# Patient Record
Sex: Male | Born: 1970
Health system: Southern US, Community
[De-identification: ages and names within clinical notes are randomized; demographics above are authoritative.]

## PROBLEM LIST (undated history)

## (undated) DIAGNOSIS — F71 Moderate intellectual disabilities: Secondary | ICD-10-CM

## (undated) DIAGNOSIS — R569 Unspecified convulsions: Secondary | ICD-10-CM

## (undated) DIAGNOSIS — E559 Vitamin D deficiency, unspecified: Secondary | ICD-10-CM

## (undated) DIAGNOSIS — T7840XA Allergy, unspecified, initial encounter: Secondary | ICD-10-CM

## (undated) HISTORY — DX: Allergy, unspecified, initial encounter: T78.40XA

## (undated) HISTORY — PX: TYMPANOSTOMY TUBE PLACEMENT: SHX32

## (undated) HISTORY — PX: PALATE SURGERY: SHX729

## (undated) HISTORY — PX: HERNIA REPAIR: SHX51

## (undated) HISTORY — DX: Moderate intellectual disabilities: F71

## (undated) HISTORY — PX: CLEFT LIP REPAIR: SUR1164

## (undated) HISTORY — DX: Unspecified convulsions: R56.9

## (undated) HISTORY — DX: Vitamin D deficiency, unspecified: E55.9

## (undated) HISTORY — PX: ANKLE SURGERY: SHX546

---

## 2005-10-23 ENCOUNTER — Emergency Department: Payer: Self-pay | Admitting: Emergency Medicine

## 2008-06-19 DIAGNOSIS — R7309 Other abnormal glucose: Secondary | ICD-10-CM | POA: Insufficient documentation

## 2012-07-21 DIAGNOSIS — G40909 Epilepsy, unspecified, not intractable, without status epilepticus: Secondary | ICD-10-CM | POA: Diagnosis not present

## 2012-07-21 DIAGNOSIS — R7309 Other abnormal glucose: Secondary | ICD-10-CM | POA: Diagnosis not present

## 2012-07-21 DIAGNOSIS — Z23 Encounter for immunization: Secondary | ICD-10-CM | POA: Diagnosis not present

## 2012-07-21 DIAGNOSIS — Z79899 Other long term (current) drug therapy: Secondary | ICD-10-CM | POA: Diagnosis not present

## 2012-07-21 DIAGNOSIS — E785 Hyperlipidemia, unspecified: Secondary | ICD-10-CM | POA: Diagnosis not present

## 2012-08-07 ENCOUNTER — Emergency Department: Payer: Self-pay | Admitting: Emergency Medicine

## 2012-08-07 DIAGNOSIS — M79609 Pain in unspecified limb: Secondary | ICD-10-CM | POA: Diagnosis not present

## 2012-08-07 DIAGNOSIS — M7989 Other specified soft tissue disorders: Secondary | ICD-10-CM | POA: Diagnosis not present

## 2012-08-07 DIAGNOSIS — S93609A Unspecified sprain of unspecified foot, initial encounter: Secondary | ICD-10-CM | POA: Diagnosis not present

## 2012-08-07 DIAGNOSIS — S9030XA Contusion of unspecified foot, initial encounter: Secondary | ICD-10-CM | POA: Diagnosis not present

## 2012-08-12 ENCOUNTER — Emergency Department: Payer: Self-pay | Admitting: Emergency Medicine

## 2012-08-12 DIAGNOSIS — M7989 Other specified soft tissue disorders: Secondary | ICD-10-CM | POA: Diagnosis not present

## 2012-08-12 DIAGNOSIS — S93609A Unspecified sprain of unspecified foot, initial encounter: Secondary | ICD-10-CM | POA: Diagnosis not present

## 2012-08-12 DIAGNOSIS — M79609 Pain in unspecified limb: Secondary | ICD-10-CM | POA: Diagnosis not present

## 2012-08-15 DIAGNOSIS — S93609A Unspecified sprain of unspecified foot, initial encounter: Secondary | ICD-10-CM | POA: Diagnosis not present

## 2013-01-17 DIAGNOSIS — E785 Hyperlipidemia, unspecified: Secondary | ICD-10-CM | POA: Diagnosis not present

## 2013-01-17 DIAGNOSIS — R21 Rash and other nonspecific skin eruption: Secondary | ICD-10-CM | POA: Diagnosis not present

## 2013-07-23 DIAGNOSIS — E785 Hyperlipidemia, unspecified: Secondary | ICD-10-CM | POA: Diagnosis not present

## 2013-07-23 DIAGNOSIS — F71 Moderate intellectual disabilities: Secondary | ICD-10-CM | POA: Diagnosis not present

## 2013-07-23 DIAGNOSIS — Z23 Encounter for immunization: Secondary | ICD-10-CM | POA: Diagnosis not present

## 2013-07-23 DIAGNOSIS — G40909 Epilepsy, unspecified, not intractable, without status epilepticus: Secondary | ICD-10-CM | POA: Diagnosis not present

## 2013-07-23 DIAGNOSIS — Z79899 Other long term (current) drug therapy: Secondary | ICD-10-CM | POA: Diagnosis not present

## 2013-07-23 DIAGNOSIS — E871 Hypo-osmolality and hyponatremia: Secondary | ICD-10-CM | POA: Diagnosis not present

## 2013-12-16 IMAGING — CR RIGHT FOOT COMPLETE - 3+ VIEW
1 series · 3 of 3 positions shown · non-contrast
Comparison: none

REASON FOR EXAM: foot pain and swelling
COMMENTS:

PROCEDURE:     DXR - DXR FOOT RT COMPLETE W/OBLIQUES  - August 07, 2012  [DATE]
RESULT:     Comparison: None.

[Series 1: x foot ap right · 0.14mm/px · 3 of 3 slices shown]
[im 1/3]
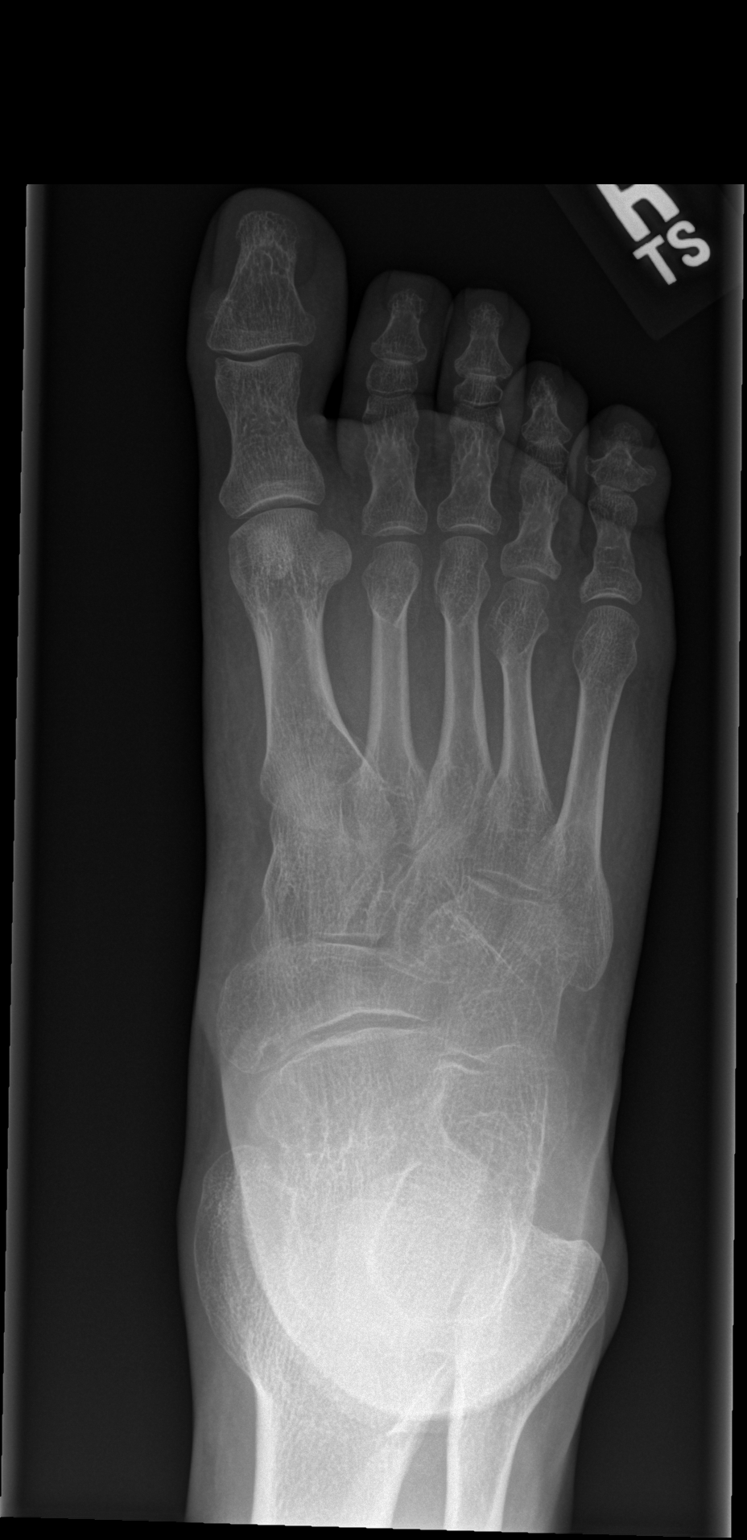
[im 2/3]
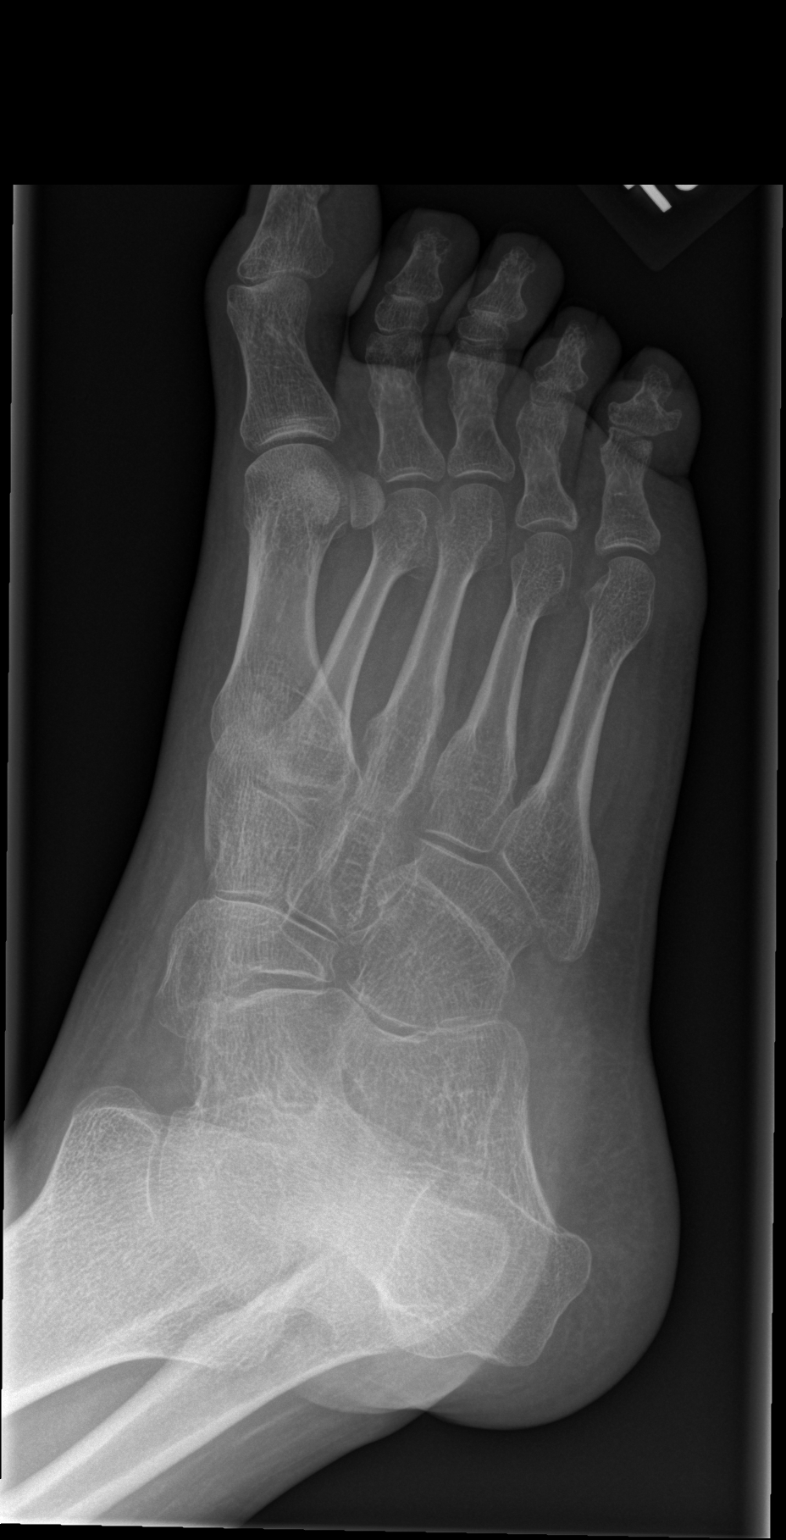
[im 3/3]
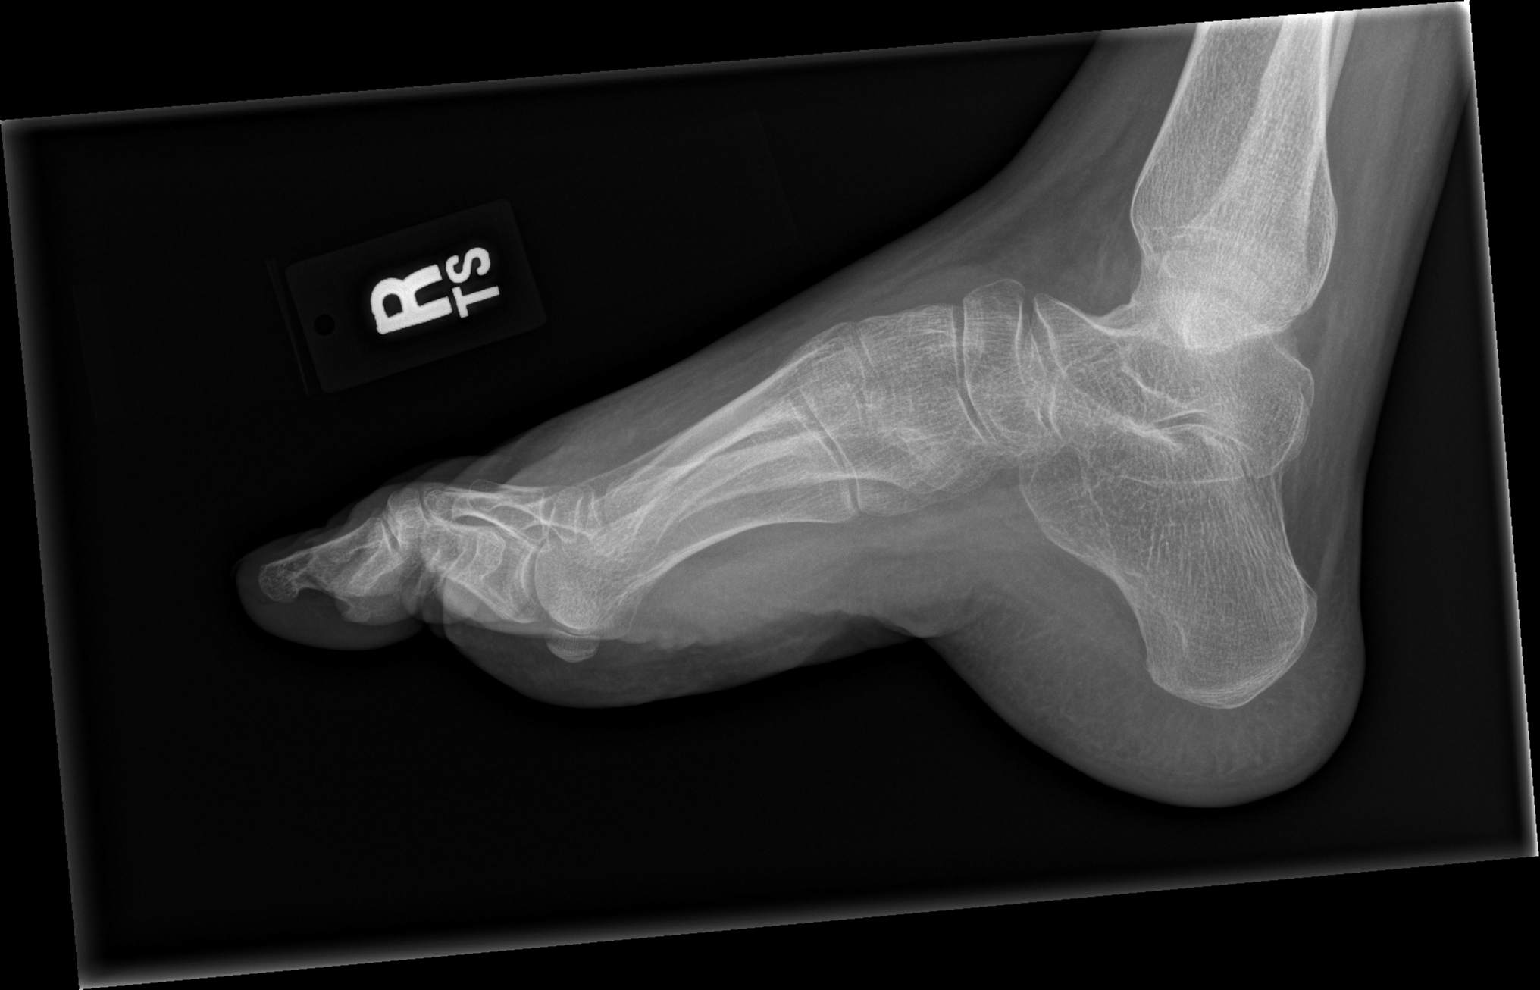

[3 of 3 positions shown; findings below may reference images not displayed]

FINDINGS: Relative osteopenia limits evaluation for fractures. No acute fracture seen.
There is Choi Ngan Moment.
IMPRESSION: No acute fracture seen.

[REDACTED]

## 2014-01-25 DIAGNOSIS — G40909 Epilepsy, unspecified, not intractable, without status epilepticus: Secondary | ICD-10-CM | POA: Diagnosis not present

## 2014-01-25 DIAGNOSIS — R7309 Other abnormal glucose: Secondary | ICD-10-CM | POA: Diagnosis not present

## 2014-01-25 DIAGNOSIS — F71 Moderate intellectual disabilities: Secondary | ICD-10-CM | POA: Diagnosis not present

## 2014-01-25 DIAGNOSIS — Z79899 Other long term (current) drug therapy: Secondary | ICD-10-CM | POA: Diagnosis not present

## 2014-01-25 DIAGNOSIS — E785 Hyperlipidemia, unspecified: Secondary | ICD-10-CM | POA: Diagnosis not present

## 2014-01-25 LAB — HEMOGLOBIN A1C: Hgb A1c MFr Bld: 5.4 % (ref 4.0–6.0)

## 2014-01-25 LAB — LIPID PANEL
Cholesterol: 216 mg/dL — AB (ref 0–200)
HDL: 42 mg/dL (ref 35–70)
LDL CALC: 140 mg/dL
TRIGLYCERIDES: 172 mg/dL — AB (ref 40–160)

## 2014-08-02 DIAGNOSIS — F71 Moderate intellectual disabilities: Secondary | ICD-10-CM | POA: Diagnosis not present

## 2014-08-02 DIAGNOSIS — Z79899 Other long term (current) drug therapy: Secondary | ICD-10-CM | POA: Diagnosis not present

## 2014-08-02 DIAGNOSIS — E785 Hyperlipidemia, unspecified: Secondary | ICD-10-CM | POA: Diagnosis not present

## 2014-08-02 DIAGNOSIS — E871 Hypo-osmolality and hyponatremia: Secondary | ICD-10-CM | POA: Diagnosis not present

## 2014-08-02 DIAGNOSIS — K120 Recurrent oral aphthae: Secondary | ICD-10-CM | POA: Diagnosis not present

## 2014-08-02 DIAGNOSIS — Z23 Encounter for immunization: Secondary | ICD-10-CM | POA: Diagnosis not present

## 2014-08-02 DIAGNOSIS — G40909 Epilepsy, unspecified, not intractable, without status epilepticus: Secondary | ICD-10-CM | POA: Diagnosis not present

## 2014-08-23 DIAGNOSIS — E871 Hypo-osmolality and hyponatremia: Secondary | ICD-10-CM | POA: Diagnosis not present

## 2014-08-23 DIAGNOSIS — G40909 Epilepsy, unspecified, not intractable, without status epilepticus: Secondary | ICD-10-CM | POA: Diagnosis not present

## 2014-08-23 DIAGNOSIS — R809 Proteinuria, unspecified: Secondary | ICD-10-CM | POA: Diagnosis not present

## 2014-08-23 DIAGNOSIS — E785 Hyperlipidemia, unspecified: Secondary | ICD-10-CM | POA: Diagnosis not present

## 2014-08-30 DIAGNOSIS — E871 Hypo-osmolality and hyponatremia: Secondary | ICD-10-CM | POA: Diagnosis not present

## 2014-08-30 DIAGNOSIS — E785 Hyperlipidemia, unspecified: Secondary | ICD-10-CM | POA: Diagnosis not present

## 2014-08-30 DIAGNOSIS — R809 Proteinuria, unspecified: Secondary | ICD-10-CM | POA: Diagnosis not present

## 2014-11-29 DIAGNOSIS — G40909 Epilepsy, unspecified, not intractable, without status epilepticus: Secondary | ICD-10-CM | POA: Diagnosis not present

## 2014-11-29 DIAGNOSIS — E785 Hyperlipidemia, unspecified: Secondary | ICD-10-CM | POA: Diagnosis not present

## 2014-11-29 DIAGNOSIS — E871 Hypo-osmolality and hyponatremia: Secondary | ICD-10-CM | POA: Diagnosis not present

## 2014-11-29 DIAGNOSIS — R809 Proteinuria, unspecified: Secondary | ICD-10-CM | POA: Diagnosis not present

## 2014-12-07 ENCOUNTER — Emergency Department: Payer: Self-pay | Admitting: Emergency Medicine

## 2014-12-07 DIAGNOSIS — S29011A Strain of muscle and tendon of front wall of thorax, initial encounter: Secondary | ICD-10-CM | POA: Diagnosis not present

## 2014-12-07 DIAGNOSIS — R0789 Other chest pain: Secondary | ICD-10-CM | POA: Diagnosis not present

## 2014-12-07 DIAGNOSIS — R0781 Pleurodynia: Secondary | ICD-10-CM | POA: Diagnosis not present

## 2015-01-15 DIAGNOSIS — J302 Other seasonal allergic rhinitis: Secondary | ICD-10-CM | POA: Diagnosis not present

## 2015-01-15 DIAGNOSIS — E871 Hypo-osmolality and hyponatremia: Secondary | ICD-10-CM | POA: Diagnosis not present

## 2015-01-15 DIAGNOSIS — F71 Moderate intellectual disabilities: Secondary | ICD-10-CM | POA: Diagnosis not present

## 2015-01-15 DIAGNOSIS — G40909 Epilepsy, unspecified, not intractable, without status epilepticus: Secondary | ICD-10-CM | POA: Diagnosis not present

## 2015-01-15 DIAGNOSIS — R7309 Other abnormal glucose: Secondary | ICD-10-CM | POA: Diagnosis not present

## 2015-04-15 ENCOUNTER — Telehealth: Payer: Self-pay

## 2015-04-15 DIAGNOSIS — G40909 Epilepsy, unspecified, not intractable, without status epilepticus: Secondary | ICD-10-CM

## 2015-04-15 MED ORDER — PHENOBARBITAL 64.8 MG PO TABS: 64.8000 mg | ORAL_TABLET | Freq: Every day | ORAL | Status: DC

## 2015-04-15 NOTE — Telephone Encounter (Signed)
Last appt 01/15/15

## 2015-05-13 ENCOUNTER — Other Ambulatory Visit: Payer: Self-pay | Admitting: Family Medicine

## 2015-05-14 ENCOUNTER — Other Ambulatory Visit: Payer: Self-pay | Admitting: Family Medicine

## 2015-05-14 NOTE — Telephone Encounter (Signed)
Patient requesting refill. 

## 2015-05-15 ENCOUNTER — Telehealth: Payer: Self-pay | Admitting: Family Medicine

## 2015-05-15 NOTE — Telephone Encounter (Signed)
Mom called stating she needs a call about sons RX. There seems to be some confusion.

## 2015-05-16 MED ORDER — PHENOBARBITAL 64.8 MG PO TABS: 64.8000 mg | ORAL_TABLET | Freq: Three times a day (TID) | ORAL | Status: DC

## 2015-05-16 NOTE — Telephone Encounter (Signed)
Left vm for mother to return my call regarding medication.

## 2015-05-16 NOTE — Telephone Encounter (Signed)
Patient mother called and states he takes the Phenobarbital 3x daily and the prescription was only called in for once daily, due to the error in prescription he only has enough to take for today. Could we please send a new prescription to his pharmacy.

## 2015-06-11 DIAGNOSIS — R809 Proteinuria, unspecified: Secondary | ICD-10-CM | POA: Diagnosis not present

## 2015-06-11 DIAGNOSIS — E871 Hypo-osmolality and hyponatremia: Secondary | ICD-10-CM | POA: Diagnosis not present

## 2015-06-11 DIAGNOSIS — E785 Hyperlipidemia, unspecified: Secondary | ICD-10-CM | POA: Diagnosis not present

## 2015-06-11 DIAGNOSIS — G40909 Epilepsy, unspecified, not intractable, without status epilepticus: Secondary | ICD-10-CM | POA: Diagnosis not present

## 2015-06-12 ENCOUNTER — Other Ambulatory Visit: Payer: Self-pay | Admitting: Family Medicine

## 2015-06-13 ENCOUNTER — Encounter: Payer: Self-pay | Admitting: Family Medicine

## 2015-06-13 ENCOUNTER — Other Ambulatory Visit: Payer: Self-pay

## 2015-06-13 DIAGNOSIS — E871 Hypo-osmolality and hyponatremia: Secondary | ICD-10-CM | POA: Insufficient documentation

## 2015-06-13 NOTE — Telephone Encounter (Signed)
This was sent by the pharmacy on accident and patient does not take this medication please cancel prescription.

## 2015-06-13 NOTE — Telephone Encounter (Signed)
Patient requesting refill. 

## 2015-06-13 NOTE — Telephone Encounter (Signed)
He was never on this medication. Please verify if it is for his mother

## 2015-07-15 ENCOUNTER — Other Ambulatory Visit: Payer: Self-pay | Admitting: Family Medicine

## 2015-07-15 NOTE — Telephone Encounter (Signed)
Patient requesting refill. 

## 2015-07-16 ENCOUNTER — Other Ambulatory Visit: Payer: Self-pay | Admitting: Family Medicine

## 2015-07-16 ENCOUNTER — Other Ambulatory Visit: Payer: Self-pay

## 2015-07-16 DIAGNOSIS — F79 Unspecified intellectual disabilities: Secondary | ICD-10-CM | POA: Insufficient documentation

## 2015-07-16 DIAGNOSIS — G40909 Epilepsy, unspecified, not intractable, without status epilepticus: Secondary | ICD-10-CM | POA: Insufficient documentation

## 2015-07-16 DIAGNOSIS — F71 Moderate intellectual disabilities: Secondary | ICD-10-CM

## 2015-07-16 DIAGNOSIS — E785 Hyperlipidemia, unspecified: Secondary | ICD-10-CM | POA: Insufficient documentation

## 2015-07-16 DIAGNOSIS — J302 Other seasonal allergic rhinitis: Secondary | ICD-10-CM

## 2015-07-16 NOTE — Telephone Encounter (Signed)
Patient states he only has enough for today and tomorrow, but is willing to come by today to get blood work today. Could you please print it and I will let them know when it is ready for pick up. Thanks

## 2015-07-16 NOTE — Telephone Encounter (Signed)
Called and left a message for his guardians to return my call.

## 2015-07-16 NOTE — Telephone Encounter (Signed)
Needs refills u stated did not need blood work? He has an appointment Friday but will run out tomorrow!

## 2015-07-16 NOTE — Telephone Encounter (Signed)
Please ask parents if he has enough until his visit. We need to recheck his labs

## 2015-07-18 ENCOUNTER — Encounter: Payer: Self-pay | Admitting: Family Medicine

## 2015-07-18 ENCOUNTER — Ambulatory Visit (INDEPENDENT_AMBULATORY_CARE_PROVIDER_SITE_OTHER): Payer: Medicare Other | Admitting: Family Medicine

## 2015-07-18 VITALS — BP 104/66 | HR 88 | Temp 98.2°F | Resp 16 | Ht 68.0 in | Wt 172.0 lb

## 2015-07-18 DIAGNOSIS — J302 Other seasonal allergic rhinitis: Secondary | ICD-10-CM

## 2015-07-18 DIAGNOSIS — G40909 Epilepsy, unspecified, not intractable, without status epilepticus: Secondary | ICD-10-CM | POA: Diagnosis not present

## 2015-07-18 DIAGNOSIS — Z23 Encounter for immunization: Secondary | ICD-10-CM

## 2015-07-18 DIAGNOSIS — E559 Vitamin D deficiency, unspecified: Secondary | ICD-10-CM | POA: Diagnosis not present

## 2015-07-18 DIAGNOSIS — F71 Moderate intellectual disabilities: Secondary | ICD-10-CM

## 2015-07-18 DIAGNOSIS — Z79899 Other long term (current) drug therapy: Secondary | ICD-10-CM | POA: Diagnosis not present

## 2015-07-18 DIAGNOSIS — R7309 Other abnormal glucose: Secondary | ICD-10-CM | POA: Diagnosis not present

## 2015-07-18 DIAGNOSIS — E785 Hyperlipidemia, unspecified: Secondary | ICD-10-CM

## 2015-07-18 MED ORDER — LORATADINE 10 MG PO TABS: 10.0000 mg | ORAL_TABLET | Freq: Every day | ORAL | Status: DC

## 2015-07-18 MED ORDER — CARBAMAZEPINE 200 MG PO TABS: 200.0000 mg | ORAL_TABLET | Freq: Every day | ORAL | Status: DC

## 2015-07-18 MED ORDER — PHENOBARBITAL 64.8 MG PO TABS: 64.8000 mg | ORAL_TABLET | Freq: Three times a day (TID) | ORAL | Status: DC

## 2015-07-18 NOTE — Progress Notes (Signed)
Name: Alec Campbell   MRN: [id number]    DOB: 05-26-1971   Date:07/18/2015       Progress Note  Subjective  Chief Complaint  Chief Complaint  Patient presents with  . Medication Refill    follow-up  . Allergic Rhinitis     sneezing,runny nose  . Dental Problem    needs statement stating ok for him to have general anthesia (for cavities and other problems)    HPI  AR: he still has nasal congestion and sniffles, he also sneezes, he is not taking anything otc at this time, mother would like something to control symptoms   Dental Cavities: mother states she needs a note to clear him for anesthesia. He had anesthesia in the past without complications. Explained that the dentist to send me a pre op request.   MR: he lives with his parents, he needs help with medication management, transportation and unable to manage his finances. Stable  Seizure disorder: no symptoms in many years, mother is terrified of stopping his medication because his symptoms were severe. He has hyponatremia and sees nephrologist  Dyslipidemia: we will recheck labs  Patient Active Problem List   Diagnosis Date Noted  . Moderately mentally retarded 07/16/2015  . Seizure disorder 07/16/2015  . Seasonal allergic rhinitis 07/16/2015  . Dyslipidemia 07/16/2015  . Hyponatremia 06/13/2015  . Abnormal blood sugar 06/19/2008    Past Surgical History  Procedure Laterality Date  . Hernia repair      Inguinal  . Ankle surgery    . Tympanostomy tube placement    . Cleft lip repair      Family History  Problem Relation Age of Onset  . Hypertension Mother   . CAD Father   . Hypertension Father   . Hyperlipidemia Father   . Diabetes Brother     Social History   Social History  . Marital Status: Single    Spouse Name: N/A  . Number of Children: N/A  . Years of Education: N/A   Occupational History  . Not on file.   Social History Main Topics  . Smoking status: Never Smoker   . Smokeless tobacco:  Never Used  . Alcohol Use: No  . Drug Use: No  . Sexual Activity: Not Currently   Other Topics Concern  . Not on file   Social History Narrative     Current outpatient prescriptions:  .  carbamazepine (TEGRETOL) 200 MG tablet, Take 1 tablet (200 mg total) by mouth 5 (five) times daily., Disp: 150 tablet, Rfl: 0 .  PHENobarbital (LUMINAL) 64.8 MG tablet, Take 1 tablet (64.8 mg total) by mouth 3 (three) times daily., Disp: 90 tablet, Rfl: 0  Not on File   ROS  Constitutional: Negative for fever or weight change.  Respiratory: Negative for cough and shortness of breath.   Cardiovascular: Negative for chest pain or palpitations.  Gastrointestinal: Negative for abdominal pain, no bowel changes.  Musculoskeletal: Negative for gait problem or joint swelling.  Skin: Negative for rash.  Neurological: Negative for dizziness or headache.  No other specific complaints in a complete review of systems (except as listed in HPI above).  Objective  Filed Vitals:   07/18/15 0929  BP: 104/66  Pulse: 88  Temp: 98.2 F (36.8 C)  TempSrc: Oral  Resp: 16  Height: 5\' 8"  (1.727 m)  Weight: 172 lb (78.019 kg)  SpO2: 98%    Body mass index is 26.16 kg/(m^2).  Physical Exam   Constitutional: Patient appears well-developed  and well-nourished.  No distress.  HEENT: he has a history of cleft palate, pupils equal and reactive to light, neck supple, throat within normal limits Cardiovascular: Normal rate, regular rhythm and normal heart sounds.  No murmur heard. No BLE edema. Pulmonary/Chest: Effort normal and breath sounds normal. No respiratory distress. Abdominal: Soft.  There is no tenderness. Psychiatric: Patient has a normal mood and affect. behavior is normal. Judgment and thought content normal.  PHQ2/9: Depression screen PHQ 2/9 07/18/2015  Decreased Interest 0  Down, Depressed, Hopeless 0  PHQ - 2 Score 0    Fall Risk: Fall Risk  07/18/2015  Falls in the past year? No     Functional Status Survey: Is the patient deaf or have difficulty hearing?: No Does the patient have difficulty seeing, even when wearing glasses/contacts?: No Does the patient have difficulty concentrating, remembering, or making decisions?: No Does the patient have difficulty walking or climbing stairs?: No Does the patient have difficulty dressing or bathing?: No Does the patient have difficulty doing errands alone such as visiting a doctor's office or shopping?: Yes (mentally unable)   Assessment & Plan  1. Seizure disorder  No episodes in years - Phenobarbital level - Carbamazepine Level (Tegretol), total  2. Needs flu shot  - Flu Vaccine QUAD 36+ mos PF IM (Fluarix & Fluzone Quad PF)  3. Seasonal allergic rhinitis  -loratadine 10mg  once daily #30  4. Moderately mentally retarded  Stable   5. Dyslipidemia  - Lipid panel  6. Abnormal blood sugar  - Hemoglobin A1c  7. Long-term use of high-risk medication  - Comprehensive metabolic panel - Vit D  25 hydroxy (rtn osteoporosis monitoring) - CBC with Differential/Platelet

## 2015-07-19 LAB — CBC WITH DIFFERENTIAL/PLATELET
BASOS ABS: 0 10*3/uL (ref 0.0–0.2)
Basos: 1 %
EOS (ABSOLUTE): 0.3 10*3/uL (ref 0.0–0.4)
Eos: 4 %
HEMOGLOBIN: 13 g/dL (ref 12.6–17.7)
Hematocrit: 37.2 % — ABNORMAL LOW (ref 37.5–51.0)
IMMATURE GRANS (ABS): 0 10*3/uL (ref 0.0–0.1)
Immature Granulocytes: 0 %
LYMPHS: 27 %
Lymphocytes Absolute: 1.9 10*3/uL (ref 0.7–3.1)
MCH: 29.3 pg (ref 26.6–33.0)
MCHC: 34.9 g/dL (ref 31.5–35.7)
MCV: 84 fL (ref 79–97)
MONOCYTES: 7 %
Monocytes Absolute: 0.5 10*3/uL (ref 0.1–0.9)
NEUTROS PCT: 61 %
Neutrophils Absolute: 4.4 10*3/uL (ref 1.4–7.0)
Platelets: 187 10*3/uL (ref 150–379)
RBC: 4.43 x10E6/uL (ref 4.14–5.80)
RDW: 13.6 % (ref 12.3–15.4)
WBC: 7.1 10*3/uL (ref 3.4–10.8)

## 2015-07-19 LAB — COMPREHENSIVE METABOLIC PANEL
ALT: 11 IU/L (ref 0–44)
AST: 10 IU/L (ref 0–40)
Albumin/Globulin Ratio: 2.1 (ref 1.1–2.5)
Albumin: 4.7 g/dL (ref 3.5–5.5)
Alkaline Phosphatase: 139 IU/L — ABNORMAL HIGH (ref 39–117)
BUN/Creatinine Ratio: 11 (ref 9–20)
BUN: 8 mg/dL (ref 6–24)
Bilirubin Total: 0.4 mg/dL (ref 0.0–1.2)
CALCIUM: 9.2 mg/dL (ref 8.7–10.2)
CO2: 24 mmol/L (ref 18–29)
Chloride: 94 mmol/L — ABNORMAL LOW (ref 97–108)
Creatinine, Ser: 0.76 mg/dL (ref 0.76–1.27)
GFR, EST AFRICAN AMERICAN: 127 mL/min/{1.73_m2} (ref >59)
GFR, EST NON AFRICAN AMERICAN: 110 mL/min/{1.73_m2} (ref >59)
GLUCOSE: 91 mg/dL (ref 65–99)
Globulin, Total: 2.2 g/dL (ref 1.5–4.5)
Potassium: 4.4 mmol/L (ref 3.5–5.2)
Sodium: 133 mmol/L — ABNORMAL LOW (ref 134–144)
TOTAL PROTEIN: 6.9 g/dL (ref 6.0–8.5)

## 2015-07-19 LAB — LIPID PANEL
CHOL/HDL RATIO: 3.9 ratio (ref 0.0–5.0)
Cholesterol, Total: 200 mg/dL — ABNORMAL HIGH (ref 100–199)
HDL: 51 mg/dL (ref >39)
LDL Calculated: 130 mg/dL — ABNORMAL HIGH (ref 0–99)
TRIGLYCERIDES: 95 mg/dL (ref 0–149)
VLDL Cholesterol Cal: 19 mg/dL (ref 5–40)

## 2015-07-19 LAB — CARBAMAZEPINE LEVEL, TOTAL: Carbamazepine Lvl: 8.6 ug/mL (ref 4.0–12.0)

## 2015-07-19 LAB — HEMOGLOBIN A1C
Est. average glucose Bld gHb Est-mCnc: 108 mg/dL
Hgb A1c MFr Bld: 5.4 % (ref 4.8–5.6)

## 2015-07-19 LAB — PHENOBARBITAL LEVEL: PHENOBARBITAL, SERUM: 39 ug/mL (ref 15–40)

## 2015-07-19 LAB — VITAMIN D 25 HYDROXY (VIT D DEFICIENCY, FRACTURES): VIT D 25 HYDROXY: 14.5 ng/mL — AB (ref 30.0–100.0)

## 2015-07-20 ENCOUNTER — Other Ambulatory Visit: Payer: Self-pay | Admitting: Family Medicine

## 2015-07-20 DIAGNOSIS — R569 Unspecified convulsions: Secondary | ICD-10-CM

## 2015-07-20 DIAGNOSIS — E559 Vitamin D deficiency, unspecified: Secondary | ICD-10-CM | POA: Insufficient documentation

## 2015-07-20 MED ORDER — VITAMIN D (ERGOCALCIFEROL) 1.25 MG (50000 UNIT) PO CAPS: 50000.0000 [IU] | ORAL_CAPSULE | ORAL | Status: DC

## 2015-07-21 NOTE — Progress Notes (Signed)
Pt mother notified.

## 2015-08-15 ENCOUNTER — Other Ambulatory Visit: Payer: Self-pay | Admitting: Family Medicine

## 2015-08-15 DIAGNOSIS — R569 Unspecified convulsions: Secondary | ICD-10-CM | POA: Diagnosis not present

## 2015-08-16 LAB — PHENOBARBITAL LEVEL: Phenobarbital, Serum: 25 ug/mL (ref 15–40)

## 2015-08-19 ENCOUNTER — Other Ambulatory Visit: Payer: Self-pay | Admitting: Family Medicine

## 2015-08-27 ENCOUNTER — Telehealth: Payer: Self-pay | Admitting: Family Medicine

## 2015-08-27 NOTE — Telephone Encounter (Signed)
Patient has appointment with dentist on this coming Monday and they are needing his last physical/office note and labs. [name] (mom) is willing to pick it up tomorrow or Friday. Please call mom once this his ready for pick up.

## 2015-08-28 NOTE — Telephone Encounter (Signed)
Patient notified paperwork ready for pick up

## 2015-12-18 ENCOUNTER — Other Ambulatory Visit: Payer: Self-pay | Admitting: Family Medicine

## 2015-12-26 DIAGNOSIS — E871 Hypo-osmolality and hyponatremia: Secondary | ICD-10-CM | POA: Diagnosis not present

## 2016-01-15 ENCOUNTER — Ambulatory Visit: Payer: Self-pay | Admitting: Family Medicine

## 2016-01-19 ENCOUNTER — Ambulatory Visit (INDEPENDENT_AMBULATORY_CARE_PROVIDER_SITE_OTHER): Payer: Medicare Other | Admitting: Family Medicine

## 2016-01-19 ENCOUNTER — Encounter: Payer: Self-pay | Admitting: Family Medicine

## 2016-01-19 VITALS — BP 114/66 | HR 82 | Temp 97.6°F | Resp 16 | Ht 68.0 in | Wt 172.0 lb

## 2016-01-19 DIAGNOSIS — Z79899 Other long term (current) drug therapy: Secondary | ICD-10-CM | POA: Diagnosis not present

## 2016-01-19 DIAGNOSIS — E785 Hyperlipidemia, unspecified: Secondary | ICD-10-CM

## 2016-01-19 DIAGNOSIS — R7309 Other abnormal glucose: Secondary | ICD-10-CM | POA: Diagnosis not present

## 2016-01-19 DIAGNOSIS — E559 Vitamin D deficiency, unspecified: Secondary | ICD-10-CM

## 2016-01-19 DIAGNOSIS — G40909 Epilepsy, unspecified, not intractable, without status epilepticus: Secondary | ICD-10-CM | POA: Diagnosis not present

## 2016-01-19 DIAGNOSIS — J302 Other seasonal allergic rhinitis: Secondary | ICD-10-CM

## 2016-01-19 DIAGNOSIS — F71 Moderate intellectual disabilities: Secondary | ICD-10-CM | POA: Diagnosis not present

## 2016-01-19 MED ORDER — VITAMIN D 50 MCG (2000 UT) PO CAPS: 1.0000 | ORAL_CAPSULE | Freq: Every day | ORAL | Status: DC

## 2016-01-19 NOTE — Progress Notes (Signed)
Name: Alec Campbell   MRN: [id number]    DOB: 08-Sep-1971   Date:01/19/2016       Progress Note  Subjective  Chief Complaint  Chief Complaint  Patient presents with  . Medication Refill    6 month F/U  . Seizures    Has not had one in a long time  . Mentally Retardation    Stable  . Allergic Rhinitis     Runny nose    HPI  AR: he still has nasal congestion and sniffles, at times he also sneezes, he takes Loratadine prn only . He is doing well at this time  MR: he lives with his parents, he needs help with medication management, transportation and unable to manage his finances.  Diagnosed as a toddler, with developmental delay.   Seizure disorder: no symptoms in many years, mother is terrified of stopping his medication because his symptoms were severe. He has hyponatremia and sees nephrologist . He is now on only two pill of phenobarbital for the past 6 months without recurrence of symptoms. We will recheck levels and consider going down on Carbamezapine also.   Vitamin D: he took the prescriptions but has not been taking supplementation since.   Hyperglycemia: last hgbA1C was back to normal. He denies polyphagia, polydipsia or polyuria   Patient Active Problem List   Diagnosis Date Noted  . Vitamin D deficiency 07/20/2015  . Moderately mentally retarded 07/16/2015  . Seizure disorder ([place]) 07/16/2015  . Seasonal allergic rhinitis 07/16/2015  . Dyslipidemia 07/16/2015  . Hyponatremia 06/13/2015  . Abnormal blood sugar 06/19/2008    Past Surgical History  Procedure Laterality Date  . Hernia repair      Inguinal  . Ankle surgery    . Tympanostomy tube placement    . Cleft lip repair      Family History  Problem Relation Age of Onset  . Hypertension Mother   . Thyroid disease Mother   . CAD Father   . Hypertension Father   . Hyperlipidemia Father   . Diabetes Brother     Social History   Social History  . Marital Status: Single    Spouse Name: N/A  . Number  of Children: N/A  . Years of Education: N/A   Occupational History  . Not on file.   Social History Main Topics  . Smoking status: Never Smoker   . Smokeless tobacco: Never Used  . Alcohol Use: No  . Drug Use: No  . Sexual Activity: Not Currently   Other Topics Concern  . Not on file   Social History Narrative     Current outpatient prescriptions:  .  EPITOL 200 MG tablet, TAKE ONE TABLET BY MOUTH FIVE TIMES DAILY, Disp: 150 tablet, Rfl: 0 .  loratadine (CLARITIN) 10 MG tablet, Take 1 tablet (10 mg total) by mouth daily., Disp: 30 tablet, Rfl: 5 .  PHENobarbital (LUMINAL) 64.8 MG tablet, TAKE ONE TABLET BY MOUTH TWICE DAILY, Disp: 60 tablet, Rfl: 0 .  PREVIDENT 5000 PLUS 1.1 % CREA dental cream, BRUSH ON TEETH TWICE A DAY -NO EATING OR DRINKING FOR 30 MINUTES AFTER USE, Disp: , Rfl: 5 .  Cholecalciferol (VITAMIN D) 2000 units CAPS, Take 1 capsule (2,000 Units total) by mouth daily., Disp: 30 capsule, Rfl: 0  No Known Allergies   ROS  Ten systems reviewed and is negative except as mentioned in HPI  Objective  Filed Vitals:   01/19/16 0945  BP: 114/66  Pulse: 82  Temp:  97.6 F (36.4 C)  TempSrc: Oral  Resp: 16  Height: 5\' 8"  (1.727 m)  Weight: 172 lb (78.019 kg)  SpO2: 96%    Body mass index is 26.16 kg/(m^2).  Physical Exam  Constitutional: Patient appears well-developed and well-nourished.  No distress.  HEENT: head atraumatic, normocephalic, pupils equal and reactive to light, neck supple, throat within normal limits Cardiovascular: Normal rate, regular rhythm and normal heart sounds.  No murmur heard. No BLE edema. Pulmonary/Chest: Effort normal and breath sounds normal. No respiratory distress. Abdominal: Soft.  There is no tenderness. Psychiatric: Patient has a normal mood and affect. behavior is normal. Judgment and thought content normal.  PHQ2/9: Depression screen [facility] 2/9 01/19/2016 07/18/2015  Decreased Interest 0 0  Down, Depressed, Hopeless 0 0   PHQ - 2 Score 0 0    Fall Risk: Fall Risk  01/19/2016 07/18/2015  Falls in the past year? No No    Functional Status Survey: Is the patient deaf or have difficulty hearing?: No Does the patient have difficulty seeing, even when wearing glasses/contacts?: No Does the patient have difficulty concentrating, remembering, or making decisions?: Yes (Mental Retarded) Does the patient have difficulty walking or climbing stairs?: No Does the patient have difficulty dressing or bathing?: No Does the patient have difficulty doing errands alone such as visiting a doctor's office or shopping?: Yes (Does not drive)    Assessment & Plan  1. Seizure disorder ([place])  We will recheck level and if needed we will decrease dose of Carbamazepine - Carbamazepine Level (Tegretol), total - Phenobarbital level  2. Seasonal allergic rhinitis  Continue prn Loratadine  3. Moderately mentally retarded  stable  4. Dyslipidemia  On life style modification only   5. Abnormal blood sugar  - Hemoglobin A1c  6. Long-term use of high-risk medication  - CBC with Differential/Platelet - Comprehensive metabolic panel - Carbamazepine Level (Tegretol), total - Phenobarbital level  7. Vitamin D deficiency  - VITAMIN D 25 Hydroxy (Vit-D Deficiency, Fractures)

## 2016-01-20 LAB — CBC WITH DIFFERENTIAL/PLATELET
BASOS ABS: 0 10*3/uL (ref 0.0–0.2)
BASOS: 1 %
EOS (ABSOLUTE): 0.3 10*3/uL (ref 0.0–0.4)
EOS: 5 %
Hematocrit: 37.4 % — ABNORMAL LOW (ref 37.5–51.0)
Hemoglobin: 13.2 g/dL (ref 12.6–17.7)
Immature Grans (Abs): 0 10*3/uL (ref 0.0–0.1)
Immature Granulocytes: 0 %
LYMPHS: 31 %
Lymphocytes Absolute: 2.1 10*3/uL (ref 0.7–3.1)
MCH: 29.9 pg (ref 26.6–33.0)
MCHC: 35.3 g/dL (ref 31.5–35.7)
MCV: 85 fL (ref 79–97)
MONOCYTES: 7 %
Monocytes Absolute: 0.4 10*3/uL (ref 0.1–0.9)
NEUTROS ABS: 3.8 10*3/uL (ref 1.4–7.0)
NEUTROS PCT: 56 %
PLATELETS: 177 10*3/uL (ref 150–379)
RBC: 4.42 x10E6/uL (ref 4.14–5.80)
RDW: 12.9 % (ref 12.3–15.4)
WBC: 6.6 10*3/uL (ref 3.4–10.8)

## 2016-01-20 LAB — VITAMIN D 25 HYDROXY (VIT D DEFICIENCY, FRACTURES): Vit D, 25-Hydroxy: 14.3 ng/mL — ABNORMAL LOW (ref 30.0–100.0)

## 2016-01-20 LAB — COMPREHENSIVE METABOLIC PANEL
A/G RATIO: 2 (ref 1.2–2.2)
ALK PHOS: 121 IU/L — AB (ref 39–117)
ALT: 10 IU/L (ref 0–44)
AST: 13 IU/L (ref 0–40)
Albumin: 4.5 g/dL (ref 3.5–5.5)
BILIRUBIN TOTAL: 0.3 mg/dL (ref 0.0–1.2)
BUN/Creatinine Ratio: 8 — ABNORMAL LOW (ref 9–20)
BUN: 6 mg/dL (ref 6–24)
CHLORIDE: 95 mmol/L — AB (ref 96–106)
CO2: 21 mmol/L (ref 18–29)
Calcium: 9.4 mg/dL (ref 8.7–10.2)
Creatinine, Ser: 0.71 mg/dL — ABNORMAL LOW (ref 0.76–1.27)
GFR calc Af Amer: 131 mL/min/{1.73_m2} (ref >59)
GFR calc non Af Amer: 113 mL/min/{1.73_m2} (ref >59)
GLUCOSE: 74 mg/dL (ref 65–99)
Globulin, Total: 2.3 g/dL (ref 1.5–4.5)
POTASSIUM: 3.9 mmol/L (ref 3.5–5.2)
Sodium: 135 mmol/L (ref 134–144)
Total Protein: 6.8 g/dL (ref 6.0–8.5)

## 2016-01-20 LAB — HEMOGLOBIN A1C
Est. average glucose Bld gHb Est-mCnc: 114 mg/dL
HEMOGLOBIN A1C: 5.6 % (ref 4.8–5.6)

## 2016-01-20 LAB — PHENOBARBITAL LEVEL: PHENOBARBITAL, SERUM: 32 ug/mL (ref 15–40)

## 2016-01-20 LAB — CARBAMAZEPINE LEVEL, TOTAL: CARBAMAZEPINE LVL: 8.5 ug/mL (ref 4.0–12.0)

## 2016-01-21 ENCOUNTER — Other Ambulatory Visit: Payer: Self-pay | Admitting: Family Medicine

## 2016-01-21 DIAGNOSIS — R569 Unspecified convulsions: Secondary | ICD-10-CM

## 2016-01-21 MED ORDER — CARBAMAZEPINE 200 MG PO TABS: 200.0000 mg | ORAL_TABLET | Freq: Four times a day (QID) | ORAL | Status: DC

## 2016-01-23 ENCOUNTER — Other Ambulatory Visit: Payer: Self-pay | Admitting: Family Medicine

## 2016-02-13 ENCOUNTER — Encounter: Payer: Self-pay | Admitting: Family Medicine

## 2016-02-13 ENCOUNTER — Ambulatory Visit
Admission: RE | Admit: 2016-02-13 | Discharge: 2016-02-13 | Disposition: A | Discharge status: Home / Self Care | Payer: Medicare Other | Source: Ambulatory Visit | Attending: Family Medicine | Admitting: Family Medicine

## 2016-02-13 ENCOUNTER — Ambulatory Visit (INDEPENDENT_AMBULATORY_CARE_PROVIDER_SITE_OTHER): Payer: Medicare Other | Admitting: Family Medicine

## 2016-02-13 VITALS — BP 116/68 | HR 93 | Temp 97.6°F | Resp 18 | Ht 68.0 in | Wt 170.0 lb

## 2016-02-13 DIAGNOSIS — R062 Wheezing: Secondary | ICD-10-CM

## 2016-02-13 DIAGNOSIS — R05 Cough: Secondary | ICD-10-CM | POA: Diagnosis not present

## 2016-02-13 DIAGNOSIS — B001 Herpesviral vesicular dermatitis: Secondary | ICD-10-CM | POA: Diagnosis not present

## 2016-02-13 DIAGNOSIS — R058 Other specified cough: Secondary | ICD-10-CM | POA: Insufficient documentation

## 2016-02-13 MED ORDER — ACYCLOVIR 5 % EX OINT: 1.0000 "application " | TOPICAL_OINTMENT | Freq: Every day | CUTANEOUS | Status: DC

## 2016-02-13 MED ORDER — BENZONATATE 200 MG PO CAPS: 200.0000 mg | ORAL_CAPSULE | Freq: Three times a day (TID) | ORAL | Status: DC | PRN

## 2016-02-13 MED ORDER — AZITHROMYCIN 250 MG PO TABS: ORAL_TABLET | ORAL | Status: DC

## 2016-02-13 NOTE — Progress Notes (Signed)
Name: Alec Campbell   MRN: [id number]    DOB: [birth year]   Date:02/13/2016       Progress Note  Subjective  Chief Complaint  Chief Complaint  Patient presents with  . Cough    Onset-1 week, Rattling cough, poor appetitie, fever blisters, fever of 101 at home, nasal congestion, states he has not felt well at all-symptoms are unchanged. Patient mother has tried mucinex dm and Robitussin with no relief.     Cough This is a new problem. The current episode started in the past 7 days. Associated symptoms include a fever and nasal congestion. Pertinent negatives include no ear congestion, headaches or sore throat. Treatments tried: Has used Mucinex DM and Robitussin with no relief.     Past Medical History  Diagnosis Date  . Allergy   . Moderate mental retardation     Past Surgical History  Procedure Laterality Date  . Hernia repair      Inguinal  . Ankle surgery    . Tympanostomy tube placement    . Cleft lip repair      Family History  Problem Relation Age of Onset  . Hypertension Mother   . Thyroid disease Mother   . CAD Father   . Hypertension Father   . Hyperlipidemia Father   . Diabetes Brother     Social History   Social History  . Marital Status: Single    Spouse Name: N/A  . Number of Children: N/A  . Years of Education: N/A   Occupational History  . Not on file.   Social History Main Topics  . Smoking status: Never Smoker   . Smokeless tobacco: Never Used  . Alcohol Use: No  . Drug Use: No  . Sexual Activity: Not Currently   Other Topics Concern  . Not on file   Social History Narrative     Current outpatient prescriptions:  .  carbamazepine (EPITOL) 200 MG tablet, Take 1 tablet (200 mg total) by mouth 4 (four) times daily., Disp: 120 tablet, Rfl: 0 .  Cholecalciferol (VITAMIN D) 2000 units CAPS, Take 1 capsule (2,000 Units total) by mouth daily., Disp: 30 capsule, Rfl: 0 .  loratadine (CLARITIN) 10 MG tablet, Take 1 tablet (10 mg total) by  mouth daily., Disp: 30 tablet, Rfl: 5 .  PHENobarbital (LUMINAL) 64.8 MG tablet, TAKE ONE TABLET BY MOUTH TWICE DAILY, Disp: 60 tablet, Rfl: 5 .  PREVIDENT 5000 PLUS 1.1 % CREA dental cream, BRUSH ON TEETH TWICE A DAY -NO EATING OR DRINKING FOR 30 MINUTES AFTER USE, Disp: , Rfl: 5  No Known Allergies   Review of Systems  Constitutional: Positive for fever.  HENT: Negative for sore throat.   Respiratory: Positive for cough.   Neurological: Negative for headaches.     Objective  Filed Vitals:   02/13/16 1028  BP: 116/68  Pulse: 93  Temp: 97.6 F (36.4 C)  TempSrc: Oral  Resp: 18  Height: 5\' 8"  (1.727 m)  Weight: 170 lb (77.111 kg)  SpO2: 96%    Physical Exam  Constitutional: He is well-developed, well-nourished, and in no distress.  HENT:  Mouth/Throat: Posterior oropharyngeal erythema present.  R. Ear canal with cerumen impaction, TM not visible. Mild posterior pharyngeal erythema, some mucus visible. papulo-pustular, crusted lesions on the lower lip   Neck: Neck supple.  Cardiovascular: Normal rate and regular rhythm.   Pulmonary/Chest: He has wheezes in the right lower field.  occasional wheezing in right lung field.  Neurological: He is  alert.  Nursing note and vitals reviewed.     Assessment & Plan  1. Cough with expectoration We'll start on antibiotic and antitussive therapy. - azithromycin (ZITHROMAX) 250 MG tablet; 2 tabs po x day 1, then 1 tab po q day x 4 days  Dispense: 6 tablet; Refill: 0 - benzonatate (TESSALON) 200 MG capsule; Take 1 capsule (200 mg total) by mouth 3 (three) times daily as needed for cough.  Dispense: 20 capsule; Refill: 0  2. Expiratory wheezing  - DG Chest 2 View; Future  3. Cold sore To be applied for 5-7 days. - acyclovir ointment (ZOVIRAX) 5 %; Apply 1 application topically 6 (six) times daily. Apply to affected area 6x /day  Dispense: 30 g; Refill: 0   Alec Campbell A[facility] 02/13/2016 11:07 AM

## 2016-02-19 ENCOUNTER — Other Ambulatory Visit: Payer: Self-pay | Admitting: Family Medicine

## 2016-02-20 NOTE — Telephone Encounter (Signed)
Patient requesting refill. 

## 2016-03-10 ENCOUNTER — Other Ambulatory Visit: Payer: Self-pay | Admitting: Family Medicine

## 2016-03-10 DIAGNOSIS — R569 Unspecified convulsions: Secondary | ICD-10-CM | POA: Diagnosis not present

## 2016-03-11 LAB — CARBAMAZEPINE LEVEL, TOTAL: CARBAMAZEPINE LVL: 7.6 ug/mL (ref 4.0–12.0)

## 2016-05-19 ENCOUNTER — Other Ambulatory Visit: Payer: Self-pay | Admitting: Family Medicine

## 2016-06-18 ENCOUNTER — Other Ambulatory Visit: Payer: Self-pay | Admitting: Family Medicine

## 2016-06-18 NOTE — Telephone Encounter (Signed)
Patient requesting refill of Epitol be sent to [facility] on [address]

## 2016-07-21 ENCOUNTER — Encounter: Payer: Self-pay | Admitting: Family Medicine

## 2016-07-21 ENCOUNTER — Ambulatory Visit (INDEPENDENT_AMBULATORY_CARE_PROVIDER_SITE_OTHER): Payer: Medicare Other | Admitting: Family Medicine

## 2016-07-21 VITALS — BP 118/68 | HR 97 | Temp 98.1°F | Resp 18 | Ht 68.0 in | Wt 172.5 lb

## 2016-07-21 DIAGNOSIS — E559 Vitamin D deficiency, unspecified: Secondary | ICD-10-CM | POA: Diagnosis not present

## 2016-07-21 DIAGNOSIS — E785 Hyperlipidemia, unspecified: Secondary | ICD-10-CM

## 2016-07-21 DIAGNOSIS — R7309 Other abnormal glucose: Secondary | ICD-10-CM | POA: Diagnosis not present

## 2016-07-21 DIAGNOSIS — Z79899 Other long term (current) drug therapy: Secondary | ICD-10-CM | POA: Diagnosis not present

## 2016-07-21 DIAGNOSIS — B001 Herpesviral vesicular dermatitis: Secondary | ICD-10-CM

## 2016-07-21 DIAGNOSIS — E871 Hypo-osmolality and hyponatremia: Secondary | ICD-10-CM

## 2016-07-21 DIAGNOSIS — J302 Other seasonal allergic rhinitis: Secondary | ICD-10-CM

## 2016-07-21 DIAGNOSIS — G40909 Epilepsy, unspecified, not intractable, without status epilepticus: Secondary | ICD-10-CM | POA: Diagnosis not present

## 2016-07-21 DIAGNOSIS — Z23 Encounter for immunization: Secondary | ICD-10-CM

## 2016-07-21 DIAGNOSIS — F71 Moderate intellectual disabilities: Secondary | ICD-10-CM | POA: Diagnosis not present

## 2016-07-21 DIAGNOSIS — L989 Disorder of the skin and subcutaneous tissue, unspecified: Secondary | ICD-10-CM

## 2016-07-21 MED ORDER — PHENOBARBITAL 64.8 MG PO TABS: 64.8000 mg | ORAL_TABLET | Freq: Two times a day (BID) | ORAL | 5 refills | Status: DC

## 2016-07-21 MED ORDER — CARBAMAZEPINE 200 MG PO TABS: 200.0000 mg | ORAL_TABLET | Freq: Four times a day (QID) | ORAL | 5 refills | Status: DC

## 2016-07-21 MED ORDER — VALACYCLOVIR HCL 1 G PO TABS: 1000.0000 mg | ORAL_TABLET | Freq: Two times a day (BID) | ORAL | 0 refills | Status: DC

## 2016-07-21 NOTE — Progress Notes (Signed)
Name: Alec Campbell   MRN: [id number]    DOB: [birth year]   Date:07/21/2016       Progress Note  Subjective  Chief Complaint  Chief Complaint  Patient presents with  . Medication Refill    6 month F/U  . Seizures  . Allergic Rhinitis     Runny nose   . MR    HPI  AR: he still has nasal congestion  at times he also sneezes, he takes Loratadine prn only . He has clear rhinorrhea today and mother gave him otc medication, no fever or chills  MR: he lives with his parents, he needs help with medication management, transportation and he is unable to manage his finances.  Diagnosed as a toddler with developmental delay.   Seizure disorder: no symptoms in many years, mother is terrified of stopping his medication because his symptoms were severe. He has hyponatremia and was seeing  nephrologist . He is now on only two pill of phenobarbital for the past 6 months without recurrence of symptoms. We will recheck levels and consider going down on Carbamezapine also.   Vitamin D: he took the prescriptions but has not been taking supplementation since.   Fever Blister: episodes once or less per month, uses topical medication otc, but it can last up to 2 weeks per episode and he rubs it all the time and sometimes it spreads  Hyperglycemia: last hgbA1C was back to normal. He denies polyphagia, polydipsia or polyuria  Dyslipidemia: he has normal HDL, LDL was 130, he avoids fried food and fast food, eats a lot of fruit and vegetables.  Skin: he has small nodules on left side of nose for about one year, but is getting red and bothering him, no obvious growth noticed.   Patient Active Problem List   Diagnosis Date Noted  . Cold sore 02/13/2016  . Vitamin D deficiency 07/20/2015  . Moderately mentally retarded 07/16/2015  . Seizure disorder ([place]) 07/16/2015  . Seasonal allergic rhinitis 07/16/2015  . Dyslipidemia 07/16/2015  . Hyponatremia 06/13/2015  . Abnormal blood sugar 06/19/2008     Past Surgical History:  Procedure Laterality Date  . ANKLE SURGERY    . CLEFT LIP REPAIR    . HERNIA REPAIR     Inguinal  . TYMPANOSTOMY TUBE PLACEMENT      Family History  Problem Relation Age of Onset  . Hypertension Mother   . Thyroid disease Mother   . CAD Father   . Hypertension Father   . Hyperlipidemia Father   . Diabetes Brother     Social History   Social History  . Marital status: Single    Spouse name: N/A  . Number of children: N/A  . Years of education: N/A   Occupational History  . Not on file.   Social History Main Topics  . Smoking status: Never Smoker  . Smokeless tobacco: Never Used  . Alcohol use No  . Drug use: No  . Sexual activity: Not Currently   Other Topics Concern  . Not on file   Social History Narrative  . No narrative on file     Current Outpatient Prescriptions:  .  carbamazepine (EPITOL) 200 MG tablet, Take 1 tablet (200 mg total) by mouth 4 (four) times daily., Disp: 120 tablet, Rfl: 5 .  chlorpheniramine (EQ CHLORTABS) 4 MG tablet, Take 4 mg by mouth once as needed for allergies., Disp: , Rfl:  .  PHENobarbital (LUMINAL) 64.8 MG tablet, Take 1 tablet (64.8 mg  total) by mouth 2 (two) times daily., Disp: 60 tablet, Rfl: 5 .  PREVIDENT 5000 PLUS 1.1 % CREA dental cream, BRUSH ON TEETH TWICE A DAY -NO EATING OR DRINKING FOR 30 MINUTES AFTER USE, Disp: , Rfl: 5  No Known Allergies   ROS  Constitutional: Negative for fever or weight change.  Respiratory: Negative for cough and shortness of breath.   Cardiovascular: Negative for chest pain or palpitations.  Gastrointestinal: Negative for abdominal pain, no bowel changes.  Musculoskeletal: Negative for gait problem or joint swelling.  Skin: Negative for rash. Skin lesion on face Neurological: Negative for dizziness or headache.  No other specific complaints in a complete review of systems (except as listed in HPI above).  Objective  Vitals:   07/21/16 0902  BP: 118/68   Pulse: 97  Resp: 18  Temp: 98.1 F (36.7 C)  TempSrc: Oral  SpO2: 95%  Weight: 172 lb 8 oz (78.2 kg)  Height: 5\' 8"  (1.727 m)    Body mass index is 26.23 kg/m.  Physical Exam  Constitutional: Patient appears well-developed and well-nourished.  No distress.  HEENT: head atraumatic, normocephalic, pupils equal and reactive to light,  neck supple, throat within normal limits Cardiovascular: Normal rate, regular rhythm and normal heart sounds.  No murmur heard. No BLE edema. Pulmonary/Chest: Effort normal and breath sounds normal. No respiratory distress. Abdominal: Soft.  There is no tenderness. Skin: small lesions on left side of nose, changing in color.  Psychiatric: Patient has a normal mood and affect. behavior is normal. Judgment and thought content normal.  PHQ2/9: Depression screen [facility] 2/9 07/21/2016 01/19/2016 07/18/2015  Decreased Interest 0 0 0  Down, Depressed, Hopeless 0 0 0  PHQ - 2 Score 0 0 0     Fall Risk: Fall Risk  07/21/2016 01/19/2016 07/18/2015  Falls in the past year? No No No     Functional Status Survey: Is the patient deaf or have difficulty hearing?: No Does the patient have difficulty seeing, even when wearing glasses/contacts?: No Does the patient have difficulty concentrating, remembering, or making decisions?: No Does the patient have difficulty walking or climbing stairs?: No Does the patient have difficulty dressing or bathing?: No Does the patient have difficulty doing errands alone such as visiting a doctor's office or shopping?: Yes (Patient does not drive)    Assessment & Plan  1. Seizure disorder ([facility])  Recheck levels - PHENobarbital (LUMINAL) 64.8 MG tablet; Take 1 tablet (64.8 mg total) by mouth 2 (two) times daily.  Dispense: 60 tablet; Refill: 5 - carbamazepine (EPITOL) 200 MG tablet; Take 1 tablet (200 mg total) by mouth 4 (four) times daily.  Dispense: 120 tablet; Refill: 5 - Phenobarbital level - Carbamazepine Level  (Tegretol), total  2. Needs flu shot  - Flu Vaccine QUAD 36+ mos PF IM (Fluarix & Fluzone Quad PF)  3. Seasonal allergic rhinitis  Continue loratadine   4. Moderately mentally retarded  Stable  5. Dyslipidemia  - Lipid panel  6. Abnormal blood sugar  - Hemoglobin A1c  7. Vitamin D deficiency  - VITAMIN D 25 Hydroxy (Vit-D Deficiency, Fractures)  8. Hyponatremia  - COMPLETE METABOLIC PANEL WITH GFR  9. Long-term use of high-risk medication  - CBC with Differential/Platelet - COMPLETE METABOLIC PANEL WITH GFR  10. Skin lesion of face  - Ambulatory referral to Dermatology   11. Cold sore  - valACYclovir (VALTREX) 1000 MG tablet; Take 1 tablet (1,000 mg total) by mouth 2 (two) times daily. Prn outbreak  Dispense: 30 tablet; Refill: 0

## 2016-07-27 ENCOUNTER — Other Ambulatory Visit: Payer: Self-pay | Admitting: Family Medicine

## 2016-07-27 DIAGNOSIS — R7309 Other abnormal glucose: Secondary | ICD-10-CM | POA: Diagnosis not present

## 2016-07-27 DIAGNOSIS — E871 Hypo-osmolality and hyponatremia: Secondary | ICD-10-CM | POA: Diagnosis not present

## 2016-07-27 DIAGNOSIS — E785 Hyperlipidemia, unspecified: Secondary | ICD-10-CM | POA: Diagnosis not present

## 2016-07-27 DIAGNOSIS — Z79899 Other long term (current) drug therapy: Secondary | ICD-10-CM | POA: Diagnosis not present

## 2016-07-27 DIAGNOSIS — E559 Vitamin D deficiency, unspecified: Secondary | ICD-10-CM | POA: Diagnosis not present

## 2016-07-27 DIAGNOSIS — G40909 Epilepsy, unspecified, not intractable, without status epilepticus: Secondary | ICD-10-CM | POA: Diagnosis not present

## 2016-07-28 ENCOUNTER — Other Ambulatory Visit: Payer: Self-pay | Admitting: Family Medicine

## 2016-07-28 ENCOUNTER — Telehealth: Payer: Self-pay | Admitting: Family Medicine

## 2016-07-28 DIAGNOSIS — E559 Vitamin D deficiency, unspecified: Secondary | ICD-10-CM

## 2016-07-28 LAB — HGB A1C W/O EAG: Hgb A1c MFr Bld: 5.1 % (ref 4.8–5.6)

## 2016-07-28 LAB — PHENOBARBITAL LEVEL: PHENOBARBITAL, SERUM: 30 ug/mL (ref 15–40)

## 2016-07-28 LAB — LIPID PANEL WITH LDL/HDL RATIO
CHOLESTEROL TOTAL: 214 mg/dL — AB (ref 100–199)
HDL: 42 mg/dL (ref >39)
LDL Calculated: 141 mg/dL — ABNORMAL HIGH (ref 0–99)
LDl/HDL Ratio: 3.4 ratio units (ref 0.0–3.6)
TRIGLYCERIDES: 153 mg/dL — AB (ref 0–149)
VLDL Cholesterol Cal: 31 mg/dL (ref 5–40)

## 2016-07-28 LAB — CBC WITH DIFFERENTIAL/PLATELET
BASOS: 1 %
Basophils Absolute: 0.1 10*3/uL (ref 0.0–0.2)
EOS (ABSOLUTE): 0.3 10*3/uL (ref 0.0–0.4)
Eos: 4 %
Hematocrit: 37.5 % (ref 37.5–51.0)
Hemoglobin: 13 g/dL (ref 12.6–17.7)
IMMATURE GRANULOCYTES: 0 %
Immature Grans (Abs): 0 10*3/uL (ref 0.0–0.1)
LYMPHS ABS: 1.7 10*3/uL (ref 0.7–3.1)
Lymphs: 28 %
MCH: 29.1 pg (ref 26.6–33.0)
MCHC: 34.7 g/dL (ref 31.5–35.7)
MCV: 84 fL (ref 79–97)
MONOS ABS: 0.4 10*3/uL (ref 0.1–0.9)
Monocytes: 6 %
NEUTROS PCT: 61 %
Neutrophils Absolute: 3.6 10*3/uL (ref 1.4–7.0)
PLATELETS: 183 10*3/uL (ref 150–379)
RBC: 4.47 x10E6/uL (ref 4.14–5.80)
RDW: 13.7 % (ref 12.3–15.4)
WBC: 6 10*3/uL (ref 3.4–10.8)

## 2016-07-28 LAB — COMPREHENSIVE METABOLIC PANEL
A/G RATIO: 2 (ref 1.2–2.2)
ALK PHOS: 131 IU/L — AB (ref 39–117)
ALT: 12 IU/L (ref 0–44)
AST: 13 IU/L (ref 0–40)
Albumin: 4.7 g/dL (ref 3.5–5.5)
BILIRUBIN TOTAL: 0.3 mg/dL (ref 0.0–1.2)
BUN/Creatinine Ratio: 12 (ref 9–20)
BUN: 10 mg/dL (ref 6–24)
CALCIUM: 9.4 mg/dL (ref 8.7–10.2)
CHLORIDE: 96 mmol/L (ref 96–106)
CO2: 24 mmol/L (ref 18–29)
Creatinine, Ser: 0.84 mg/dL (ref 0.76–1.27)
GFR calc Af Amer: 121 mL/min/{1.73_m2} (ref >59)
GFR calc non Af Amer: 105 mL/min/{1.73_m2} (ref >59)
Globulin, Total: 2.4 g/dL (ref 1.5–4.5)
Glucose: 86 mg/dL (ref 65–99)
POTASSIUM: 4.3 mmol/L (ref 3.5–5.2)
Sodium: 133 mmol/L — ABNORMAL LOW (ref 134–144)
Total Protein: 7.1 g/dL (ref 6.0–8.5)

## 2016-07-28 LAB — CARBAMAZEPINE LEVEL, TOTAL: Carbamazepine (Tegretol), S: 8.8 ug/mL (ref 4.0–12.0)

## 2016-07-28 LAB — VITAMIN D 25 HYDROXY (VIT D DEFICIENCY, FRACTURES): VIT D 25 HYDROXY: 16.7 ng/mL — AB (ref 30.0–100.0)

## 2016-07-28 MED ORDER — VITAMIN D (ERGOCALCIFEROL) 1.25 MG (50000 UNIT) PO CAPS: 50000.0000 [IU] | ORAL_CAPSULE | ORAL | 0 refills | Status: DC

## 2016-07-28 NOTE — Telephone Encounter (Signed)
[name] (mother) is returning your call. Feels that it has something to do with results. States that if she does not answer when you call back that it is okay to leave detailed message on the machine.

## 2016-09-13 ENCOUNTER — Ambulatory Visit (INDEPENDENT_AMBULATORY_CARE_PROVIDER_SITE_OTHER): Payer: Medicare Other | Admitting: Family Medicine

## 2016-09-13 ENCOUNTER — Encounter: Payer: Self-pay | Admitting: Family Medicine

## 2016-09-13 VITALS — BP 122/76 | HR 102 | Temp 98.0°F | Resp 16 | Ht 68.0 in | Wt 169.1 lb

## 2016-09-13 DIAGNOSIS — R05 Cough: Secondary | ICD-10-CM | POA: Diagnosis not present

## 2016-09-13 DIAGNOSIS — J069 Acute upper respiratory infection, unspecified: Secondary | ICD-10-CM | POA: Diagnosis not present

## 2016-09-13 DIAGNOSIS — B9789 Other viral agents as the cause of diseases classified elsewhere: Secondary | ICD-10-CM | POA: Diagnosis not present

## 2016-09-13 DIAGNOSIS — R059 Cough, unspecified: Secondary | ICD-10-CM

## 2016-09-13 MED ORDER — FLUTICASONE FUROATE-VILANTEROL 100-25 MCG/INH IN AEPB
1.0000 | INHALATION_SPRAY | Freq: Every day | RESPIRATORY_TRACT | 0 refills | Status: DC
Start: 2016-09-13 — End: 2017-01-19

## 2016-09-13 MED ORDER — AZITHROMYCIN 250 MG PO TABS: ORAL_TABLET | ORAL | 0 refills | Status: DC

## 2016-09-13 NOTE — Progress Notes (Signed)
Name: Alec Campbell   MRN: [id number]    DOB: 10-11-1971   Date:09/13/2016       Progress Note  Subjective  Chief Complaint  Chief Complaint  Patient presents with  . Cough    pt has had a productive cough for 2 weeks which is not getting any better. No apetite Taking OTC cough meds    HPI  URI/cough: he started with rhinorrhea and nasal congestion, followed by fever, decrease in appetite and a cough is now wet and going on for the past two weeks. He has special needs and mother is worried because he can't expectorate the mucus. He has normal level of activity.    Patient Active Problem List   Diagnosis Date Noted  . Cold sore 02/13/2016  . Vitamin D deficiency 07/20/2015  . Moderately mentally retarded 07/16/2015  . Seizure disorder ([place]) 07/16/2015  . Seasonal allergic rhinitis 07/16/2015  . Dyslipidemia 07/16/2015  . Hyponatremia 06/13/2015  . Abnormal blood sugar 06/19/2008    Past Surgical History:  Procedure Laterality Date  . ANKLE SURGERY    . CLEFT LIP REPAIR    . HERNIA REPAIR     Inguinal  . TYMPANOSTOMY TUBE PLACEMENT      Family History  Problem Relation Age of Onset  . Hypertension Mother   . Thyroid disease Mother   . CAD Father   . Hypertension Father   . Hyperlipidemia Father   . Diabetes Brother     Social History   Social History  . Marital status: Single    Spouse name: N/A  . Number of children: N/A  . Years of education: N/A   Occupational History  . Not on file.   Social History Main Topics  . Smoking status: Never Smoker  . Smokeless tobacco: Never Used  . Alcohol use No  . Drug use: No  . Sexual activity: Not Currently   Other Topics Concern  . Not on file   Social History Narrative  . No narrative on file     Current Outpatient Prescriptions:  .  azithromycin (ZITHROMAX Z-PAK) 250 MG tablet, Take as directed, Disp: 6 each, Rfl: 0 .  carbamazepine (EPITOL) 200 MG tablet, Take 1 tablet (200 mg total) by mouth 4 (four)  times daily., Disp: 120 tablet, Rfl: 5 .  chlorpheniramine (EQ CHLORTABS) 4 MG tablet, Take 4 mg by mouth once as needed for allergies., Disp: , Rfl:  .  fluticasone furoate-vilanterol (BREO ELLIPTA) 100-25 MCG/INH AEPB, Inhale 1 puff into the lungs daily., Disp: 60 each, Rfl: 0 .  PHENobarbital (LUMINAL) 64.8 MG tablet, Take 1 tablet (64.8 mg total) by mouth 2 (two) times daily., Disp: 60 tablet, Rfl: 5 .  PREVIDENT 5000 PLUS 1.1 % CREA dental cream, BRUSH ON TEETH TWICE A DAY -NO EATING OR DRINKING FOR 30 MINUTES AFTER USE, Disp: , Rfl: 5 .  valACYclovir (VALTREX) 1000 MG tablet, Take 1 tablet (1,000 mg total) by mouth 2 (two) times daily. Prn outbreak, Disp: 30 tablet, Rfl: 0 .  Vitamin D, Ergocalciferol, (DRISDOL) 50000 units CAPS capsule, Take 1 capsule (50,000 Units total) by mouth every 7 (seven) days., Disp: 12 capsule, Rfl: 0  No Known Allergies   ROS  Ten systems reviewed and is negative except as mentioned in HPI   Objective  Vitals:   09/13/16 1456  BP: 122/76  Pulse: (!) 102  Resp: 16  Temp: 98 F (36.7 C)  TempSrc: Oral  SpO2: 94%  Weight: 169 lb 1  oz (76.7 kg)  Height: 5\' 8"  (1.727 m)    Body mass index is 25.71 kg/m.  Physical Exam  Constitutional: Patient appears well-developed and well-nourished.  No distress.  HEENT: head atraumatic, normocephalic, pupils equal and reactive to light, ears : cerumen impaction right side, normal on right side, boggy turbinates and clear rhinorrhea,  neck supple, throat within normal limits Cardiovascular: Normal rate, regular rhythm and normal heart sounds.  No murmur heard. No BLE edema. Pulmonary/Chest: Effort normal but he has scattered rhonchi. No respiratory distress. Abdominal: Soft.  There is no tenderness. Psychiatric: Patient has a normal mood and affect. behavior is normal. Judgment and thought content normal.  Recent Results (from the past 2160 hour(s))  CBC with Differential/Platelet     Status: None    Collection Time: 07/27/16 11:48 AM  Result Value Ref Range   WBC 6.0 3.4 - 10.8 x10E3/uL   RBC 4.47 4.14 - 5.80 x10E6/uL   Hemoglobin 13.0 12.6 - 17.7 g/dL   Hematocrit 37.5 37.5 - 51.0 %   MCV 84 79 - 97 fL   MCH 29.1 26.6 - 33.0 pg   MCHC 34.7 31.5 - 35.7 g/dL   RDW 13.7 12.3 - 15.4 %   Platelets 183 150 - 379 x10E3/uL   Neutrophils 61 %   Lymphs 28 %   Monocytes 6 %   Eos 4 %   Basos 1 %   Neutrophils Absolute 3.6 1.4 - 7.0 x10E3/uL   Lymphocytes Absolute 1.7 0.7 - 3.1 x10E3/uL   Monocytes Absolute 0.4 0.1 - 0.9 x10E3/uL   EOS (ABSOLUTE) 0.3 0.0 - 0.4 x10E3/uL   Basophils Absolute 0.1 0.0 - 0.2 x10E3/uL   Immature Granulocytes 0 %   Immature Grans (Abs) 0.0 0.0 - 0.1 x10E3/uL  Comprehensive metabolic panel     Status: Abnormal   Collection Time: 07/27/16 11:48 AM  Result Value Ref Range   Glucose 86 65 - 99 mg/dL   BUN 10 6 - 24 mg/dL   Creatinine, Ser 0.84 0.76 - 1.27 mg/dL   GFR calc non Af Amer 105 >59 mL/min/1.73   GFR calc Af Amer 121 >59 mL/min/1.73   BUN/Creatinine Ratio 12 9 - 20   Sodium 133 (L) 134 - 144 mmol/L   Potassium 4.3 3.5 - 5.2 mmol/L   Chloride 96 96 - 106 mmol/L   CO2 24 18 - 29 mmol/L   Calcium 9.4 8.7 - 10.2 mg/dL   Total Protein 7.1 6.0 - 8.5 g/dL   Albumin 4.7 3.5 - 5.5 g/dL   Globulin, Total 2.4 1.5 - 4.5 g/dL   Albumin/Globulin Ratio 2.0 1.2 - 2.2   Bilirubin Total 0.3 0.0 - 1.2 mg/dL   Alkaline Phosphatase 131 (H) 39 - 117 IU/L   AST 13 0 - 40 IU/L   ALT 12 0 - 44 IU/L  Lipid Panel With LDL/HDL Ratio     Status: Abnormal   Collection Time: 07/27/16 11:48 AM  Result Value Ref Range   Cholesterol, Total 214 (H) 100 - 199 mg/dL   Triglycerides 153 (H) 0 - 149 mg/dL   HDL 42 >39 mg/dL   VLDL Cholesterol Cal 31 5 - 40 mg/dL   LDL Calculated 141 (H) 0 - 99 mg/dL   LDl/HDL Ratio 3.4 0.0 - 3.6 ratio units    Comment:  LDL/HDL Ratio                                             Men  Women                                1/2 Avg.Risk  1.0    1.5                                   Avg.Risk  3.6    3.2                                2X Avg.Risk  6.2    5.0                                3X Avg.Risk  8.0    6.1   Hgb A1c w/o eAG     Status: None   Collection Time: 07/27/16 11:48 AM  Result Value Ref Range   Hgb A1c MFr Bld 5.1 4.8 - 5.6 %    Comment:          Pre-diabetes: 5.7 - 6.4          Diabetes: >6.4          Glycemic control for adults with diabetes: <7.0   Carbamazepine level, total     Status: None   Collection Time: 07/27/16 11:48 AM  Result Value Ref Range   Carbamazepine (Tegretol), S 8.8 4.0 - 12.0 ug/mL    Comment:          In conjunction with other antiepileptic drugs                                Therapeutic  4.0 -  8.0                                Toxicity     9.0 - 12.0                                    Carbamazepine alone                                Therapeutic  8.0 - 12.0                                 Detection Limit =  2.0                           <2.0 indicated None Detected   Phenobarbital level     Status: None   Collection Time: 07/27/16 11:48 AM  Result Value Ref Range   Phenobarbital, Serum 30 15 - 40 ug/mL    Comment:  Detection Limit = 3  VITAMIN D 25 Hydroxy (Vit-D Deficiency, Fractures)     Status: Abnormal   Collection Time: 07/27/16 11:48 AM  Result Value Ref Range   Vit D, 25-Hydroxy 16.7 (L) 30.0 - 100.0 ng/mL    Comment: Vitamin D deficiency has been defined by the [place] practice guideline as a level of serum 25-OH vitamin D less than 20 ng/mL (1,2). The Endocrine Society went on to further define vitamin D insufficiency as a level between 21 and 29 ng/mL (2). 1. IOM (Institute of Medicine). 2010. Dietary reference    intakes for calcium and D. [place]: The    [employer]. 2. Holick MF, Binkley [place], Bischoff-Ferrari HA, et al.    Evaluation,  treatment, and prevention of vitamin D    deficiency: an Endocrine Society clinical practice    guideline. JCEM. 2011 Jul; 96(7):1911-30.      PHQ2/9: Depression screen [facility] 2/9 07/21/2016 01/19/2016 07/18/2015  Decreased Interest 0 0 0  Down, Depressed, Hopeless 0 0 0  PHQ - 2 Score 0 0 0    Fall Risk: Fall Risk  07/21/2016 01/19/2016 07/18/2015  Falls in the past year? No No No     Assessment & Plan  1. Cough  Possible bronchitis, we will add z-pack because unable to expectorate and try breo also.  - azithromycin (ZITHROMAX Z-PAK) 250 MG tablet; Take as directed  Dispense: 6 each; Refill: 0 - fluticasone furoate-vilanterol (BREO ELLIPTA) 100-25 MCG/INH AEPB; Inhale 1 puff into the lungs daily.  Dispense: 60 each; Refill: 0  2. Viral upper respiratory tract infection  Take otc medication

## 2016-09-20 ENCOUNTER — Telehealth: Payer: Self-pay

## 2016-09-20 DIAGNOSIS — J302 Other seasonal allergic rhinitis: Secondary | ICD-10-CM

## 2016-09-20 DIAGNOSIS — H9203 Otalgia, bilateral: Secondary | ICD-10-CM

## 2016-09-20 NOTE — Telephone Encounter (Signed)
Patient mother Alec Campbell states [id number] ear is still bothering him. On his last visit Dr. Ancil Boozer stated he could be referral to [facility] if his problem continued. Please put the referral in for his ear problems. Thanks.

## 2016-09-27 DIAGNOSIS — H7202 Central perforation of tympanic membrane, left ear: Secondary | ICD-10-CM | POA: Diagnosis not present

## 2016-09-27 DIAGNOSIS — H6121 Impacted cerumen, right ear: Secondary | ICD-10-CM | POA: Diagnosis not present

## 2016-09-27 DIAGNOSIS — H6980 Other specified disorders of Eustachian tube, unspecified ear: Secondary | ICD-10-CM | POA: Diagnosis not present

## 2016-12-20 DIAGNOSIS — L739 Follicular disorder, unspecified: Secondary | ICD-10-CM | POA: Diagnosis not present

## 2016-12-20 DIAGNOSIS — D2239 Melanocytic nevi of other parts of face: Secondary | ICD-10-CM | POA: Diagnosis not present

## 2016-12-20 DIAGNOSIS — D485 Neoplasm of uncertain behavior of skin: Secondary | ICD-10-CM | POA: Diagnosis not present

## 2017-01-16 ENCOUNTER — Other Ambulatory Visit: Payer: Self-pay | Admitting: Family Medicine

## 2017-01-16 DIAGNOSIS — G40909 Epilepsy, unspecified, not intractable, without status epilepticus: Secondary | ICD-10-CM

## 2017-01-17 ENCOUNTER — Other Ambulatory Visit: Payer: Self-pay | Admitting: Family Medicine

## 2017-01-17 DIAGNOSIS — G40909 Epilepsy, unspecified, not intractable, without status epilepticus: Secondary | ICD-10-CM

## 2017-01-17 NOTE — Telephone Encounter (Signed)
Pt needs refill on Phenobarbital to be sent to [facility]

## 2017-01-19 ENCOUNTER — Ambulatory Visit (INDEPENDENT_AMBULATORY_CARE_PROVIDER_SITE_OTHER): Payer: Medicare Other | Admitting: Family Medicine

## 2017-01-19 ENCOUNTER — Encounter: Payer: Self-pay | Admitting: Family Medicine

## 2017-01-19 VITALS — BP 118/74 | HR 88 | Temp 98.2°F | Resp 18 | Ht 68.0 in | Wt 171.0 lb

## 2017-01-19 DIAGNOSIS — R7309 Other abnormal glucose: Secondary | ICD-10-CM | POA: Diagnosis not present

## 2017-01-19 DIAGNOSIS — G40909 Epilepsy, unspecified, not intractable, without status epilepticus: Secondary | ICD-10-CM

## 2017-01-19 DIAGNOSIS — Z79899 Other long term (current) drug therapy: Secondary | ICD-10-CM

## 2017-01-19 DIAGNOSIS — E785 Hyperlipidemia, unspecified: Secondary | ICD-10-CM | POA: Diagnosis not present

## 2017-01-19 DIAGNOSIS — Z0000 Encounter for general adult medical examination without abnormal findings: Secondary | ICD-10-CM | POA: Diagnosis not present

## 2017-01-19 DIAGNOSIS — E559 Vitamin D deficiency, unspecified: Secondary | ICD-10-CM

## 2017-01-19 LAB — CBC WITH DIFFERENTIAL/PLATELET
BASOS PCT: 1 %
Basophils Absolute: 61 cells/uL (ref 0–200)
Eosinophils Absolute: 366 cells/uL (ref 15–500)
Eosinophils Relative: 6 %
HCT: 39.8 % (ref 38.5–50.0)
Hemoglobin: 13.5 g/dL (ref 13.2–17.1)
LYMPHS ABS: 2135 {cells}/uL (ref 850–3900)
LYMPHS PCT: 35 %
MCH: 29.3 pg (ref 27.0–33.0)
MCHC: 33.9 g/dL (ref 32.0–36.0)
MCV: 86.5 fL (ref 80.0–100.0)
MONOS PCT: 7 %
MPV: 12.1 fL (ref 7.5–12.5)
Monocytes Absolute: 427 cells/uL (ref 200–950)
NEUTROS ABS: 3111 {cells}/uL (ref 1500–7800)
Neutrophils Relative %: 51 %
PLATELETS: 169 10*3/uL (ref 140–400)
RBC: 4.6 MIL/uL (ref 4.20–5.80)
RDW: 13.5 % (ref 11.0–15.0)
WBC: 6.1 10*3/uL (ref 3.8–10.8)

## 2017-01-19 LAB — COMPLETE METABOLIC PANEL WITH GFR
ALK PHOS: 116 U/L — AB (ref 40–115)
ALT: 11 U/L (ref 9–46)
AST: 14 U/L (ref 10–40)
Albumin: 4.5 g/dL (ref 3.6–5.1)
BILIRUBIN TOTAL: 0.3 mg/dL (ref 0.2–1.2)
BUN: 12 mg/dL (ref 7–25)
CALCIUM: 9.6 mg/dL (ref 8.6–10.3)
CO2: 28 mmol/L (ref 20–31)
Chloride: 99 mmol/L (ref 98–110)
Creat: 0.9 mg/dL (ref 0.60–1.35)
GFR, Est African American: >89 mL/min (ref >=60)
Glucose, Bld: 83 mg/dL (ref 65–99)
Potassium: 4.4 mmol/L (ref 3.5–5.3)
Sodium: 138 mmol/L (ref 135–146)
TOTAL PROTEIN: 6.8 g/dL (ref 6.1–8.1)

## 2017-01-19 LAB — LIPID PANEL
CHOLESTEROL: 207 mg/dL — AB (ref <200)
HDL: 44 mg/dL (ref >40)
LDL CALC: 131 mg/dL — AB (ref <100)
TRIGLYCERIDES: 161 mg/dL — AB (ref <150)
Total CHOL/HDL Ratio: 4.7 Ratio (ref <5.0)
VLDL: 32 mg/dL — AB (ref <30)

## 2017-01-19 LAB — CARBAMAZEPINE LEVEL, TOTAL: Carbamazepine Lvl: 9 mg/L (ref 4.0–12.0)

## 2017-01-19 NOTE — Progress Notes (Signed)
Name: Alec Campbell   MRN: [id number]    DOB: 08-18-1971   Date:01/19/2017       Progress Note  Subjective  Chief Complaint  Chief Complaint  Patient presents with  . Annual Exam  . Medication Refill    6 month F/U  . Allergic Rhinitis     Runny nose and watery eyes. Takes medication as needed   . MR  . Seizures    No recent seizures    HPI  Functional ability/safety issues: He has MR and lives with his parents Hearing issues: Addressed  Activities of daily living: Discussed Home safety issues: No Issues  End Of Life Planning: Offered verbal information regarding advanced directives, healthcare power of attorney ( parents are still his legal guardian ).  Preventative care, Health maintenance, Preventative health measures discussed.  Preventative screenings discussed today: lab work, colonoscopy, PSA ( not indicated for him yet).  Men age 45 to 31 years if ever smoked recommended to get a one time AAA ultrasound screening exam.  Low Dose CT Chest recommended if Age 63-80 years, 30 pack-year currently smoking OR have quit w/in 15years.   Lifestyle risk factor issued reviewed: Diet, exercise, weight management, advised patient smoking is not healthy, nutrition/diet.  Preventative health measures discussed (5-10 year plan).  Reviewed and recommended vaccinations: - Pneumovax  - Prevnar  - Annual Influenza - Zostavax - Tdap   Depression screening: Done Fall risk screening: Done Discuss ADLs/IADLs: Done -he likes feeding the dogs, gets the mail.  Current medical providers: See HPI  Other health risk factors identified this visit: No other issues Cognitive impairment issues: None identified  All above discussed with patient. Appropriate education, counseling and referral will be made based upon the above.    Patient Active Problem List   Diagnosis Date Noted  . Cold sore 02/13/2016  . Vitamin D deficiency 07/20/2015  . Moderately mentally retarded 07/16/2015  .  Seizure disorder ([place]) 07/16/2015  . Seasonal allergic rhinitis 07/16/2015  . Dyslipidemia 07/16/2015  . Hyponatremia 06/13/2015  . Abnormal blood sugar 06/19/2008    Past Surgical History:  Procedure Laterality Date  . ANKLE SURGERY Right   . CLEFT LIP REPAIR    . HERNIA REPAIR     Inguinal  . TYMPANOSTOMY TUBE PLACEMENT      Family History  Problem Relation Age of Onset  . Hypertension Mother   . Thyroid disease Mother   . CAD Father   . Hypertension Father   . Hyperlipidemia Father   . Diabetes Brother   . Breast cancer Sister     Social History   Social History  . Marital status: Single    Spouse name: N/A  . Number of children: N/A  . Years of education: N/A   Occupational History  . Not on file.   Social History Main Topics  . Smoking status: Never Smoker  . Smokeless tobacco: Never Used  . Alcohol use No  . Drug use: No  . Sexual activity: Not Currently   Other Topics Concern  . Not on file   Social History Narrative  . No narrative on file     Current Outpatient Prescriptions:  .  chlorpheniramine (EQ CHLORTABS) 4 MG tablet, Take 4 mg by mouth once as needed for allergies., Disp: , Rfl:  .  cholecalciferol (VITAMIN D) 1000 units tablet, Take 1,000 Units by mouth daily., Disp: , Rfl:  .  EPITOL 200 MG tablet, TAKE ONE TABLET BY MOUTH 4 TIMES DAILY,  Disp: 120 tablet, Rfl: 5 .  PHENobarbital (LUMINAL) 64.8 MG tablet, TAKE ONE TABLET BY MOUTH TWICE DAILY, Disp: 60 tablet, Rfl: 5 .  PREVIDENT 5000 PLUS 1.1 % CREA dental cream, BRUSH ON TEETH TWICE A DAY -NO EATING OR DRINKING FOR 30 MINUTES AFTER USE, Disp: , Rfl: 5 .  valACYclovir (VALTREX) 1000 MG tablet, Take 1 tablet (1,000 mg total) by mouth 2 (two) times daily. Prn outbreak, Disp: 30 tablet, Rfl: 0  No Known Allergies   ROS  Constitutional: Negative for fever or significant weight change.  Respiratory: Negative for cough and shortness of breath.   Cardiovascular: Negative for chest pain or  palpitations.  Gastrointestinal: Negative for abdominal pain, no bowel changes.  Musculoskeletal: Negative for gait problem or joint swelling.  Skin: Negative for rash.  Neurological: Negative for dizziness or headache.  No other specific complaints in a complete review of systems (except as listed in HPI above).  Objective  Vitals:   01/19/17 0830  BP: 118/74  Pulse: 88  Resp: 18  Temp: 98.2 F (36.8 C)  TempSrc: Oral  SpO2: 94%  Weight: 171 lb (77.6 kg)  Height: 5\' 8"  (1.727 m)    Body mass index is 26 kg/m.  Physical Exam  Constitutional: Patient appears well-developed and well-nourished. No distress.  HENT: Head: Normocephalic and atraumatic. Ears: B TMs scar tissue of both sides, no erythema or effusion; Nose: Nose normal. Mouth/Throat: Oropharynx is clear and moist. No oropharyngeal exudate. scar from cleft palate surgery. Eyes: Conjunctivae and EOM are normal. Pupils are equal, round, and reactive to light. No scleral icterus.  Neck: Normal range of motion. Neck supple. No JVD present. No thyromegaly present.  Cardiovascular: Normal rate, regular rhythm and normal heart sounds.  No murmur heard. No BLE edema. Pulmonary/Chest: Effort normal and breath sounds normal. No respiratory distress. Abdominal: Soft. Bowel sounds are normal, no distension. There is no tenderness. He has direct inguinal hernia on right side, non-tenderness, reducible ( patient and mother want to watch for now) MALE GENITALIA: Normal descended testes bilaterally, no masses palpated, no hernias, no lesions, no discharge RECTAL: not done Musculoskeletal: Decrease rom of left foot, atrophy of right lower leg, callus formation on the base of 1st metatarsal  Neurological: he is alert and oriented to person, place, and time. No cranial nerve deficit. Coordination, balance, strength, speech and gait are normal.  Skin: Skin is warm and dry. No rash noted. No erythema.  Psychiatric: Patient has a normal mood  and affect. Cooperative, seems very happy   PHQ2/9: Depression screen [facility] 2/9 01/19/2017 07/21/2016 01/19/2016 07/18/2015  Decreased Interest 0 0 0 0  Down, Depressed, Hopeless 0 0 0 0  PHQ - 2 Score 0 0 0 0    Fall Risk: Fall Risk  01/19/2017 07/21/2016 01/19/2016 07/18/2015  Falls in the past year? No No No No     Functional Status Survey: Is the patient deaf or have difficulty hearing?: No Does the patient have difficulty seeing, even when wearing glasses/contacts?: No Does the patient have difficulty concentrating, remembering, or making decisions?: Yes Does the patient have difficulty walking or climbing stairs?: No Does the patient have difficulty dressing or bathing?: No Does the patient have difficulty doing errands alone such as visiting a doctor's office or shopping?: Yes    Assessment & Plan  1. Medicare annual wellness visit, subsequent  Discussed importance of 150 minutes of physical activity weekly, eat two servings of fish weekly, eat one serving of tree nuts (  cashews, pistachios, pecans, almonds.Marland Kitchen) every other day, eat 6 servings of fruit/vegetables daily and drink plenty of water and avoid sweet beverages.  Return for regular follow up  2. Seizure disorder ([facility])   - Carbamazepine Level (Tegretol), total - Phenobarbital level  3. Dyslipidemia  - Lipid panel  4. Vitamin D deficiency  - VITAMIN D 25 Hydroxy (Vit-D Deficiency, Fractures)  5. Abnormal blood sugar  - Hemoglobin A1c  6. Long-term use of high-risk medication  - COMPLETE METABOLIC PANEL WITH GFR - CBC with Differential/Platelet

## 2017-01-20 LAB — VITAMIN D 25 HYDROXY (VIT D DEFICIENCY, FRACTURES): Vit D, 25-Hydroxy: 44 ng/mL (ref 30–100)

## 2017-01-20 LAB — HEMOGLOBIN A1C
HEMOGLOBIN A1C: 5 % (ref <5.7)
Mean Plasma Glucose: 97 mg/dL

## 2017-01-20 LAB — PHENOBARBITAL LEVEL: PHENOBARBITAL: 27.8 mg/L (ref 15.0–40.0)

## 2017-04-21 ENCOUNTER — Ambulatory Visit (INDEPENDENT_AMBULATORY_CARE_PROVIDER_SITE_OTHER): Payer: Medicare Other | Admitting: Family Medicine

## 2017-04-21 ENCOUNTER — Encounter: Payer: Self-pay | Admitting: Family Medicine

## 2017-04-21 VITALS — BP 120/72 | HR 86 | Temp 97.8°F | Resp 16 | Ht 68.0 in | Wt 168.7 lb

## 2017-04-21 DIAGNOSIS — G40909 Epilepsy, unspecified, not intractable, without status epilepticus: Secondary | ICD-10-CM | POA: Diagnosis not present

## 2017-04-21 DIAGNOSIS — R7309 Other abnormal glucose: Secondary | ICD-10-CM | POA: Diagnosis not present

## 2017-04-21 DIAGNOSIS — F71 Moderate intellectual disabilities: Secondary | ICD-10-CM | POA: Diagnosis not present

## 2017-04-21 DIAGNOSIS — J302 Other seasonal allergic rhinitis: Secondary | ICD-10-CM | POA: Diagnosis not present

## 2017-04-21 DIAGNOSIS — E785 Hyperlipidemia, unspecified: Secondary | ICD-10-CM | POA: Diagnosis not present

## 2017-04-21 DIAGNOSIS — E871 Hypo-osmolality and hyponatremia: Secondary | ICD-10-CM

## 2017-04-21 MED ORDER — LORATADINE 10 MG PO TABS: 10.0000 mg | ORAL_TABLET | Freq: Every day | ORAL | 2 refills | Status: DC

## 2017-04-21 MED ORDER — FLUTICASONE PROPIONATE 50 MCG/ACT NA SUSP: 2.0000 | Freq: Every day | NASAL | 2 refills | Status: DC

## 2017-04-21 NOTE — Progress Notes (Signed)
Name: Alec Campbell   MRN: [id number]    DOB: 1971-04-14   Date:04/21/2017       Progress Note  Subjective  Chief Complaint  Chief Complaint  Patient presents with  . Medication Refill  . Seizures  . Allergic Rhinitis     Having to take medication due to allergy symptoms  . MR    HPI  AR: he  has worsening of nasal congestion, he has been sneezing and constant rhinorrhea. He ist taking otc allergy medication without much improvement. We will resume Loratadine  MR: he lives with his parents, he needs help with medication management, transportation and he is unable to manage his finances. Diagnosed as a toddler with developmental delay.   Seizure disorder: no symptoms in many years, mother is terrified of stopping his medication because his symptoms were severe. He has hyponatremia and was seeing nephrologist, last labs back to normal . He is now on only two pill of phenobarbital for the past 6 months without recurrence of symptoms. Last labs have been at goal.   Vitamin D: he took the prescriptions, he needs to take vitamin D 2000 units daily because of anti-seizure medication  Fever Blister: episodes once or less per month, uses topical medication otc, but it can last up to 2 weeks per episode and he rubs it all the time and sometimes it spreads  Hyperglycemia: last hgbA1C was back to normal. He denies polyphagia, polydipsia or polyuria. He is following a healthier diet, mother is on Weight watchers.   Dyslipidemia: he has normal HDL, LDL was 130, he avoids fried food and fast food, eats a lot of fruit and vegetables.   Patient Active Problem List   Diagnosis Date Noted  . Cold sore 02/13/2016  . Vitamin D deficiency 07/20/2015  . Moderately mentally retarded 07/16/2015  . Seizure disorder ([place]) 07/16/2015  . Seasonal allergic rhinitis 07/16/2015  . Dyslipidemia 07/16/2015  . Hyponatremia 06/13/2015  . Abnormal blood sugar 06/19/2008    Past Surgical History:   Procedure Laterality Date  . ANKLE SURGERY Right   . CLEFT LIP REPAIR    . HERNIA REPAIR     Inguinal  . TYMPANOSTOMY TUBE PLACEMENT      Family History  Problem Relation Age of Onset  . Hypertension Mother   . Thyroid disease Mother   . CAD Father   . Hypertension Father   . Hyperlipidemia Father   . Diabetes Brother   . Breast cancer Sister     Social History   Social History  . Marital status: Single    Spouse name: N/A  . Number of children: N/A  . Years of education: N/A   Occupational History  . Not on file.   Social History Main Topics  . Smoking status: Never Smoker  . Smokeless tobacco: Never Used  . Alcohol use No  . Drug use: No  . Sexual activity: Not Currently   Other Topics Concern  . Not on file   Social History Narrative  . No narrative on file     Current Outpatient Prescriptions:  .  chlorpheniramine (EQ CHLORTABS) 4 MG tablet, Take 4 mg by mouth once as needed for allergies., Disp: , Rfl:  .  cholecalciferol (VITAMIN D) 1000 units tablet, Take 1,000 Units by mouth daily., Disp: , Rfl:  .  EPITOL 200 MG tablet, TAKE ONE TABLET BY MOUTH 4 TIMES DAILY, Disp: 120 tablet, Rfl: 5 .  PHENobarbital (LUMINAL) 64.8 MG tablet, TAKE ONE TABLET  BY MOUTH TWICE DAILY, Disp: 60 tablet, Rfl: 5 .  PREVIDENT 5000 PLUS 1.1 % CREA dental cream, BRUSH ON TEETH TWICE A DAY -NO EATING OR DRINKING FOR 30 MINUTES AFTER USE, Disp: , Rfl: 5 .  valACYclovir (VALTREX) 1000 MG tablet, Take 1 tablet (1,000 mg total) by mouth 2 (two) times daily. Prn outbreak, Disp: 30 tablet, Rfl: 0  No Known Allergies   ROS  Constitutional: Negative for fever or significant  weight change.  Respiratory: Negative for cough and shortness of breath.   Cardiovascular: Negative for chest pain or palpitations.  Gastrointestinal: Negative for abdominal pain, no bowel changes.  Musculoskeletal: Negative for gait problem or joint swelling.  Skin: Negative for rash.  Neurological: Negative  for dizziness or headache.  No other specific complaints in a complete review of systems (except as listed in HPI above).  Objective  Vitals:   04/21/17 0915  BP: 120/72  Pulse: 86  Resp: 16  Temp: 97.8 F (36.6 C)  TempSrc: Oral  SpO2: 95%  Weight: 168 lb 11.2 oz (76.5 kg)  Height: 5\' 8"  (1.727 m)    Body mass index is 25.65 kg/m.  Physical Exam  Constitutional: Patient appears well-developed and well-nourished.  No distress.  HEENT: head atraumatic, normocephalic, pupils equal and reactive to light, neck supple, throat within normal limits, scar from cleft palate repair Cardiovascular: Normal rate, regular rhythm and normal heart sounds.  No murmur heard. No BLE edema. Pulmonary/Chest: Effort normal and breath sounds normal. No respiratory distress. Abdominal: Soft.  There is no tenderness. Neurological: no focal findings.  Psychiatric: Patient has a normal mood and affect. behavior is normal. Judgment and thought content normal.   PHQ2/9: Depression screen [facility] 2/9 04/21/2017 01/19/2017 07/21/2016 01/19/2016 07/18/2015  Decreased Interest 0 0 0 0 0  Down, Depressed, Hopeless 0 0 0 0 0  PHQ - 2 Score 0 0 0 0 0    Fall Risk: Fall Risk  04/21/2017 01/19/2017 07/21/2016 01/19/2016 07/18/2015  Falls in the past year? No No No No No    Functional Status Survey: Is the patient deaf or have difficulty hearing?: No Does the patient have difficulty seeing, even when wearing glasses/contacts?: No Does the patient have difficulty concentrating, remembering, or making decisions?: No Does the patient have difficulty walking or climbing stairs?: No Does the patient have difficulty dressing or bathing?: No Does the patient have difficulty doing errands alone such as visiting a doctor's office or shopping?: Yes    Assessment & Plan  1. Seizure disorder ([place])  No episodes, last labs at goal, we will recheck it  2. Dyslipidemia  On life style modification   3. Seasonal allergic  rhinitis, unspecified trigger  Symptoms have been worse lately, we will add nasal spray and loratadine - loratadine (CLARITIN) 10 MG tablet; Take 1 tablet (10 mg total) by mouth daily.  Dispense: 30 tablet; Refill: 2 - fluticasone (FLONASE) 50 MCG/ACT nasal spray; Place 2 sprays into both nostrils daily.  Dispense: 16 g; Refill: 2  4. Moderately mentally retarded  Stable, lives with parents  5. Hyponatremia  resolved  6. Abnormal blood sugar  He has changed his diet, losing weight, recheck labs next visit

## 2017-07-03 ENCOUNTER — Other Ambulatory Visit: Payer: Self-pay | Admitting: Family Medicine

## 2017-07-03 DIAGNOSIS — B001 Herpesviral vesicular dermatitis: Secondary | ICD-10-CM

## 2017-07-13 ENCOUNTER — Other Ambulatory Visit: Payer: Self-pay | Admitting: Family Medicine

## 2017-07-13 DIAGNOSIS — G40909 Epilepsy, unspecified, not intractable, without status epilepticus: Secondary | ICD-10-CM

## 2017-08-03 ENCOUNTER — Ambulatory Visit (INDEPENDENT_AMBULATORY_CARE_PROVIDER_SITE_OTHER): Payer: Medicare Other

## 2017-08-03 DIAGNOSIS — Z23 Encounter for immunization: Secondary | ICD-10-CM

## 2017-08-08 ENCOUNTER — Emergency Department: Payer: Medicare Other

## 2017-08-08 ENCOUNTER — Encounter: Payer: Self-pay | Admitting: Emergency Medicine

## 2017-08-08 ENCOUNTER — Emergency Department
Admission: EM | Admit: 2017-08-08 | Discharge: 2017-08-08 | Disposition: A | Discharge status: Home / Self Care | Payer: Medicare Other | Attending: Emergency Medicine | Admitting: Emergency Medicine

## 2017-08-08 DIAGNOSIS — S8991XA Unspecified injury of right lower leg, initial encounter: Secondary | ICD-10-CM | POA: Diagnosis not present

## 2017-08-08 DIAGNOSIS — S8011XA Contusion of right lower leg, initial encounter: Secondary | ICD-10-CM | POA: Diagnosis not present

## 2017-08-08 DIAGNOSIS — Z79899 Other long term (current) drug therapy: Secondary | ICD-10-CM | POA: Insufficient documentation

## 2017-08-08 DIAGNOSIS — W172XXA Fall into hole, initial encounter: Secondary | ICD-10-CM | POA: Insufficient documentation

## 2017-08-08 DIAGNOSIS — Y939 Activity, unspecified: Secondary | ICD-10-CM | POA: Insufficient documentation

## 2017-08-08 DIAGNOSIS — W19XXXA Unspecified fall, initial encounter: Secondary | ICD-10-CM

## 2017-08-08 DIAGNOSIS — Y929 Unspecified place or not applicable: Secondary | ICD-10-CM | POA: Insufficient documentation

## 2017-08-08 DIAGNOSIS — Y998 Other external cause status: Secondary | ICD-10-CM | POA: Insufficient documentation

## 2017-08-08 MED ORDER — NAPROXEN 500 MG PO TABS
500.0000 mg | ORAL_TABLET | Freq: Once | ORAL | Status: AC
Start: 2017-08-08 — End: 2017-08-08
  Administered 2017-08-08: 500 mg via ORAL
  Filled 2017-08-08: qty 1

## 2017-08-08 MED ORDER — NAPROXEN 500 MG PO TABS: 500.0000 mg | ORAL_TABLET | Freq: Two times a day (BID) | ORAL | 0 refills | Status: DC

## 2017-08-08 NOTE — ED Notes (Signed)
Pt presents after falling in a hole yesterday and hurting his right knee. Pt is able to walk on it, but it is painful. Pt alert; nad noted.

## 2017-08-08 NOTE — ED Notes (Signed)
Pt discharged to home.  Discharge instructions reviewed with mom.  Verbalized understanding.  No questions or concerns at this time.  Teach back verified.  Pt in NAD.  No items left in ED.

## 2017-08-08 NOTE — Discharge Instructions (Signed)
Ice and elevate knee as needed for discomfort. Begin taking naproxen 500 mg twice a day for the next 7 days. Follow-up with your primary care doctor if any continued problems.

## 2017-08-08 NOTE — ED Triage Notes (Signed)
Pt fell into a hole yesterday and now  Has right knee  Pain.

## 2017-08-08 NOTE — ED Provider Notes (Signed)
[facility] Emergency Department Provider Note  ___________________________________________   First MD Initiated Contact with Patient 08/08/17 1400     (approximate)  I have reviewed the triage vital signs and the nursing notes.   HISTORY  Chief Complaint Knee Pain and Fall Mother  HPI Alec Campbell is a 46 y.o. male is here with complaint of right knee pain. Patient fell in a hole yesterday as continued to have pain. Patient has not taken any over-the-counter medication for his pain. Mother states that since his fall he has continued to ambulate without assistance. This morning he continued to complain of pain.  History is also given by mother due to patient's moderate mental retardation. He rates his pain as 6/10.   Past Medical History:  Diagnosis Date  . Allergy   . Moderate mental retardation   . Seizures ([place])   . Vitamin D deficiency     Patient Active Problem List   Diagnosis Date Noted  . Cold sore 02/13/2016  . Vitamin D deficiency 07/20/2015  . Moderately mentally retarded 07/16/2015  . Seizure disorder ([place]) 07/16/2015  . Seasonal allergic rhinitis 07/16/2015  . Dyslipidemia 07/16/2015  . Hyponatremia 06/13/2015  . Abnormal blood sugar 06/19/2008    Past Surgical History:  Procedure Laterality Date  . ANKLE SURGERY Right   . CLEFT LIP REPAIR    . HERNIA REPAIR     Inguinal  . TYMPANOSTOMY TUBE PLACEMENT      Prior to Admission medications   Medication Sig Start Date End Date Taking? Authorizing Provider  chlorpheniramine (EQ CHLORTABS) 4 MG tablet Take 4 mg by mouth once as needed for allergies.    [provider]  cholecalciferol (VITAMIN D) 1000 units tablet Take 1,000 Units by mouth daily.    [provider]  EPITOL 200 MG tablet TAKE 1 TABLET BY MOUTH 4 TIMES DAILY 07/13/17   Ancil Boozer, Drue Stager, MD  fluticasone (FLONASE) 50 MCG/ACT nasal spray Place 2 sprays into both nostrils daily. 04/21/17   [name], MD  loratadine (CLARITIN) 10 MG tablet Take 1 tablet (10 mg total) by mouth daily. 04/21/17   [name], MD  naproxen (NAPROSYN) 500 MG tablet Take 1 tablet (500 mg total) by mouth 2 (two) times daily with a meal. 08/08/17   Madalyn Rob, Wendi Maya, PA-C  PHENobarbital (LUMINAL) 64.8 MG tablet TAKE ONE TABLET BY MOUTH TWICE DAILY 01/16/17   Sowles, Drue Stager, MD  PREVIDENT 5000 PLUS 1.1 % CREA dental cream BRUSH ON TEETH TWICE A DAY -NO EATING OR DRINKING FOR 30 MINUTES AFTER USE 12/30/15   [provider]  valACYclovir (VALTREX) 1000 MG tablet TAKE ONE TABLET BY MOUTH TWICE DAILY AS NEEDED FOR  OUTBREAK 07/04/17   [name], MD    Allergies Patient has no known allergies.  Family History  Problem Relation Age of Onset  . Hypertension Mother   . Thyroid disease Mother   . CAD Father   . Hypertension Father   . Hyperlipidemia Father   . Diabetes Brother   . Breast cancer Sister     Social History Social History  Substance Use Topics  . Smoking status: Never Smoker  . Smokeless tobacco: Never Used  . Alcohol use No    Review of Systems Constitutional: No fever/chills Cardiovascular: Denies chest pain. Respiratory: Denies shortness of breath. Gastrointestinal:   No nausea, no vomiting.   Musculoskeletal: Positive for right knee pain. Skin: Negative for rash. Neurological: Negative for focal weakness or numbness. ___________________________________________  PHYSICAL EXAM:  VITAL SIGNS: ED Triage Vitals  Enc Vitals Group     BP 08/08/17 1320 124/71     Pulse Rate 08/08/17 1320 81     Resp 08/08/17 1320 16     Temp 08/08/17 1320 98.6 F (37 C)     Temp Source 08/08/17 1320 Oral     SpO2 08/08/17 1320 97 %     Weight 08/08/17 1321 175 lb (79.4 kg)     Height 08/08/17 1321 5\' 10"  (1.778 m)     Head Circumference --      Peak Flow --      Pain Score 08/08/17 1323 6     Pain Loc --      Pain Edu? --      Excl. in [place]? --    Constitutional: Alert and  oriented. Well appearing and in no acute distress. Eyes: Conjunctivae are normal.  Head: Atraumatic. Neck: No stridor.   Cardiovascular: Normal rate, regular rhythm. Grossly normal heart sounds.  Good peripheral circulation. Respiratory: Normal respiratory effort.  No retractions. Lungs CTAB. Musculoskeletal: On examination of the right knee there is some soft tissue swelling present on the anterior portion. No effusion. No abrasions or ecchymosis. Patient was ambulatory without assistance. Range of motion is without restriction. Patient also points to mid shaft tibia stating that it hurts. Neurologic:  Normal speech and language. No gross focal neurologic deficits are appreciated. No gait instability. Skin:  Skin is warm, dry and intact. No ecchymosis, abrasions, erythema. Psychiatric: Mood and affect are normal. Speech and behavior are normal.  ____________________________________________   LABS (all labs ordered are listed, but only abnormal results are displayed)  Labs Reviewed - No data to display  RADIOLOGY  Dg Tibia/fibula Right  Result Date: 08/08/2017 CLINICAL DATA:  Right knee pain after fall. EXAM: RIGHT TIBIA AND FIBULA - 2 VIEW COMPARISON:  None. FINDINGS: There is no evidence of fracture or other focal bone lesions. Osteopenia. Soft tissues are unremarkable. IMPRESSION: Negative. Electronically Signed   By: Titus Dubin M.D.   On: 08/08/2017 14:30    ____________________________________________   PROCEDURES  Procedure(s) performed: None  Procedures  Critical Care performed: No  ____________________________________________   INITIAL IMPRESSION / ASSESSMENT AND PLAN / ED COURSE Patient was reassured that x-ray was negative for fracture. He was given naproxen 500 mg twice a day starting in the emergency department. Ice and elevation as needed for swelling or pain. He is to follow-up with his PCP if any continued problems. Mother is agreeable with this plan of  treatment.   ___________________________________________   FINAL CLINICAL IMPRESSION(S) / ED DIAGNOSES  Final diagnoses:  Contusion of right lower extremity, initial encounter  Fall, initial encounter      NEW MEDICATIONS STARTED DURING THIS VISIT:  Discharge Medication List as of 08/08/2017  2:56 PM    START taking these medications   Details  naproxen (NAPROSYN) 500 MG tablet Take 1 tablet (500 mg total) by mouth 2 (two) times daily with a meal., Starting Mon 08/08/2017, Print         Note:  This document was prepared using Dragon voice recognition software and may include unintentional dictation errors.    Johnn Hai, PA-C 08/08/17 1649    Earleen Newport, MD 08/10/17 [phone]

## 2017-08-12 ENCOUNTER — Other Ambulatory Visit: Payer: Self-pay | Admitting: Family Medicine

## 2017-08-12 DIAGNOSIS — G40909 Epilepsy, unspecified, not intractable, without status epilepticus: Secondary | ICD-10-CM

## 2017-08-15 NOTE — Telephone Encounter (Signed)
Refill request for general medication: Phenobarbital to [facility].  Last office visit: 04/21/2017  Next office visit is a ER Follow up on 08/16/2017 and Medication Follow up is on: 10/21/2017.  Lab Results  Component Value Date   CBMZ 9.0 01/19/2017

## 2017-08-16 ENCOUNTER — Ambulatory Visit (INDEPENDENT_AMBULATORY_CARE_PROVIDER_SITE_OTHER): Payer: Medicare Other | Admitting: Family Medicine

## 2017-08-16 ENCOUNTER — Encounter: Payer: Self-pay | Admitting: Family Medicine

## 2017-08-16 VITALS — BP 114/68 | HR 86 | Temp 98.1°F | Resp 16 | Ht 70.0 in | Wt 170.7 lb

## 2017-08-16 DIAGNOSIS — M25561 Pain in right knee: Secondary | ICD-10-CM

## 2017-08-16 DIAGNOSIS — S8001XD Contusion of right knee, subsequent encounter: Secondary | ICD-10-CM

## 2017-08-16 NOTE — Progress Notes (Signed)
Name: Alec Campbell   MRN: [id number]    DOB: 1971-02-20   Date:08/16/2017       Progress Note  Subjective  Chief Complaint  Chief Complaint  Patient presents with  . ER follow up  . Fall    Was at [facility] and fell in a hole, his right knee started to become swollen from the fall.     HPI  Right knee pain: he was walking out of [employer] on 08/07/2017 [facility] location), he step on a hole outside the building and fell forward hitting right knee on the ground. Parents took him to [facility] the following day because he seemed to be in pain. He had x-ray that was negative and was given Naproxen, pain is not affecting his ability to walk, but still complains of discomfort, no swelling. No increase in warmth. No other pains.    Patient Active Problem List   Diagnosis Date Noted  . Cold sore 02/13/2016  . Vitamin D deficiency 07/20/2015  . Moderately mentally retarded 07/16/2015  . Seizure disorder ([place]) 07/16/2015  . Seasonal allergic rhinitis 07/16/2015  . Dyslipidemia 07/16/2015  . Hyponatremia 06/13/2015  . Abnormal blood sugar 06/19/2008    Past Surgical History:  Procedure Laterality Date  . ANKLE SURGERY Right   . CLEFT LIP REPAIR    . HERNIA REPAIR     Inguinal  . TYMPANOSTOMY TUBE PLACEMENT      Family History  Problem Relation Age of Onset  . Hypertension Mother   . Thyroid disease Mother   . CAD Father   . Hypertension Father   . Hyperlipidemia Father   . Diabetes Brother   . Breast cancer Sister     Social History   Social History  . Marital status: Single    Spouse name: N/A  . Number of children: N/A  . Years of education: N/A   Occupational History  . Not on file.   Social History Main Topics  . Smoking status: Never Smoker  . Smokeless tobacco: Never Used  . Alcohol use No  . Drug use: No  . Sexual activity: Not Currently   Other Topics Concern  . Not on file   Social History Narrative  . No narrative on file     Current Outpatient  Prescriptions:  .  chlorpheniramine (EQ CHLORTABS) 4 MG tablet, Take 4 mg by mouth once as needed for allergies., Disp: , Rfl:  .  cholecalciferol (VITAMIN D) 1000 units tablet, Take 1,000 Units by mouth daily., Disp: , Rfl:  .  EPITOL 200 MG tablet, TAKE 1 TABLET BY MOUTH 4 TIMES DAILY, Disp: 120 tablet, Rfl: 5 .  fluticasone (FLONASE) 50 MCG/ACT nasal spray, Place 2 sprays into both nostrils daily., Disp: 16 g, Rfl: 2 .  loratadine (CLARITIN) 10 MG tablet, Take 1 tablet (10 mg total) by mouth daily., Disp: 30 tablet, Rfl: 2 .  naproxen (NAPROSYN) 500 MG tablet, Take 1 tablet (500 mg total) by mouth 2 (two) times daily with a meal., Disp: 14 tablet, Rfl: 0 .  PHENobarbital (LUMINAL) 64.8 MG tablet, TAKE 1 TABLET BY MOUTH TWICE DAILY, Disp: 60 tablet, Rfl: 2 .  PREVIDENT 5000 PLUS 1.1 % CREA dental cream, BRUSH ON TEETH TWICE A DAY -NO EATING OR DRINKING FOR 30 MINUTES AFTER USE, Disp: , Rfl: 5 .  valACYclovir (VALTREX) 1000 MG tablet, TAKE ONE TABLET BY MOUTH TWICE DAILY AS NEEDED FOR  OUTBREAK, Disp: 30 tablet, Rfl: 0  No Known Allergies  ROS  Ten systems reviewed and is negative except as mentioned in HPI   Objective  Vitals:   08/16/17 1446  BP: 114/68  Pulse: 86  Resp: 16  Temp: 98.1 F (36.7 C)  TempSrc: Oral  SpO2: 98%  Weight: 170 lb 11.2 oz (77.4 kg)  Height: 5\' 10"  (1.778 m)    Body mass index is 24.49 kg/m.  Physical Exam  Constitutional: Patient appears well-developed and well-nourished.  No distress.  HEENT: head atraumatic, normocephalic, pupils equal and reactive to light, neck supple, throat within normal limits Cardiovascular: Normal rate, regular rhythm and normal heart sounds.  No murmur heard. No BLE edema. Pulmonary/Chest: Effort normal and breath sounds normal. No respiratory distress. Abdominal: Soft.  There is no tenderness. Muscular skeletal: still fading bruise on right latera knee, knees are not symmetrical , but he has a history of right lower  leg atrophy from childhood and right leg is shorter than left. Normal rom of knee and no effusion Psychiatric:cooperative, seems to be in a good mood.   PHQ2/9: Depression screen [facility] 2/9 08/16/2017 04/21/2017 01/19/2017 07/21/2016 01/19/2016  Decreased Interest 0 0 0 0 0  Down, Depressed, Hopeless 0 0 0 0 0  PHQ - 2 Score 0 0 0 0 0    Fall Risk: Fall Risk  08/16/2017 04/21/2017 01/19/2017 07/21/2016 01/19/2016  Falls in the past year? Yes No No No No  Number falls in past yr: 1 - - - -  Injury with Fall? No - - - -    Functional Status Survey: Is the patient deaf or have difficulty hearing?: No Does the patient have difficulty seeing, even when wearing glasses/contacts?: No Does the patient have difficulty concentrating, remembering, or making decisions?: Yes Does the patient have difficulty walking or climbing stairs?: No Does the patient have difficulty dressing or bathing?: No Does the patient have difficulty doing errands alone such as visiting a doctor's office or shopping?: Yes    Assessment & Plan  1. Acute pain of right knee  From recent fall, he seems to be doing better , continue nsaid's for now, and continue to move  2. Contusion of right knee, subsequent encounter  Explained to continue moving, no effusion at this time. He has a atrophy of right lower leg ( chronic ).

## 2017-09-06 ENCOUNTER — Encounter: Payer: Self-pay | Admitting: Family Medicine

## 2017-09-06 ENCOUNTER — Ambulatory Visit (INDEPENDENT_AMBULATORY_CARE_PROVIDER_SITE_OTHER): Payer: Medicare Other | Admitting: Family Medicine

## 2017-09-06 VITALS — BP 100/80 | HR 83 | Temp 98.3°F | Resp 16 | Ht 70.0 in | Wt 170.5 lb

## 2017-09-06 DIAGNOSIS — G40909 Epilepsy, unspecified, not intractable, without status epilepticus: Secondary | ICD-10-CM

## 2017-09-06 DIAGNOSIS — E559 Vitamin D deficiency, unspecified: Secondary | ICD-10-CM

## 2017-09-06 DIAGNOSIS — E785 Hyperlipidemia, unspecified: Secondary | ICD-10-CM

## 2017-09-06 DIAGNOSIS — H1013 Acute atopic conjunctivitis, bilateral: Secondary | ICD-10-CM | POA: Diagnosis not present

## 2017-09-06 DIAGNOSIS — R7309 Other abnormal glucose: Secondary | ICD-10-CM | POA: Diagnosis not present

## 2017-09-06 DIAGNOSIS — J302 Other seasonal allergic rhinitis: Secondary | ICD-10-CM

## 2017-09-06 MED ORDER — OLOPATADINE HCL 0.2 % OP SOLN: 1.0000 [drp] | Freq: Every day | OPHTHALMIC | 0 refills | Status: DC

## 2017-09-06 NOTE — Progress Notes (Signed)
Name: Alec Campbell   MRN: [id number]    DOB: 1971/10/31   Date:09/06/2017       Progress Note  Subjective  Chief Complaint  Chief Complaint  Patient presents with  . Medication Refill    3 month F/U, needs fasting labs  . Seizures  . Allergic Rhinitis   . MR  . Eye Drainage    Onset-1 week ago, has been red and bothering him.   . Dyslipidemia    HPI  AR: only symptom at this time is runny and red eye, right worse than left, it is not matted. Taking Loratadine and other symptoms under control. No sneezing or rhinorrhea at this time.   MR: he lives with his parents, he needs help with medication management, transportation and he is unable to manage his finances. Diagnosed as a toddler with developmental delay.   Seizure disorder: no symptoms in many years, mother is terrified of stopping his medication because his symptoms were severe. He has hyponatremia and was seeing nephrologist, last labs back to normal . He is now on only two pill of phenobarbital for the past 6 months without recurrence of symptoms.  Vitamin D: he took the prescriptions, he needs to take vitamin D 2000 units daily because of anti-seizure medication, we will recheck labs next visit   Fever Blister: very seldom now  Hyperglycemia: last hgbA1C was back to normal. He denies polyphagia, polydipsia or polyuria. He is following a healthier diet.   Dyslipidemia: he has normal HDL, LDL was 130, he is avoiding  fried food and fast food, eats a lot of fruit and vegetables.   Patient Active Problem List   Diagnosis Date Noted  . Cold sore 02/13/2016  . Vitamin D deficiency 07/20/2015  . Moderately mentally retarded 07/16/2015  . Seizure disorder ([place]) 07/16/2015  . Seasonal allergic rhinitis 07/16/2015  . Dyslipidemia 07/16/2015  . Hyponatremia 06/13/2015  . Abnormal blood sugar 06/19/2008    Past Surgical History:  Procedure Laterality Date  . ANKLE SURGERY Right   . CLEFT LIP REPAIR    . HERNIA  REPAIR     Inguinal  . TYMPANOSTOMY TUBE PLACEMENT      Family History  Problem Relation Age of Onset  . Hypertension Mother   . Thyroid disease Mother   . CAD Father   . Hypertension Father   . Hyperlipidemia Father   . Diabetes Brother   . Breast cancer Sister     Social History   Socioeconomic History  . Marital status: Single    Spouse name: Not on file  . Number of children: Not on file  . Years of education: Not on file  . Highest education level: Not on file  Social Needs  . Financial resource strain: Not on file  . Food insecurity - worry: Not on file  . Food insecurity - inability: Not on file  . Transportation needs - medical: Not on file  . Transportation needs - non-medical: Not on file  Occupational History  . Not on file  Tobacco Use  . Smoking status: Never Smoker  . Smokeless tobacco: Never Used  Substance and Sexual Activity  . Alcohol use: No    Alcohol/week: 0.0 oz  . Drug use: No  . Sexual activity: Not Currently  Other Topics Concern  . Not on file  Social History Narrative  . Not on file     Current Outpatient Medications:  .  chlorpheniramine (EQ CHLORTABS) 4 MG tablet, Take 4 mg  by mouth once as needed for allergies., Disp: , Rfl:  .  cholecalciferol (VITAMIN D) 1000 units tablet, Take 1,000 Units by mouth daily., Disp: , Rfl:  .  EPITOL 200 MG tablet, TAKE 1 TABLET BY MOUTH 4 TIMES DAILY, Disp: 120 tablet, Rfl: 5 .  fluticasone (FLONASE) 50 MCG/ACT nasal spray, Place 2 sprays into both nostrils daily., Disp: 16 g, Rfl: 2 .  loratadine (CLARITIN) 10 MG tablet, Take 1 tablet (10 mg total) by mouth daily., Disp: 30 tablet, Rfl: 2 .  naproxen (NAPROSYN) 500 MG tablet, Take 1 tablet (500 mg total) by mouth 2 (two) times daily with a meal., Disp: 14 tablet, Rfl: 0 .  PHENobarbital (LUMINAL) 64.8 MG tablet, TAKE 1 TABLET BY MOUTH TWICE DAILY, Disp: 60 tablet, Rfl: 2 .  PREVIDENT 5000 PLUS 1.1 % CREA dental cream, BRUSH ON TEETH TWICE A DAY -NO  EATING OR DRINKING FOR 30 MINUTES AFTER USE, Disp: , Rfl: 5 .  valACYclovir (VALTREX) 1000 MG tablet, TAKE ONE TABLET BY MOUTH TWICE DAILY AS NEEDED FOR  OUTBREAK, Disp: 30 tablet, Rfl: 0  No Known Allergies   ROS  Constitutional: Negative for fever or weight change.  Respiratory: Negative for cough and shortness of breath.   Cardiovascular: Negative for chest pain or palpitations.  Gastrointestinal: Negative for abdominal pain, no bowel changes.  Musculoskeletal: Negative for gait problem or joint swelling.  Skin: Negative for rash.  Neurological: Negative for dizziness or headache.  No other specific complaints in a complete review of systems (except as listed in HPI above).  Objective  Vitals:   09/06/17 1033  BP: 100/80  Pulse: 83  Resp: 16  Temp: 98.3 F (36.8 C)  TempSrc: Oral  SpO2: 98%  Weight: 170 lb 8 oz (77.3 kg)  Height: 5\' 10"  (1.778 m)    Body mass index is 24.46 kg/m.  Physical Exam   Constitutional: Patient appears well-developed and well-nourished.  No distress.  HEENT: head atraumatic, normocephalic, pupils equal and reactive to light, neck supple, throat within normal limits, scar from cleft palate repair Cardiovascular: Normal rate, regular rhythm and normal heart sounds.  No murmur heard. No BLE edema. Pulmonary/Chest: Effort normal and breath sounds normal. No respiratory distress. Abdominal: Soft.  There is no tenderness. Neurological: no focal findings.  Psychiatric: Patient has a normal mood and affect. behavior is normal.    PHQ2/9: Depression screen [facility] 2/9 09/06/2017 08/16/2017 04/21/2017 01/19/2017 07/21/2016  Decreased Interest 0 0 0 0 0  Down, Depressed, Hopeless 0 0 0 0 0  PHQ - 2 Score 0 0 0 0 0     Fall Risk: Fall Risk  09/06/2017 08/16/2017 04/21/2017 01/19/2017 07/21/2016  Falls in the past year? Yes Yes No No No  Number falls in past yr: 1 1 - - -  Injury with Fall? No No - - -    Functional Status Survey: Is the patient  deaf or have difficulty hearing?: No Does the patient have difficulty seeing, even when wearing glasses/contacts?: No Does the patient have difficulty concentrating, remembering, or making decisions?: Yes Does the patient have difficulty walking or climbing stairs?: No Does the patient have difficulty dressing or bathing?: No Does the patient have difficulty doing errands alone such as visiting a doctor's office or shopping?: Yes    Assessment & Plan  1. Seizure disorder (HCC)  - COMPLETE METABOLIC PANEL WITH GFR - CBC with Differential/Platelet - Phenobarbital level - Carbamazepine Level (Tegretol), total  2. Seasonal allergic rhinitis,  unspecified trigger  stable  3. Dyslipidemia  Continue life style modification, recheck labs yearly   4. Abnormal blood sugar  - Hemoglobin A1c  5. Vitamin D deficiency  Recheck next visit   6. Acute atopic conjunctivitis of both eyes  - Olopatadine HCl 0.2 % SOLN; Apply 1 drop daily to eye.  Dispense: 2.5 mL; Refill: 0

## 2017-09-08 LAB — CBC WITH DIFFERENTIAL/PLATELET
BASOS ABS: 51 {cells}/uL (ref 0–200)
BASOS PCT: 0.8 %
EOS ABS: 378 {cells}/uL (ref 15–500)
EOS PCT: 5.9 %
HCT: 38.3 % — ABNORMAL LOW (ref 38.5–50.0)
Hemoglobin: 13.3 g/dL (ref 13.2–17.1)
Lymphs Abs: 1862 cells/uL (ref 850–3900)
MCH: 29.4 pg (ref 27.0–33.0)
MCHC: 34.7 g/dL (ref 32.0–36.0)
MCV: 84.5 fL (ref 80.0–100.0)
MONOS PCT: 5.8 %
MPV: 12.4 fL (ref 7.5–12.5)
NEUTROS ABS: 3738 {cells}/uL (ref 1500–7800)
Neutrophils Relative %: 58.4 %
PLATELETS: 177 10*3/uL (ref 140–400)
RBC: 4.53 10*6/uL (ref 4.20–5.80)
RDW: 12.4 % (ref 11.0–15.0)
TOTAL LYMPHOCYTE: 29.1 %
WBC mixed population: 371 cells/uL (ref 200–950)
WBC: 6.4 10*3/uL (ref 3.8–10.8)

## 2017-09-08 LAB — COMPLETE METABOLIC PANEL WITH GFR
AG RATIO: 1.6 (calc) (ref 1.0–2.5)
ALT: 12 U/L (ref 9–46)
AST: 16 U/L (ref 10–40)
Albumin: 4.6 g/dL (ref 3.6–5.1)
Alkaline phosphatase (APISO): 137 U/L — ABNORMAL HIGH (ref 40–115)
BILIRUBIN TOTAL: 0.4 mg/dL (ref 0.2–1.2)
BUN: 11 mg/dL (ref 7–25)
CALCIUM: 9.8 mg/dL (ref 8.6–10.3)
CHLORIDE: 98 mmol/L (ref 98–110)
CO2: 28 mmol/L (ref 20–32)
Creat: 0.77 mg/dL (ref 0.60–1.35)
GFR, EST NON AFRICAN AMERICAN: 108 mL/min/{1.73_m2} (ref >=60)
GFR, Est African American: 125 mL/min/{1.73_m2} (ref >=60)
GLOBULIN: 2.8 g/dL (ref 1.9–3.7)
Glucose, Bld: 81 mg/dL (ref 65–99)
POTASSIUM: 4.3 mmol/L (ref 3.5–5.3)
SODIUM: 135 mmol/L (ref 135–146)
Total Protein: 7.4 g/dL (ref 6.1–8.1)

## 2017-09-08 LAB — HEMOGLOBIN A1C
HEMOGLOBIN A1C: 5.2 %{Hb} (ref <5.7)
Mean Plasma Glucose: 103 (calc)
eAG (mmol/L): 5.7 (calc)

## 2017-09-08 LAB — CARBAMAZEPINE LEVEL, TOTAL: CARBAMAZEPINE LVL: 8.6 mg/L (ref 4.0–12.0)

## 2017-09-08 LAB — PHENOBARBITAL LEVEL: PHENOBARBITAL, SERUM: 23 mg/L (ref 15.0–40.0)

## 2017-09-27 ENCOUNTER — Encounter: Payer: Self-pay | Admitting: Family Medicine

## 2017-09-27 ENCOUNTER — Ambulatory Visit (INDEPENDENT_AMBULATORY_CARE_PROVIDER_SITE_OTHER): Payer: Medicare Other | Admitting: Family Medicine

## 2017-09-27 VITALS — BP 118/76 | HR 96 | Temp 97.7°F | Resp 18 | Ht 70.0 in | Wt 170.8 lb

## 2017-09-27 DIAGNOSIS — H1011 Acute atopic conjunctivitis, right eye: Secondary | ICD-10-CM

## 2017-09-27 DIAGNOSIS — J302 Other seasonal allergic rhinitis: Secondary | ICD-10-CM

## 2017-09-27 MED ORDER — AZELASTINE HCL 0.05 % OP SOLN: 1.0000 [drp] | Freq: Two times a day (BID) | OPHTHALMIC | 1 refills | Status: DC

## 2017-09-27 MED ORDER — LORATADINE 10 MG PO TABS: 10.0000 mg | ORAL_TABLET | Freq: Every day | ORAL | 2 refills | Status: DC

## 2017-09-27 NOTE — Patient Instructions (Signed)
Please call your eye doctor to schedule an appointment to ensure that they do an exam including a test to check the eye pressure.

## 2017-09-27 NOTE — Progress Notes (Signed)
Name: Alec Campbell   MRN: [id number]    DOB: Apr 22, 1971   Date:09/27/2017       Progress Note  Subjective  Chief Complaint  Chief Complaint  Patient presents with  . Follow-up    patient's dad stated that the condition with his eyes has not cleared up. the right eye is worst. no discharge, burning or itching, just watery. patient has used prescribed drops as directed    HPI Patient presents with his father who provides most of the history:  Red/Watery Eye: Runny and red eye, right worse than left for several weeks - it is not matted and there is no purulent exudate; no pain or itching, no vision changes.  Father states he wants to make sure there is no infection; has not seen an eye doctor recently. Has not been taking daily Loratadine - will refill today. Other allergic symptoms under control - no sneezing or rhinorrhea at this time. Has been using Olopatadine drops without relief.  Patient Active Problem List   Diagnosis Date Noted  . Cold sore 02/13/2016  . Vitamin D deficiency 07/20/2015  . Moderately mentally retarded 07/16/2015  . Seizure disorder ([place]) 07/16/2015  . Seasonal allergic rhinitis 07/16/2015  . Dyslipidemia 07/16/2015  . Hyponatremia 06/13/2015  . Abnormal blood sugar 06/19/2008    Social History   Tobacco Use  . Smoking status: Never Smoker  . Smokeless tobacco: Never Used  Substance Use Topics  . Alcohol use: No    Alcohol/week: 0.0 oz     Current Outpatient Medications:  .  chlorpheniramine (EQ CHLORTABS) 4 MG tablet, Take 4 mg by mouth once as needed for allergies., Disp: , Rfl:  .  cholecalciferol (VITAMIN D) 1000 units tablet, Take 1,000 Units by mouth daily., Disp: , Rfl:  .  EPITOL 200 MG tablet, TAKE 1 TABLET BY MOUTH 4 TIMES DAILY, Disp: 120 tablet, Rfl: 5 .  fluticasone (FLONASE) 50 MCG/ACT nasal spray, Place 2 sprays into both nostrils daily., Disp: 16 g, Rfl: 2 .  loratadine (CLARITIN) 10 MG tablet, Take 1 tablet (10 mg total) by mouth  daily., Disp: 30 tablet, Rfl: 2 .  naproxen (NAPROSYN) 500 MG tablet, Take 1 tablet (500 mg total) by mouth 2 (two) times daily with a meal., Disp: 14 tablet, Rfl: 0 .  Olopatadine HCl 0.2 % SOLN, Apply 1 drop daily to eye., Disp: 2.5 mL, Rfl: 0 .  PHENobarbital (LUMINAL) 64.8 MG tablet, TAKE 1 TABLET BY MOUTH TWICE DAILY, Disp: 60 tablet, Rfl: 2 .  PREVIDENT 5000 PLUS 1.1 % CREA dental cream, BRUSH ON TEETH TWICE A DAY -NO EATING OR DRINKING FOR 30 MINUTES AFTER USE, Disp: , Rfl: 5 .  valACYclovir (VALTREX) 1000 MG tablet, TAKE ONE TABLET BY MOUTH TWICE DAILY AS NEEDED FOR  OUTBREAK, Disp: 30 tablet, Rfl: 0  No Known Allergies  ROS  Constitutional: Negative for fever or weight change.  Respiratory: Negative for cough and shortness of breath.   Cardiovascular: Negative for chest pain or palpitations.  Gastrointestinal: Negative for abdominal pain, no bowel changes.  Musculoskeletal: Negative for gait problem or joint swelling.  Skin: Negative for rash.  Neurological: Negative for dizziness or headache.  No other specific complaints in a complete review of systems (except as listed in HPI above).  Objective  Vitals:   09/27/17 0810  BP: 118/76  Pulse: 96  Resp: 18  Temp: 97.7 F (36.5 C)  TempSrc: Oral  SpO2: 99%  Weight: 170 lb 12.8 oz (77.5 kg)  Height: 5\' 10"  (1.778 m)    Body mass index is 24.51 kg/m.  Nursing Note and Vital Signs reviewed.  Physical Exam  Constitutional: Patient appears well-developed and well-nourished. Obese No distress.  HEENT: head atraumatic, normocephalic, pupils equal and reactive to light, EOM's intact, right connjunctiva mildly erythematous, left conjunctiva exhibits no erythema; no exudate bilaterally; no swelling or erythema around the eyes, no mattering present.  Oropharynx pink and moist without exudate Cardiovascular: Normal rate, regular rhythm, S1/S2 present.  No murmur or rub heard. No BLE edema. Pulmonary/Chest: Effort normal and  breath sounds clear. No respiratory distress or retractions. Psychiatric: Patient has a baseline mood and affect. behavior is baseline. Judgment and thought content baseline.  Recent Results (from the past 2160 hour(s))  COMPLETE METABOLIC PANEL WITH GFR     Status: Abnormal   Collection Time: 09/06/17 11:49 AM  Result Value Ref Range   Glucose, Bld 81 65 - 99 mg/dL    Comment: .            Fasting reference interval .    BUN 11 7 - 25 mg/dL   Creat 0.77 0.60 - 1.35 mg/dL   GFR, Est Non African American 108 > OR = 60 mL/min/1.64m2   GFR, Est African American 125 > OR = 60 mL/min/1.68m2   BUN/Creatinine Ratio NOT APPLICABLE 6 - 22 (calc)   Sodium 135 135 - 146 mmol/L   Potassium 4.3 3.5 - 5.3 mmol/L   Chloride 98 98 - 110 mmol/L   CO2 28 20 - 32 mmol/L   Calcium 9.8 8.6 - 10.3 mg/dL   Total Protein 7.4 6.1 - 8.1 g/dL   Albumin 4.6 3.6 - 5.1 g/dL   Globulin 2.8 1.9 - 3.7 g/dL (calc)   AG Ratio 1.6 1.0 - 2.5 (calc)   Total Bilirubin 0.4 0.2 - 1.2 mg/dL   Alkaline phosphatase (APISO) 137 (H) 40 - 115 U/L   AST 16 10 - 40 U/L   ALT 12 9 - 46 U/L  CBC with Differential/Platelet     Status: Abnormal   Collection Time: 09/06/17 11:49 AM  Result Value Ref Range   WBC 6.4 3.8 - 10.8 Thousand/uL   RBC 4.53 4.20 - 5.80 Million/uL   Hemoglobin 13.3 13.2 - 17.1 g/dL   HCT 38.3 (L) 38.5 - 50.0 %   MCV 84.5 80.0 - 100.0 fL   MCH 29.4 27.0 - 33.0 pg   MCHC 34.7 32.0 - 36.0 g/dL   RDW 12.4 11.0 - 15.0 %   Platelets 177 140 - 400 Thousand/uL   MPV 12.4 7.5 - 12.5 fL   Neutro Abs 3,738 1,500 - 7,800 cells/uL   Lymphs Abs 1,862 850 - 3,900 cells/uL   WBC mixed population 371 200 - 950 cells/uL   Eosinophils Absolute 378 15 - 500 cells/uL   Basophils Absolute 51 0 - 200 cells/uL   Neutrophils Relative % 58.4 %   Total Lymphocyte 29.1 %   Monocytes Relative 5.8 %   Eosinophils Relative 5.9 %   Basophils Relative 0.8 %  Hemoglobin A1c     Status: None   Collection Time: 09/06/17 11:49  AM  Result Value Ref Range   Hgb A1c MFr Bld 5.2 <5.7 % of total Hgb    Comment: For the purpose of screening for the presence of diabetes: . <5.7%       Consistent with the absence of diabetes 5.7-6.4%    Consistent with increased risk for diabetes             (  prediabetes) > or =6.5%  Consistent with diabetes . This assay result is consistent with a decreased risk of diabetes. . Currently, no consensus exists regarding use of hemoglobin A1c for diagnosis of diabetes in children. . According to American Diabetes Association (ADA) guidelines, hemoglobin A1c <7.0% represents optimal control in non-pregnant diabetic patients. Different metrics may apply to specific patient populations.  Standards of Medical Care in Diabetes(ADA). .    Mean Plasma Glucose 103 (calc)   eAG (mmol/L) 5.7 (calc)  Phenobarbital level     Status: None   Collection Time: 09/06/17 11:49 AM  Result Value Ref Range   Phenobarbital, Serum 23.0 15.0 - 40.0 mg/L  Carbamazepine Level (Tegretol), total     Status: None   Collection Time: 09/06/17 11:49 AM  Result Value Ref Range   Carbamazepine Lvl 8.6 4.0 - 12.0 mg/L     Assessment & Plan  1. Seasonal allergic rhinitis, unspecified trigger - loratadine (CLARITIN) 10 MG tablet; Take 1 tablet (10 mg total) by mouth daily.  Dispense: 30 tablet; Refill: 2  2. Allergic conjunctivitis of right eye - azelastine (OPTIVAR) 0.05 % ophthalmic solution; Place 1 drop into both eyes 2 (two) times daily.  Dispense: 6 mL; Refill: 1  - Advised that this still appears to be bacterial, and provided reassurance that this is unlikely to be bacterial as the course is not worsening, but rather remaining about the same.  Take claritin daily, switch drops to azelastine, schedule appointment with optometrist/opthalmologist to have eye exam w/ pressure check. Father declines referral and states he will call their eye center himself.  Follow up in 3 weeks if not improving for  further evaluation.  -Red flags and when to present for emergency care or RTC including fever >101.7F, new/worsening/un-resolving symptoms, ocular swelling, vision changes, change in drainage from clear to purulent, reviewed with patient at time of visit. Follow up and care instructions discussed and provided in AVS.

## 2017-10-21 ENCOUNTER — Ambulatory Visit: Payer: Self-pay | Admitting: Family Medicine

## 2017-11-09 ENCOUNTER — Telehealth: Payer: Self-pay

## 2017-11-09 NOTE — Telephone Encounter (Signed)
Copied from [place] [id number]. Topic: Referral - Request >> Nov 09, 2017  9:14 AM Synthia Innocent wrote: Reason for CRM: Requesting referral to Dr Paulla Dolly, [facility]. Patient having pain with left big toe

## 2017-11-09 NOTE — Telephone Encounter (Signed)
I need to see him to place referral, for insurance purpose. Can he come in tomorrow?

## 2017-11-09 NOTE — Telephone Encounter (Signed)
Spoke with Francee Piccolo (dad) and scheduled appointment for 11/10/17

## 2017-11-10 ENCOUNTER — Ambulatory Visit (INDEPENDENT_AMBULATORY_CARE_PROVIDER_SITE_OTHER): Payer: Medicare Other | Admitting: Family Medicine

## 2017-11-10 ENCOUNTER — Encounter: Payer: Self-pay | Admitting: Family Medicine

## 2017-11-10 ENCOUNTER — Other Ambulatory Visit: Payer: Self-pay | Admitting: Family Medicine

## 2017-11-10 VITALS — BP 110/80 | HR 80 | Resp 14 | Ht 70.0 in | Wt 169.2 lb

## 2017-11-10 DIAGNOSIS — G40909 Epilepsy, unspecified, not intractable, without status epilepticus: Secondary | ICD-10-CM | POA: Diagnosis not present

## 2017-11-10 DIAGNOSIS — M79672 Pain in left foot: Secondary | ICD-10-CM | POA: Diagnosis not present

## 2017-11-10 MED ORDER — CARBAMAZEPINE 200 MG PO TABS: ORAL_TABLET | ORAL | 5 refills | Status: DC

## 2017-11-10 MED ORDER — PHENOBARBITAL 64.8 MG PO TABS: 64.8000 mg | ORAL_TABLET | Freq: Two times a day (BID) | ORAL | 5 refills | Status: DC

## 2017-11-10 NOTE — Progress Notes (Signed)
Name: Alec Campbell   MRN: [id number]    DOB: 1971-04-14   Date:11/10/2017       Progress Note  Subjective  Chief Complaint  Chief Complaint  Patient presents with  . Foot Pain  . Referral    HPI  Foot pain: he had surgery as an infant on left ankle and foot , and father has noticed prominence of left forefoot, he seems to be better when barefoot, but has pain when wearing shoes for a long time, no redness or increase in warmth  AR: only symptom at this time is runny and red eye, right worse than left, it is not matted. Taking Loratadine and other symptoms under control. No sneezing or rhinorrhea at this time.   MR: he lives with his parents, he needs help with medication management, transportation and he is unable to manage his finances, also needs helps when seeing a physician.  Diagnosed as a toddler with developmental delay.   Seizure disorder: no symptoms in many years, mother is terrified of stopping his medication because his symptoms were severe. He has hyponatremia and was seeingnephrologist, last labs back to normal. He is now on only two pill of phenobarbital and 4 pills of carbamazepine daily without any side effects. Reviewed labs done 09/2017  Patient Active Problem List   Diagnosis Date Noted  . Cold sore 02/13/2016  . Vitamin D deficiency 07/20/2015  . Moderately mentally retarded 07/16/2015  . Seizure disorder ([place]) 07/16/2015  . Seasonal allergic rhinitis 07/16/2015  . Dyslipidemia 07/16/2015  . Hyponatremia 06/13/2015  . Abnormal blood sugar 06/19/2008    Past Surgical History:  Procedure Laterality Date  . ANKLE SURGERY Right   . CLEFT LIP REPAIR    . HERNIA REPAIR     Inguinal  . TYMPANOSTOMY TUBE PLACEMENT      Family History  Problem Relation Age of Onset  . Hypertension Mother   . Thyroid disease Mother   . CAD Father   . Hypertension Father   . Hyperlipidemia Father   . Diabetes Brother   . Breast cancer Sister     Social History    Socioeconomic History  . Marital status: Single    Spouse name: Not on file  . Number of children: Not on file  . Years of education: Not on file  . Highest education level: Not on file  Social Needs  . Financial resource strain: Not on file  . Food insecurity - worry: Not on file  . Food insecurity - inability: Not on file  . Transportation needs - medical: Not on file  . Transportation needs - non-medical: Not on file  Occupational History  . Not on file  Tobacco Use  . Smoking status: Never Smoker  . Smokeless tobacco: Never Used  Substance and Sexual Activity  . Alcohol use: No    Alcohol/week: 0.0 oz  . Drug use: No  . Sexual activity: Not Currently  Other Topics Concern  . Not on file  Social History Narrative  . Not on file     Current Outpatient Medications:  .  azelastine (OPTIVAR) 0.05 % ophthalmic solution, Place 1 drop into both eyes 2 (two) times daily., Disp: 6 mL, Rfl: 1 .  carbamazepine (EPITOL) 200 MG tablet, TAKE 1 TABLET BY MOUTH 4 TIMES DAILY, Disp: 120 tablet, Rfl: 5 .  chlorpheniramine (EQ CHLORTABS) 4 MG tablet, Take 4 mg by mouth once as needed for allergies., Disp: , Rfl:  .  cholecalciferol (VITAMIN D) 1000  units tablet, Take 1,000 Units by mouth daily., Disp: , Rfl:  .  fluticasone (FLONASE) 50 MCG/ACT nasal spray, Place 2 sprays into both nostrils daily., Disp: 16 g, Rfl: 2 .  loratadine (CLARITIN) 10 MG tablet, Take 1 tablet (10 mg total) by mouth daily., Disp: 30 tablet, Rfl: 2 .  PHENobarbital (LUMINAL) 64.8 MG tablet, Take 1 tablet (64.8 mg total) by mouth 2 (two) times daily., Disp: 60 tablet, Rfl: 5 .  PREVIDENT 5000 PLUS 1.1 % CREA dental cream, BRUSH ON TEETH TWICE A DAY -NO EATING OR DRINKING FOR 30 MINUTES AFTER USE, Disp: , Rfl: 5 .  valACYclovir (VALTREX) 1000 MG tablet, TAKE ONE TABLET BY MOUTH TWICE DAILY AS NEEDED FOR  OUTBREAK, Disp: 30 tablet, Rfl: 0  No Known Allergies   ROS  Ten systems reviewed and is negative except as  mentioned in HPI  Objective  Vitals:   11/10/17 1024  BP: 110/80  Pulse: 80  Resp: 14  SpO2: 98%  Weight: 169 lb 3.2 oz (76.7 kg)  Height: 5\' 10"  (1.778 m)    Body mass index is 24.28 kg/m.  Physical Exam  Constitutional: Patient appears well-developed and well-nourished. No distress.  HEENT: head atraumatic, normocephalic, pupils equal and reactive to light, neck supple, throat within normal limits Cardiovascular: Normal rate, regular rhythm and normal heart sounds.  No murmur heard. No BLE edema. Pulmonary/Chest: Effort normal and breath sounds normal. No respiratory distress. Abdominal: Soft.  There is no tenderness. Muscular Skeletal: large callus on left forefoot, some deformity - contracture of forefoot, he has a history of surgery as an infant Psychiatric: Patient has a normal mood and affect. behavior is normal. Judgment and thought content normal.  Recent Results (from the past 2160 hour(s))  COMPLETE METABOLIC PANEL WITH GFR     Status: Abnormal   Collection Time: 09/06/17 11:49 AM  Result Value Ref Range   Glucose, Bld 81 65 - 99 mg/dL    Comment: .            Fasting reference interval .    BUN 11 7 - 25 mg/dL   Creat 0.77 0.60 - 1.35 mg/dL   GFR, Est Non African American 108 > OR = 60 mL/min/1.27m2   GFR, Est African American 125 > OR = 60 mL/min/1.82m2   BUN/Creatinine Ratio NOT APPLICABLE 6 - 22 (calc)   Sodium 135 135 - 146 mmol/L   Potassium 4.3 3.5 - 5.3 mmol/L   Chloride 98 98 - 110 mmol/L   CO2 28 20 - 32 mmol/L   Calcium 9.8 8.6 - 10.3 mg/dL   Total Protein 7.4 6.1 - 8.1 g/dL   Albumin 4.6 3.6 - 5.1 g/dL   Globulin 2.8 1.9 - 3.7 g/dL (calc)   AG Ratio 1.6 1.0 - 2.5 (calc)   Total Bilirubin 0.4 0.2 - 1.2 mg/dL   Alkaline phosphatase (APISO) 137 (H) 40 - 115 U/L   AST 16 10 - 40 U/L   ALT 12 9 - 46 U/L  CBC with Differential/Platelet     Status: Abnormal   Collection Time: 09/06/17 11:49 AM  Result Value Ref Range   WBC 6.4 3.8 - 10.8  Thousand/uL   RBC 4.53 4.20 - 5.80 Million/uL   Hemoglobin 13.3 13.2 - 17.1 g/dL   HCT 38.3 (L) 38.5 - 50.0 %   MCV 84.5 80.0 - 100.0 fL   MCH 29.4 27.0 - 33.0 pg   MCHC 34.7 32.0 - 36.0 g/dL   RDW  12.4 11.0 - 15.0 %   Platelets 177 140 - 400 Thousand/uL   MPV 12.4 7.5 - 12.5 fL   Neutro Abs 3,738 1,500 - 7,800 cells/uL   Lymphs Abs 1,862 850 - 3,900 cells/uL   WBC mixed population 371 200 - 950 cells/uL   Eosinophils Absolute 378 15 - 500 cells/uL   Basophils Absolute 51 0 - 200 cells/uL   Neutrophils Relative % 58.4 %   Total Lymphocyte 29.1 %   Monocytes Relative 5.8 %   Eosinophils Relative 5.9 %   Basophils Relative 0.8 %  Hemoglobin A1c     Status: None   Collection Time: 09/06/17 11:49 AM  Result Value Ref Range   Hgb A1c MFr Bld 5.2 <5.7 % of total Hgb    Comment: For the purpose of screening for the presence of diabetes: . <5.7%       Consistent with the absence of diabetes 5.7-6.4%    Consistent with increased risk for diabetes             (prediabetes) > or =6.5%  Consistent with diabetes . This assay result is consistent with a decreased risk of diabetes. . Currently, no consensus exists regarding use of hemoglobin A1c for diagnosis of diabetes in children. . According to American Diabetes Association (ADA) guidelines, hemoglobin A1c <7.0% represents optimal control in non-pregnant diabetic patients. Different metrics may apply to specific patient populations.  Standards of Medical Care in Diabetes(ADA). .    Mean Plasma Glucose 103 (calc)   eAG (mmol/L) 5.7 (calc)  Phenobarbital level     Status: None   Collection Time: 09/06/17 11:49 AM  Result Value Ref Range   Phenobarbital, Serum 23.0 15.0 - 40.0 mg/L  Carbamazepine Level (Tegretol), total     Status: None   Collection Time: 09/06/17 11:49 AM  Result Value Ref Range   Carbamazepine Lvl 8.6 4.0 - 12.0 mg/L     PHQ2/9: Depression screen [facility] 2/9 09/27/2017 09/06/2017 08/16/2017 04/21/2017  01/19/2017  Decreased Interest 0 0 0 0 0  Down, Depressed, Hopeless 0 0 0 0 0  PHQ - 2 Score 0 0 0 0 0     Fall Risk: Fall Risk  11/10/2017 09/27/2017 09/06/2017 08/16/2017 04/21/2017  Falls in the past year? No Yes Yes Yes No  Number falls in past yr: - 1 1 1  -  Injury with Fall? - Yes No No -     Functional Status Survey: Is the patient deaf or have difficulty hearing?: No Does the patient have difficulty seeing, even when wearing glasses/contacts?: No Does the patient have difficulty concentrating, remembering, or making decisions?: Yes Does the patient have difficulty walking or climbing stairs?: No Does the patient have difficulty dressing or bathing?: No Does the patient have difficulty doing errands alone such as visiting a doctor's office or shopping?: Yes    Assessment & Plan  1. Foot pain, left  - Ambulatory referral to Podiatry  2. Seizure disorder ([facility])  - PHENobarbital (LUMINAL) 64.8 MG tablet; Take 1 tablet (64.8 mg total) by mouth 2 (two) times daily.  Dispense: 60 tablet; Refill: 5 - carbamazepine (EPITOL) 200 MG tablet; TAKE 1 TABLET BY MOUTH 4 TIMES DAILY  Dispense: 120 tablet; Refill: 5

## 2017-11-11 ENCOUNTER — Telehealth: Payer: Self-pay | Admitting: Family Medicine

## 2017-11-11 ENCOUNTER — Telehealth: Payer: Self-pay

## 2017-11-11 NOTE — Telephone Encounter (Signed)
Medication refill. Last office visit 11/10/17. Thanks.

## 2017-11-11 NOTE — Telephone Encounter (Signed)
Spoke with dad. Told him prescription was printed and I handed it to him during his visit with the AVS. He went back through the paperwork and found it.

## 2017-11-11 NOTE — Telephone Encounter (Signed)
Copied from [place] [id number]. Topic: Inquiry >> Nov 11, 2017  7:59 AM Pricilla Handler wrote: Reason for CRM: Patient's father called stating that the patient needs a refill of PHENobarbital (LUMINAL) 64.8 MG tablet. He also states that patient received a refill of Epitol, instead of PHENobarbital (LUMINAL) 64.8 MG tablet after patient last visit with Dr. Ancil Boozer. Patient would like a call from someone in the office once prescription has been sent in. Patient is completely out of PHENobarbital (LUMINAL) 64.8 MG tablet.      Thank You!!!

## 2017-11-11 NOTE — Telephone Encounter (Signed)
It was sent yesterday to [facility] as requested

## 2017-11-11 NOTE — Telephone Encounter (Signed)
Copied from [place] [id number]. Topic: Inquiry >> Nov 11, 2017  7:59 AM Pricilla Handler wrote: Reason for CRM: Patient's father called stating that the patient needs a refill of PHENobarbital (LUMINAL) 64.8 MG tablet. He also states that patient received a refill of Epitol, instead of PHENobarbital (LUMINAL) 64.8 MG tablet after patient last visit with Dr. Ancil Boozer. Patient would like a call from someone in the office once prescription has been sent in. Patient is completely out of PHENobarbital (LUMINAL) 64.8 MG tablet.      Thank You!!!

## 2017-11-22 ENCOUNTER — Other Ambulatory Visit: Payer: Self-pay | Admitting: Podiatry

## 2017-11-22 ENCOUNTER — Ambulatory Visit (INDEPENDENT_AMBULATORY_CARE_PROVIDER_SITE_OTHER): Payer: Medicare Other

## 2017-11-22 ENCOUNTER — Encounter: Payer: Self-pay | Admitting: Podiatry

## 2017-11-22 ENCOUNTER — Ambulatory Visit (INDEPENDENT_AMBULATORY_CARE_PROVIDER_SITE_OTHER): Payer: Medicare Other | Admitting: Podiatry

## 2017-11-22 DIAGNOSIS — Q667 Congenital pes cavus, unspecified foot: Secondary | ICD-10-CM

## 2017-11-22 DIAGNOSIS — M216X9 Other acquired deformities of unspecified foot: Secondary | ICD-10-CM

## 2017-11-22 DIAGNOSIS — R52 Pain, unspecified: Secondary | ICD-10-CM

## 2017-11-22 DIAGNOSIS — M7742 Metatarsalgia, left foot: Secondary | ICD-10-CM

## 2017-11-22 MED ORDER — MELOXICAM 15 MG PO TABS: 15.0000 mg | ORAL_TABLET | Freq: Every day | ORAL | 1 refills | Status: AC

## 2017-11-22 NOTE — Progress Notes (Signed)
Patient ID: Belal Scallon, male   DOB: Aug 28, 1971, 47 y.o.   MRN: [id number]   HPI: 47 year old male with diagnosed moderate mental retardation presents the office today with his parents for left foot pain is been going on for approximately 3 months.  Patient mother states that in October 2018 he stepped in a hole and injured his foot.  She also states that certain shoes that he wears aggravated his foot and he was complaining of some significant pain to the front of his foot.  Her pain was so severe that he was not able to walk or stand on it for long periods of time and he was good on the floor.  Patient was referred from his primary care physician for follow-up treatment and evaluation.  Past Medical History:  Diagnosis Date  . Allergy   . Moderate mental retardation   . Seizures ([place])   . Vitamin D deficiency      Physical Exam: General: The patient is alert and oriented x3 in no acute distress.  Dermatology: Skin is warm, dry and supple bilateral lower extremities. Negative for open lesions or macerations.  Vascular: Palpable pedal pulses bilaterally. No edema or erythema noted. Capillary refill within normal limits.  Neurological: Epicritic and protective threshold grossly intact bilaterally.   Musculoskeletal Exam: Range of motion within normal limits to all pedal and ankle joints bilateral. Muscle strength 5/5 in all groups bilateral.  Negative for any significant pain on palpation to the left foot.  There is some very mild discomfort with palpation to the metatarsal heads involve the foot left lower extremity.  Evident cavus foot type noted bilateral  Radiographic Exam:  Normal osseous mineralization. Joint spaces preserved. No fracture/dislocation/boney destruction.  Increased calcaneal inclination angle with an increased metatarsal declination angle consistent with a cavus foot type.    Assessment: -Cavus foot type bilateral -Metatarsalgia left foot-improved.   Plan of Care:    -Patient was evaluated today.  X-rays reviewed today. -Recommended the patient wear good supportive tennis shoes.  At the moment he is not really presenting with any significant pain or pathology. -Explained to the parents that if the patient continues to complain of discomfort to his feet custom molded orthotics would be the next step to support the foot and alleviate any pain. -Prescription for meloxicam 15mg  as needed -Return to clinic as needed   Edrick Kins, DPM [facility]  Dr. Edrick Kins, DPM    [address], [place] [address]                Office [phone]  Fax [phone]

## 2017-11-22 NOTE — Progress Notes (Signed)
   Subjective:    Patient ID: Alec Campbell, male    DOB: May 26, 1971, 47 y.o.   MRN: [id number]  HPI    Review of Systems  Gastrointestinal: Positive for constipation.  Neurological: Positive for seizures.       Objective:   Physical Exam        Assessment & Plan:

## 2018-02-17 ENCOUNTER — Telehealth: Payer: Self-pay

## 2018-02-17 NOTE — Telephone Encounter (Signed)
Pt NS'd for 02/17/18 AWV. Called pt to resched AWV w/ [facility]. LVM requesting returned call.

## 2018-02-28 ENCOUNTER — Ambulatory Visit (INDEPENDENT_AMBULATORY_CARE_PROVIDER_SITE_OTHER): Payer: Medicare Other

## 2018-02-28 VITALS — BP 100/60 | HR 76 | Temp 97.9°F | Resp 12 | Ht 70.0 in | Wt 170.9 lb

## 2018-02-28 DIAGNOSIS — Z0000 Encounter for general adult medical examination without abnormal findings: Secondary | ICD-10-CM

## 2018-02-28 NOTE — Progress Notes (Addendum)
Subjective:   Alec Campbell is a 47 y.o. male who presents for Medicare Annual/Subsequent preventive examination.  Review of Systems:  N/A Cardiac Risk Factors include: dyslipidemia;male gender;sedentary lifestyle     Objective:    Vitals: Ht 5\' 10"  (1.778 m)   Wt 170 lb 14.4 oz (77.5 kg)   BMI 24.52 kg/m   Body mass index is 24.52 kg/m.  Advanced Directives 02/28/2018 08/16/2017 04/21/2017 01/19/2017 07/21/2016 01/19/2016 07/18/2015  Does Patient Have a Medical Advance Directive? No No No No No No No  Would patient like information on creating a medical advance directive? Yes (MAU/Ambulatory/Procedural Areas - Information given) - - - No - patient declined information - No - patient declined information    Tobacco Social History   Tobacco Use  Smoking Status Never Smoker  Smokeless Tobacco Never Used  Tobacco Comment   smoking cessation materials not required     Counseling given: No Comment: smoking cessation materials not required  Clinical Intake:  Pre-visit preparation completed: Yes  Pain : No/denies pain   BMI - recorded: 24.52 Nutritional Status: BMI of 19-24  Normal Nutritional Risks: None Diabetes: No  How often do you need to have someone help you when you read instructions, pamphlets, or other written materials from your doctor or pharmacy?: 5 - Always  Interpreter Needed?: No  Information entered by :: AEversole, LPN   Hospitalizations/ED visits and surgeries occurring within the previous 12 months:  Within the previous 12 months, pt has not underwent any surgical procedures. However, pt was seen on 08/08/17 @ [facility] ED for fall with contusion to R lower extremity, treated by Dr. Jimmye Norman.  In addition to the above listed admission/visit, pt was seen in follow up by Dr. Ancil Boozer on 08/16/17. (if seen, indicate dates of OV). Planning included:  Assessment & Plan  1. Acute pain of right knee  From recent fall, he seems to be doing better , continue  nsaid's for now, and continue to move  2. Contusion of right knee, subsequent encounter  Explained to continue moving, no effusion at this time. He has a atrophy of right lower leg ( chronic ).  Past Medical History:  Diagnosis Date  . Allergy   . Moderate mental retardation   . Seizures ([place])   . Vitamin D deficiency    Past Surgical History:  Procedure Laterality Date  . ANKLE SURGERY Right   . CLEFT LIP REPAIR    . HERNIA REPAIR     Inguinal  . TYMPANOSTOMY TUBE PLACEMENT     Family History  Problem Relation Age of Onset  . Hypertension Mother   . Thyroid disease Mother   . CAD Father   . Hypertension Father   . Hyperlipidemia Father   . Healthy Brother   . Healthy Sister   . Diabetes Brother   . COPD Brother   . Emphysema Brother   . Alcohol abuse Brother   . Hypertension Brother   . Aneurysm Brother        brain   Social History   Socioeconomic History  . Marital status: Single    Spouse name: Not on file  . Number of children: 0  . Years of education: Handicapped diploma  . Highest education level: Not on file  Occupational History    Employer: DISABLED  Social Needs  . Financial resource strain: Not hard at all  . Food insecurity:    Worry: Never true    Inability: Never true  . Transportation  needs:    Medical: No    Non-medical: No  Tobacco Use  . Smoking status: Never Smoker  . Smokeless tobacco: Never Used  . Tobacco comment: smoking cessation materials not required  Substance and Sexual Activity  . Alcohol use: No    Alcohol/week: 0.0 oz  . Drug use: No  . Sexual activity: Not Currently  Lifestyle  . Physical activity:    Days per week: 0 days    Minutes per session: 0 min  . Stress: Not at all  Relationships  . Social connections:    Talks on phone: Patient refused    Gets together: Patient refused    Attends religious service: Patient refused    Active member of club or organization: Patient refused    Attends meetings of  clubs or organizations: Patient refused    Relationship status: Patient refused  Other Topics Concern  . Not on file  Social History Narrative   Moderate mental retardation. Mom is primary caregiver and lives at home with her.    Outpatient Encounter Medications as of 02/28/2018  Medication Sig  . carbamazepine (EPITOL) 200 MG tablet TAKE 1 TABLET BY MOUTH 4 TIMES DAILY  . loratadine (CLARITIN) 10 MG tablet Take 1 tablet (10 mg total) by mouth daily.  Marland Kitchen PHENobarbital (LUMINAL) 64.8 MG tablet Take 1 tablet (64.8 mg total) by mouth 2 (two) times daily.  Marland Kitchen PREVIDENT 5000 PLUS 1.1 % CREA dental cream BRUSH ON TEETH TWICE A DAY -NO EATING OR DRINKING FOR 30 MINUTES AFTER USE  . [DISCONTINUED] cholecalciferol (VITAMIN D) 1000 units tablet Take 1,000 Units by mouth daily.  . valACYclovir (VALTREX) 1000 MG tablet TAKE ONE TABLET BY MOUTH TWICE DAILY AS NEEDED FOR  OUTBREAK (Patient not taking: Reported on 02/28/2018)  . [DISCONTINUED] azelastine (OPTIVAR) 0.05 % ophthalmic solution Place 1 drop into both eyes 2 (two) times daily.  . [DISCONTINUED] chlorpheniramine (EQ CHLORTABS) 4 MG tablet Take 4 mg by mouth once as needed for allergies.  . [DISCONTINUED] fluticasone (FLONASE) 50 MCG/ACT nasal spray Place 2 sprays into both nostrils daily.   No facility-administered encounter medications on file as of 02/28/2018.     Activities of Daily Living In your present state of health, do you have any difficulty performing the following activities: 02/28/2018 11/10/2017  Hearing? N -  Comment mom states he does not wear hearing aids -  Vision? N -  Comment refuses to wear eyeglasses -  Difficulty concentrating or making decisions? N Y  Walking or climbing stairs? N -  Dressing or bathing? Y -  Comment mom runs bath water but pt able to bathe and clothe himself -  Doing errands, shopping? Y Y  Comment mom transports to appts [employer] and eating ? Y -  Comment denies dentures -  Using the  Toilet? N -  In the past six months, have you accidently leaked urine? N -  Do you have problems with loss of bowel control? N -  Managing your Medications? Y -  Comment mom manages medications -  Managing your Finances? Y -  Comment mom manages finances -  Housekeeping or managing your Housekeeping? Y -  Comment mom manages home -  Some recent data might be hidden    Patient Care Team: [name], MD as PCP - General (Family Medicine)   Assessment:   This is a routine wellness examination for [place].  Exercise Activities and Dietary recommendations Current Exercise Habits: The patient does not  participate in regular exercise at present, Exercise limited by: neurologic condition(s)(moderate mental retardation)  Goals    . DIET - INCREASE WATER INTAKE     Recommend to drink at least 6-8 8oz glasses of water per day.       Fall Risk Fall Risk  02/28/2018 11/10/2017 09/27/2017 09/06/2017 08/16/2017  Falls in the past year? Yes No Yes Yes Yes  Number falls in past yr: 1 - 1 1 1   Injury with Fall? No - Yes No No  Risk for fall due to : Other (Comment) - - - -  Risk for fall due to: Comment Moderate mental retardation - - - -  Follow up Falls evaluation completed;Education provided;Falls prevention discussed - - - -   Is the home free of loose throw rugs in walkways, pet beds, electrical cords, etc? Yes Adequate lighting to reduce risk of falls?  Yes In addition, does the patient have any of the following: Stairs in or around the home WITH handrails? Yes Grab bars in the bathroom? No  Shower chair or a place to sit while bathing? No Use of an elevated toilet seat or a handicapped toilet? No Use of a cane, walker or w/c? No  Timed Get Up and Go Performed: Yes. Pt ambulated 10 feet within 8 sec. Gait slow, steady and without the use of an assistive device. No intervention required at this time. Fall risk prevention has been discussed.  Mom declined my offer to send Community  Resource Referral to Care Guide for installation of grab bars in the shower, shower chair or an elevated toilet seat.  Depression Screen PHQ 2/9 Scores 02/28/2018 09/27/2017 09/06/2017 08/16/2017  PHQ - 2 Score - 0 0 0  Exception Documentation Medical reason - - -    Cognitive Function: Unable to perform. Moderate mental retardation.        Immunization History  Administered Date(s) Administered  . Influenza Split 10/06/2009, 07/21/2012  . Influenza, Seasonal, Injecte, Preservative Fre 06/23/2010  . Influenza,inj,Quad PF,6+ Mos 07/23/2013, 08/02/2014, 07/18/2015, 07/21/2016, 08/03/2017  . Influenza-Unspecified 08/02/2014  . Tdap 06/23/2010    Qualifies for Shingles Vaccine? No  Screening Tests Health Maintenance  Topic Date Due  . INFLUENZA VACCINE  06/01/2018  . TETANUS/TDAP  06/23/2020  . HIV Screening  Discontinued   Cancer Screenings: Lung: Low Dose CT Chest recommended if Age 67-80 years, 30 pack-year currently smoking OR have quit w/in 15years. Patient does not qualify. Colorectal: Does not qualify  Additional Screenings: Hepatitis C Screening: Does not qualify     Plan:  I have personally reviewed and addressed the Medicare Annual Wellness questionnaire and have noted the following in the patient's chart:  A. Medical and social history B. Use of alcohol, tobacco or illicit drugs  C. Current medications and supplements D. Functional ability and status E.  Nutritional status F.  Physical activity G. Advance directives H. List of other physicians I.  Hospitalizations, surgeries, and ER visits in previous 12 months J.  [place] such as hearing and vision if needed, cognitive and depression L. Referrals and appointments  In addition, I have reviewed and discussed with patient certain preventive protocols, quality metrics, and best practice recommendations. A written personalized care plan for preventive services as well as general preventive health  recommendations were provided to patient.  See attached scanned questionnaire for additional information.   Signed,  Aleatha Borer, LPN Nurse Health Advisor  I have reviewed this encounter including the documentation in this note and/or  discussed this patient with the provider, Aleatha Borer, LPN. I am certifying that I agree with the content of this note as supervising physician.  [name], MD [facility] 02/28/2018, 5:13 PM

## 2018-02-28 NOTE — Patient Instructions (Signed)
Alec Campbell , Thank you for taking time to come for your Medicare Wellness Visit. I appreciate your ongoing commitment to your health goals. Please review the following plan we discussed and let me know if I can assist you in the future.   Screening recommendations/referrals: Colorectal Screening: You do not qualify for this screening Lung Cancer Screening: You do not qualify for this screening Hepatitis C Screening: You do not qualify for this screening  Vision and Dental Exams: Recommended annual ophthalmology exams for early detection of glaucoma and other disorders of the eye Recommended annual dental exams for proper oral hygiene  Vaccinations: Influenza vaccine: Up to date Pneumococcal vaccine: You do not qualify for this vaccine Tdap vaccine: Up to date Shingles vaccine: You do not qualify for this vaccine    Advanced directives: Advance directive discussed with you today. I have provided a copy for you to complete at home and have notarized. Once this is complete please bring a copy in to our office so we can scan it into your chart.  Conditions/risks identified: Recommend to drink at least 6-8 8oz glasses of water per day.  Next appointment: Please schedule your Annual Wellness Visit with your Nurse Health Advisor in one year.  Preventive Care 40-64 Years, Male Preventive care refers to lifestyle choices and visits with your health care provider that can promote health and wellness. What does preventive care include?  A yearly physical exam. This is also called an annual well check.  Dental exams once or twice a year.  Routine eye exams. Ask your health care provider how often you should have your eyes checked.  Personal lifestyle choices, including:  Daily care of your teeth and gums.  Regular physical activity.  Eating a healthy diet.  Avoiding tobacco and drug use.  Limiting alcohol use.  Practicing safe sex.  Taking low-dose aspirin every day starting at  age 32. What happens during an annual well check? The services and screenings done by your health care provider during your annual well check will depend on your age, overall health, lifestyle risk factors, and family history of disease. Counseling  Your health care provider may ask you questions about your:  Alcohol use.  Tobacco use.  Drug use.  Emotional well-being.  Home and relationship well-being.  Sexual activity.  Eating habits.  Work and work Statistician. Screening  You may have the following tests or measurements:  Height, weight, and BMI.  Blood pressure.  Lipid and cholesterol levels. These may be checked every 5 years, or more frequently if you are over 47 years old.  Skin check.  Lung cancer screening. You may have this screening every year starting at age 34 if you have a 30-pack-year history of smoking and currently smoke or have quit within the past 15 years.  Fecal occult blood test (FOBT) of the stool. You may have this test every year starting at age 62.  Flexible sigmoidoscopy or colonoscopy. You may have a sigmoidoscopy every 5 years or a colonoscopy every 10 years starting at age 68.  Prostate cancer screening. Recommendations will vary depending on your family history and other risks.  Hepatitis C blood test.  Hepatitis B blood test.  Sexually transmitted disease (STD) testing.  Diabetes screening. This is done by checking your blood sugar (glucose) after you have not eaten for a while (fasting). You may have this done every 1-3 years. Discuss your test results, treatment options, and if necessary, the need for more tests with your health  care provider. Vaccines  Your health care provider may recommend certain vaccines, such as:  Influenza vaccine. This is recommended every year.  Tetanus, diphtheria, and acellular pertussis (Tdap, Td) vaccine. You may need a Td booster every 10 years.  Zoster vaccine. You may need this after age  2.  Pneumococcal 13-valent conjugate (PCV13) vaccine. You may need this if you have certain conditions and have not been vaccinated.  Pneumococcal polysaccharide (PPSV23) vaccine. You may need one or two doses if you smoke cigarettes or if you have certain conditions. Talk to your health care provider about which screenings and vaccines you need and how often you need them. This information is not intended to replace advice given to you by your health care provider. Make sure you discuss any questions you have with your health care provider. Document Released: 11/14/2015 Document Revised: 07/07/2016 Document Reviewed: 08/19/2015 Elsevier Interactive Patient Education  2017 [place] Prevention in the Home Falls can cause injuries. They can happen to people of all ages. There are many things you can do to make your home safe and to help prevent falls. What can I do on the outside of my home?  Regularly fix the edges of walkways and driveways and fix any cracks.  Remove anything that might make you trip as you walk through a door, such as a raised step or threshold.  Trim any bushes or trees on the path to your home.  Use bright outdoor lighting.  Clear any walking paths of anything that might make someone trip, such as rocks or tools.  Regularly check to see if handrails are loose or broken. Make sure that both sides of any steps have handrails.  Any raised decks and porches should have guardrails on the edges.  Have any leaves, snow, or ice cleared regularly.  Use sand or salt on walking paths during winter.  Clean up any spills in your garage right away. This includes oil or grease spills. What can I do in the bathroom?  Use night lights.  Install grab bars by the toilet and in the tub and shower. Do not use towel bars as grab bars.  Use non-skid mats or decals in the tub or shower.  If you need to sit down in the shower, use a plastic, non-slip stool.  Keep  the floor dry. Clean up any water that spills on the floor as soon as it happens.  Remove soap buildup in the tub or shower regularly.  Attach bath mats securely with double-sided non-slip rug tape.  Do not have throw rugs and other things on the floor that can make you trip. What can I do in the bedroom?  Use night lights.  Make sure that you have a light by your bed that is easy to reach.  Do not use any sheets or blankets that are too big for your bed. They should not hang down onto the floor.  Have a firm chair that has side arms. You can use this for support while you get dressed.  Do not have throw rugs and other things on the floor that can make you trip. What can I do in the kitchen?  Clean up any spills right away.  Avoid walking on wet floors.  Keep items that you use a lot in easy-to-reach places.  If you need to reach something above you, use a strong step stool that has a grab bar.  Keep electrical cords out of the way.  Do  not use floor polish or wax that makes floors slippery. If you must use wax, use non-skid floor wax.  Do not have throw rugs and other things on the floor that can make you trip. What can I do with my stairs?  Do not leave any items on the stairs.  Make sure that there are handrails on both sides of the stairs and use them. Fix handrails that are broken or loose. Make sure that handrails are as long as the stairways.  Check any carpeting to make sure that it is firmly attached to the stairs. Fix any carpet that is loose or worn.  Avoid having throw rugs at the top or bottom of the stairs. If you do have throw rugs, attach them to the floor with carpet tape.  Make sure that you have a light switch at the top of the stairs and the bottom of the stairs. If you do not have them, ask someone to add them for you. What else can I do to help prevent falls?  Wear shoes that:  Do not have high heels.  Have rubber bottoms.  Are comfortable and  fit you well.  Are closed at the toe. Do not wear sandals.  If you use a stepladder:  Make sure that it is fully opened. Do not climb a closed stepladder.  Make sure that both sides of the stepladder are locked into place.  Ask someone to hold it for you, if possible.  Clearly mark and make sure that you can see:  Any grab bars or handrails.  First and last steps.  Where the edge of each step is.  Use tools that help you move around (mobility aids) if they are needed. These include:  Canes.  Walkers.  Scooters.  Crutches.  Turn on the lights when you go into a dark area. Replace any light bulbs as soon as they burn out.  Set up your furniture so you have a clear path. Avoid moving your furniture around.  If any of your floors are uneven, fix them.  If there are any pets around you, be aware of where they are.  Review your medicines with your doctor. Some medicines can make you feel dizzy. This can increase your chance of falling. Ask your doctor what other things that you can do to help prevent falls. This information is not intended to replace advice given to you by your health care provider. Make sure you discuss any questions you have with your health care provider. Document Released: 08/14/2009 Document Revised: 03/25/2016 Document Reviewed: 11/22/2014 Elsevier Interactive Patient Education  2017 [employer].

## 2018-03-06 ENCOUNTER — Ambulatory Visit (INDEPENDENT_AMBULATORY_CARE_PROVIDER_SITE_OTHER): Payer: Medicare Other | Admitting: Family Medicine

## 2018-03-06 ENCOUNTER — Encounter: Payer: Self-pay | Admitting: Family Medicine

## 2018-03-06 VITALS — BP 116/68 | HR 69 | Temp 97.8°F | Resp 16 | Ht 70.0 in | Wt 173.2 lb

## 2018-03-06 DIAGNOSIS — J302 Other seasonal allergic rhinitis: Secondary | ICD-10-CM | POA: Diagnosis not present

## 2018-03-06 DIAGNOSIS — Z0000 Encounter for general adult medical examination without abnormal findings: Secondary | ICD-10-CM | POA: Diagnosis not present

## 2018-03-06 DIAGNOSIS — E785 Hyperlipidemia, unspecified: Secondary | ICD-10-CM | POA: Diagnosis not present

## 2018-03-06 DIAGNOSIS — E559 Vitamin D deficiency, unspecified: Secondary | ICD-10-CM

## 2018-03-06 DIAGNOSIS — G40909 Epilepsy, unspecified, not intractable, without status epilepticus: Secondary | ICD-10-CM

## 2018-03-06 MED ORDER — CARBAMAZEPINE 200 MG PO TABS: ORAL_TABLET | ORAL | 5 refills | Status: DC

## 2018-03-06 MED ORDER — PREVIDENT 5000 PLUS 1.1 % DT CREA: 1.0000 "application " | TOPICAL_CREAM | Freq: Two times a day (BID) | DENTAL | 5 refills | Status: DC

## 2018-03-06 MED ORDER — PHENOBARBITAL 64.8 MG PO TABS: 64.8000 mg | ORAL_TABLET | Freq: Two times a day (BID) | ORAL | 5 refills | Status: DC

## 2018-03-06 MED ORDER — PREVIDENT 5000 PLUS 1.1 % DT CREA: TOPICAL_CREAM | DENTAL | 5 refills | Status: DC

## 2018-03-06 NOTE — Patient Instructions (Signed)

## 2018-03-06 NOTE — Progress Notes (Signed)
Name: Alec Campbell   MRN: [id number]    DOB: November 02, 1970   Date:03/06/2018       Progress Note  Subjective  Chief Complaint  Chief Complaint  Patient presents with  . Annual Exam    Follow up from [place] visit on 02/28/18  . Follow-up    HPI  Annual Exam: recently seen by Wyn Quaker, LPN. Reviewed records from his medicare well. He has never been sexually active. No problems today, but needs refills of medications  TG:GYIR symptom at this time is runny and red eye, right worse than left, it is not matted. Taking Loratadine and other symptoms under control. Unchanged.   MR: he lives with his parents, he needs help with medication management, transportation and he is unable to manage his finances, also needs helps when seeing a physician.  Diagnosed as a toddler with developmental delay.  He seems to be happy and comfortable.   Seizure disorder: no symptoms in many years, mother is terrified of stopping his medication because his symptoms were severe. He had  hyponatremia and was seeingnephrologist, last labs back to normal. He is now on only two pill of phenobarbital and 4 pills of carbamazepine daily without any side effects. Reviewed labs done 09/2017 , we will recheck labs today, including Vitamin D level    Patient Active Problem List   Diagnosis Date Noted  . Cold sore 02/13/2016  . Vitamin D deficiency 07/20/2015  . Moderately mentally retarded 07/16/2015  . Seizure disorder ([place]) 07/16/2015  . Seasonal allergic rhinitis 07/16/2015  . Dyslipidemia 07/16/2015  . Hyponatremia 06/13/2015  . Abnormal blood sugar 06/19/2008    Past Surgical History:  Procedure Laterality Date  . ANKLE SURGERY Right   . CLEFT LIP REPAIR    . HERNIA REPAIR     Inguinal  . TYMPANOSTOMY TUBE PLACEMENT      Family History  Problem Relation Age of Onset  . Hypertension Mother   . Thyroid disease Mother   . CAD Father   . Hypertension Father   . Hyperlipidemia Father   . Healthy  Brother   . Healthy Sister   . Diabetes Brother   . COPD Brother   . Emphysema Brother   . Alcohol abuse Brother   . Hypertension Brother   . Aneurysm Brother        brain    Social History   Socioeconomic History  . Marital status: Single    Spouse name: Not on file  . Number of children: 0  . Years of education: Handicapped diploma  . Highest education level: Not on file  Occupational History    Employer: DISABLED  Social Needs  . Financial resource strain: Not hard at all  . Food insecurity:    Worry: Never true    Inability: Never true  . Transportation needs:    Medical: No    Non-medical: No  Tobacco Use  . Smoking status: Never Smoker  . Smokeless tobacco: Never Used  . Tobacco comment: smoking cessation materials not required  Substance and Sexual Activity  . Alcohol use: No    Alcohol/week: 0.0 oz  . Drug use: No  . Sexual activity: Never  Lifestyle  . Physical activity:    Days per week: 0 days    Minutes per session: 0 min  . Stress: Not at all  Relationships  . Social connections:    Talks on phone: Once a week    Gets together: More than three times a  week    Attends religious service: Never    Active member of club or organization: No    Attends meetings of clubs or organizations: Never    Relationship status: Never married  . Intimate partner violence:    Fear of current or ex partner: No    Emotionally abused: No    Physically abused: No    Forced sexual activity: No  Other Topics Concern  . Not on file  Social History Narrative   Moderate mental retardation. Parents are the primary caregiver, he lives with them     Current Outpatient Medications:  .  carbamazepine (EPITOL) 200 MG tablet, TAKE 1 TABLET BY MOUTH 4 TIMES DAILY, Disp: 120 tablet, Rfl: 5 .  loratadine (CLARITIN) 10 MG tablet, Take 1 tablet (10 mg total) by mouth daily., Disp: 30 tablet, Rfl: 2 .  PHENobarbital (LUMINAL) 64.8 MG tablet, Take 1 tablet (64.8 mg total) by  mouth 2 (two) times daily., Disp: 60 tablet, Rfl: 5 .  PREVIDENT 5000 PLUS 1.1 % CREA dental cream, Place 1 application onto teeth 2 (two) times daily., Disp: 1 Tube, Rfl: 5 .  valACYclovir (VALTREX) 1000 MG tablet, TAKE ONE TABLET BY MOUTH TWICE DAILY AS NEEDED FOR  OUTBREAK, Disp: 30 tablet, Rfl: 0  No Known Allergies   ROS  Constitutional: Negative for fever or weight change.  Respiratory: Negative for cough and shortness of breath.   Cardiovascular: Negative for chest pain or palpitations.  Gastrointestinal: Negative for abdominal pain, no bowel changes.  Musculoskeletal: Negative for gait problem or joint swelling.  Skin: Negative for rash.  Neurological: Negative for dizziness or headache.  No other specific complaints in a complete review of systems (except as listed in HPI above).  Objective  Vitals:   03/06/18 0904  BP: 116/68  Pulse: 69  Resp: 16  Temp: 97.8 F (36.6 C)  TempSrc: Oral  SpO2: 97%  Weight: 173 lb 3.2 oz (78.6 kg)  Height: 5\' 10"  (1.778 m)    Body mass index is 24.85 kg/m.  Physical Exam  Constitutional: Patient appears well-developed and well-nourished. No distress.  HENT: Head: Normocephalic and atraumatic. Ears: B TMs ok, no erythema or effusion; Nose: Nose normal.  He has history of cleft palate ( s/p repair)  Oropharynx is clear and moist. No oropharyngeal exudate.  Eyes: Conjunctivae and EOM are normal. Pupils are equal, round, and reactive to light. No scleral icterus.  Neck: Normal range of motion. Neck supple. No JVD present. No thyromegaly present.  Cardiovascular: Normal rate, regular rhythm and normal heart sounds.  No murmur heard. No BLE edema. Pulmonary/Chest: Effort normal and breath sounds normal. No respiratory distress. Abdominal: Soft. Bowel sounds are normal, no distension. There is no tenderness. no masses MALE GENITALIA: left testicle in the scrotum, right on the inguinal canal and moves down manually, no masses palpated,  right inguinal  hernia, no lesions, no discharge RECTAL:not done Musculoskeletal: No joint effusions. Decrease rom of left foot, worse than right side Neurological: he is alert and oriented to person, place, and time. No cranial nerve deficit. Coordination, balance, strength, speech and gait are normal.  Skin: Skin is warm and dry. No rash noted. No erythema.  Psychiatric: Happy and cooperative  PHQ2/9: Depression screen [facility] 2/9 02/28/2018 09/27/2017 09/06/2017 08/16/2017 04/21/2017  Decreased Interest - 0 0 0 0  Down, Depressed, Hopeless - 0 0 0 0  PHQ - 2 Score - 0 0 0 0  Difficult doing work/chores Not difficult at all - - - -  Fall Risk: Fall Risk  02/28/2018 11/10/2017 09/27/2017 09/06/2017 08/16/2017  Falls in the past year? Yes No Yes Yes Yes  Number falls in past yr: 1 - 1 1 1   Injury with Fall? No - Yes No No  Risk for fall due to : Other (Comment) - - - -  Risk for fall due to: Comment Moderate mental retardation - - - -  Follow up Falls evaluation completed;Education provided;Falls prevention discussed - - - -      Assessment & Plan  1. Encounter for routine history and physical exam for male  Discussed importance of 150 minutes of physical activity weekly, eat two servings of fish weekly, eat one serving of tree nuts ( cashews, pistachios, pecans, almonds.Marland Kitchen) every other day, eat 6 servings of fruit/vegetables daily and drink plenty of water and avoid sweet beverages.   2. Dyslipidemia  - Lipid panel  3. Seizure disorder (HCC)  - carbamazepine (EPITOL) 200 MG tablet; TAKE 1 TABLET BY MOUTH 4 TIMES DAILY  Dispense: 120 tablet; Refill: 5 - PHENobarbital (LUMINAL) 64.8 MG tablet; Take 1 tablet (64.8 mg total) by mouth 2 (two) times daily.  Dispense: 60 tablet; Refill: 5 - COMPLETE METABOLIC PANEL WITH GFR - CBC with Differential/Platelet - Phenobarbital level - Carbamazepine Level (Tegretol), total - VITAMIN D 25 Hydroxy (Vit-D Deficiency, Fractures)  4. Vitamin D  deficiency  - VITAMIN D 25 Hydroxy (Vit-D Deficiency, Fractures)  5. Seasonal allergic rhinitis, unspecified trigger

## 2018-03-08 LAB — COMPLETE METABOLIC PANEL WITH GFR
AG RATIO: 1.7 (calc) (ref 1.0–2.5)
ALBUMIN MSPROF: 4.5 g/dL (ref 3.6–5.1)
ALT: 9 U/L (ref 9–46)
AST: 14 U/L (ref 10–40)
Alkaline phosphatase (APISO): 105 U/L (ref 40–115)
BUN: 9 mg/dL (ref 7–25)
CALCIUM: 9.4 mg/dL (ref 8.6–10.3)
CHLORIDE: 100 mmol/L (ref 98–110)
CO2: 28 mmol/L (ref 20–32)
CREATININE: 0.87 mg/dL (ref 0.60–1.35)
GFR, EST NON AFRICAN AMERICAN: 102 mL/min/{1.73_m2} (ref >=60)
GFR, Est African American: 118 mL/min/{1.73_m2} (ref >=60)
GLOBULIN: 2.6 g/dL (ref 1.9–3.7)
Glucose, Bld: 91 mg/dL (ref 65–99)
Potassium: 4.1 mmol/L (ref 3.5–5.3)
SODIUM: 133 mmol/L — AB (ref 135–146)
Total Bilirubin: 0.4 mg/dL (ref 0.2–1.2)
Total Protein: 7.1 g/dL (ref 6.1–8.1)

## 2018-03-08 LAB — VITAMIN D 25 HYDROXY (VIT D DEFICIENCY, FRACTURES): Vit D, 25-Hydroxy: 29 ng/mL — ABNORMAL LOW (ref 30–100)

## 2018-03-08 LAB — CBC WITH DIFFERENTIAL/PLATELET
BASOS PCT: 0.9 %
Basophils Absolute: 58 cells/uL (ref 0–200)
Eosinophils Absolute: 371 cells/uL (ref 15–500)
Eosinophils Relative: 5.8 %
HEMATOCRIT: 38 % — AB (ref 38.5–50.0)
HEMOGLOBIN: 13.3 g/dL (ref 13.2–17.1)
LYMPHS ABS: 1984 {cells}/uL (ref 850–3900)
MCH: 30.1 pg (ref 27.0–33.0)
MCHC: 35 g/dL (ref 32.0–36.0)
MCV: 86 fL (ref 80.0–100.0)
MPV: 12.5 fL (ref 7.5–12.5)
Monocytes Relative: 4.9 %
NEUTROS ABS: 3674 {cells}/uL (ref 1500–7800)
Neutrophils Relative %: 57.4 %
Platelets: 165 10*3/uL (ref 140–400)
RBC: 4.42 10*6/uL (ref 4.20–5.80)
RDW: 12.5 % (ref 11.0–15.0)
Total Lymphocyte: 31 %
WBC mixed population: 314 cells/uL (ref 200–950)
WBC: 6.4 10*3/uL (ref 3.8–10.8)

## 2018-03-08 LAB — LIPID PANEL
CHOL/HDL RATIO: 4.4 (calc) (ref <5.0)
CHOLESTEROL: 225 mg/dL — AB (ref <200)
HDL: 51 mg/dL (ref >40)
LDL Cholesterol (Calc): 147 mg/dL (calc) — ABNORMAL HIGH
NON-HDL CHOLESTEROL (CALC): 174 mg/dL — AB (ref <130)
TRIGLYCERIDES: 141 mg/dL (ref <150)

## 2018-03-08 LAB — CARBAMAZEPINE LEVEL, TOTAL: Carbamazepine Lvl: 7.7 mg/L (ref 4.0–12.0)

## 2018-03-08 LAB — PHENOBARBITAL LEVEL: Phenobarbital, Serum: 24.3 mg/L (ref 15.0–40.0)

## 2018-05-06 ENCOUNTER — Other Ambulatory Visit: Payer: Self-pay | Admitting: Family Medicine

## 2018-05-06 DIAGNOSIS — G40909 Epilepsy, unspecified, not intractable, without status epilepticus: Secondary | ICD-10-CM

## 2018-06-04 ENCOUNTER — Other Ambulatory Visit: Payer: Self-pay | Admitting: Family Medicine

## 2018-06-04 DIAGNOSIS — J302 Other seasonal allergic rhinitis: Secondary | ICD-10-CM

## 2018-07-11 ENCOUNTER — Other Ambulatory Visit: Payer: Self-pay | Admitting: Family Medicine

## 2018-07-11 DIAGNOSIS — B001 Herpesviral vesicular dermatitis: Secondary | ICD-10-CM

## 2018-07-11 NOTE — Telephone Encounter (Signed)
Refill request for general medication. Valtrex to [facility].   Last office visit 03/06/2018   Follow up on 09/06/2018

## 2018-08-07 ENCOUNTER — Ambulatory Visit (INDEPENDENT_AMBULATORY_CARE_PROVIDER_SITE_OTHER): Payer: Medicare Other

## 2018-08-07 DIAGNOSIS — Z23 Encounter for immunization: Secondary | ICD-10-CM | POA: Diagnosis not present

## 2018-09-06 ENCOUNTER — Ambulatory Visit (INDEPENDENT_AMBULATORY_CARE_PROVIDER_SITE_OTHER): Payer: Medicare Other | Admitting: Family Medicine

## 2018-09-06 ENCOUNTER — Encounter: Payer: Self-pay | Admitting: Family Medicine

## 2018-09-06 VITALS — BP 118/70 | HR 76 | Temp 97.3°F | Resp 16 | Ht 70.0 in | Wt 173.8 lb

## 2018-09-06 DIAGNOSIS — G40909 Epilepsy, unspecified, not intractable, without status epilepticus: Secondary | ICD-10-CM | POA: Diagnosis not present

## 2018-09-06 DIAGNOSIS — E871 Hypo-osmolality and hyponatremia: Secondary | ICD-10-CM

## 2018-09-06 DIAGNOSIS — J302 Other seasonal allergic rhinitis: Secondary | ICD-10-CM | POA: Diagnosis not present

## 2018-09-06 DIAGNOSIS — E785 Hyperlipidemia, unspecified: Secondary | ICD-10-CM | POA: Diagnosis not present

## 2018-09-06 DIAGNOSIS — F79 Unspecified intellectual disabilities: Secondary | ICD-10-CM

## 2018-09-06 DIAGNOSIS — Z79899 Other long term (current) drug therapy: Secondary | ICD-10-CM

## 2018-09-06 DIAGNOSIS — E559 Vitamin D deficiency, unspecified: Secondary | ICD-10-CM | POA: Diagnosis not present

## 2018-09-06 MED ORDER — CARBAMAZEPINE 200 MG PO TABS: ORAL_TABLET | ORAL | 5 refills | Status: DC

## 2018-09-06 MED ORDER — LORATADINE 10 MG PO TABS: 10.0000 mg | ORAL_TABLET | Freq: Every day | ORAL | 5 refills | Status: DC

## 2018-09-06 MED ORDER — PHENOBARBITAL 64.8 MG PO TABS: 64.8000 mg | ORAL_TABLET | Freq: Two times a day (BID) | ORAL | 5 refills | Status: DC

## 2018-09-06 NOTE — Progress Notes (Signed)
Name: Alec Campbell   MRN: [id number]    DOB: 05-28-1971   Date:09/06/2018       Progress Note  Subjective  Chief Complaint  Chief Complaint  Patient presents with  . Medication Refill    6 month F/U  . Seizures  . Allergic Rhinitis     Has been sniffing and allergies are worst  . Dyslipidemia  . MR  . Fever Blister    Needs refill of his medicine has a ulcer in his mouth now    HPI   AR:he has noticed some clear rhinorrhea but no nasal congestion or sneezing, he does not like nasal spray but would like to resume loratadine.   Dyslipidemia: not on medication, last LDL 147, father has heart disease. No chest pain or palpitation  Fever blister: intermittent , has refills of valtrex at home  Intellectual disability with epilepsy:  he lives with his parents, he needs help with medication management, transportation and he is unable to manage his finances, also needs helps when seeing a physician.He developed seizures at 6 weeks, he was born with cleft lips and palate. He was diagnosed with developmental delay as a toddler.   Seizure disorder: no symptoms in many years, mother is terrified of stopping his medication because his symptoms were severe. He had  hyponatremia and was seeingnephrologist, last labs back to normal, we will recheck today.Marland Kitchen He is now on only two pill of phenobarbitaland 4 pills of carbamazepine daily without any side effects. Vitamin D was slightly low back in May we will recheck next year, continue otc supplementation   Patient Active Problem List   Diagnosis Date Noted  . Cold sore 02/13/2016  . Vitamin D deficiency 07/20/2015  . Intellectual disability with epilepsy ([place]) 07/16/2015  . Seizure disorder ([place]) 07/16/2015  . Seasonal allergic rhinitis 07/16/2015  . Dyslipidemia 07/16/2015  . Hyponatremia 06/13/2015  . Abnormal blood sugar 06/19/2008    Past Surgical History:  Procedure Laterality Date  . ANKLE SURGERY Right   . CLEFT LIP REPAIR     . HERNIA REPAIR     Inguinal  . TYMPANOSTOMY TUBE PLACEMENT      Family History  Problem Relation Age of Onset  . Hypertension Mother   . Thyroid disease Mother   . CAD Father   . Hypertension Father   . Hyperlipidemia Father   . Healthy Brother   . Healthy Sister   . Diabetes Brother   . COPD Brother   . Emphysema Brother   . Alcohol abuse Brother   . Hypertension Brother   . Aneurysm Brother        brain    Social History   Socioeconomic History  . Marital status: Single    Spouse name: Not on file  . Number of children: 0  . Years of education: Handicapped diploma  . Highest education level: Not on file  Occupational History    Employer: DISABLED  Social Needs  . Financial resource strain: Not hard at all  . Food insecurity:    Worry: Never true    Inability: Never true  . Transportation needs:    Medical: No    Non-medical: No  Tobacco Use  . Smoking status: Never Smoker  . Smokeless tobacco: Never Used  . Tobacco comment: smoking cessation materials not required  Substance and Sexual Activity  . Alcohol use: No    Alcohol/week: 0.0 standard drinks  . Drug use: No  . Sexual activity: Never  Lifestyle  .  Physical activity:    Days per week: 0 days    Minutes per session: 0 min  . Stress: Not at all  Relationships  . Social connections:    Talks on phone: Once a week    Gets together: More than three times a week    Attends religious service: Never    Active member of club or organization: No    Attends meetings of clubs or organizations: Never    Relationship status: Never married  . Intimate partner violence:    Fear of current or ex partner: No    Emotionally abused: No    Physically abused: No    Forced sexual activity: No  Other Topics Concern  . Not on file  Social History Narrative   Moderate mental retardation. Parents are the primary caregiver, he lives with them     Current Outpatient Medications:  .  carbamazepine (EPITOL) 200  MG tablet, TAKE 1 TABLET BY MOUTH 4 TIMES DAILY, Disp: 120 tablet, Rfl: 5 .  loratadine (CLARITIN) 10 MG tablet, Take 1 tablet (10 mg total) by mouth daily., Disp: 30 tablet, Rfl: 5 .  PHENobarbital (LUMINAL) 64.8 MG tablet, Take 1 tablet (64.8 mg total) by mouth 2 (two) times daily., Disp: 60 tablet, Rfl: 5 .  PREVIDENT 5000 PLUS 1.1 % CREA dental cream, Place 1 application onto teeth 2 (two) times daily., Disp: 1 Tube, Rfl: 5 .  valACYclovir (VALTREX) 1000 MG tablet, TAKE 1 TABLET BY MOUTH TWICE DAILY AS NEEDED FOR  OUTBREAK, Disp: 30 tablet, Rfl: 0  No Known Allergies  I personally reviewed active problem list, medication list, allergies, family history, social history with the patient/caregiver today.   ROS  Constitutional: Negative for fever or weight change.  Respiratory: Negative for cough and shortness of breath.   Cardiovascular: Negative for chest pain or palpitations.  Gastrointestinal: Negative for abdominal pain, no bowel changes.  Musculoskeletal: Negative for gait problem or joint swelling.  Skin: Negative for rash.  Neurological: Negative for dizziness or headache.  No other specific complaints in a complete review of systems (except as listed in HPI above).  Objective  Vitals:   09/06/18 0849  BP: 118/70  Pulse: 76  Resp: 16  Temp: (!) 97.3 F (36.3 C)  TempSrc: Oral  SpO2: 99%  Weight: 173 lb 12.8 oz (78.8 kg)  Height: 5\' 10"  (1.778 m)    Body mass index is 24.94 kg/m.  Physical Exam  Constitutional: Patient appears well-developed and well-nourished.  No distress.  HEENT: head atraumatic, normocephalic, pupils equal and reactive to light,  neck supple, throat within normal limits, history of cleft palate repair  Cardiovascular: Normal rate, regular rhythm and normal heart sounds.  No murmur heard. No BLE edema. Pulmonary/Chest: Effort normal and breath sounds normal. No respiratory distress. Abdominal: Soft.  There is no tenderness. Psychiatric:  Patient has a normal mood and affect. behavior is normal. Judgment and thought content normal.  PHQ2/9: Depression screen [facility] 2/9 02/28/2018 09/27/2017 09/06/2017 08/16/2017 04/21/2017  Decreased Interest - 0 0 0 0  Down, Depressed, Hopeless - 0 0 0 0  PHQ - 2 Score - 0 0 0 0  Difficult doing work/chores Not difficult at all - - - -    Fall Risk: Fall Risk  09/06/2018 02/28/2018 11/10/2017 09/27/2017 09/06/2017  Falls in the past year? 0 Yes No Yes Yes  Number falls in past yr: 0 1 - 1 1  Injury with Fall? 0 No - Yes No  Risk for fall due to : - Other (Comment) - - -  Risk for fall due to: Comment - Moderate mental retardation - - -  Follow up - Falls evaluation completed;Education provided;Falls prevention discussed - - -     Functional Status Survey: Is the patient deaf or have difficulty hearing?: No Does the patient have difficulty seeing, even when wearing glasses/contacts?: No Does the patient have difficulty concentrating, remembering, or making decisions?: Yes Does the patient have difficulty walking or climbing stairs?: No Does the patient have difficulty dressing or bathing?: No Does the patient have difficulty doing errands alone such as visiting a doctor's office or shopping?: Yes    Assessment & Plan  1. Seasonal allergic rhinitis, unspecified trigger  - loratadine (CLARITIN) 10 MG tablet; Take 1 tablet (10 mg total) by mouth daily.  Dispense: 30 tablet; Refill: 5  2. Seizure disorder (HCC)  - carbamazepine (EPITOL) 200 MG tablet; TAKE 1 TABLET BY MOUTH 4 TIMES DAILY  Dispense: 120 tablet; Refill: 5 - PHENobarbital (LUMINAL) 64.8 MG tablet; Take 1 tablet (64.8 mg total) by mouth 2 (two) times daily.  Dispense: 60 tablet; Refill: 5 - Phenobarbital level - Carbamazepine Level (Tegretol), total  3. Vitamin D deficiency  Continue supplementation and recheck next visit   4. Dyslipidemia  Changing his diet since father had bypass surgery   5. Hyponatremia  -  COMPLETE METABOLIC PANEL WITH GFR  6. Long-term use of high-risk medication  - COMPLETE METABOLIC PANEL WITH GFR  7. Intellectual disability with epilepsy ([place])  On medication , no recent episodes

## 2018-09-08 LAB — COMPLETE METABOLIC PANEL WITH GFR
AG Ratio: 1.9 (calc) (ref 1.0–2.5)
ALBUMIN MSPROF: 4.8 g/dL (ref 3.6–5.1)
ALT: 12 U/L (ref 9–46)
AST: 17 U/L (ref 10–40)
Alkaline phosphatase (APISO): 139 U/L — ABNORMAL HIGH (ref 40–115)
BILIRUBIN TOTAL: 0.4 mg/dL (ref 0.2–1.2)
BUN: 10 mg/dL (ref 7–25)
CALCIUM: 9.4 mg/dL (ref 8.6–10.3)
CHLORIDE: 98 mmol/L (ref 98–110)
CO2: 27 mmol/L (ref 20–32)
CREATININE: 0.83 mg/dL (ref 0.60–1.35)
GFR, Est African American: 121 mL/min/{1.73_m2} (ref >=60)
GFR, Est Non African American: 104 mL/min/{1.73_m2} (ref >=60)
GLOBULIN: 2.5 g/dL (ref 1.9–3.7)
Glucose, Bld: 79 mg/dL (ref 65–99)
Potassium: 3.9 mmol/L (ref 3.5–5.3)
Sodium: 134 mmol/L — ABNORMAL LOW (ref 135–146)
Total Protein: 7.3 g/dL (ref 6.1–8.1)

## 2018-09-08 LAB — CARBAMAZEPINE LEVEL, TOTAL: CARBAMAZEPINE LVL: 9.4 mg/L (ref 4.0–12.0)

## 2018-09-08 LAB — PHENOBARBITAL LEVEL: Phenobarbital, Serum: 25.5 mg/L (ref 15.0–40.0)

## 2018-12-17 IMAGING — DX DG TIBIA/FIBULA 2V*R*
2 series · 2 of 2 positions shown · non-contrast
Comparison: None.

CLINICAL DATA: Right knee pain after fall.

EXAM:
RIGHT TIBIA AND FIBULA - 2 VIEW

[tibia ap]
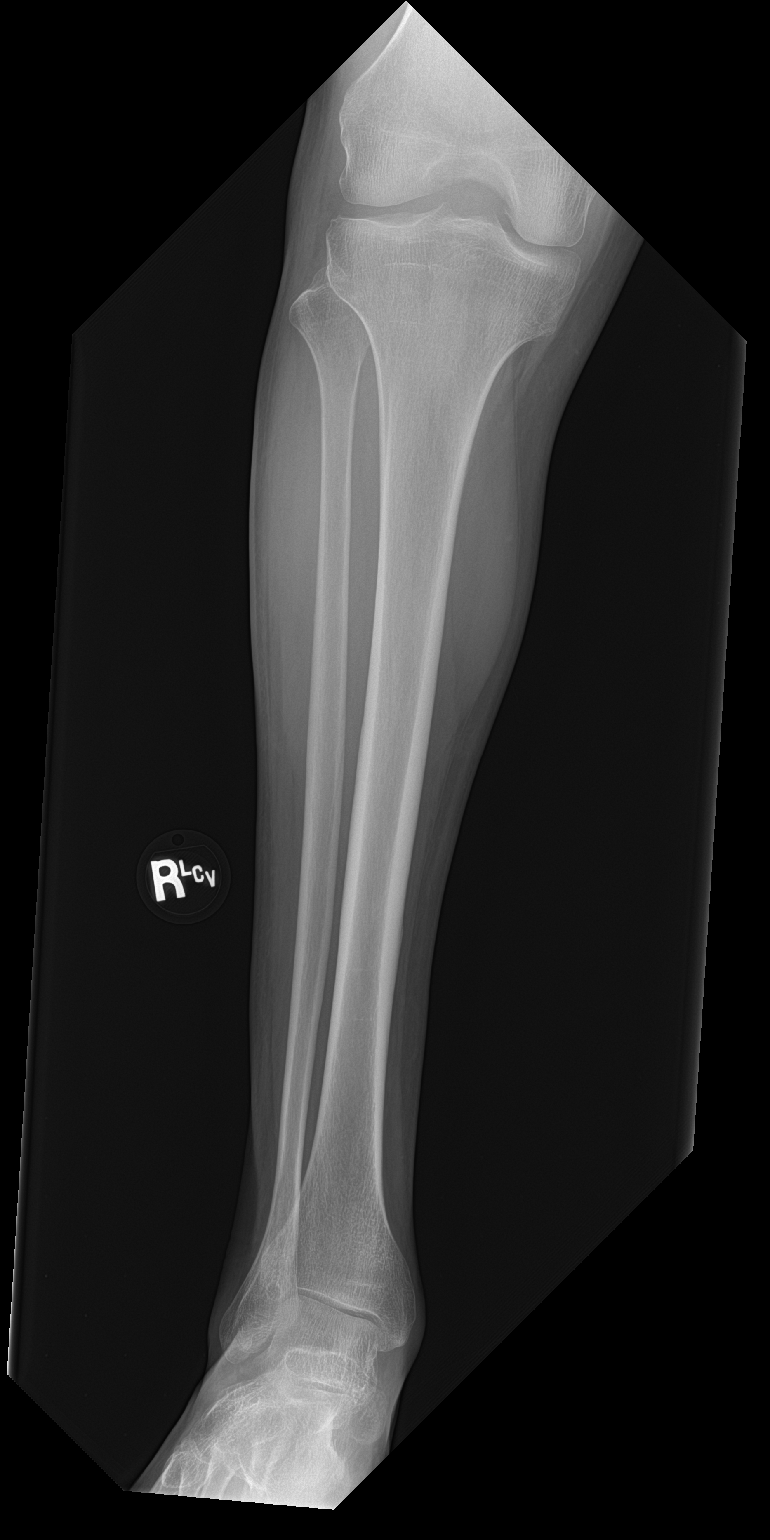

[tibia lat]
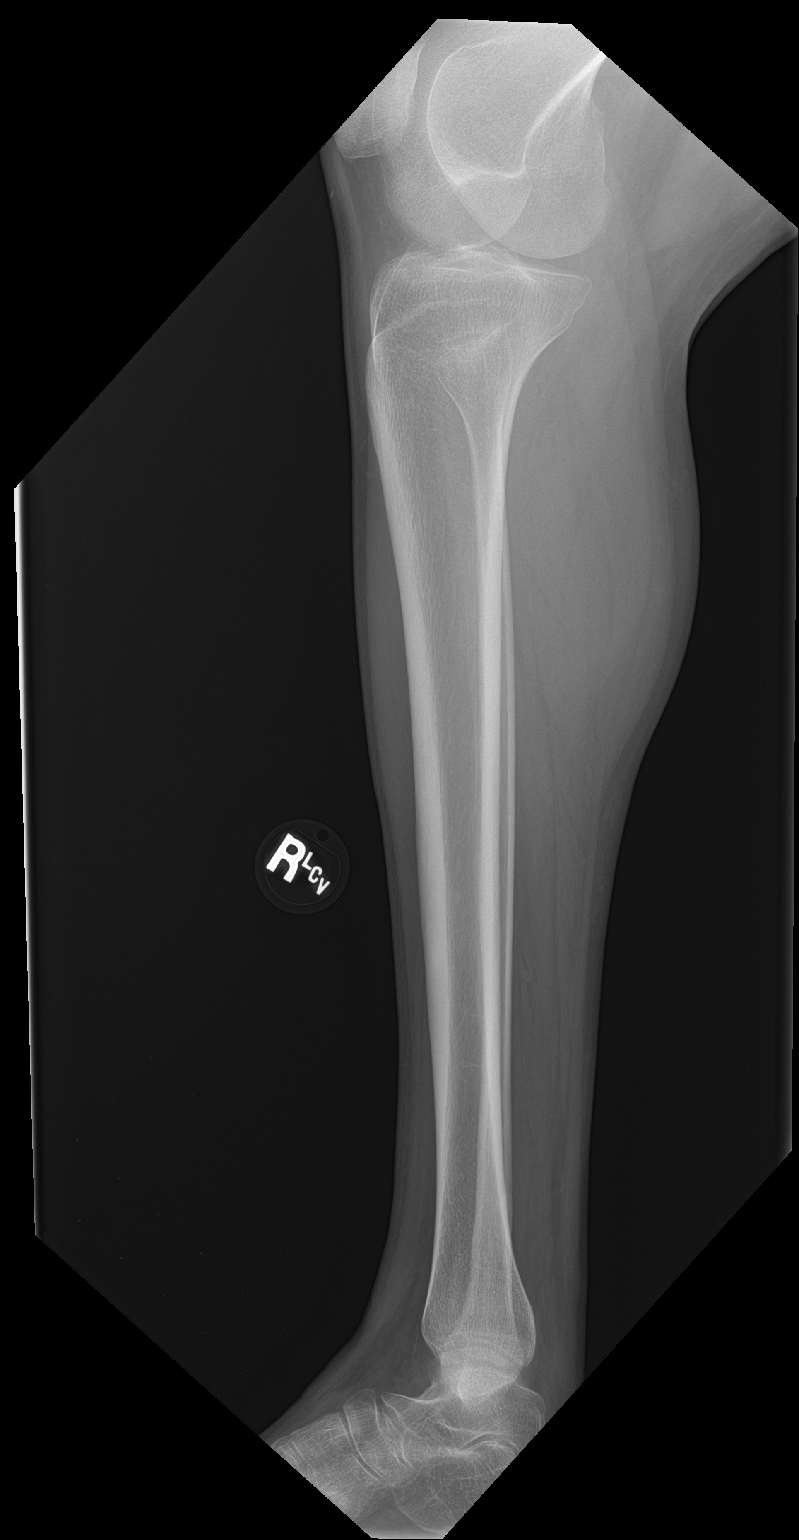

[2 of 2 positions shown; findings below may reference images not displayed]

FINDINGS: There is no evidence of fracture or other focal bone lesions.
Osteopenia. Soft tissues are unremarkable.
IMPRESSION: Negative.

## 2019-01-02 ENCOUNTER — Telehealth: Payer: Self-pay | Admitting: Family Medicine

## 2019-01-02 NOTE — Telephone Encounter (Signed)
He is not high risk and does not need medication, monitor for symptoms

## 2019-01-02 NOTE — Telephone Encounter (Signed)
Copied from [place] [id number]. Topic: General - Inquiry >> Jan 02, 2019  2:06 PM Margot Ables wrote: Pt nephew was staying in the home with him and his parents Friday and Saturday when he had a fever. The nephew has been DX with the flu today. Gustavo does not currently have symptoms. Requesting Tamiflu/Zofluza.  [id number] - [place], [place] - [address] [phone] (Phone) [phone] (Fax)

## 2019-01-03 NOTE — Telephone Encounter (Signed)
Patient mother was given information about [place] not needing Tamiflu due to not being high risk.

## 2019-01-22 ENCOUNTER — Ambulatory Visit
Admission: RE | Admit: 2019-01-22 | Discharge: 2019-01-22 | Disposition: A | Discharge status: Home / Self Care | Payer: Medicare Other | Source: Ambulatory Visit | Attending: Family Medicine | Admitting: Family Medicine

## 2019-01-22 ENCOUNTER — Ambulatory Visit
Admission: RE | Admit: 2019-01-22 | Discharge: 2019-01-22 | Disposition: A | Discharge status: Home / Self Care | Payer: Medicare Other | Attending: Family Medicine | Admitting: Family Medicine

## 2019-01-22 ENCOUNTER — Ambulatory Visit (INDEPENDENT_AMBULATORY_CARE_PROVIDER_SITE_OTHER): Payer: Medicare Other | Admitting: Family Medicine

## 2019-01-22 ENCOUNTER — Telehealth: Payer: Self-pay | Admitting: Family Medicine

## 2019-01-22 ENCOUNTER — Other Ambulatory Visit: Payer: Self-pay

## 2019-01-22 ENCOUNTER — Encounter: Payer: Self-pay | Admitting: Family Medicine

## 2019-01-22 VITALS — BP 130/80 | HR 117 | Temp 97.9°F | Resp 16 | Ht 70.0 in | Wt 168.9 lb

## 2019-01-22 DIAGNOSIS — R5383 Other fatigue: Secondary | ICD-10-CM | POA: Diagnosis not present

## 2019-01-22 DIAGNOSIS — J302 Other seasonal allergic rhinitis: Secondary | ICD-10-CM

## 2019-01-22 DIAGNOSIS — B001 Herpesviral vesicular dermatitis: Secondary | ICD-10-CM

## 2019-01-22 DIAGNOSIS — R509 Fever, unspecified: Secondary | ICD-10-CM | POA: Insufficient documentation

## 2019-01-22 DIAGNOSIS — R630 Anorexia: Secondary | ICD-10-CM

## 2019-01-22 DIAGNOSIS — R0989 Other specified symptoms and signs involving the circulatory and respiratory systems: Secondary | ICD-10-CM | POA: Insufficient documentation

## 2019-01-22 MED ORDER — VALACYCLOVIR HCL 1 G PO TABS: ORAL_TABLET | ORAL | 0 refills | Status: DC

## 2019-01-22 MED ORDER — LORATADINE 10 MG PO TABS: 10.0000 mg | ORAL_TABLET | Freq: Every day | ORAL | 5 refills | Status: DC

## 2019-01-22 NOTE — Telephone Encounter (Signed)
Copied from [place] [phone]. Topic: Quick Communication - See Telephone Encounter >> Jan 22, 2019  2:48 PM Burchel, Abbi R wrote: CRM for notification. See Telephone encounter for: 01/22/19.  Pt's mother states pt has had a rash between all 4 fingers on both hands for several days.  Pt's mother forgot to mention it during appt today.   [name]: [phone]

## 2019-01-22 NOTE — Telephone Encounter (Signed)
Can she send me a picture?

## 2019-01-22 NOTE — Telephone Encounter (Signed)
Patient mother [name] and she is going to try to go online and share the picture of Unnamed hand to you on MyChart.

## 2019-01-22 NOTE — Progress Notes (Signed)
Name: Alec Campbell   MRN: [id number]    DOB: [birth year]   Date:01/22/2019       Progress Note  Subjective  Chief Complaint  Chief Complaint  Patient presents with  . Fever    HPI  Low grade fever/lack of appetite: he was exposed to the flu two weeks ago , he is a poor historian, he complained that he was hot three days ago, mother checked his temperature and it was around 100, he also has lack of appetite, mild rhinorrhea ( but it is allergy season) , no change in bowel movements, no urinary frequency or dysuria Alec Campbell. He slept until 1 pm yesterday. His behavior is normal. No rashes, no cough , wheezing or SOB. No sick contacts at home recently and no travel.    Patient Active Problem List   Diagnosis Date Noted  . Cold sore 02/13/2016  . Vitamin D deficiency 07/20/2015  . Intellectual disability with epilepsy ([place]) 07/16/2015  . Seizure disorder ([place]) 07/16/2015  . Seasonal allergic rhinitis 07/16/2015  . Dyslipidemia 07/16/2015  . Hyponatremia 06/13/2015  . Abnormal blood sugar 06/19/2008    Past Surgical History:  Procedure Laterality Date  . ANKLE SURGERY Right   . CLEFT LIP REPAIR    . HERNIA REPAIR     Inguinal  . TYMPANOSTOMY TUBE PLACEMENT      Family History  Problem Relation Age of Onset  . Hypertension Mother   . Thyroid disease Mother   . CAD Father   . Hypertension Father   . Hyperlipidemia Father   . Healthy Brother   . Healthy Sister   . Diabetes Brother   . COPD Brother   . Emphysema Brother   . Alcohol abuse Brother   . Hypertension Brother   . Aneurysm Brother        brain    Social History   Socioeconomic History  . Marital status: Single    Spouse name: Not on file  . Number of children: 0  . Years of education: Handicapped diploma  . Highest education level: Not on file  Occupational History    Employer: DISABLED  Social Needs  . Financial resource strain: Not hard at all  . Food insecurity:    Worry: Never true     Inability: Never true  . Transportation needs:    Medical: No    Non-medical: No  Tobacco Use  . Smoking status: Never Smoker  . Smokeless tobacco: Never Used  . Tobacco comment: smoking cessation materials not required  Substance and Sexual Activity  . Alcohol use: No    Alcohol/week: 0.0 standard drinks  . Drug use: No  . Sexual activity: Never  Lifestyle  . Physical activity:    Days per week: 0 days    Minutes per session: 0 min  . Stress: Not at all  Relationships  . Social connections:    Talks on phone: Once a week    Gets together: More than three times a week    Attends religious service: Never    Active member of club or organization: No    Attends meetings of clubs or organizations: Never    Relationship status: Never married  . Intimate partner violence:    Fear of current or ex partner: No    Emotionally abused: No    Physically abused: No    Forced sexual activity: No  Other Topics Concern  . Not on file  Social History Narrative   Moderate mental retardation.  Parents are the primary caregiver, he lives with them     Current Outpatient Medications:  .  carbamazepine (EPITOL) 200 MG tablet, TAKE 1 TABLET BY MOUTH 4 TIMES DAILY, Disp: 120 tablet, Rfl: 5 .  loratadine (CLARITIN) 10 MG tablet, Take 1 tablet (10 mg total) by mouth daily., Disp: 30 tablet, Rfl: 5 .  PHENobarbital (LUMINAL) 64.8 MG tablet, Take 1 tablet (64.8 mg total) by mouth 2 (two) times daily., Disp: 60 tablet, Rfl: 5 .  PREVIDENT 5000 PLUS 1.1 % CREA dental cream, Place 1 application onto teeth 2 (two) times daily., Disp: 1 Tube, Rfl: 5 .  valACYclovir (VALTREX) 1000 MG tablet, TAKE 1 TABLET BY MOUTH TWICE DAILY AS NEEDED FOR  OUTBREAK, Disp: 30 tablet, Rfl: 0  No Known Allergies  I personally reviewed active problem list, medication list, allergies, family history, social history with the patient/caregiver today.   ROS  Ten systems reviewed and is negative except as mentioned in HPI    Objective  Vitals:   01/22/19 1200  BP: 130/80  Pulse: (!) 117  Resp: 16  Temp: 97.9 F (36.6 C)  TempSrc: Oral  SpO2: 100%  Weight: 168 lb 14.4 oz (76.6 kg)  Height: 5\' 10"  (1.778 m)    Body mass index is 24.23 kg/m.  Physical Exam  Constitutional: Patient appears well-developed and well-nourished.  No distress.  HEENT: head atraumatic, normocephalic, pupils equal and reactive to light, ears :normal TM neck supple, throat within normal limits Cardiovascular: Normal rate, regular rhythm and normal heart sounds.  No murmur heard. No BLE edema. Pulmonary/Chest: Effort normal and breath sounds normal. No respiratory distress. Abdominal: Soft.  There is no tenderness. Psychiatric: Patient has a normal mood and affect. behavior is normal. Judgment and thought content normal.  PHQ2/9: Depression screen [facility] 2/9 02/28/2018 09/27/2017 09/06/2017 08/16/2017 04/21/2017  Decreased Interest - 0 0 0 0  Down, Depressed, Hopeless - 0 0 0 0  PHQ - 2 Score - 0 0 0 0  Difficult doing work/chores Not difficult at all - - - -   Unable to answer   Fall Risk: Fall Risk  09/06/2018 02/28/2018 11/10/2017 09/27/2017 09/06/2017  Falls in the past year? 0 Yes No Yes Yes  Number falls in past yr: 0 1 - 1 1  Injury with Fall? 0 No - Yes No  Risk for fall due to : - Other (Comment) - - -  Risk for fall due to: Comment - Moderate mental retardation - - -  Follow up - Falls evaluation completed;Education provided;Falls prevention discussed - - -     Assessment & Plan  1. Seasonal allergic rhinitis, unspecified trigger  - loratadine (CLARITIN) 10 MG tablet; Take 1 tablet (10 mg total) by mouth daily.  Dispense: 30 tablet; Refill: 5  2. Lack of appetite  - COMPLETE METABOLIC PANEL WITH GFR - CBC with Differential/Platelet - CULTURE, URINE COMPREHENSIVE  3. Cold sore  - valACYclovir (VALTREX) 1000 MG tablet; TAKE 1 TABLET BY MOUTH TWICE DAILY AS NEEDED FOR  OUTBREAK  Dispense: 30 tablet;  Refill: 0  4. Low grade fever  - COMPLETE METABOLIC PANEL WITH GFR - CBC with Differential/Platelet - CULTURE, URINE COMPREHENSIVE - DG Chest 2 View; Future  5. Rhonchi at both lung bases  - DG Chest 2 View; Future

## 2019-01-23 ENCOUNTER — Other Ambulatory Visit: Payer: Self-pay | Admitting: Family Medicine

## 2019-01-23 DIAGNOSIS — R630 Anorexia: Secondary | ICD-10-CM

## 2019-01-23 DIAGNOSIS — D72828 Other elevated white blood cell count: Secondary | ICD-10-CM

## 2019-01-24 ENCOUNTER — Other Ambulatory Visit
Admission: RE | Admit: 2019-01-24 | Discharge: 2019-01-24 | Disposition: A | Discharge status: Home / Self Care | Payer: Medicare Other | Source: Ambulatory Visit | Attending: Family Medicine | Admitting: Family Medicine

## 2019-01-24 ENCOUNTER — Encounter: Payer: Self-pay | Admitting: Family Medicine

## 2019-01-24 ENCOUNTER — Other Ambulatory Visit: Payer: Self-pay | Admitting: Family Medicine

## 2019-01-24 ENCOUNTER — Other Ambulatory Visit: Payer: Self-pay

## 2019-01-24 ENCOUNTER — Ambulatory Visit (INDEPENDENT_AMBULATORY_CARE_PROVIDER_SITE_OTHER): Payer: Medicare Other | Admitting: Family Medicine

## 2019-01-24 VITALS — BP 120/80 | HR 111 | Temp 98.3°F | Resp 16 | Ht 70.0 in | Wt 168.6 lb

## 2019-01-24 DIAGNOSIS — E871 Hypo-osmolality and hyponatremia: Secondary | ICD-10-CM | POA: Diagnosis not present

## 2019-01-24 DIAGNOSIS — R700 Elevated erythrocyte sedimentation rate: Secondary | ICD-10-CM | POA: Diagnosis not present

## 2019-01-24 DIAGNOSIS — D72829 Elevated white blood cell count, unspecified: Secondary | ICD-10-CM

## 2019-01-24 DIAGNOSIS — R630 Anorexia: Secondary | ICD-10-CM | POA: Diagnosis not present

## 2019-01-24 DIAGNOSIS — N390 Urinary tract infection, site not specified: Secondary | ICD-10-CM | POA: Diagnosis not present

## 2019-01-24 DIAGNOSIS — E876 Hypokalemia: Secondary | ICD-10-CM | POA: Insufficient documentation

## 2019-01-24 DIAGNOSIS — D72828 Other elevated white blood cell count: Secondary | ICD-10-CM | POA: Insufficient documentation

## 2019-01-24 LAB — CBC WITH DIFFERENTIAL/PLATELET
Abs Immature Granulocytes: 0.07 10*3/uL (ref 0.00–0.07)
Absolute Monocytes: 1436 cells/uL — ABNORMAL HIGH (ref 200–950)
BASOS ABS: 52 {cells}/uL (ref 0–200)
BASOS PCT: 0.3 %
Basophils Absolute: 0.1 10*3/uL (ref 0.0–0.1)
Basophils Relative: 0 %
EOS PCT: 0.5 %
Eosinophils Absolute: 87 cells/uL (ref 15–500)
Eosinophils Absolute: 0.2 10*3/uL (ref 0.0–0.5)
Eosinophils Relative: 2 %
HCT: 35.9 % — ABNORMAL LOW (ref 39.0–52.0)
HEMATOCRIT: 37.2 % — AB (ref 38.5–50.0)
HEMOGLOBIN: 12.8 g/dL — AB (ref 13.2–17.1)
Hemoglobin: 12.5 g/dL — ABNORMAL LOW (ref 13.0–17.0)
IMMATURE GRANULOCYTES: 1 %
Lymphocytes Relative: 16 %
Lymphs Abs: 1592 cells/uL (ref 850–3900)
Lymphs Abs: 2.2 10*3/uL (ref 0.7–4.0)
MCH: 29.2 pg (ref 27.0–33.0)
MCH: 29.8 pg (ref 26.0–34.0)
MCHC: 34.4 g/dL (ref 32.0–36.0)
MCHC: 34.8 g/dL (ref 30.0–36.0)
MCV: 84.9 fL (ref 80.0–100.0)
MCV: 85.7 fL (ref 80.0–100.0)
MONOS PCT: 8.3 %
MONOS PCT: 9 %
MPV: 12.8 fL — ABNORMAL HIGH (ref 7.5–12.5)
Monocytes Absolute: 1.2 10*3/uL — ABNORMAL HIGH (ref 0.1–1.0)
NEUTROS PCT: 72 %
Neutro Abs: 14134 cells/uL — ABNORMAL HIGH (ref 1500–7800)
Neutro Abs: 9.7 10*3/uL — ABNORMAL HIGH (ref 1.7–7.7)
Neutrophils Relative %: 81.7 %
Platelets: 200 10*3/uL (ref 140–400)
Platelets: 253 10*3/uL (ref 150–400)
RBC: 4.38 10*6/uL (ref 4.20–5.80)
RBC: 4.19 MIL/uL — ABNORMAL LOW (ref 4.22–5.81)
RDW: 12.4 % (ref 11.0–15.0)
RDW: 12 % (ref 11.5–15.5)
Total Lymphocyte: 9.2 %
WBC: 17.3 10*3/uL — AB (ref 3.8–10.8)
WBC: 13.5 10*3/uL — ABNORMAL HIGH (ref 4.0–10.5)
nRBC: 0 % (ref 0.0–0.2)

## 2019-01-24 LAB — BASIC METABOLIC PANEL
Anion gap: 11 (ref 5–15)
BUN: 9 mg/dL (ref 6–20)
CO2: 27 mmol/L (ref 22–32)
Calcium: 9.4 mg/dL (ref 8.9–10.3)
Chloride: 94 mmol/L — ABNORMAL LOW (ref 98–111)
Creatinine, Ser: 0.77 mg/dL (ref 0.61–1.24)
GFR calc Af Amer: >60 mL/min (ref >60)
GFR calc non Af Amer: >60 mL/min (ref >60)
Glucose, Bld: 93 mg/dL (ref 70–99)
Potassium: 3.2 mmol/L — ABNORMAL LOW (ref 3.5–5.1)
Sodium: 132 mmol/L — ABNORMAL LOW (ref 135–145)

## 2019-01-24 LAB — COMPLETE METABOLIC PANEL WITH GFR
AG Ratio: 1.4 (calc) (ref 1.0–2.5)
ALKALINE PHOSPHATASE (APISO): 142 U/L — AB (ref 36–130)
ALT: 12 U/L (ref 9–46)
AST: 15 U/L (ref 10–40)
Albumin: 4.1 g/dL (ref 3.6–5.1)
BUN: 13 mg/dL (ref 7–25)
CO2: 24 mmol/L (ref 20–32)
CREATININE: 0.95 mg/dL (ref 0.60–1.35)
Calcium: 9.7 mg/dL (ref 8.6–10.3)
Chloride: 93 mmol/L — ABNORMAL LOW (ref 98–110)
GFR, Est African American: 109 mL/min/{1.73_m2} (ref >=60)
GFR, Est Non African American: 94 mL/min/{1.73_m2} (ref >=60)
GLUCOSE: 120 mg/dL — AB (ref 65–99)
Globulin: 3 g/dL (calc) (ref 1.9–3.7)
Potassium: 3.7 mmol/L (ref 3.5–5.3)
SODIUM: 131 mmol/L — AB (ref 135–146)
Total Bilirubin: 0.6 mg/dL (ref 0.2–1.2)
Total Protein: 7.1 g/dL (ref 6.1–8.1)

## 2019-01-24 LAB — CULTURE, URINE COMPREHENSIVE
MICRO NUMBER: 348363
SPECIMEN QUALITY:: ADEQUATE

## 2019-01-24 LAB — SEDIMENTATION RATE: Sed Rate: 87 mm/hr — ABNORMAL HIGH (ref 0–15)

## 2019-01-24 LAB — C-REACTIVE PROTEIN: CRP: 17.4 mg/dL — ABNORMAL HIGH (ref <1.0)

## 2019-01-24 MED ORDER — POTASSIUM CHLORIDE CRYS ER 20 MEQ PO TBCR: 20.0000 meq | EXTENDED_RELEASE_TABLET | Freq: Every day | ORAL | 0 refills | Status: DC

## 2019-01-24 MED ORDER — DOXYCYCLINE HYCLATE 100 MG PO TABS: 100.0000 mg | ORAL_TABLET | Freq: Two times a day (BID) | ORAL | 0 refills | Status: DC

## 2019-01-24 NOTE — Progress Notes (Signed)
Name: Alec Campbell   MRN: [id number]    DOB: 06/27/1971   Date:01/24/2019       Progress Note  Subjective  Chief Complaint  Chief Complaint  Patient presents with  . Follow-up    2 day     HPI  UTI: he was seen earlier this week because mother noticed he was sleeping more than usual , fever and not having any appetite over the weekend. Tmax was 101.2 twice. He had labs done two days ago and WBC was 17 k, also showed hyponatremia. He does not have cough, sob or wheezing, he is feeling well today, ate fish sandwich for lunch. He is poor historian because of MR, mother came in with him today. Reviewed labs, we will replace potassium repeat labs today, explained sed rate also very high. We will start antibiotics and return or go to [facility] if he gets worse. High temperatures resolved a couple of days ago.    Rash: on web of fingers improving with hydrocortisone, it is not itchy, not bothering him   Patient Active Problem List   Diagnosis Date Noted  . Cold sore 02/13/2016  . Vitamin D deficiency 07/20/2015  . Intellectual disability with epilepsy ([place]) 07/16/2015  . Seizure disorder ([place]) 07/16/2015  . Seasonal allergic rhinitis 07/16/2015  . Dyslipidemia 07/16/2015  . Hyponatremia 06/13/2015  . Abnormal blood sugar 06/19/2008    Social History   Tobacco Use  . Smoking status: Never Smoker  . Smokeless tobacco: Never Used  . Tobacco comment: smoking cessation materials not required  Substance Use Topics  . Alcohol use: No    Alcohol/week: 0.0 standard drinks     Current Outpatient Medications:  .  carbamazepine (EPITOL) 200 MG tablet, TAKE 1 TABLET BY MOUTH 4 TIMES DAILY, Disp: 120 tablet, Rfl: 5 .  loratadine (CLARITIN) 10 MG tablet, Take 1 tablet (10 mg total) by mouth daily., Disp: 30 tablet, Rfl: 5 .  PHENobarbital (LUMINAL) 64.8 MG tablet, Take 1 tablet (64.8 mg total) by mouth 2 (two) times daily., Disp: 60 tablet, Rfl: 5 .  PREVIDENT 5000 PLUS 1.1 % CREA dental cream, Place  1 application onto teeth 2 (two) times daily., Disp: 1 Tube, Rfl: 5 .  valACYclovir (VALTREX) 1000 MG tablet, TAKE 1 TABLET BY MOUTH TWICE DAILY AS NEEDED FOR  OUTBREAK, Disp: 30 tablet, Rfl: 0 .  doxycycline (VIBRA-TABS) 100 MG tablet, Take 1 tablet (100 mg total) by mouth 2 (two) times daily., Disp: 14 tablet, Rfl: 0 .  potassium chloride SA (K-DUR,KLOR-CON) 20 MEQ tablet, Take 1 tablet (20 mEq total) by mouth daily., Disp: 5 tablet, Rfl: 0  No Known Allergies  ROS  Ten systems reviewed and is negative except as mentioned in HPI   Objective  Vitals:   01/24/19 1343  BP: 120/80  Pulse: (!) 111  Resp: 16  Temp: 98.3 F (36.8 C)  TempSrc: Oral  SpO2: 98%  Weight: 168 lb 9.6 oz (76.5 kg)  Height: 5\' 10"  (1.778 m)    Body mass index is 24.19 kg/m.    Physical Exam  Constitutional: Patient appears well-developed and well-nourished.  No distress.  HEENT: head atraumatic, normocephalic, pupils equal and reactive to light, neck supple, history of cleft palate surgery  Cardiovascular: Normal rate, regular rhythm and normal heart sounds.  No murmur heard. No BLE edema. Pulmonary/Chest: Effort normal and breath sounds normal. No respiratory distress. Abdominal: Soft.  There is no tenderness. Negative CVA, normal bowel sounds  Psychiatric: Patient has a  normal mood and affect. behavior is normal. Judgment and thought content normal.  Recent Results (from the past 2160 hour(s))  COMPLETE METABOLIC PANEL WITH GFR     Status: Abnormal   Collection Time: 01/22/19 12:15 PM  Result Value Ref Range   Glucose, Bld 120 (H) 65 - 99 mg/dL    Comment: .            Fasting reference interval . For someone without known diabetes, a glucose value between 100 and 125 mg/dL is consistent with prediabetes and should be confirmed with a follow-up test. .    BUN 13 7 - 25 mg/dL   Creat 0.95 0.60 - 1.35 mg/dL   GFR, Est Non African American 94 > OR = 60 mL/min/1.38m2   GFR, Est African  American 109 > OR = 60 mL/min/1.4m2   BUN/Creatinine Ratio NOT APPLICABLE 6 - 22 (calc)   Sodium 131 (L) 135 - 146 mmol/L   Potassium 3.7 3.5 - 5.3 mmol/L   Chloride 93 (L) 98 - 110 mmol/L   CO2 24 20 - 32 mmol/L   Calcium 9.7 8.6 - 10.3 mg/dL   Total Protein 7.1 6.1 - 8.1 g/dL   Albumin 4.1 3.6 - 5.1 g/dL   Globulin 3.0 1.9 - 3.7 g/dL (calc)   AG Ratio 1.4 1.0 - 2.5 (calc)   Total Bilirubin 0.6 0.2 - 1.2 mg/dL   Alkaline phosphatase (APISO) 142 (H) 36 - 130 U/L   AST 15 10 - 40 U/L   ALT 12 9 - 46 U/L  CBC with Differential/Platelet     Status: Abnormal   Collection Time: 01/22/19 12:15 PM  Result Value Ref Range   WBC 17.3 (H) 3.8 - 10.8 Thousand/uL   RBC 4.38 4.20 - 5.80 Million/uL   Hemoglobin 12.8 (L) 13.2 - 17.1 g/dL   HCT 37.2 (L) 38.5 - 50.0 %   MCV 84.9 80.0 - 100.0 fL   MCH 29.2 27.0 - 33.0 pg   MCHC 34.4 32.0 - 36.0 g/dL   RDW 12.4 11.0 - 15.0 %   Platelets 200 140 - 400 Thousand/uL   MPV 12.8 (H) 7.5 - 12.5 fL   Neutro Abs 14,134 (H) 1,500 - 7,800 cells/uL   Lymphs Abs 1,592 850 - 3,900 cells/uL   Absolute Monocytes 1,436 (H) 200 - 950 cells/uL   Eosinophils Absolute 87 15 - 500 cells/uL   Basophils Absolute 52 0 - 200 cells/uL   Neutrophils Relative % 81.7 %   Total Lymphocyte 9.2 %   Monocytes Relative 8.3 %   Eosinophils Relative 0.5 %   Basophils Relative 0.3 %  CULTURE, URINE COMPREHENSIVE     Status: Abnormal (Preliminary result)   Collection Time: 01/22/19 12:15 PM  Result Value Ref Range   MICRO NUMBER: [id number]    SPECIMEN QUALITY: Adequate    Source URINE, SUPRAPUBIC    STATUS: PRELIMINARY    ISOLATE 1: Gram positive cocci isolated (A)     Comment: Greater than 100,000 CFU/mL of Gram positive cocci isolated Identification and susceptibilities to follow.  Basic metabolic panel     Status: Abnormal   Collection Time: 01/24/19 11:20 AM  Result Value Ref Range   Sodium 132 (L) 135 - 145 mmol/L   Potassium 3.2 (L) 3.5 - 5.1 mmol/L   Chloride 94  (L) 98 - 111 mmol/L   CO2 27 22 - 32 mmol/L   Glucose, Bld 93 70 - 99 mg/dL   BUN 9 6 - 20 mg/dL  Creatinine, Ser 0.77 0.61 - 1.24 mg/dL   Calcium 9.4 8.9 - 10.3 mg/dL   GFR calc non Af Amer >60 >60 mL/min   GFR calc Af Amer >60 >60 mL/min   Anion gap 11 5 - 15    Comment: Performed at [facility], [address] [place], [place] [address]  CBC with Differential/Platelet     Status: Abnormal   Collection Time: 01/24/19 11:20 AM  Result Value Ref Range   WBC 13.5 (H) 4.0 - 10.5 K/uL   RBC 4.19 (L) 4.22 - 5.81 MIL/uL   Hemoglobin 12.5 (L) 13.0 - 17.0 g/dL   HCT 35.9 (L) 39.0 - 52.0 %   MCV 85.7 80.0 - 100.0 fL   MCH 29.8 26.0 - 34.0 pg   MCHC 34.8 30.0 - 36.0 g/dL   RDW 12.0 11.5 - 15.5 %   Platelets 253 150 - 400 K/uL   nRBC 0.0 0.0 - 0.2 %   Neutrophils Relative % 72 %   Neutro Abs 9.7 (H) 1.7 - 7.7 K/uL   Lymphocytes Relative 16 %   Lymphs Abs 2.2 0.7 - 4.0 K/uL   Monocytes Relative 9 %   Monocytes Absolute 1.2 (H) 0.1 - 1.0 K/uL   Eosinophils Relative 2 %   Eosinophils Absolute 0.2 0.0 - 0.5 K/uL   Basophils Relative 0 %   Basophils Absolute 0.1 0.0 - 0.1 K/uL   Immature Granulocytes 1 %   Abs Immature Granulocytes 0.07 0.00 - 0.07 K/uL    Comment: Performed at [facility], [address] [place], [place] [address]  Sedimentation rate     Status: Abnormal   Collection Time: 01/24/19 11:20 AM  Result Value Ref Range   Sed Rate 87 (H) 0 - 15 mm/hr    Comment: Performed at [facility], [address] [place], [place] [address]     Assessment & Plan  1. Urinary tract infection without hematuria, site unspecified  - CBC with Differential/Platelet - BASIC METABOLIC PANEL WITH GFR - potassium chloride SA (K-DUR,KLOR-CON) 20 MEQ tablet; Take 1 tablet (20 mEq total) by mouth daily.  Dispense: 5 tablet; Refill: 0 - doxycycline (VIBRA-TABS) 100 MG tablet; Take 1 tablet (100 mg total) by mouth 2 (two) times daily.  Dispense: 14 tablet;  Refill: 0  2. Leukocytosis, unspecified type  - CBC with Differential/Platelet  3. Hyponatremia  - BASIC METABOLIC PANEL WITH GFR  4. Hypokalemia  - BASIC METABOLIC PANEL WITH GFR - potassium chloride SA (K-DUR,KLOR-CON) 20 MEQ tablet; Take 1 tablet (20 mEq total) by mouth daily.  Dispense: 5 tablet; Refill: 0

## 2019-01-25 DIAGNOSIS — E871 Hypo-osmolality and hyponatremia: Secondary | ICD-10-CM | POA: Diagnosis not present

## 2019-01-25 DIAGNOSIS — N390 Urinary tract infection, site not specified: Secondary | ICD-10-CM | POA: Diagnosis not present

## 2019-01-25 DIAGNOSIS — E876 Hypokalemia: Secondary | ICD-10-CM | POA: Diagnosis not present

## 2019-01-25 DIAGNOSIS — D72829 Elevated white blood cell count, unspecified: Secondary | ICD-10-CM | POA: Diagnosis not present

## 2019-01-25 LAB — CBC WITH DIFFERENTIAL/PLATELET
Absolute Monocytes: 965 cells/uL — ABNORMAL HIGH (ref 200–950)
Basophils Absolute: 74 cells/uL (ref 0–200)
Basophils Relative: 0.7 %
Eosinophils Absolute: 318 cells/uL (ref 15–500)
Eosinophils Relative: 3 %
HCT: 33.5 % — ABNORMAL LOW (ref 38.5–50.0)
HEMOGLOBIN: 11.6 g/dL — AB (ref 13.2–17.1)
Lymphs Abs: 1982 cells/uL (ref 850–3900)
MCH: 30.1 pg (ref 27.0–33.0)
MCHC: 34.6 g/dL (ref 32.0–36.0)
MCV: 86.8 fL (ref 80.0–100.0)
MPV: 12.2 fL (ref 7.5–12.5)
Monocytes Relative: 9.1 %
Neutro Abs: 7261 cells/uL (ref 1500–7800)
Neutrophils Relative %: 68.5 %
Platelets: 268 10*3/uL (ref 140–400)
RBC: 3.86 10*6/uL — ABNORMAL LOW (ref 4.20–5.80)
RDW: 12.4 % (ref 11.0–15.0)
Total Lymphocyte: 18.7 %
WBC: 10.6 10*3/uL (ref 3.8–10.8)

## 2019-01-25 LAB — BASIC METABOLIC PANEL WITH GFR
BUN: 9 mg/dL (ref 7–25)
CO2: 29 mmol/L (ref 20–32)
CREATININE: 0.81 mg/dL (ref 0.60–1.35)
Calcium: 9.3 mg/dL (ref 8.6–10.3)
Chloride: 96 mmol/L — ABNORMAL LOW (ref 98–110)
GFR, Est African American: 122 mL/min/{1.73_m2} (ref >=60)
GFR, Est Non African American: 105 mL/min/{1.73_m2} (ref >=60)
Glucose, Bld: 106 mg/dL — ABNORMAL HIGH (ref 65–99)
Potassium: 3.5 mmol/L (ref 3.5–5.3)
Sodium: 134 mmol/L — ABNORMAL LOW (ref 135–146)

## 2019-01-29 ENCOUNTER — Ambulatory Visit: Payer: Self-pay | Admitting: Family Medicine

## 2019-01-29 ENCOUNTER — Encounter: Payer: Self-pay | Admitting: Family Medicine

## 2019-01-29 ENCOUNTER — Other Ambulatory Visit: Payer: Self-pay

## 2019-01-29 ENCOUNTER — Ambulatory Visit (INDEPENDENT_AMBULATORY_CARE_PROVIDER_SITE_OTHER): Payer: Medicare Other | Admitting: Family Medicine

## 2019-01-29 VITALS — BP 124/76 | HR 88 | Temp 98.3°F | Resp 16 | Ht 70.0 in | Wt 170.2 lb

## 2019-01-29 DIAGNOSIS — N390 Urinary tract infection, site not specified: Secondary | ICD-10-CM | POA: Diagnosis not present

## 2019-01-29 DIAGNOSIS — D72829 Elevated white blood cell count, unspecified: Secondary | ICD-10-CM

## 2019-01-29 NOTE — Progress Notes (Signed)
Name: Alec Campbell   MRN: [id number]    DOB: 1971-09-06   Date:01/29/2019       Progress Note  Subjective  Chief Complaint  Chief Complaint  Patient presents with  . Follow-up  . Urinary Tract Infection    Mother states his appetite is back and feeling much better    HPI  Leucocytosis and UTI: he is back to his normal self, normal appetite, no fatigue, no abdominal pain, still taking antibiotics No fever or chills. Denies blood in stools or urine. Gained 2 lbs since last visit   Patient Active Problem List   Diagnosis Date Noted  . Cold sore 02/13/2016  . Vitamin D deficiency 07/20/2015  . Intellectual disability with epilepsy ([place]) 07/16/2015  . Seizure disorder ([place]) 07/16/2015  . Seasonal allergic rhinitis 07/16/2015  . Dyslipidemia 07/16/2015  . Hyponatremia 06/13/2015  . Abnormal blood sugar 06/19/2008    Social History   Tobacco Use  . Smoking status: Never Smoker  . Smokeless tobacco: Never Used  . Tobacco comment: smoking cessation materials not required  Substance Use Topics  . Alcohol use: No    Alcohol/week: 0.0 standard drinks     Current Outpatient Medications:  .  carbamazepine (EPITOL) 200 MG tablet, TAKE 1 TABLET BY MOUTH 4 TIMES DAILY, Disp: 120 tablet, Rfl: 5 .  doxycycline (VIBRA-TABS) 100 MG tablet, Take 1 tablet (100 mg total) by mouth 2 (two) times daily., Disp: 14 tablet, Rfl: 0 .  loratadine (CLARITIN) 10 MG tablet, Take 1 tablet (10 mg total) by mouth daily., Disp: 30 tablet, Rfl: 5 .  PHENobarbital (LUMINAL) 64.8 MG tablet, Take 1 tablet (64.8 mg total) by mouth 2 (two) times daily., Disp: 60 tablet, Rfl: 5 .  potassium chloride SA (K-DUR,KLOR-CON) 20 MEQ tablet, Take 1 tablet (20 mEq total) by mouth daily., Disp: 5 tablet, Rfl: 0 .  PREVIDENT 5000 PLUS 1.1 % CREA dental cream, Place 1 application onto teeth 2 (two) times daily., Disp: 1 Tube, Rfl: 5 .  valACYclovir (VALTREX) 1000 MG tablet, TAKE 1 TABLET BY MOUTH TWICE DAILY AS NEEDED FOR   OUTBREAK, Disp: 30 tablet, Rfl: 0  No Known Allergies  ROS  Ten systems reviewed and is negative except as mentioned in HPI   Objective  Vitals:   01/29/19 1112  BP: 124/76  Pulse: 88  Resp: 16  Temp: 98.3 F (36.8 C)  TempSrc: Oral  SpO2: 97%  Weight: 170 lb 3.2 oz (77.2 kg)  Height: 5\' 10"  (1.778 m)    Body mass index is 24.42 kg/m.    Physical Exam  Constitutional: Patient appears well-developed and well-nourished.  No distress.  Cardiovascular: Normal rate, regular rhythm and normal heart sounds.  No murmur heard. No BLE edema. Pulmonary/Chest: Effort normal and breath sounds normal. No respiratory distress. Abdominal: Soft.  There is no tenderness, no distention, normal bowel sounds  Psychiatric: Patient has a normal mood and affect. behavior is normal.   Recent Results (from the past 2160 hour(s))  COMPLETE METABOLIC PANEL WITH GFR     Status: Abnormal   Collection Time: 01/22/19 12:15 PM  Result Value Ref Range   Glucose, Bld 120 (H) 65 - 99 mg/dL    Comment: .            Fasting reference interval . For someone without known diabetes, a glucose value between 100 and 125 mg/dL is consistent with prediabetes and should be confirmed with a follow-up test. .    BUN 13 7 -  25 mg/dL   Creat 0.95 0.60 - 1.35 mg/dL   GFR, Est Non African American 94 > OR = 60 mL/min/1.37m2   GFR, Est African American 109 > OR = 60 mL/min/1.8m2   BUN/Creatinine Ratio NOT APPLICABLE 6 - 22 (calc)   Sodium 131 (L) 135 - 146 mmol/L   Potassium 3.7 3.5 - 5.3 mmol/L   Chloride 93 (L) 98 - 110 mmol/L   CO2 24 20 - 32 mmol/L   Calcium 9.7 8.6 - 10.3 mg/dL   Total Protein 7.1 6.1 - 8.1 g/dL   Albumin 4.1 3.6 - 5.1 g/dL   Globulin 3.0 1.9 - 3.7 g/dL (calc)   AG Ratio 1.4 1.0 - 2.5 (calc)   Total Bilirubin 0.6 0.2 - 1.2 mg/dL   Alkaline phosphatase (APISO) 142 (H) 36 - 130 U/L   AST 15 10 - 40 U/L   ALT 12 9 - 46 U/L  CBC with Differential/Platelet     Status: Abnormal    Collection Time: 01/22/19 12:15 PM  Result Value Ref Range   WBC 17.3 (H) 3.8 - 10.8 Thousand/uL   RBC 4.38 4.20 - 5.80 Million/uL   Hemoglobin 12.8 (L) 13.2 - 17.1 g/dL   HCT 37.2 (L) 38.5 - 50.0 %   MCV 84.9 80.0 - 100.0 fL   MCH 29.2 27.0 - 33.0 pg   MCHC 34.4 32.0 - 36.0 g/dL   RDW 12.4 11.0 - 15.0 %   Platelets 200 140 - 400 Thousand/uL   MPV 12.8 (H) 7.5 - 12.5 fL   Neutro Abs 14,134 (H) 1,500 - 7,800 cells/uL   Lymphs Abs 1,592 850 - 3,900 cells/uL   Absolute Monocytes 1,436 (H) 200 - 950 cells/uL   Eosinophils Absolute 87 15 - 500 cells/uL   Basophils Absolute 52 0 - 200 cells/uL   Neutrophils Relative % 81.7 %   Total Lymphocyte 9.2 %   Monocytes Relative 8.3 %   Eosinophils Relative 0.5 %   Basophils Relative 0.3 %  CULTURE, URINE COMPREHENSIVE     Status: Abnormal   Collection Time: 01/22/19 12:15 PM  Result Value Ref Range   MICRO NUMBER: [id number]    SPECIMEN QUALITY: Adequate    Source URINE, SUPRAPUBIC    STATUS: FINAL    ISOLATE 1: Enterococcus faecalis (A)     Comment: Greater than 100,000 CFU/mL of Enterococcus faecalis      Susceptibility   Enterococcus faecalis - CULT, URN, SPECIAL POSITIVE 1    AMPICILLIN <=2 Sensitive     VANCOMYCIN 1 Sensitive     NITROFURANTOIN* <=16 Sensitive      * Legend:S = Susceptible  I = IntermediateR = Resistant  NS = Not susceptible* = Not tested  NR = Not reported**NN = See antimicrobic comments  Basic metabolic panel     Status: Abnormal   Collection Time: 01/24/19 11:20 AM  Result Value Ref Range   Sodium 132 (L) 135 - 145 mmol/L   Potassium 3.2 (L) 3.5 - 5.1 mmol/L   Chloride 94 (L) 98 - 111 mmol/L   CO2 27 22 - 32 mmol/L   Glucose, Bld 93 70 - 99 mg/dL   BUN 9 6 - 20 mg/dL   Creatinine, Ser 0.77 0.61 - 1.24 mg/dL   Calcium 9.4 8.9 - 10.3 mg/dL   GFR calc non Af Amer >60 >60 mL/min   GFR calc Af Amer >60 >60 mL/min   Anion gap 11 5 - 15    Comment: Performed at [facility], [address]  [place], [place] [address]  CBC with Differential/Platelet     Status: Abnormal   Collection Time: 01/24/19 11:20 AM  Result Value Ref Range   WBC 13.5 (H) 4.0 - 10.5 K/uL   RBC 4.19 (L) 4.22 - 5.81 MIL/uL   Hemoglobin 12.5 (L) 13.0 - 17.0 g/dL   HCT 35.9 (L) 39.0 - 52.0 %   MCV 85.7 80.0 - 100.0 fL   MCH 29.8 26.0 - 34.0 pg   MCHC 34.8 30.0 - 36.0 g/dL   RDW 12.0 11.5 - 15.5 %   Platelets 253 150 - 400 K/uL   nRBC 0.0 0.0 - 0.2 %   Neutrophils Relative % 72 %   Neutro Abs 9.7 (H) 1.7 - 7.7 K/uL   Lymphocytes Relative 16 %   Lymphs Abs 2.2 0.7 - 4.0 K/uL   Monocytes Relative 9 %   Monocytes Absolute 1.2 (H) 0.1 - 1.0 K/uL   Eosinophils Relative 2 %   Eosinophils Absolute 0.2 0.0 - 0.5 K/uL   Basophils Relative 0 %   Basophils Absolute 0.1 0.0 - 0.1 K/uL   Immature Granulocytes 1 %   Abs Immature Granulocytes 0.07 0.00 - 0.07 K/uL    Comment: Performed at [facility], [place], [place] [address]  C-reactive protein     Status: Abnormal   Collection Time: 01/24/19 11:20 AM  Result Value Ref Range   CRP 17.4 (H) <1.0 mg/dL    Comment: Performed at [facility], [address] [place], [place] [address]  Sedimentation rate     Status: Abnormal   Collection Time: 01/24/19 11:20 AM  Result Value Ref Range   Sed Rate 87 (H) 0 - 15 mm/hr    Comment: Performed at [facility], [place],  [address]  CBC with Differential/Platelet     Status: Abnormal   Collection Time: 01/25/19 11:07 AM  Result Value Ref Range   WBC 10.6 3.8 - 10.8 Thousand/uL   RBC 3.86 (L) 4.20 - 5.80 Million/uL   Hemoglobin 11.6 (L) 13.2 - 17.1 g/dL   HCT 33.5 (L) 38.5 - 50.0 %   MCV 86.8 80.0 - 100.0 fL   MCH 30.1 27.0 - 33.0 pg   MCHC 34.6 32.0 - 36.0 g/dL   RDW 12.4 11.0 - 15.0 %   Platelets 268 140 - 400 Thousand/uL   MPV 12.2 7.5 - 12.5 fL   Neutro Abs 7,261 1,500 - 7,800 cells/uL   Lymphs Abs 1,982 850 - 3,900 cells/uL   Absolute Monocytes  965 (H) 200 - 950 cells/uL   Eosinophils Absolute 318 15 - 500 cells/uL   Basophils Absolute 74 0 - 200 cells/uL   Neutrophils Relative % 68.5 %   Total Lymphocyte 18.7 %   Monocytes Relative 9.1 %   Eosinophils Relative 3.0 %   Basophils Relative 0.7 %  BASIC METABOLIC PANEL WITH GFR     Status: Abnormal   Collection Time: 01/25/19 11:07 AM  Result Value Ref Range   Glucose, Bld 106 (H) 65 - 99 mg/dL    Comment: .            Fasting reference interval . For someone without known diabetes, a glucose value between 100 and 125 mg/dL is consistent with prediabetes and should be confirmed with a follow-up test. .    BUN 9 7 - 25 mg/dL   Creat 0.81 0.60 - 1.35 mg/dL  GFR, Est Non African American 105 > OR = 60 mL/min/1.28m2   GFR, Est African American 122 > OR = 60 mL/min/1.57m2   BUN/Creatinine Ratio NOT APPLICABLE 6 - 22 (calc)   Sodium 134 (L) 135 - 146 mmol/L   Potassium 3.5 3.5 - 5.3 mmol/L   Chloride 96 (L) 98 - 110 mmol/L   CO2 29 20 - 32 mmol/L   Calcium 9.3 8.6 - 10.3 mg/dL     Assessment & Plan  1. Urinary tract infection without hematuria, site unspecified  - CBC with Differential/Platelet - CULTURE, URINE COMPREHENSIVE  2. Leukocytosis, unspecified type  - CBC with Differential/Platelet - CULTURE, URINE COMPREHENSIVE

## 2019-01-31 LAB — CBC WITH DIFFERENTIAL/PLATELET
ABSOLUTE MONOCYTES: 619 {cells}/uL (ref 200–950)
Basophils Absolute: 103 cells/uL (ref 0–200)
Basophils Relative: 1.2 %
Eosinophils Absolute: 628 cells/uL — ABNORMAL HIGH (ref 15–500)
Eosinophils Relative: 7.3 %
HEMATOCRIT: 33.7 % — AB (ref 38.5–50.0)
Hemoglobin: 11.4 g/dL — ABNORMAL LOW (ref 13.2–17.1)
LYMPHS ABS: 2098 {cells}/uL (ref 850–3900)
MCH: 29 pg (ref 27.0–33.0)
MCHC: 33.8 g/dL (ref 32.0–36.0)
MCV: 85.8 fL (ref 80.0–100.0)
MPV: 11.6 fL (ref 7.5–12.5)
Monocytes Relative: 7.2 %
NEUTROS ABS: 5151 {cells}/uL (ref 1500–7800)
Neutrophils Relative %: 59.9 %
Platelets: 331 10*3/uL (ref 140–400)
RBC: 3.93 10*6/uL — ABNORMAL LOW (ref 4.20–5.80)
RDW: 12.4 % (ref 11.0–15.0)
Total Lymphocyte: 24.4 %
WBC: 8.6 10*3/uL (ref 3.8–10.8)

## 2019-01-31 LAB — CULTURE, URINE COMPREHENSIVE
MICRO NUMBER:: 364611
RESULT:: NO GROWTH
SPECIMEN QUALITY:: ADEQUATE

## 2019-03-01 ENCOUNTER — Ambulatory Visit: Payer: Self-pay | Admitting: Family Medicine

## 2019-03-22 ENCOUNTER — Other Ambulatory Visit: Payer: Self-pay | Admitting: Family Medicine

## 2019-04-04 ENCOUNTER — Other Ambulatory Visit: Payer: Self-pay | Admitting: Family Medicine

## 2019-04-04 DIAGNOSIS — G40909 Epilepsy, unspecified, not intractable, without status epilepticus: Secondary | ICD-10-CM

## 2019-04-05 ENCOUNTER — Ambulatory Visit (INDEPENDENT_AMBULATORY_CARE_PROVIDER_SITE_OTHER): Payer: Medicare Other

## 2019-04-05 VITALS — Ht 70.0 in | Wt 169.0 lb

## 2019-04-05 DIAGNOSIS — Z0000 Encounter for general adult medical examination without abnormal findings: Secondary | ICD-10-CM

## 2019-04-05 NOTE — Progress Notes (Signed)
Subjective:   Alec Campbell is a 48 y.o. male who presents for Medicare Annual/Subsequent preventive examination.  Virtual Visit via Telephone Note  I connected with Alec Campbell on 04/05/19 at 11:40 AM EDT by telephone and verified that I am speaking with the correct person using two identifiers.  Medicare Annual Wellness visit completed telephonically due to Covid-19 pandemic.   Location: Patient: home Provider: office   I discussed the limitations, risks, security and privacy concerns of performing an evaluation and management service by telephone and the availability of in person appointments. The patient expressed understanding and agreed to proceed.  Some vital signs may be absent or patient reported.   Alec Marker, LPN  Review of Systems:  Cardiac Risk Factors include: (P) male gender     Objective:    Vitals: Ht 5\' 10"  (1.778 m)   Wt 169 lb (76.7 kg)   BMI 24.25 kg/m   Body mass index is 24.25 kg/m.  Advanced Directives 04/05/2019 02/28/2018 08/16/2017 04/21/2017 01/19/2017 07/21/2016 01/19/2016  Does Patient Have a Medical Advance Directive? No No No No No No No  Would patient like information on creating a medical advance directive? No - Guardian declined Yes (MAU/Ambulatory/Procedural Areas - Information given) - - - No - patient declined information -    Tobacco Social History   Tobacco Use  Smoking Status Never Smoker  Smokeless Tobacco Never Used  Tobacco Comment   smoking cessation materials not required     Counseling given: Not Answered Comment: smoking cessation materials not required   Clinical Intake:  Pre-visit preparation completed: Yes  Pain : No/denies pain     BMI - recorded: 24.25 Nutritional Status: BMI of 19-24  Normal Nutritional Risks: None Diabetes: No  How often do you need to have someone help you when you read instructions, pamphlets, or other written materials from your doctor or pharmacy?: 5 - Always  Interpreter  Needed?: No  Information entered by :: Alec Marker LPN  Past Medical History:  Diagnosis Date  . Allergy   . Moderate mental retardation   . Seizures ([place])   . Vitamin D deficiency    Past Surgical History:  Procedure Laterality Date  . ANKLE SURGERY Right   . CLEFT LIP REPAIR    . HERNIA REPAIR     Inguinal  . TYMPANOSTOMY TUBE PLACEMENT     Family History  Problem Relation Age of Onset  . Hypertension Mother   . Thyroid disease Mother   . CAD Father   . Hypertension Father   . Hyperlipidemia Father   . Healthy Brother   . Healthy Sister   . Diabetes Brother   . COPD Brother   . Emphysema Brother   . Alcohol abuse Brother   . Hypertension Brother   . Aneurysm Brother        brain   Social History   Socioeconomic History  . Marital status: Single    Spouse name: Not on file  . Number of children: 0  . Years of education: Handicapped diploma  . Highest education level: Not on file  Occupational History    Employer: DISABLED  Social Needs  . Financial resource strain: Not hard at all  . Food insecurity:    Worry: Never true    Inability: Never true  . Transportation needs:    Medical: No    Non-medical: No  Tobacco Use  . Smoking status: Never Smoker  . Smokeless tobacco: Never Used  . Tobacco comment:  smoking cessation materials not required  Substance and Sexual Activity  . Alcohol use: No    Alcohol/week: 0.0 standard drinks  . Drug use: No  . Sexual activity: Never  Lifestyle  . Physical activity:    Days per week: 7 days    Minutes per session: 20 min  . Stress: Not at all  Relationships  . Social connections:    Talks on phone: Once a week    Gets together: More than three times a week    Attends religious service: Never    Active member of club or organization: No    Attends meetings of clubs or organizations: Never    Relationship status: Never married  Other Topics Concern  . Not on file  Social History Narrative   Moderate mental  retardation. Parents are the primary caregiver, he lives with them    Outpatient Encounter Medications as of 04/05/2019  Medication Sig  . carbamazepine (EPITOL) 200 MG tablet TAKE 1 TABLET BY MOUTH 4 TIMES DAILY  . loratadine (CLARITIN) 10 MG tablet Take 1 tablet (10 mg total) by mouth daily.  . Multiple Vitamin (MULTIVITAMIN) tablet Take 1 tablet by mouth daily.  Marland Kitchen PHENobarbital (LUMINAL) 64.8 MG tablet Take 1 tablet (64.8 mg total) by mouth 2 (two) times daily.  . SF 5000 PLUS 1.1 % CREA dental cream BRUSH ON TEETH TWICE DAILY. NO EATING OR DRINKING FOR 30 MINUTES AFTER USE  . valACYclovir (VALTREX) 1000 MG tablet TAKE 1 TABLET BY MOUTH TWICE DAILY AS NEEDED FOR  OUTBREAK  . [DISCONTINUED] doxycycline (VIBRA-TABS) 100 MG tablet Take 1 tablet (100 mg total) by mouth 2 (two) times daily.  . [DISCONTINUED] potassium chloride SA (K-DUR,KLOR-CON) 20 MEQ tablet Take 1 tablet (20 mEq total) by mouth daily.   No facility-administered encounter medications on file as of 04/05/2019.     Activities of Daily Living In your present state of health, do you have any difficulty performing the following activities: 04/05/2019 01/29/2019  Hearing? N N  Comment declines hearing aids -  Vision? N N  Difficulty concentrating or making decisions? Alec Campbell  Walking or climbing stairs? N N  Dressing or bathing? N N  Doing errands, shopping? Alec Campbell  Preparing Food and eating ? Y -  Using the Toilet? N -  In the past six months, have you accidently leaked urine? N -  Do you have problems with loss of bowel control? N -  Managing your Medications? Y -  Managing your Finances? Y -  Housekeeping or managing your Housekeeping? Y -  Some recent data might be hidden    Patient Care Team: [name], MD as PCP - General (Family Medicine)   Assessment:   This is a routine wellness examination for [place].  Exercise Activities and Dietary recommendations Current Exercise Habits: (P) Home exercise routine, Type of  exercise: (P) walking, Time (Minutes): (P) 20, Frequency (Times/Week): (P) 7, Weekly Exercise (Minutes/Week): (P) 140, Intensity: (P) Mild, Exercise limited by: (P) None identified  Goals    . DIET - INCREASE WATER INTAKE     Recommend to drink at least 6-8 8oz glasses of water per day.       Fall Risk Fall Risk  04/05/2019 01/29/2019 01/24/2019 09/06/2018 02/28/2018  Falls in the past year? 0 0 0 0 Yes  Number falls in past yr: 0 0 0 0 1  Injury with Fall? 0 0 0 0 No  Risk for fall due to : - - - -  Other (Comment)  Risk for fall due to: Comment - - - - Moderate mental retardation  Follow up Falls prevention discussed - - - Falls evaluation completed;Education provided;Falls prevention discussed   FALL RISK PREVENTION PERTAINING TO THE HOME:  Any stairs in or around the home? Yes  If so, do they handrails? Yes   Home free of loose throw rugs in walkways, pet beds, electrical cords, etc? Yes  Adequate lighting in your home to reduce risk of falls? Yes   ASSISTIVE DEVICES UTILIZED TO PREVENT FALLS:  Life alert? No  Use of a cane, walker or w/c? No  Grab bars in the bathroom? No  Shower chair or bench in shower? No  Elevated toilet seat or a handicapped toilet? No   DME ORDERS:  DME order needed?  No   TIMED UP AND GO:  Was the test performed? No . Telephonic visit  Education: Fall risk prevention has been discussed.  Intervention(s) required? No   Depression Screen PHQ 2/9 Scores 04/05/2019 01/22/2019 02/28/2018 09/27/2017  PHQ - 2 Score - - - 0  Exception Documentation Medical reason Medical reason Medical reason -    Cognitive Function: 6CIT not performed due to moderate cognitive disability        Immunization History  Administered Date(s) Administered  . Influenza Split 10/06/2009, 07/21/2012  . Influenza, Seasonal, Injecte, Preservative Fre 06/23/2010  . Influenza,inj,Quad PF,6+ Mos 07/23/2013, 08/02/2014, 07/18/2015, 07/21/2016, 08/03/2017, 08/07/2018  .  Influenza-Unspecified 08/02/2014  . Tdap 06/23/2010    Qualifies for Shingles Vaccine? Yes  . Due for Shingrix. Education has been provided regarding the importance of this vaccine. Pt has been advised to call insurance company to determine out of pocket expense. Advised may also receive vaccine at local pharmacy or Health Dept. Verbalized acceptance and understanding.  Tdap: Up to date   Flu Vaccine: Up to date  Pneumococcal Vaccine: due age 37  Screening Tests Health Maintenance  Topic Date Due  . INFLUENZA VACCINE  06/02/2019  . TETANUS/TDAP  06/23/2020  . HIV Screening  Discontinued   Cancer Screenings:  Colorectal Screening: due age 56  Lung Cancer Screening: (Low Dose CT Chest recommended if Age 49-80 years, 30 pack-year currently smoking OR have quit w/in 15years.) does not qualify.   Additional Screening:  Hepatitis C Screening: does qualify; postponed.  Vision Screening: Recommended annual ophthalmology exams for early detection of glaucoma and other disorders of the eye. Is the patient up to date with their annual eye exam?  No  Who is the provider or what is the name of the office in which the pt attends annual eye exams? No established provider If pt is not established with a provider, would they like to be referred to a provider to establish care? No .   Dental Screening: Recommended annual dental exams for proper oral hygiene  Community Resource Referral:  CRR required this visit?  No       Plan:    I have personally reviewed and addressed the Medicare Annual Wellness questionnaire and have noted the following in the patient's chart:  A. Medical and social history B. Use of alcohol, tobacco or illicit drugs  C. Current medications and supplements D. Functional ability and status E.  Nutritional status F.  Physical activity G. Advance directives H. List of other physicians I.  Hospitalizations, surgeries, and ER visits in previous 12 months J.  [place] such as hearing and vision if needed, cognitive and depression L. Referrals and appointments  In addition, I have reviewed and discussed with patient certain preventive protocols, quality metrics, and best practice recommendations. A written personalized care plan for preventive services as well as general preventive health recommendations were provided to patient.   Signed,  Alec Marker, LPN Nurse Health Advisor   Nurse Notes: Spoke to patient's mother, Alec Campbell regarding his wellness due to cognitive state and difficulty understanding patient verbally. She reports he is doing well and encourages his independence as much as possible. 6 month follow up appt schedule next month with Dr. Ancil Boozer.

## 2019-05-21 ENCOUNTER — Other Ambulatory Visit: Payer: Self-pay

## 2019-05-21 ENCOUNTER — Encounter: Payer: Self-pay | Admitting: Family Medicine

## 2019-05-21 ENCOUNTER — Ambulatory Visit (INDEPENDENT_AMBULATORY_CARE_PROVIDER_SITE_OTHER): Payer: Medicare Other | Admitting: Family Medicine

## 2019-05-21 VITALS — BP 108/64 | HR 96 | Temp 97.1°F | Resp 16 | Ht 70.0 in | Wt 171.1 lb

## 2019-05-21 DIAGNOSIS — E876 Hypokalemia: Secondary | ICD-10-CM

## 2019-05-21 DIAGNOSIS — R700 Elevated erythrocyte sedimentation rate: Secondary | ICD-10-CM

## 2019-05-21 DIAGNOSIS — G40909 Epilepsy, unspecified, not intractable, without status epilepticus: Secondary | ICD-10-CM

## 2019-05-21 DIAGNOSIS — E559 Vitamin D deficiency, unspecified: Secondary | ICD-10-CM | POA: Diagnosis not present

## 2019-05-21 DIAGNOSIS — D72829 Elevated white blood cell count, unspecified: Secondary | ICD-10-CM | POA: Diagnosis not present

## 2019-05-21 DIAGNOSIS — D649 Anemia, unspecified: Secondary | ICD-10-CM | POA: Diagnosis not present

## 2019-05-21 DIAGNOSIS — E871 Hypo-osmolality and hyponatremia: Secondary | ICD-10-CM | POA: Diagnosis not present

## 2019-05-21 NOTE — Progress Notes (Addendum)
Name: Alec Campbell   MRN: [id number]    DOB: [birth year]   Date:05/21/2019       Progress Note  Subjective  Chief Complaint  Chief Complaint  Patient presents with  . Allergic Rhinitis   . Seizures  . Dyslipidemia    HPI  Leucocytosis: he was feeling tired, lack of appetite and white count was very high. He had elevated sed rate and CRP, took antibiotics for UTI and has been feeling well but we want to recheck labs.   Anemia unspecified: we will recheck labs, denies fatigue or SOB  Hyponatremia and hypokalemia: his mother does not cook with salt, advised gatorade zero and or propel for him , cut down on water intake, He was seen by nephrologist in the past.   Intellectual disability with epilepsy:  he lives with his parents, he needs help with medication management, transportation and he is unable to manage his finances, also needs helps when seeing a physician.He developed seizures at 60 weeks of age , he was born with cleft lips and palate. He was diagnosed with developmental delay as a toddler. No episodes in many years   Seizure disorder: no symptoms in many years, mother is terrified of stopping his medication because his symptoms were severe. He hadhyponatremia  He is now on only two pill of phenobarbitaland 4 pills of carbamazepine daily without any side effects. We will recheck labs   Patient Active Problem List   Diagnosis Date Noted  . Cold sore 02/13/2016  . Vitamin D deficiency 07/20/2015  . Intellectual disability with epilepsy ([place]) 07/16/2015  . Seizure disorder ([place]) 07/16/2015  . Seasonal allergic rhinitis 07/16/2015  . Dyslipidemia 07/16/2015  . Hyponatremia 06/13/2015  . Abnormal blood sugar 06/19/2008    Past Surgical History:  Procedure Laterality Date  . ANKLE SURGERY Right   . CLEFT LIP REPAIR    . HERNIA REPAIR     Inguinal  . TYMPANOSTOMY TUBE PLACEMENT      Family History  Problem Relation Age of Onset  . Hypertension Mother   . Thyroid  disease Mother   . CAD Father   . Hypertension Father   . Hyperlipidemia Father   . Healthy Brother   . Healthy Sister   . Diabetes Brother   . COPD Brother   . Emphysema Brother   . Alcohol abuse Brother   . Hypertension Brother   . Aneurysm Brother        brain    Social History   Socioeconomic History  . Marital status: Single    Spouse name: Not on file  . Number of children: 0  . Years of education: Handicapped diploma  . Highest education level: Not on file  Occupational History    Employer: DISABLED  Social Needs  . Financial resource strain: Not hard at all  . Food insecurity    Worry: Never true    Inability: Never true  . Transportation needs    Medical: No    Non-medical: No  Tobacco Use  . Smoking status: Never Smoker  . Smokeless tobacco: Never Used  . Tobacco comment: smoking cessation materials not required  Substance and Sexual Activity  . Alcohol use: No    Alcohol/week: 0.0 standard drinks  . Drug use: No  . Sexual activity: Never  Lifestyle  . Physical activity    Days per week: 7 days    Minutes per session: 20 min  . Stress: Not at all  Relationships  . Social  connections    Talks on phone: Once a week    Gets together: More than three times a week    Attends religious service: Never    Active member of club or organization: No    Attends meetings of clubs or organizations: Never    Relationship status: Never married  . Intimate partner violence    Fear of current or ex partner: No    Emotionally abused: No    Physically abused: No    Forced sexual activity: No  Other Topics Concern  . Not on file  Social History Narrative   Moderate mental retardation. Parents are the primary caregiver, he lives with them     Current Outpatient Medications:  .  carbamazepine (EPITOL) 200 MG tablet, TAKE 1 TABLET BY MOUTH 4 TIMES DAILY, Disp: 120 tablet, Rfl: 5 .  loratadine (CLARITIN) 10 MG tablet, Take 1 tablet (10 mg total) by mouth daily.,  Disp: 30 tablet, Rfl: 5 .  Multiple Vitamin (MULTIVITAMIN) tablet, Take 1 tablet by mouth daily., Disp: , Rfl:  .  PHENobarbital (LUMINAL) 64.8 MG tablet, Take 1 tablet by mouth twice daily, Disp: 60 tablet, Rfl: 1 .  SF 5000 PLUS 1.1 % CREA dental cream, BRUSH ON TEETH TWICE DAILY. NO EATING OR DRINKING FOR 30 MINUTES AFTER USE, Disp: 51 g, Rfl: 0 .  valACYclovir (VALTREX) 1000 MG tablet, TAKE 1 TABLET BY MOUTH TWICE DAILY AS NEEDED FOR  OUTBREAK, Disp: 30 tablet, Rfl: 0  No Known Allergies  I personally reviewed active problem list, medication list, allergies, family history, social history with the patient/caregiver today.   ROS  Constitutional: Negative for fever or weight change.  Respiratory: Negative for cough and shortness of breath.   Cardiovascular: Negative for chest pain or palpitations.  Gastrointestinal: Negative for abdominal pain, no bowel changes.  Musculoskeletal: Negative for gait problem or joint swelling.  Skin: Negative for rash.  Neurological: Negative for dizziness or headache.  No other specific complaints in a complete review of systems (except as listed in HPI above).   Objective  Vitals:   05/21/19 1334 05/21/19 1405  BP: (!) 130/96 108/64  Pulse: 96   Resp: 16   Temp: (!) 97.1 F (36.2 C)   TempSrc: Oral   SpO2: 98%   Weight: 171 lb 1.6 oz (77.6 kg)   Height: 5\' 10"  (1.778 m)     Body mass index is 24.55 kg/m.  Physical Exam  Constitutional: Patient appears well-developed and well-nourished.  No distress.  HEENT: head atraumatic, normocephalic, pupils equal and reactive to light, neck supple, oral exam not done  Cardiovascular: Normal rate, regular rhythm and normal heart sounds.  No murmur heard. No BLE edema. Pulmonary/Chest: Effort normal and breath sounds normal. No respiratory distress. Abdominal: Soft.  There is no tenderness. Psychiatric: Patient has a normal mood and affect. behavior is normal.   PHQ2/9: Depression screen [facility] 2/9  02/28/2018 09/27/2017 09/06/2017 08/16/2017 04/21/2017  Decreased Interest - 0 0 0 0  Down, Depressed, Hopeless - 0 0 0 0  PHQ - 2 Score - 0 0 0 0  Difficult doing work/chores Not difficult at all - - - -    phq 9 is unreliable, he has intellectual disability    Fall Risk: Fall Risk  05/21/2019 04/05/2019 01/29/2019 01/24/2019 09/06/2018  Falls in the past year? 0 0 0 0 0  Number falls in past yr: 0 0 0 0 0  Injury with Fall? 0 0 0 0 0  Risk for fall due to : - - - - -  Risk for fall due to: Comment - - - - -  Follow up - Falls prevention discussed - - -     Functional Status Survey: Is the patient deaf or have difficulty hearing?: No Does the patient have difficulty seeing, even when wearing glasses/contacts?: No Does the patient have difficulty concentrating, remembering, or making decisions?: Yes Does the patient have difficulty walking or climbing stairs?: No Does the patient have difficulty dressing or bathing?: No Does the patient have difficulty doing errands alone such as visiting a doctor's office or shopping?: Yes    Assessment & Plan  1. Leukocytosis, unspecified type  - CBC with Differential/Platelet  2. Elevated sed rate  - Sedimentation rate - C-reactive protein  3. Hyponatremia  - COMPLETE METABOLIC PANEL WITH GFR  4. Hypokalemia  - COMPLETE METABOLIC PANEL WITH GFR  5. Seizure disorder (HCC)  - Carbamazepine Level (Tegretol), total - Phenobarbital level  6. Anemia, unspecified type  - Iron, TIBC and Ferritin Panel  7. Vitamin D deficiency  - VITAMIN D 25 Hydroxy (Vit-D Deficiency, Fractures)

## 2019-05-23 LAB — CBC WITH DIFFERENTIAL/PLATELET
Absolute Monocytes: 585 cells/uL (ref 200–950)
Basophils Absolute: 84 cells/uL (ref 0–200)
Basophils Relative: 1.1 %
Eosinophils Absolute: 410 cells/uL (ref 15–500)
Eosinophils Relative: 5.4 %
HCT: 37.4 % — ABNORMAL LOW (ref 38.5–50.0)
Hemoglobin: 12.9 g/dL — ABNORMAL LOW (ref 13.2–17.1)
Lymphs Abs: 2166 cells/uL (ref 850–3900)
MCH: 30.3 pg (ref 27.0–33.0)
MCHC: 34.5 g/dL (ref 32.0–36.0)
MCV: 87.8 fL (ref 80.0–100.0)
MPV: 12.5 fL (ref 7.5–12.5)
Monocytes Relative: 7.7 %
Neutro Abs: 4355 cells/uL (ref 1500–7800)
Neutrophils Relative %: 57.3 %
Platelets: 196 10*3/uL (ref 140–400)
RBC: 4.26 10*6/uL (ref 4.20–5.80)
RDW: 12.7 % (ref 11.0–15.0)
Total Lymphocyte: 28.5 %
WBC: 7.6 10*3/uL (ref 3.8–10.8)

## 2019-05-23 LAB — PHENOBARBITAL LEVEL: Phenobarbital, Serum: 25.2 mg/L (ref 15.0–40.0)

## 2019-05-23 LAB — COMPLETE METABOLIC PANEL WITH GFR
AG Ratio: 1.8 (calc) (ref 1.0–2.5)
ALT: 10 U/L (ref 9–46)
AST: 13 U/L (ref 10–40)
Albumin: 4.2 g/dL (ref 3.6–5.1)
Alkaline phosphatase (APISO): 110 U/L (ref 36–130)
BUN: 13 mg/dL (ref 7–25)
CO2: 22 mmol/L (ref 20–32)
Calcium: 9.6 mg/dL (ref 8.6–10.3)
Chloride: 102 mmol/L (ref 98–110)
Creat: 0.93 mg/dL (ref 0.60–1.35)
GFR, Est African American: 111 mL/min/{1.73_m2} (ref >=60)
GFR, Est Non African American: 96 mL/min/{1.73_m2} (ref >=60)
Globulin: 2.4 g/dL (calc) (ref 1.9–3.7)
Glucose, Bld: 119 mg/dL — ABNORMAL HIGH (ref 65–99)
Potassium: 4 mmol/L (ref 3.5–5.3)
Sodium: 132 mmol/L — ABNORMAL LOW (ref 135–146)
Total Bilirubin: 0.2 mg/dL (ref 0.2–1.2)
Total Protein: 6.6 g/dL (ref 6.1–8.1)

## 2019-05-23 LAB — IRON,TIBC AND FERRITIN PANEL
%SAT: 23 % (calc) (ref 20–48)
Ferritin: 28 ng/mL — ABNORMAL LOW (ref 38–380)
Iron: 70 ug/dL (ref 50–180)
TIBC: 311 mcg/dL (calc) (ref 250–425)

## 2019-05-23 LAB — SEDIMENTATION RATE: Sed Rate: 2 mm/h (ref 0–15)

## 2019-05-23 LAB — VITAMIN D 25 HYDROXY (VIT D DEFICIENCY, FRACTURES): Vit D, 25-Hydroxy: 73 ng/mL (ref 30–100)

## 2019-05-23 LAB — CARBAMAZEPINE LEVEL, TOTAL: Carbamazepine Lvl: 8.5 mg/L (ref 4.0–12.0)

## 2019-05-23 LAB — C-REACTIVE PROTEIN: CRP: 1.7 mg/L (ref <8.0)

## 2019-06-01 ENCOUNTER — Other Ambulatory Visit: Payer: Self-pay | Admitting: Family Medicine

## 2019-06-01 DIAGNOSIS — G40909 Epilepsy, unspecified, not intractable, without status epilepticus: Secondary | ICD-10-CM

## 2019-06-01 NOTE — Telephone Encounter (Signed)
Refill request for general medication. Phenobarbital and Carbamezapine   Last office visit 05/21/2019   Follow up on

## 2019-08-03 ENCOUNTER — Other Ambulatory Visit: Payer: Self-pay

## 2019-08-03 ENCOUNTER — Ambulatory Visit (INDEPENDENT_AMBULATORY_CARE_PROVIDER_SITE_OTHER): Payer: Medicare Other

## 2019-08-03 DIAGNOSIS — Z23 Encounter for immunization: Secondary | ICD-10-CM

## 2019-08-22 ENCOUNTER — Other Ambulatory Visit: Payer: Self-pay | Admitting: Family Medicine

## 2019-08-31 ENCOUNTER — Other Ambulatory Visit: Payer: Self-pay | Admitting: Family Medicine

## 2019-09-02 ENCOUNTER — Other Ambulatory Visit: Payer: Self-pay | Admitting: Family Medicine

## 2019-09-03 NOTE — Telephone Encounter (Signed)
Requested medication (s) are due for refill today: yes  Requested medication (s) are on the active medication list: yes  Last refill:  03/22/2019  Future visit scheduled: no  Notes to clinic:  Review for refill Pharmacy keeps sending this script back     Requested Prescriptions  Pending Prescriptions Disp Refills   SF 5000 PLUS 1.1 % CREA dental cream [Pharmacy Med Name: SF 5000 Plus 1.1 % Dental Cream] 51 g 0    Sig: BRUSH ON TEETH TWICE DAILY. NO EATING OR DRINKING FOR 30 MINUTES AFTER USE     Endocrinology:  Minerals Passed - 09/02/2019  3:07 PM      Passed - Valid encounter within last 12 months    Recent Outpatient Visits          3 months ago Leukocytosis, unspecified type   [facility] [place], Drue Stager, MD   7 months ago Urinary tract infection without hematuria, site unspecified   [facility] [place], Drue Stager, MD   7 months ago Urinary tract infection without hematuria, site unspecified   [facility] [name], MD   7 months ago Rhonchi at both lung bases   [facility] [name], MD   12 months ago Seizure disorder [facility])   [facility] [name], MD

## 2019-11-27 ENCOUNTER — Other Ambulatory Visit: Payer: Self-pay | Admitting: Family Medicine

## 2019-11-27 DIAGNOSIS — G40909 Epilepsy, unspecified, not intractable, without status epilepticus: Secondary | ICD-10-CM

## 2019-11-29 ENCOUNTER — Telehealth: Payer: Self-pay | Admitting: Family Medicine

## 2019-11-29 ENCOUNTER — Other Ambulatory Visit: Payer: Self-pay | Admitting: Family Medicine

## 2019-11-29 DIAGNOSIS — G40909 Epilepsy, unspecified, not intractable, without status epilepticus: Secondary | ICD-10-CM

## 2019-11-29 NOTE — Telephone Encounter (Signed)
Requested medication (s) are due for refill today:yes  Requested medication (s) are on the active medication list: yes  Last refill:  10/30/2019  Future visit scheduled: yes  Notes to clinic:  not delegated    Requested Prescriptions  Pending Prescriptions Disp Refills   PHENobarbital (LUMINAL) 64.8 MG tablet [Pharmacy Med Name: PHENobarbital 64.8 MG Oral Tablet] 60 tablet 0    Sig: Take 1 tablet by mouth twice daily      Not Delegated - Neurology: Anticonvulsants - Controlled - phenobarbital Failed - 11/29/2019 10:32 AM      Failed - This refill cannot be delegated      Passed - Phenobarbital in normal range and within 360 days    Phenobarbital, Serum  Date Value Ref Range Status  05/21/2019 25.2 15.0 - 40.0 mg/L Final          Passed - Valid encounter within last 12 months    Recent Outpatient Visits           6 months ago Leukocytosis, unspecified type   [facility] [place], Drue Stager, MD   10 months ago Urinary tract infection without hematuria, site unspecified   [facility] [place], Drue Stager, MD   10 months ago Urinary tract infection without hematuria, site unspecified   [facility] [name], MD   10 months ago Rhonchi at both lung bases   [facility] [name], MD   1 year ago Seizure disorder [facility])   [facility] [name], MD       Future Appointments             In 1 week [name], MD [facility], [facility]

## 2019-12-01 NOTE — Telephone Encounter (Signed)
Message received from pharmacy that transmission failed. Please resend.

## 2019-12-04 MED ORDER — CARBAMAZEPINE 200 MG PO TABS: 200.0000 mg | ORAL_TABLET | Freq: Four times a day (QID) | ORAL | 0 refills | Status: DC

## 2019-12-04 NOTE — Addendum Note (Signed)
Addended by: [name] F on: 12/04/2019 04:58 PM   Modules accepted: Orders

## 2019-12-04 NOTE — Addendum Note (Signed)
Addended by: Chilton Greathouse on: 12/04/2019 08:04 AM   Modules accepted: Orders

## 2019-12-10 ENCOUNTER — Ambulatory Visit (INDEPENDENT_AMBULATORY_CARE_PROVIDER_SITE_OTHER): Payer: Medicare HMO | Admitting: Family Medicine

## 2019-12-10 ENCOUNTER — Encounter: Payer: Self-pay | Admitting: Family Medicine

## 2019-12-10 ENCOUNTER — Other Ambulatory Visit: Payer: Self-pay

## 2019-12-10 VITALS — BP 110/70 | HR 87 | Temp 96.9°F | Resp 16 | Ht 70.0 in | Wt 169.1 lb

## 2019-12-10 DIAGNOSIS — D649 Anemia, unspecified: Secondary | ICD-10-CM

## 2019-12-10 DIAGNOSIS — G40909 Epilepsy, unspecified, not intractable, without status epilepticus: Secondary | ICD-10-CM

## 2019-12-10 DIAGNOSIS — F79 Unspecified intellectual disabilities: Secondary | ICD-10-CM

## 2019-12-10 DIAGNOSIS — Z1211 Encounter for screening for malignant neoplasm of colon: Secondary | ICD-10-CM | POA: Diagnosis not present

## 2019-12-10 DIAGNOSIS — R69 Illness, unspecified: Secondary | ICD-10-CM | POA: Diagnosis not present

## 2019-12-10 DIAGNOSIS — Z79899 Other long term (current) drug therapy: Secondary | ICD-10-CM

## 2019-12-10 DIAGNOSIS — E871 Hypo-osmolality and hyponatremia: Secondary | ICD-10-CM

## 2019-12-10 DIAGNOSIS — E876 Hypokalemia: Secondary | ICD-10-CM | POA: Diagnosis not present

## 2019-12-10 DIAGNOSIS — E785 Hyperlipidemia, unspecified: Secondary | ICD-10-CM | POA: Diagnosis not present

## 2019-12-10 NOTE — Progress Notes (Signed)
Name: Alec Campbell   MRN: [id number]    DOB: [birth year]   Date:12/10/2019       Progress Note  Subjective  Chief Complaint  Chief Complaint  Patient presents with  . Medication Refill  . Intellectual disability with epilepsy  . Seizure disorder  . Allergic Rhinitis   . Dyslipidemia    HPI  Leucocytosis: he was feeling tired, lack of appetite and white count was very high. He had elevated sed rate and CRP, took antibiotics for UTI and repeat labs normalized except for the anemia  Anemia unspecified: he denies pica, SOB or fatigue. We will recheck labs , also advised colonoscopy ( he is unable to take the laxatives ) but mother states cologuard may work for him   Hyponatremia and hypokalemia: his mother does not cook with salt, advised gatorade zero and or propel for him , we will recheck labs today.   Intellectual disability with epilepsy:he lives with his parents, he needs help with medication management, transportation and he is unable to manage his finances, also needs helps when seeing a physician.He developed seizures at 70 weeks of age , he was born with cleft lips and palate. He was diagnosed with developmental delay as a toddler   Seizure disorder: no symptoms in many years, mother is terrified of stopping his medication because his symptoms were severe. He has some hyponatremia  He is now on only two pill of phenobarbitaland 4 pills of carbamazepine daily without any side effects.Recheck labs today    Patient Active Problem List   Diagnosis Date Noted  . Cold sore 02/13/2016  . Vitamin D deficiency 07/20/2015  . Intellectual disability with epilepsy ([place]) 07/16/2015  . Seizure disorder ([place]) 07/16/2015  . Seasonal allergic rhinitis 07/16/2015  . Dyslipidemia 07/16/2015  . Hyponatremia 06/13/2015  . Abnormal blood sugar 06/19/2008    Past Surgical History:  Procedure Laterality Date  . ANKLE SURGERY Right   . CLEFT LIP REPAIR    . HERNIA REPAIR     Inguinal   . TYMPANOSTOMY TUBE PLACEMENT      Family History  Problem Relation Age of Onset  . Hypertension Mother   . Thyroid disease Mother   . CAD Father   . Hypertension Father   . Hyperlipidemia Father   . Healthy Brother   . Healthy Sister   . Diabetes Brother   . COPD Brother   . Emphysema Brother   . Alcohol abuse Brother   . Hypertension Brother   . Aneurysm Brother        brain    Current Outpatient Medications:  .  carbamazepine (TEGRETOL) 200 MG tablet, Take 1 tablet (200 mg total) by mouth 4 (four) times daily., Disp: 120 tablet, Rfl: 0 .  loratadine (CLARITIN) 10 MG tablet, Take 1 tablet (10 mg total) by mouth daily., Disp: 30 tablet, Rfl: 5 .  Multiple Vitamin (MULTIVITAMIN) tablet, Take 1 tablet by mouth daily., Disp: , Rfl:  .  PHENobarbital (LUMINAL) 64.8 MG tablet, Take 1 tablet by mouth twice daily, Disp: 60 tablet, Rfl: 0 .  SF 5000 PLUS 1.1 % CREA dental cream, BRUSH ON TEETH TWICE DAILY. NO EATING OR DRINKING FOR 30 MINUTES AFTER USE, Disp: 51 g, Rfl: 0 .  valACYclovir (VALTREX) 1000 MG tablet, TAKE 1 TABLET BY MOUTH TWICE DAILY AS NEEDED FOR  OUTBREAK, Disp: 30 tablet, Rfl: 0  No Known Allergies  I personally reviewed active problem list, medication list, allergies, family history, social history, health maintenance  with the patient/caregiver today.   ROS  Constitutional: Negative for fever or weight change.  Respiratory: Negative for cough and shortness of breath.   Cardiovascular: Negative for chest pain or palpitations.  Gastrointestinal: Negative for abdominal pain, no bowel changes.  Musculoskeletal: Negative for gait problem or joint swelling.  Skin: Negative for rash.  Neurological: Negative for dizziness or headache.  No other specific complaints in a complete review of systems (except as listed in HPI above).  Objective  Vitals:   12/10/19 1155  BP: 110/70  Pulse: 87  Resp: 16  Temp: (!) 96.9 F (36.1 C)  TempSrc: Temporal  SpO2: 99%   Weight: 169 lb 1.6 oz (76.7 kg)  Height: 5\' 10"  (1.778 m)    Body mass index is 24.26 kg/m.  Physical Exam  Constitutional: Patient appears well-developed and well-nourished.No distress.  HEENT: head atraumatic, normocephalic, pupils equal and reactive to light, oral exam not done  Cardiovascular: Normal rate, regular rhythm and normal heart sounds.  No murmur heard. No BLE edema. Pulmonary/Chest: Effort normal and breath sounds normal. No respiratory distress. Abdominal: Soft.  There is no tenderness. Psychiatric: Patient has a normal mood and affect. behavior is normal. Judgment and thought content normal.  PHQ2/9: Depression screen [facility] 2/9 12/10/2019 02/28/2018 09/27/2017 09/06/2017 08/16/2017  Decreased Interest 0 - 0 0 0  Down, Depressed, Hopeless 0 - 0 0 0  PHQ - 2 Score 0 - 0 0 0  Altered sleeping 0 - - - -  Tired, decreased energy 0 - - - -  Change in appetite 0 - - - -  Feeling bad or failure about yourself  0 - - - -  Trouble concentrating 0 - - - -  Moving slowly or fidgety/restless 0 - - - -  Suicidal thoughts 0 - - - -  PHQ-9 Score 0 - - - -  Difficult doing work/chores Not difficult at all Not difficult at all - - -    phq 9 is negative  Fall Risk: Fall Risk  12/10/2019 05/21/2019 04/05/2019 01/29/2019 01/24/2019  Falls in the past year? 0 0 0 0 0  Number falls in past yr: 0 0 0 0 0  Injury with Fall? 0 0 0 0 0  Risk for fall due to : - - - - -  Risk for fall due to: Comment - - - - -  Follow up - - Falls prevention discussed - -     Functional Status Survey: Is the patient deaf or have difficulty hearing?: No Does the patient have difficulty seeing, even when wearing glasses/contacts?: No Does the patient have difficulty concentrating, remembering, or making decisions?: No Does the patient have difficulty walking or climbing stairs?: No Does the patient have difficulty dressing or bathing?: No Does the patient have difficulty doing errands alone such as  visiting a doctor's office or shopping?: Yes    Assessment & Plan  1. Hyponatremia  Recheck labs  2. Seizure disorder (HCC)  - Carbamazepine Level (Tegretol), total - Phenobarbital level  3. Anemia, unspecified type  - CBC with Differential/Platelet - Iron, TIBC and Ferritin Panel  4. Dyslipidemia  Recheck labs   5. Long-term use of high-risk medication  - COMPLETE METABOLIC PANEL WITH GFR  6. Intellectual disability with epilepsy ([place])  Stable, lives with parents   7. Hypokalemia  Recheck labs  8. Colon cancer screening  - Cologuard

## 2019-12-12 LAB — COMPLETE METABOLIC PANEL WITH GFR
AG Ratio: 1.8 (calc) (ref 1.0–2.5)
ALT: 14 U/L (ref 9–46)
AST: 17 U/L (ref 10–40)
Albumin: 4.6 g/dL (ref 3.6–5.1)
Alkaline phosphatase (APISO): 141 U/L — ABNORMAL HIGH (ref 36–130)
BUN: 10 mg/dL (ref 7–25)
CO2: 28 mmol/L (ref 20–32)
Calcium: 9.7 mg/dL (ref 8.6–10.3)
Chloride: 101 mmol/L (ref 98–110)
Creat: 0.82 mg/dL (ref 0.60–1.35)
GFR, Est African American: 120 mL/min/{1.73_m2} (ref >=60)
GFR, Est Non African American: 104 mL/min/{1.73_m2} (ref >=60)
Globulin: 2.5 g/dL (calc) (ref 1.9–3.7)
Glucose, Bld: 64 mg/dL — ABNORMAL LOW (ref 65–99)
Potassium: 4.1 mmol/L (ref 3.5–5.3)
Sodium: 137 mmol/L (ref 135–146)
Total Bilirubin: 0.3 mg/dL (ref 0.2–1.2)
Total Protein: 7.1 g/dL (ref 6.1–8.1)

## 2019-12-12 LAB — CBC WITH DIFFERENTIAL/PLATELET
Absolute Monocytes: 555 cells/uL (ref 200–950)
Basophils Absolute: 80 cells/uL (ref 0–200)
Basophils Relative: 1.1 %
Eosinophils Absolute: 445 cells/uL (ref 15–500)
Eosinophils Relative: 6.1 %
HCT: 40.1 % (ref 38.5–50.0)
Hemoglobin: 13.8 g/dL (ref 13.2–17.1)
Lymphs Abs: 1964 cells/uL (ref 850–3900)
MCH: 29.9 pg (ref 27.0–33.0)
MCHC: 34.4 g/dL (ref 32.0–36.0)
MCV: 87 fL (ref 80.0–100.0)
MPV: 12.5 fL (ref 7.5–12.5)
Monocytes Relative: 7.6 %
Neutro Abs: 4256 cells/uL (ref 1500–7800)
Neutrophils Relative %: 58.3 %
Platelets: 196 10*3/uL (ref 140–400)
RBC: 4.61 10*6/uL (ref 4.20–5.80)
RDW: 12.2 % (ref 11.0–15.0)
Total Lymphocyte: 26.9 %
WBC: 7.3 10*3/uL (ref 3.8–10.8)

## 2019-12-12 LAB — IRON,TIBC AND FERRITIN PANEL
%SAT: 38 % (calc) (ref 20–48)
Ferritin: 49 ng/mL (ref 38–380)
Iron: 125 ug/dL (ref 50–180)
TIBC: 333 mcg/dL (calc) (ref 250–425)

## 2019-12-12 LAB — LIPID PANEL
Cholesterol: 210 mg/dL — ABNORMAL HIGH (ref <200)
HDL: 41 mg/dL (ref >=40)
LDL Cholesterol (Calc): 132 mg/dL (calc) — ABNORMAL HIGH
Non-HDL Cholesterol (Calc): 169 mg/dL (calc) — ABNORMAL HIGH (ref <130)
Total CHOL/HDL Ratio: 5.1 (calc) — ABNORMAL HIGH (ref <5.0)
Triglycerides: 227 mg/dL — ABNORMAL HIGH (ref <150)

## 2019-12-12 LAB — PHENOBARBITAL LEVEL: Phenobarbital, Serum: 23.2 mg/L (ref 15.0–40.0)

## 2019-12-12 LAB — CARBAMAZEPINE LEVEL, TOTAL: Carbamazepine Lvl: 7.7 mg/L (ref 4.0–12.0)

## 2019-12-14 ENCOUNTER — Telehealth: Payer: Self-pay

## 2019-12-14 DIAGNOSIS — R569 Unspecified convulsions: Secondary | ICD-10-CM | POA: Diagnosis not present

## 2019-12-14 DIAGNOSIS — R030 Elevated blood-pressure reading, without diagnosis of hypertension: Secondary | ICD-10-CM | POA: Diagnosis not present

## 2019-12-14 DIAGNOSIS — B009 Herpesviral infection, unspecified: Secondary | ICD-10-CM | POA: Diagnosis not present

## 2019-12-14 NOTE — Telephone Encounter (Signed)
Copied from [place] [id number]. Topic: General - Inquiry >> Dec 13, 2019 12:28 PM Virl Axe D wrote: Reason for CRM: Pt's mother called regarding lab results from 12/10/19. Requesting callback.

## 2019-12-14 NOTE — Telephone Encounter (Signed)
Glucose was low when he came in, wondering if he was fasting Normal kidney function, alkaline phosphatase is a little igh but stable when compare to previous years and likely from anti-seizure medication Sodium also back to normal range Normal CBC, anemia resolved Normal iron storage Normal carbamazepine and phenobarbital levels  Lipid panel showed increase in triglycerides ( if he was fasting ), but LDL ( bad cholesterol ) has improved  Mother notified of his lab results.

## 2019-12-27 ENCOUNTER — Other Ambulatory Visit: Payer: Self-pay | Admitting: Family Medicine

## 2019-12-27 DIAGNOSIS — G40909 Epilepsy, unspecified, not intractable, without status epilepticus: Secondary | ICD-10-CM

## 2019-12-27 NOTE — Telephone Encounter (Signed)
Requested medication (s) are due for refill today: yes  Requested medication (s) are on the active medication list: yes  Last refill:  12/01/19 #60  Future visit scheduled: yes  Notes to clinic:  refill not delegated per protocol.     Requested Prescriptions  Pending Prescriptions Disp Refills   PHENobarbital (LUMINAL) 64.8 MG tablet [Pharmacy Med Name: PHENobarbital 64.8 MG Oral Tablet] 60 tablet 0    Sig: Take 1 tablet by mouth twice daily      Not Delegated - Neurology: Anticonvulsants - Controlled - phenobarbital Failed - 12/27/2019  4:42 PM      Failed - This refill cannot be delegated      Passed - Phenobarbital in normal range and within 360 days    Phenobarbital, Serum  Date Value Ref Range Status  12/10/2019 23.2 15.0 - 40.0 mg/L Final          Passed - Valid encounter within last 12 months    Recent Outpatient Visits           2 weeks ago Hyponatremia   [facility] [name], MD   7 months ago Leukocytosis, unspecified type   [facility], Drue Stager, MD   11 months ago Urinary tract infection without hematuria, site unspecified   [facility], Drue Stager, MD   11 months ago Urinary tract infection without hematuria, site unspecified   [facility] [name], MD   11 months ago Rhonchi at both lung bases   [facility] [name], MD       Future Appointments             In 3 months  [facility], [place]   In 5 months [name], MD [facility], [facility]

## 2020-02-01 ENCOUNTER — Other Ambulatory Visit: Payer: Self-pay | Admitting: Family Medicine

## 2020-02-01 DIAGNOSIS — G40909 Epilepsy, unspecified, not intractable, without status epilepticus: Secondary | ICD-10-CM

## 2020-03-02 ENCOUNTER — Other Ambulatory Visit: Payer: Self-pay | Admitting: Family Medicine

## 2020-03-02 DIAGNOSIS — G40909 Epilepsy, unspecified, not intractable, without status epilepticus: Secondary | ICD-10-CM

## 2020-03-02 NOTE — Telephone Encounter (Signed)
Requested medication (s) are due for refill today: yes  Requested medication (s) are on the active medication list: yes  Last refill:  02/01/20  Future visit scheduled: yes  Notes to clinic:  Med. Not delegated to NT to RF   Requested Prescriptions  Pending Prescriptions Disp Refills   carbamazepine (TEGRETOL) 200 MG tablet [Pharmacy Med Name: carBAMazepine 200 MG Oral Tablet] 120 tablet 0    Sig: Take 1 tablet by mouth 4 times daily      Not Delegated - Neurology:  Anticonvulsants - carbamazepine Failed - 03/02/2020  3:11 PM      Failed - This refill cannot be delegated      Passed - AST in normal range and within 90 days    AST  Date Value Ref Range Status  12/10/2019 17 10 - 40 U/L Final          Passed - ALT in normal range and within 90 days    ALT  Date Value Ref Range Status  12/10/2019 14 9 - 46 U/L Final          Passed - Carbamazepine (serum) in normal range and within 360 days    Carbamazepine (Tegretol), S  Date Value Ref Range Status  07/27/2016 8.8 4.0 - 12.0 ug/mL Final    Comment:             In conjunction with other antiepileptic drugs                                Therapeutic  4.0 -  8.0                                Toxicity     9.0 - 12.0                                    Carbamazepine alone                                Therapeutic  8.0 - 12.0                                 Detection Limit =  2.0                           <2.0 indicated None Detected    Carbamazepine Lvl  Date Value Ref Range Status  12/10/2019 7.7 4.0 - 12.0 mg/L Final          Passed - WBC in normal range and within 90 days    WBC  Date Value Ref Range Status  12/10/2019 7.3 3.8 - 10.8 Thousand/uL Final          Passed - PLT in normal range and within 90 days    Platelets  Date Value Ref Range Status  12/10/2019 196 140 - 400 Thousand/uL Final  07/27/2016 183 150 - 379 x10E3/uL Final          Passed - HGB in normal range and within 90 days    Hemoglobin   Date Value Ref Range Status  12/10/2019 13.8 13.2 - 17.1 g/dL Final  07/27/2016 13.0 12.6 - 17.7 g/dL Final          Passed - Na in normal range and within 90 days    Sodium  Date Value Ref Range Status  12/10/2019 137 135 - 146 mmol/L Final  07/27/2016 133 (L) 134 - 144 mmol/L Final          Passed - HCT in normal range and within 90 days    HCT  Date Value Ref Range Status  12/10/2019 40.1 38.5 - 50.0 % Final   Hematocrit  Date Value Ref Range Status  07/27/2016 37.5 37.5 - 51.0 % Final          Passed - Valid encounter within last 12 months    Recent Outpatient Visits           2 months ago Hyponatremia   [facility] [name], MD   9 months ago Leukocytosis, unspecified type   [facility] [name], MD   1 year ago Urinary tract infection without hematuria, site unspecified   [facility] [name], MD   1 year ago Urinary tract infection without hematuria, site unspecified   [facility] [name], MD   1 year ago Rhonchi at both lung bases   [facility] [name], MD       Future Appointments             In 1 month  [facility], [place]   In 3 months [name], MD [facility], [facility]

## 2020-03-03 ENCOUNTER — Other Ambulatory Visit: Payer: Self-pay

## 2020-03-03 DIAGNOSIS — G40909 Epilepsy, unspecified, not intractable, without status epilepticus: Secondary | ICD-10-CM

## 2020-03-03 MED ORDER — CARBAMAZEPINE 200 MG PO TABS: ORAL_TABLET | ORAL | 0 refills | Status: DC

## 2020-03-18 DIAGNOSIS — Z1211 Encounter for screening for malignant neoplasm of colon: Secondary | ICD-10-CM | POA: Diagnosis not present

## 2020-03-28 LAB — COLOGUARD: Cologuard: NEGATIVE

## 2020-03-31 ENCOUNTER — Other Ambulatory Visit: Payer: Self-pay | Admitting: Family Medicine

## 2020-03-31 DIAGNOSIS — G40909 Epilepsy, unspecified, not intractable, without status epilepticus: Secondary | ICD-10-CM

## 2020-04-02 ENCOUNTER — Other Ambulatory Visit: Payer: Self-pay | Admitting: Family Medicine

## 2020-04-02 DIAGNOSIS — G40909 Epilepsy, unspecified, not intractable, without status epilepticus: Secondary | ICD-10-CM

## 2020-04-07 NOTE — Addendum Note (Signed)
Addended by: Clemetine Marker D on: 04/07/2020 01:40 PM   Modules accepted: Level of Service

## 2020-04-08 ENCOUNTER — Ambulatory Visit (INDEPENDENT_AMBULATORY_CARE_PROVIDER_SITE_OTHER): Payer: Medicare HMO

## 2020-04-08 DIAGNOSIS — Z0000 Encounter for general adult medical examination without abnormal findings: Secondary | ICD-10-CM

## 2020-04-08 NOTE — Progress Notes (Signed)
Subjective:   Alec Campbell is a 49 y.o. male who presents for Medicare Annual/Subsequent preventive examination.  Virtual Visit via Telephone Note  I connected with  Alec Campbell on 04/08/20 at 10:00 AM EDT by telephone and verified that I am speaking with the correct person using two identifiers.  Medicare Annual Wellness visit completed telephonically due to Covid-19 pandemic.   Location: Patient: home Provider: office   I discussed the limitations, risks, security and privacy concerns of performing an evaluation and management service by telephone and the availability of in person appointments. The patient expressed understanding and agreed to proceed.  Unable to perform video visit due to patient does not have video capability.   Some vital signs may be absent or patient reported.   Clemetine Marker, LPN    Review of Systems:   Cardiac Risk Factors include: dyslipidemia;male gender     Objective:    Vitals: There were no vitals taken for this visit.  There is no height or weight on file to calculate BMI.  Advanced Directives 04/05/2019 02/28/2018 08/16/2017 04/21/2017 01/19/2017 07/21/2016 01/19/2016  Does Patient Have a Medical Advance Directive? No No No No No No No  Would patient like information on creating a medical advance directive? No - Guardian declined Yes (MAU/Ambulatory/Procedural Areas - Information given) - - - No - patient declined information -    Tobacco Social History   Tobacco Use  Smoking Status Never Smoker  Smokeless Tobacco Never Used  Tobacco Comment   smoking cessation materials not required     Counseling given: Not Answered Comment: smoking cessation materials not required   Clinical Intake:  Pre-visit preparation completed: Yes  Pain : No/denies pain     Nutritional Risks: None Diabetes: No  How often do you need to have someone help you when you read instructions, pamphlets, or other written materials from your doctor or  pharmacy?: 4 - Often  Interpreter Needed?: No  Information entered by :: Clemetine Marker LPN  Past Medical History:  Diagnosis Date  . Allergy   . Moderate mental retardation   . Seizures ([place])   . Vitamin D deficiency    Past Surgical History:  Procedure Laterality Date  . ANKLE SURGERY Right   . CLEFT LIP REPAIR    . HERNIA REPAIR     Inguinal  . TYMPANOSTOMY TUBE PLACEMENT     Family History  Problem Relation Age of Onset  . Hypertension Mother   . Thyroid disease Mother   . CAD Father   . Hypertension Father   . Hyperlipidemia Father   . Healthy Brother   . Healthy Sister   . Diabetes Brother   . COPD Brother   . Emphysema Brother   . Alcohol abuse Brother   . Hypertension Brother   . Aneurysm Brother        brain   Social History   Socioeconomic History  . Marital status: Single    Spouse name: Not on file  . Number of children: 0  . Years of education: Handicapped diploma  . Highest education level: Not on file  Occupational History    Employer: DISABLED  Tobacco Use  . Smoking status: Never Smoker  . Smokeless tobacco: Never Used  . Tobacco comment: smoking cessation materials not required  Substance and Sexual Activity  . Alcohol use: No    Alcohol/week: 0.0 standard drinks  . Drug use: No  . Sexual activity: Never  Other Topics Concern  . Not on  file  Social History Narrative   Moderate mental retardation. Parents are the primary caregiver, he lives with them   Social Determinants of Health   Financial Resource Strain: Low Risk   . Difficulty of Paying Living Expenses: Not hard at all  Food Insecurity: No Food Insecurity  . Worried About Charity fundraiser in the Last Year: Never true  . Ran Out of Food in the Last Year: Never true  Transportation Needs: No Transportation Needs  . Lack of Transportation (Medical): No  . Lack of Transportation (Non-Medical): No  Physical Activity: Insufficiently Active  . Days of Exercise per Week: 7 days    . Minutes of Exercise per Session: 20 min  Stress: No Stress Concern Present  . Feeling of Stress : Not at all  Social Connections: Severely Isolated  . Frequency of Communication with Friends and Family: Once a week  . Frequency of Social Gatherings with Friends and Family: Once a week  . Attends Religious Services: Never  . Active Member of Clubs or Organizations: No  . Attends Archivist Meetings: Never  . Marital Status: Never married    Outpatient Encounter Medications as of 04/08/2020  Medication Sig  . carbamazepine (EPITOL) 200 MG tablet Take 1 tablet by mouth 4 times daily  . loratadine (CLARITIN) 10 MG tablet Take 1 tablet (10 mg total) by mouth daily.  . Multiple Vitamin (MULTIVITAMIN) tablet Take 1 tablet by mouth daily.  Mar[place] Kitchen PHENobarbital (LUMINAL) 64.8 MG tablet Take 1 tablet by mouth twice daily  . SF 5000 PLUS 1.1 % CREA dental cream BRUSH ON TEETH TWICE DAILY. NO EATING OR DRINKING FOR 30 MINUTES AFTER USE  . valACYclovir (VALTREX) 1000 MG tablet TAKE 1 TABLET BY MOUTH TWICE DAILY AS NEEDED FOR  OUTBREAK   No facility-administered encounter medications on file as of 04/08/2020.    Activities of Daily Living In your present state of health, do you have any difficulty performing the following activities: 04/08/2020 12/10/2019  Hearing? N N  Comment declines hearing aids -  Vision? N N  Difficulty concentrating or making decisions? N N  Walking or climbing stairs? N N  Dressing or bathing? N N  Doing errands, shopping? N Y  Conservation officer, nature and eating ? N -  Using the Toilet? N -  In the past six months, have you accidently leaked urine? N -  Do you have problems with loss of bowel control? N -  Managing your Medications? Y -  Managing your Finances? Y -  Housekeeping or managing your Housekeeping? N -  Some recent data might be hidden    Patient Care Team: [name], MD as PCP - General (Family Medicine)   Assessment:   This is a routine wellness  examination for [place].  Exercise Activities and Dietary recommendations Current Exercise Habits: Home exercise routine, Type of exercise: walking, Time (Minutes): 20, Frequency (Times/Week): 7, Weekly Exercise (Minutes/Week): 140, Intensity: Mild, Exercise limited by: None identified  Goals    . DIET - INCREASE WATER INTAKE     Recommend to drink at least 6-8 8oz glasses of water per day.       Fall Risk Fall Risk  04/08/2020 12/10/2019 05/21/2019 04/05/2019 01/29/2019  Falls in the past year? 0 0 0 0 0  Number falls in past yr: 0 0 0 0 0  Injury with Fall? 0 0 0 0 0  Risk for fall due to : No Fall Risks - - - -  Risk for fall due to: Comment - - - - -  Follow up Falls prevention discussed - - Falls prevention discussed -   FALL RISK PREVENTION PERTAINING TO THE HOME:  Any stairs in or around the home? Yes  If so, are there any without handrails? Yes   Home free of loose throw rugs in walkways, pet beds, electrical cords, etc? Yes  Adequate lighting in your home to reduce risk of falls? Yes   ASSISTIVE DEVICES UTILIZED TO PREVENT FALLS:  Life alert? No  Use of a cane, walker or w/c? No  Grab bars in the bathroom? No  Shower chair or bench in shower? No  Elevated toilet seat or a handicapped toilet? No   DME ORDERS:  DME order needed?  No   TIMED UP AND GO:  Was the test performed? No . Telephonic visit.   Education: Fall risk prevention has been discussed.  Intervention(s) required? No    DME/home health order needed?  No    Depression Screen PHQ 2/9 Scores 04/08/2020 12/10/2019 05/21/2019 04/05/2019  PHQ - 2 Score 0 0 - -  PHQ- 9 Score - 0 - -  Exception Documentation - - Medical reason Medical reason    Cognitive Function        Immunization History  Administered Date(s) Administered  . Influenza Split 10/06/2009, 07/21/2012  . Influenza, Seasonal, Injecte, Preservative Fre 06/23/2010  . Influenza,inj,Quad PF,6+ Mos 07/23/2013, 08/02/2014, 07/18/2015,  07/21/2016, 08/03/2017, 08/07/2018, 08/03/2019  . Influenza-Unspecified 08/02/2014  . Moderna SARS-COVID-2 Vaccination 12/05/2019, 01/03/2020  . Tdap 06/23/2010    Qualifies for Shingles Vaccine? Yes . Due for Shingrix. Education has been provided regarding the importance of this vaccine. Pt has been advised to call insurance company to determine out of pocket expense. Advised may also receive vaccine at local pharmacy or Health Dept. Verbalized acceptance and understanding.  Tdap: Up to date  Flu Vaccine: Up to date  Pneumococcal Vaccine: Due for Pneumococcal vaccine at age 35.  Covid-19 Vaccine: Up to date  Screening Tests Health Maintenance  Topic Date Due  . Hepatitis C Screening  Never done  . INFLUENZA VACCINE  06/01/2020  . TETANUS/TDAP  06/23/2020  . Fecal DNA (Cologuard)  03/19/2023  . COVID-19 Vaccine  Completed  . HIV Screening  Discontinued   Cancer Screenings:  Colorectal Screening: Cologuard completed 03/18/20. Repeat every 3 years;   Lung Cancer Screening: (Low Dose CT Chest recommended if Age 102-80 years, 30 pack-year currently smoking OR have quit w/in 15years.) does not qualify.   Additional Screening:  Hepatitis C Screening: does qualify; postponed  Vision Screening: Recommended annual ophthalmology exams for early detection of glaucoma and other disorders of the eye. Is the patient up to date with their annual eye exam?  No  Who is the provider or what is the name of the office in which the pt attends annual eye exams? Not established If pt is not established with a provider, would they like to be referred to a provider to establish care? No .   Dental Screening: Recommended annual dental exams for proper oral hygiene  Community Resource Referral:  CRR required this visit?  No       Plan:    I have personally reviewed and addressed the Medicare Annual Wellness questionnaire and have noted the following in the patient's chart:  A. Medical and  social history B. Use of alcohol, tobacco or illicit drugs  C. Current medications and supplements D. Functional ability and status E.  Nutritional status F.  Physical activity G. Advance directives H. List of other physicians I.  Hospitalizations, surgeries, and ER visits in previous 12 months J.  [place] such as hearing and vision if needed, cognitive and depression L. Referrals and appointments   In addition, I have reviewed and discussed with patient certain preventive protocols, quality metrics, and best practice recommendations. A written personalized care plan for preventive services as well as general preventive health recommendations were provided to patient.   Signed,  Clemetine Marker, LPN Nurse Health Advisor   Nurse Notes: pt accompanied during visit today by his mother, [name]. Pt overall doing well with no concerns.

## 2020-04-08 NOTE — Patient Instructions (Signed)
Alec Campbell , Thank you for taking time to come for your Medicare Wellness Visit. I appreciate your ongoing commitment to your health goals. Please review the following plan we discussed and let me know if I can assist you in the future.   Screening recommendations/referrals: Colonoscopy: Cologuard completed 03/18/20 Recommended yearly ophthalmology/optometry visit for glaucoma screening and checkup Recommended yearly dental visit for hygiene and checkup  Vaccinations: Influenza vaccine: done 08/03/19 Pneumococcal vaccine: due age 50 Tdap vaccine: done 06/23/10 Shingles vaccine: Shingrix discussed. Please contact your pharmacy for coverage information.  Covid-19: done 12/05/19 & 01/03/20  Conditions/risks identified: Keep up the great work!  Next appointment: Follow up in one year for your annual wellness visit   Preventive Care 40-64 Years, Male Preventive care refers to lifestyle choices and visits with your health care provider that can promote health and wellness. What does preventive care include?  A yearly physical exam. This is also called an annual well check.  Dental exams once or twice a year.  Routine eye exams. Ask your health care provider how often you should have your eyes checked.  Personal lifestyle choices, including:  Daily care of your teeth and gums.  Regular physical activity.  Eating a healthy diet.  Avoiding tobacco and drug use.  Limiting alcohol use.  Practicing safe sex.  Taking low-dose aspirin every day starting at age 34. What happens during an annual well check? The services and screenings done by your health care provider during your annual well check will depend on your age, overall health, lifestyle risk factors, and family history of disease. Counseling  Your health care provider may ask you questions about your:  Alcohol use.  Tobacco use.  Drug use.  Emotional well-being.  Home and relationship well-being.  Sexual  activity.  Eating habits.  Work and work Statistician. Screening  You may have the following tests or measurements:  Height, weight, and BMI.  Blood pressure.  Lipid and cholesterol levels. These may be checked every 5 years, or more frequently if you are over 42 years old.  Skin check.  Lung cancer screening. You may have this screening every year starting at age 26 if you have a 30-pack-year history of smoking and currently smoke or have quit within the past 15 years.  Fecal occult blood test (FOBT) of the stool. You may have this test every year starting at age 77.  Flexible sigmoidoscopy or colonoscopy. You may have a sigmoidoscopy every 5 years or a colonoscopy every 10 years starting at age 52.  Prostate cancer screening. Recommendations will vary depending on your family history and other risks.  Hepatitis C blood test.  Hepatitis B blood test.  Sexually transmitted disease (STD) testing.  Diabetes screening. This is done by checking your blood sugar (glucose) after you have not eaten for a while (fasting). You may have this done every 1-3 years. Discuss your test results, treatment options, and if necessary, the need for more tests with your health care provider. Vaccines  Your health care provider may recommend certain vaccines, such as:  Influenza vaccine. This is recommended every year.  Tetanus, diphtheria, and acellular pertussis (Tdap, Td) vaccine. You may need a Td booster every 10 years.  Zoster vaccine. You may need this after age 73.  Pneumococcal 13-valent conjugate (PCV13) vaccine. You may need this if you have certain conditions and have not been vaccinated.  Pneumococcal polysaccharide (PPSV23) vaccine. You may need one or two doses if you smoke cigarettes or if you  have certain conditions. Talk to your health care provider about which screenings and vaccines you need and how often you need them. This information is not intended to replace advice  given to you by your health care provider. Make sure you discuss any questions you have with your health care provider. Document Released: 11/14/2015 Document Revised: 07/07/2016 Document Reviewed: 08/19/2015 Elsevier Interactive Patient Education  2017 [place] Prevention in the Home Falls can cause injuries. They can happen to people of all ages. There are many things you can do to make your home safe and to help prevent falls. What can I do on the outside of my home?  Regularly fix the edges of walkways and driveways and fix any cracks.  Remove anything that might make you trip as you walk through a door, such as a raised step or threshold.  Trim any bushes or trees on the path to your home.  Use bright outdoor lighting.  Clear any walking paths of anything that might make someone trip, such as rocks or tools.  Regularly check to see if handrails are loose or broken. Make sure that both sides of any steps have handrails.  Any raised decks and porches should have guardrails on the edges.  Have any leaves, snow, or ice cleared regularly.  Use sand or salt on walking paths during winter.  Clean up any spills in your garage right away. This includes oil or grease spills. What can I do in the bathroom?  Use night lights.  Install grab bars by the toilet and in the tub and shower. Do not use towel bars as grab bars.  Use non-skid mats or decals in the tub or shower.  If you need to sit down in the shower, use a plastic, non-slip stool.  Keep the floor dry. Clean up any water that spills on the floor as soon as it happens.  Remove soap buildup in the tub or shower regularly.  Attach bath mats securely with double-sided non-slip rug tape.  Do not have throw rugs and other things on the floor that can make you trip. What can I do in the bedroom?  Use night lights.  Make sure that you have a light by your bed that is easy to reach.  Do not use any sheets or  blankets that are too big for your bed. They should not hang down onto the floor.  Have a firm chair that has side arms. You can use this for support while you get dressed.  Do not have throw rugs and other things on the floor that can make you trip. What can I do in the kitchen?  Clean up any spills right away.  Avoid walking on wet floors.  Keep items that you use a lot in easy-to-reach places.  If you need to reach something above you, use a strong step stool that has a grab bar.  Keep electrical cords out of the way.  Do not use floor polish or wax that makes floors slippery. If you must use wax, use non-skid floor wax.  Do not have throw rugs and other things on the floor that can make you trip. What can I do with my stairs?  Do not leave any items on the stairs.  Make sure that there are handrails on both sides of the stairs and use them. Fix handrails that are broken or loose. Make sure that handrails are as long as the stairways.  Check any carpeting  to make sure that it is firmly attached to the stairs. Fix any carpet that is loose or worn.  Avoid having throw rugs at the top or bottom of the stairs. If you do have throw rugs, attach them to the floor with carpet tape.  Make sure that you have a light switch at the top of the stairs and the bottom of the stairs. If you do not have them, ask someone to add them for you. What else can I do to help prevent falls?  Wear shoes that:  Do not have high heels.  Have rubber bottoms.  Are comfortable and fit you well.  Are closed at the toe. Do not wear sandals.  If you use a stepladder:  Make sure that it is fully opened. Do not climb a closed stepladder.  Make sure that both sides of the stepladder are locked into place.  Ask someone to hold it for you, if possible.  Clearly mark and make sure that you can see:  Any grab bars or handrails.  First and last steps.  Where the edge of each step is.  Use tools  that help you move around (mobility aids) if they are needed. These include:  Canes.  Walkers.  Scooters.  Crutches.  Turn on the lights when you go into a dark area. Replace any light bulbs as soon as they burn out.  Set up your furniture so you have a clear path. Avoid moving your furniture around.  If any of your floors are uneven, fix them.  If there are any pets around you, be aware of where they are.  Review your medicines with your doctor. Some medicines can make you feel dizzy. This can increase your chance of falling. Ask your doctor what other things that you can do to help prevent falls. This information is not intended to replace advice given to you by your health care provider. Make sure you discuss any questions you have with your health care provider. Document Released: 08/14/2009 Document Revised: 03/25/2016 Document Reviewed: 11/22/2014 Elsevier Interactive Patient Education  2017 [employer].

## 2020-05-02 ENCOUNTER — Other Ambulatory Visit: Payer: Self-pay | Admitting: Family Medicine

## 2020-05-02 DIAGNOSIS — G40909 Epilepsy, unspecified, not intractable, without status epilepticus: Secondary | ICD-10-CM

## 2020-05-02 NOTE — Telephone Encounter (Signed)
Requested medication (s) are due for refill today: yes  Requested medication (s) are on the active medication list: yes  Last refill:  03/31/2020  Future visit scheduled: yes  Notes to clinic: this refill cannot be delegated    Requested Prescriptions  Pending Prescriptions Disp Refills   carbamazepine (TEGRETOL) 200 MG tablet [Pharmacy Med Name: carBAMazepine 200 MG Oral Tablet] 120 tablet 0    Sig: Take 1 tablet by mouth 4 times daily      Not Delegated - Neurology:  Anticonvulsants - carbamazepine Failed - 05/02/2020 11:30 AM      Failed - This refill cannot be delegated      Failed - AST in normal range and within 90 days    AST  Date Value Ref Range Status  12/10/2019 17 10 - 40 U/L Final          Failed - ALT in normal range and within 90 days    ALT  Date Value Ref Range Status  12/10/2019 14 9 - 46 U/L Final          Failed - WBC in normal range and within 90 days    WBC  Date Value Ref Range Status  12/10/2019 7.3 3.8 - 10.8 Thousand/uL Final          Failed - PLT in normal range and within 90 days    Platelets  Date Value Ref Range Status  12/10/2019 196 140 - 400 Thousand/uL Final  07/27/2016 183 150 - 379 x10E3/uL Final          Failed - HGB in normal range and within 90 days    Hemoglobin  Date Value Ref Range Status  12/10/2019 13.8 13.2 - 17.1 g/dL Final  07/27/2016 13.0 12.6 - 17.7 g/dL Final          Failed - Na in normal range and within 90 days    Sodium  Date Value Ref Range Status  12/10/2019 137 135 - 146 mmol/L Final  07/27/2016 133 (L) 134 - 144 mmol/L Final          Failed - HCT in normal range and within 90 days    HCT  Date Value Ref Range Status  12/10/2019 40.1 38 - 50 % Final   Hematocrit  Date Value Ref Range Status  07/27/2016 37.5 37.5 - 51.0 % Final          Passed - Carbamazepine (serum) in normal range and within 360 days    Carbamazepine (Tegretol), S  Date Value Ref Range Status  07/27/2016 8.8 4.0 -  12.0 ug/mL Final    Comment:             In conjunction with other antiepileptic drugs                                Therapeutic  4.0 -  8.0                                Toxicity     9.0 - 12.0                                    Carbamazepine alone  Therapeutic  8.0 - 12.0                                 Detection Limit =  2.0                           <2.0 indicated None Detected    Carbamazepine Lvl  Date Value Ref Range Status  12/10/2019 7.7 4.0 - 12.0 mg/L Final          Passed - Valid encounter within last 12 months    Recent Outpatient Visits           4 months ago Hyponatremia   [facility] [name], MD   11 months ago Leukocytosis, unspecified type   [facility] [name], MD   1 year ago Urinary tract infection without hematuria, site unspecified   [facility] [name], MD   1 year ago Urinary tract infection without hematuria, site unspecified   [facility] [name], MD   1 year ago Rhonchi at both lung bases   [facility] [name], MD       Future Appointments             In 1 month Ancil Boozer, Drue Stager, MD [facility], [place]   In 11 months  [facility]

## 2020-05-30 ENCOUNTER — Other Ambulatory Visit: Payer: Self-pay | Admitting: Family Medicine

## 2020-05-30 DIAGNOSIS — G40909 Epilepsy, unspecified, not intractable, without status epilepticus: Secondary | ICD-10-CM

## 2020-05-30 NOTE — Telephone Encounter (Signed)
Requested medication (s) are due for refill today: no  Requested medication (s) are on the active medication list: yes  Last refill:  05/02/2020  Future visit scheduled: yes  Notes to clinic: this refill cannot be delegated    Requested Prescriptions  Pending Prescriptions Disp Refills   carbamazepine (TEGRETOL) 200 MG tablet [Pharmacy Med Name: carBAMazepine 200 MG Oral Tablet] 120 tablet 0    Sig: Take 1 tablet by mouth 4 times daily      Not Delegated - Neurology:  Anticonvulsants - carbamazepine Failed - 05/30/2020 11:18 AM      Failed - This refill cannot be delegated      Failed - AST in normal range and within 90 days    AST  Date Value Ref Range Status  12/10/2019 17 10 - 40 U/L Final          Failed - ALT in normal range and within 90 days    ALT  Date Value Ref Range Status  12/10/2019 14 9 - 46 U/L Final          Failed - WBC in normal range and within 90 days    WBC  Date Value Ref Range Status  12/10/2019 7.3 3.8 - 10.8 Thousand/uL Final          Failed - PLT in normal range and within 90 days    Platelets  Date Value Ref Range Status  12/10/2019 196 140 - 400 Thousand/uL Final  07/27/2016 183 150 - 379 x10E3/uL Final          Failed - HGB in normal range and within 90 days    Hemoglobin  Date Value Ref Range Status  12/10/2019 13.8 13.2 - 17.1 g/dL Final  07/27/2016 13.0 12.6 - 17.7 g/dL Final          Failed - Na in normal range and within 90 days    Sodium  Date Value Ref Range Status  12/10/2019 137 135 - 146 mmol/L Final  07/27/2016 133 (L) 134 - 144 mmol/L Final          Failed - HCT in normal range and within 90 days    HCT  Date Value Ref Range Status  12/10/2019 40.1 38 - 50 % Final   Hematocrit  Date Value Ref Range Status  07/27/2016 37.5 37.5 - 51.0 % Final          Passed - Carbamazepine (serum) in normal range and within 360 days    Carbamazepine (Tegretol), S  Date Value Ref Range Status  07/27/2016 8.8 4.0 - 12.0  ug/mL Final    Comment:             In conjunction with other antiepileptic drugs                                Therapeutic  4.0 -  8.0                                Toxicity     9.0 - 12.0                                    Carbamazepine alone  Therapeutic  8.0 - 12.0                                 Detection Limit =  2.0                           <2.0 indicated None Detected    Carbamazepine Lvl  Date Value Ref Range Status  12/10/2019 7.7 4.0 - 12.0 mg/L Final          Passed - Valid encounter within last 12 months    Recent Outpatient Visits           5 months ago Hyponatremia   [facility] [name], MD   1 year ago Leukocytosis, unspecified type   [facility] [name], MD   1 year ago Urinary tract infection without hematuria, site unspecified   [facility] [name], MD   1 year ago Urinary tract infection without hematuria, site unspecified   [facility] [name], MD   1 year ago Rhonchi at both lung bases   [facility] [name], MD       Future Appointments             In 1 week [name], MD [facility], [place]   In 10 months  [facility]

## 2020-06-01 IMAGING — CR CHEST - 2 VIEW
1 series · 3 of 3 positions shown · non-contrast
Comparison: 02/13/2016

CLINICAL DATA: Fever.  Fatigue.

EXAM:
CHEST - 2 VIEW

[Series 1: dg chest 2 view · 0.14mm/px · 3 of 3 slices shown]
[im 1/3]
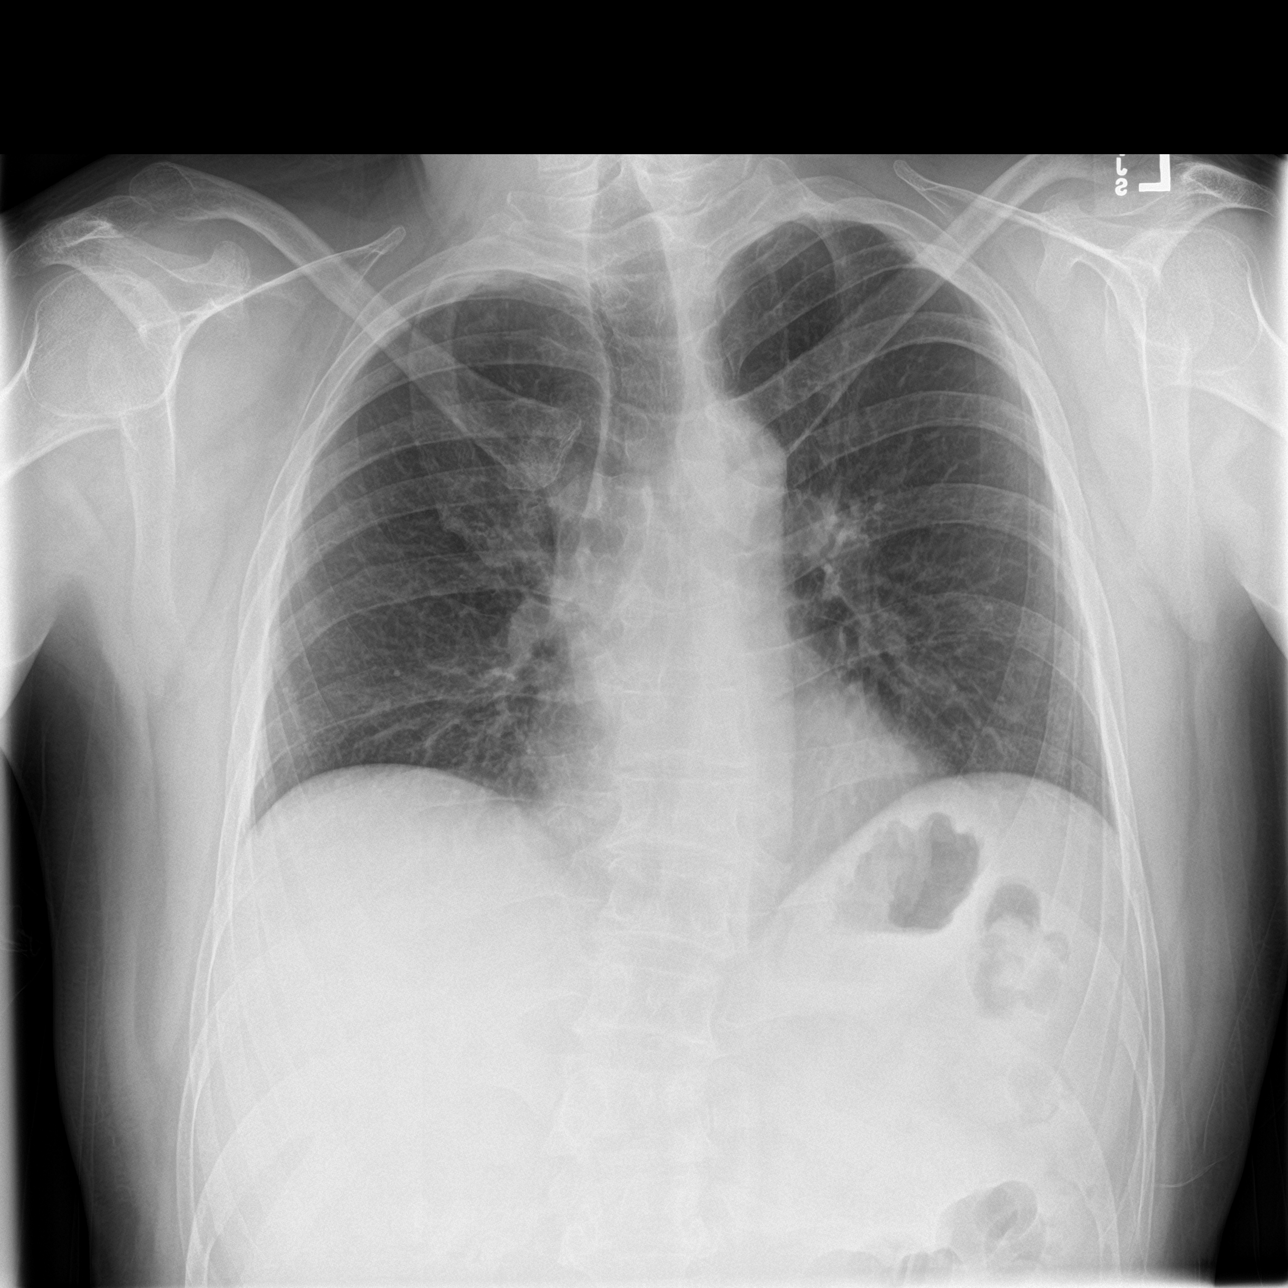
[im 2/3]
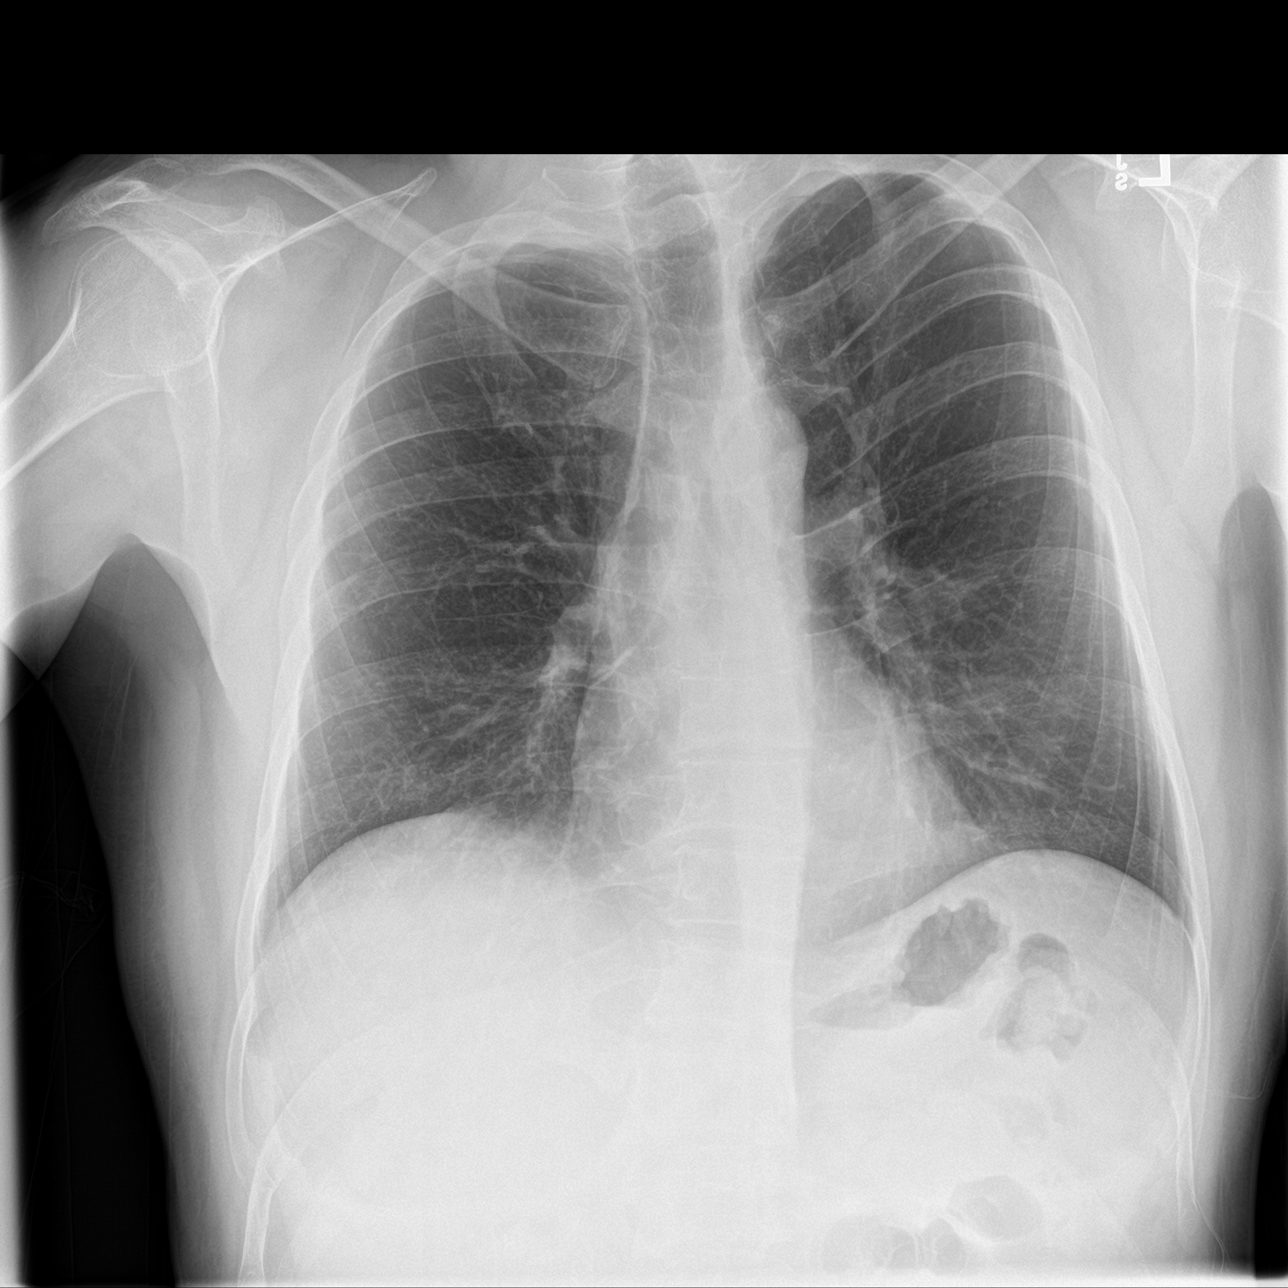
[im 3/3]
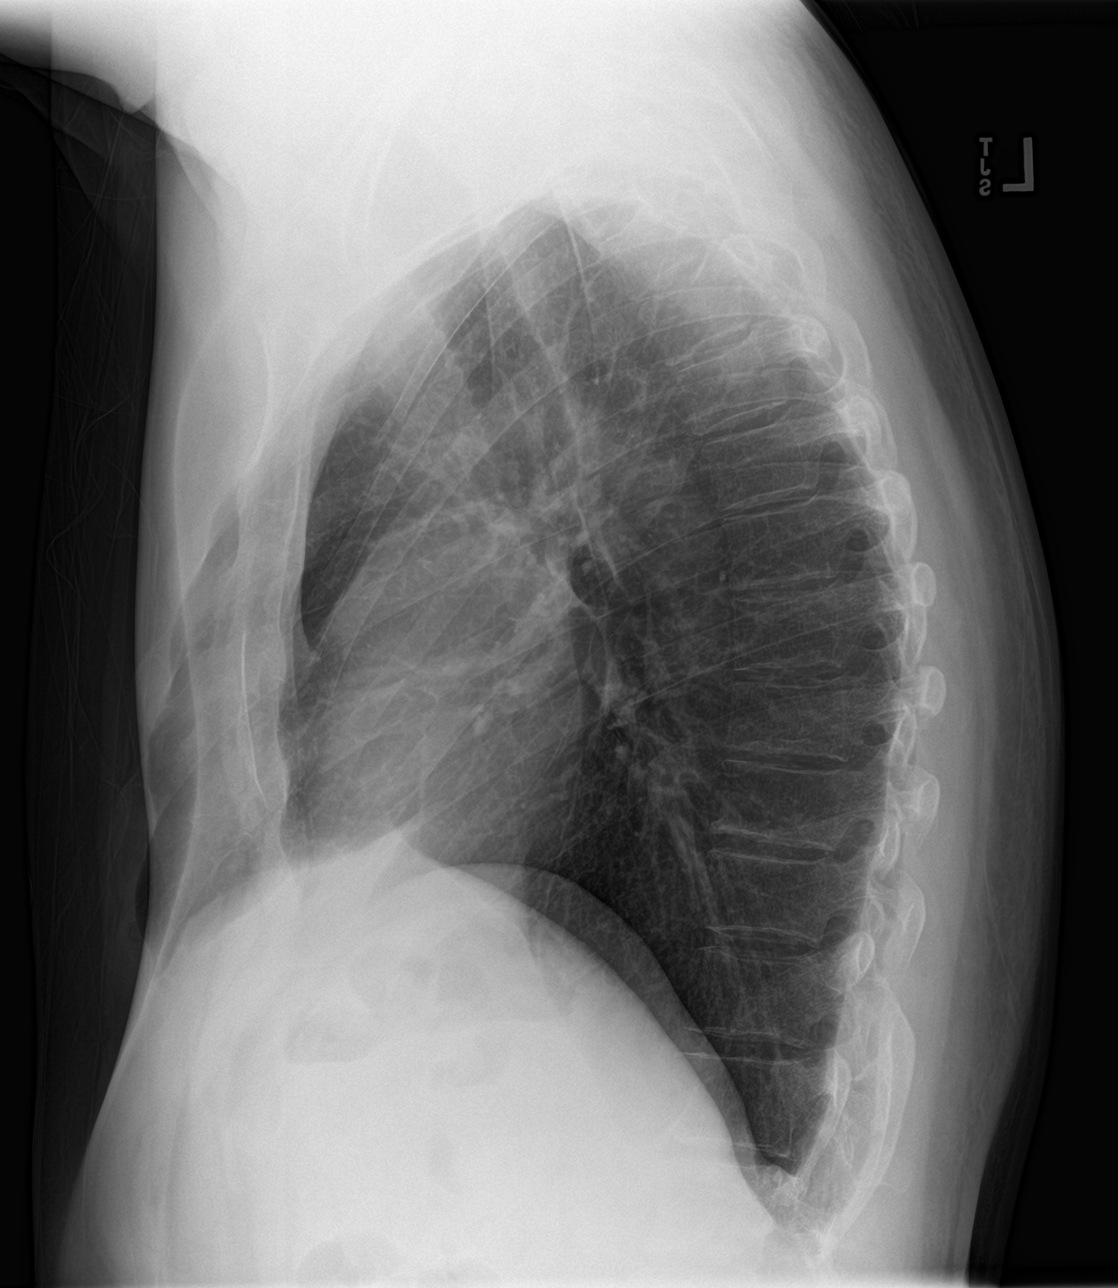

[3 of 3 positions shown; findings below may reference images not displayed]

FINDINGS: Normal heart size. Lungs clear. No pneumothorax. No pleural
effusion.
IMPRESSION: No active cardiopulmonary disease.

## 2020-06-09 ENCOUNTER — Encounter: Payer: Self-pay | Admitting: Family Medicine

## 2020-06-09 ENCOUNTER — Other Ambulatory Visit: Payer: Self-pay

## 2020-06-09 ENCOUNTER — Ambulatory Visit (INDEPENDENT_AMBULATORY_CARE_PROVIDER_SITE_OTHER): Payer: Medicare HMO | Admitting: Family Medicine

## 2020-06-09 VITALS — BP 100/70 | HR 93 | Temp 97.3°F | Resp 16 | Ht 70.0 in | Wt 174.3 lb

## 2020-06-09 DIAGNOSIS — R69 Illness, unspecified: Secondary | ICD-10-CM | POA: Diagnosis not present

## 2020-06-09 DIAGNOSIS — G40909 Epilepsy, unspecified, not intractable, without status epilepticus: Secondary | ICD-10-CM

## 2020-06-09 DIAGNOSIS — E785 Hyperlipidemia, unspecified: Secondary | ICD-10-CM | POA: Diagnosis not present

## 2020-06-09 DIAGNOSIS — E559 Vitamin D deficiency, unspecified: Secondary | ICD-10-CM | POA: Diagnosis not present

## 2020-06-09 DIAGNOSIS — Z23 Encounter for immunization: Secondary | ICD-10-CM

## 2020-06-09 DIAGNOSIS — F79 Unspecified intellectual disabilities: Secondary | ICD-10-CM | POA: Diagnosis not present

## 2020-06-09 DIAGNOSIS — K4091 Unilateral inguinal hernia, without obstruction or gangrene, recurrent: Secondary | ICD-10-CM

## 2020-06-09 DIAGNOSIS — B001 Herpesviral vesicular dermatitis: Secondary | ICD-10-CM | POA: Diagnosis not present

## 2020-06-09 MED ORDER — VALACYCLOVIR HCL 1 G PO TABS: ORAL_TABLET | ORAL | 0 refills | Status: DC

## 2020-06-09 MED ORDER — PHENOBARBITAL 64.8 MG PO TABS: 64.8000 mg | ORAL_TABLET | Freq: Two times a day (BID) | ORAL | 1 refills | Status: DC

## 2020-06-09 MED ORDER — CARBAMAZEPINE 200 MG PO TABS: 200.0000 mg | ORAL_TABLET | Freq: Four times a day (QID) | ORAL | 1 refills | Status: DC

## 2020-06-09 NOTE — Addendum Note (Signed)
Addended by: [name] F on: 06/09/2020 11:03 AM   Modules accepted: Orders

## 2020-06-09 NOTE — Progress Notes (Addendum)
Name: Alec Campbell   MRN: [id number]    DOB: [birth year]   Date:06/09/2020       Progress Note  Subjective  Chief Complaint  Chief Complaint  Patient presents with   Dyslipidemia   Hyponatremia   Seizures   Hernia    Mom has concerns about hernia increasing in size. He denies pain.    HPI  Hyponatremia and hypokalemia:last levels were normal   Intellectual disability with epilepsy:he lives with his parents, he needs help with medication management, transportation and he is unable to manage his finances, also needs helps when seeing a physician.He developed seizures at 58 weeksof age ,he was born with cleft lips and palate. He was diagnosed with developmental delay as a toddler , he has been stable. Able to do ADL   Seizure disorder: no symptoms in many years, mother is terrified of stopping his medication because his symptoms were severe. He has some hyponatremia He is now on only two pill of phenobarbitaland 4 pills of carbamazepine daily without any side effects.Reviewed last labs, alkaline phosphatase a little elevated.  Dyslipidemia: not on medication, discussed life style modification  Inguinal hernia: he had an inguinal hernia repair on right side, mother has noticed it getting larger, does not seem to be cause any problems at this time  Cold sore: no recent outbreaks but needs a refill  Patient Active Problem List   Diagnosis Date Noted   Cold sore 02/13/2016   Vitamin D deficiency 07/20/2015   Intellectual disability with epilepsy ([place]) 07/16/2015   Seizure disorder ([place]) 07/16/2015   Seasonal allergic rhinitis 07/16/2015   Dyslipidemia 07/16/2015   Hyponatremia 06/13/2015   Abnormal blood sugar 06/19/2008    Past Surgical History:  Procedure Laterality Date   ANKLE SURGERY Right    CLEFT LIP REPAIR     HERNIA REPAIR     Inguinal   TYMPANOSTOMY TUBE PLACEMENT      Family History  Problem Relation Age of Onset   Hypertension Mother     Thyroid disease Mother    CAD Father    Hypertension Father    Hyperlipidemia Father    Healthy Brother    Healthy Sister    Diabetes Brother    COPD Brother    Emphysema Brother    Alcohol abuse Brother    Hypertension Brother    Aneurysm Brother        brain    Social History   Tobacco Use   Smoking status: Never Smoker   Smokeless tobacco: Never Used   Tobacco comment: smoking cessation materials not required  Substance Use Topics   Alcohol use: No    Alcohol/week: 0.0 standard drinks     Current Outpatient Medications:    carbamazepine (TEGRETOL) 200 MG tablet, Take 1 tablet by mouth 4 times daily, Disp: 120 tablet, Rfl: 0   loratadine (CLARITIN) 10 MG tablet, Take 1 tablet (10 mg total) by mouth daily., Disp: 30 tablet, Rfl: 5   Multiple Vitamin (MULTIVITAMIN) tablet, Take 1 tablet by mouth daily., Disp: , Rfl:    PHENobarbital (LUMINAL) 64.8 MG tablet, Take 1 tablet by mouth twice daily, Disp: 180 tablet, Rfl: 1   SF 5000 PLUS 1.1 % CREA dental cream, BRUSH ON TEETH TWICE DAILY. NO EATING OR DRINKING FOR 30 MINUTES AFTER USE, Disp: 51 g, Rfl: 0   valACYclovir (VALTREX) 1000 MG tablet, TAKE 1 TABLET BY MOUTH TWICE DAILY AS NEEDED FOR  OUTBREAK, Disp: 30 tablet, Rfl: 0  No Known  Allergies  I personally reviewed active problem list, medication list, allergies, family history, social history, health maintenance with the patient/caregiver today.   ROS  Constitutional: Negative for fever or weight change.  Respiratory: Negative for cough and shortness of breath.   Cardiovascular: Negative for chest pain or palpitations.  Gastrointestinal: Negative for abdominal pain, no bowel changes.  Musculoskeletal: Negative for gait problem or joint swelling.  Skin: Negative for rash.  Neurological: Negative for dizziness or headache.  No other specific complaints in a complete review of systems (except as listed in HPI above).  Objective  Vitals:    06/09/20 1043  BP: 100/70  Pulse: 93  Resp: 16  Temp: (!) 97.3 F (36.3 C)  TempSrc: Temporal  SpO2: 98%  Weight: 174 lb 4.8 oz (79.1 kg)  Height: 5\' 10"  (1.778 m)    Body mass index is 25.01 kg/m.  Physical Exam  Constitutional: Patient appears well-developed and well-nourished.  No distress.  HEENT: head atraumatic, normocephalic, pupils equal and reactive to light,neck supple, history of clef repair  Cardiovascular: Normal rate, regular rhythm and normal heart sounds.  No murmur heard. No BLE edema. Pulmonary/Chest: Effort normal and breath sounds normal. No respiratory distress. Abdominal: Soft.  There is no tenderness. Inguinal hernia on right side, mother would like to establish with surgeon, right testicle in inguinal canal, but reducible   Psychiatric: Patient has a normal mood and affect. behavior is normal. Judgment and thought content normal.  Recent Results (from the past 2160 hour(s))  Cologuard     Status: None   Collection Time: 03/18/20 12:00 AM  Result Value Ref Range   Cologuard Negative Negative     PHQ2/9: Depression screen [facility] 2/9 06/09/2020 04/08/2020 12/10/2019 02/28/2018 09/27/2017  Decreased Interest 0 0 0 - 0  Down, Depressed, Hopeless 0 0 0 - 0  PHQ - 2 Score 0 0 0 - 0  Altered sleeping 0 - 0 - -  Tired, decreased energy 0 - 0 - -  Change in appetite 0 - 0 - -  Feeling bad or failure about yourself  0 - 0 - -  Trouble concentrating 0 - 0 - -  Moving slowly or fidgety/restless 0 - 0 - -  Suicidal thoughts 0 - 0 - -  PHQ-9 Score 0 - 0 - -  Difficult doing work/chores - - Not difficult at all Not difficult at all -    phq 9 is negative   Fall Risk: Fall Risk  06/09/2020 04/08/2020 12/10/2019 05/21/2019 04/05/2019  Falls in the past year? 0 0 0 0 0  Number falls in past yr: 0 0 0 0 0  Injury with Fall? 0 0 0 0 0  Risk for fall due to : - No Fall Risks - - -  Risk for fall due to: Comment - - - - -  Follow up - Falls prevention discussed - - Falls  prevention discussed     Functional Status Survey: Is the patient deaf or have difficulty hearing?: No Does the patient have difficulty seeing, even when wearing glasses/contacts?: No Does the patient have difficulty concentrating, remembering, or making decisions?: Yes Does the patient have difficulty walking or climbing stairs?: No Does the patient have difficulty dressing or bathing?: No Does the patient have difficulty doing errands alone such as visiting a doctor's office or shopping?: No   Assessment & Plan  1. Cold sore  - valACYclovir (VALTREX) 1000 MG tablet; TAKE 1 TABLET BY MOUTH TWICE DAILY AS  NEEDED FOR  OUTBREAK  Dispense: 30 tablet; Refill: 0  2. Dyslipidemia  Reviewed a healthy diet with mother and patient   3. Intellectual disability with epilepsy ([place])  - carbamazepine (TEGRETOL) 200 MG tablet; Take 1 tablet (200 mg total) by mouth 4 (four) times daily. 2 in am and one at lunch and one at night  Dispense: 360 tablet; Refill: 1 - PHENobarbital (LUMINAL) 64.8 MG tablet; Take 1 tablet (64.8 mg total) by mouth 2 (two) times daily.  Dispense: 180 tablet; Refill: 1  4. Vitamin D deficiency  Continue supplementation   5. Unilateral recurrent inguinal hernia without obstruction or gangrene  Mother would like for him to see surgeon, discussed incarceration at this time - Ambulatory referral to General Surgery.  6. Need for shingles vaccine  - Varicella-zoster vaccine IM

## 2020-06-17 ENCOUNTER — Other Ambulatory Visit: Payer: Self-pay

## 2020-06-17 ENCOUNTER — Encounter: Payer: Self-pay | Admitting: General Surgery

## 2020-06-17 ENCOUNTER — Ambulatory Visit (INDEPENDENT_AMBULATORY_CARE_PROVIDER_SITE_OTHER): Payer: Medicare HMO | Admitting: General Surgery

## 2020-06-17 VITALS — BP 118/77 | HR 80 | Temp 97.5°F | Ht 67.0 in | Wt 173.4 lb

## 2020-06-17 DIAGNOSIS — K4011 Bilateral inguinal hernia, with gangrene, recurrent: Secondary | ICD-10-CM | POA: Insufficient documentation

## 2020-06-17 NOTE — Progress Notes (Signed)
Patient ID: Alec Campbell, male   DOB: 29-Apr-1971, 49 y.o.   MRN: [id number]  Chief Complaint  Patient presents with  . New Patient (Initial Visit)     new pt ref Dr.Sowles inguinal hernia    HPI Alec Campbell is a 49 y.o. male.   He was referred by his primary care provider, Dr. Ancil Boozer, for evaluation of a right inguinal hernia.  He is accompanied today by his mother who provides the history, as Mr. Bomkamp has significant cognitive delays.  She says that as an infant, he had bilateral inguinal hernias repaired.  She has noticed a right sided inguinal bulge for some time, however over about the past 6 months, she feels like it may have increased in size.  Both she and the patient deny any nausea, vomiting, fevers, or chills.  No difficulty with urination.  He is chronically constipated and currently takes a stool softener daily.  His mother reports that as a child, he was on an orange-colored liquid medication for constipation, but it usually gave him diarrhea.  She does not recall the name.  Alec Campbell denies any pain associated with the hernia and his mother confirms this.  They state that Dr. Ancil Boozer referred him primarily for reassurance and to essentially have him on a surgeon's radar screen, should any symptoms develop.   Past Medical History:  Diagnosis Date  . Allergy   . Moderate mental retardation   . Seizures ([place])   . Vitamin D deficiency     Past Surgical History:  Procedure Laterality Date  . ANKLE SURGERY Right   . CLEFT LIP REPAIR    . HERNIA REPAIR     Inguinal  . TYMPANOSTOMY TUBE PLACEMENT      Family History  Problem Relation Age of Onset  . Hypertension Mother   . Thyroid disease Mother   . CAD Father   . Hypertension Father   . Hyperlipidemia Father   . Healthy Brother   . Healthy Sister   . Diabetes Brother   . COPD Brother   . Emphysema Brother   . Alcohol abuse Brother   . Hypertension Brother   . Aneurysm Brother        brain    Social  History Social History   Tobacco Use  . Smoking status: Never Smoker  . Smokeless tobacco: Never Used  . Tobacco comment: smoking cessation materials not required  Vaping Use  . Vaping Use: Never used  Substance Use Topics  . Alcohol use: No    Alcohol/week: 0.0 standard drinks  . Drug use: No    No Known Allergies  Current Outpatient Medications  Medication Sig Dispense Refill  . carbamazepine (TEGRETOL) 200 MG tablet Take 1 tablet (200 mg total) by mouth 4 (four) times daily. 2 in am and one at lunch and one at night 360 tablet 1  . loratadine (CLARITIN) 10 MG tablet Take 1 tablet (10 mg total) by mouth daily. 30 tablet 5  . Multiple Vitamin (MULTIVITAMIN) tablet Take 1 tablet by mouth daily.    Marland Kitchen PHENobarbital (LUMINAL) 64.8 MG tablet Take 1 tablet (64.8 mg total) by mouth 2 (two) times daily. 180 tablet 1  . SF 5000 PLUS 1.1 % CREA dental cream BRUSH ON TEETH TWICE DAILY. NO EATING OR DRINKING FOR 30 MINUTES AFTER USE 51 g 0  . valACYclovir (VALTREX) 1000 MG tablet TAKE 1 TABLET BY MOUTH TWICE DAILY AS NEEDED FOR  OUTBREAK 30 tablet 0   No current  facility-administered medications for this visit.    Review of Systems Review of Systems  Unable to perform ROS: Other  Developmental disability.  Blood pressure 118/77, pulse 80, temperature (!) 97.5 F (36.4 C), temperature source Oral, height 5\' 7"  (1.702 m), weight 173 lb 6.4 oz (78.7 kg), SpO2 98 %. Today's Vitals   06/17/20 1359  BP: 118/77  Pulse: 80  Temp: (!) 97.5 F (36.4 C)  TempSrc: Oral  SpO2: 98%  Weight: 173 lb 6.4 oz (78.7 kg)  Height: 5\' 7"  (1.702 m)  PainSc: 0-No pain  PainLoc: Scrotum   Body mass index is 27.16 kg/m.  Physical Exam Physical Exam Vitals reviewed. Exam conducted with a chaperone present.  Constitutional:      General: He is not in acute distress.    Appearance: Normal appearance. He is normal weight.  HENT:     Head: Normocephalic and atraumatic.     Nose:     Comments:  Covered with a mask    Mouth/Throat:     Comments: Covered with a mask Eyes:     General: No scleral icterus.       Right eye: No discharge.        Left eye: No discharge.     Conjunctiva/sclera: Conjunctivae normal.  Neck:     Comments: The trachea is midline.  There is no palpable cervical or supraclavicular lymphadenopathy.  No thyromegaly or dominant thyroid masses appreciated.  The gland moves freely with deglutition. Cardiovascular:     Rate and Rhythm: Normal rate.     Pulses: Normal pulses.  Pulmonary:     Effort: Pulmonary effort is normal.     Breath sounds: Normal breath sounds.  Abdominal:     General: Bowel sounds are normal.     Palpations: Abdomen is soft.  Genitourinary:      Comments: There is a transverse suprapubic scar that the patient's mother states is from his prior hernia repairs as a baby.  There is a modest sized right inguinal hernia as well as a tiny left inguinal hernia.  Neither is painful in both reduce easily. Musculoskeletal:        General: No swelling or tenderness.  Skin:    General: Skin is warm and dry.  Neurological:     Mental Status: He is alert. Mental status is at baseline.  Psychiatric:        Behavior: Behavior normal.     Data Reviewed I reviewed Dr. Ancil Boozer' clinic note from June 09, 2020.  She describes a reducible right inguinal hernia.  Assessment This is a 49 year old man with a history of prior bilateral inguinal hernia repairs as an infant.  He has recurrent bilateral inguinal hernias.  The right is significantly larger than the left.  He is completely asymptomatic from either hernia at this time.  Both he and his mother expressed a strong desire to avoid surgery.  Plan As there are no symptoms and no signs concerning for incarceration or strangulation, I agree that these hernias can simply be monitored.  We provided the family with information regarding warning signs to be aware of.  I also recommended that he start taking  MiraLAX on a daily basis to help prevent constipation.  If his stools become loose, they should simply skip MiraLAX for a day or 2 and then resume it.  I have not made a scheduled follow-up visit at this time, but should symptoms develop, we will certainly see him back expeditiously.  Fredirick Maudlin 06/17/2020, 4:37 PM

## 2020-06-17 NOTE — Patient Instructions (Addendum)
Dr.Cannon recommends patient to try over the counter Miralax along with taking Stool Softener daily.  Patient's mother advised to give our office if symptoms worsen.   Inguinal Hernia, Adult An inguinal hernia is when fat or your intestines push through a weak spot in a muscle where your leg meets your lower belly (groin). This causes a rounded lump (bulge). This kind of hernia could also be:  In your scrotum, if you are male.  In folds of skin around your vagina, if you are male. There are three types of inguinal hernias. These include:  Hernias that can be pushed back into the belly (are reducible). This type rarely causes pain.  Hernias that cannot be pushed back into the belly (are incarcerated).  Hernias that cannot be pushed back into the belly and lose their blood supply (are strangulated). This type needs emergency surgery. If you do not have symptoms, you may not need treatment. If you have symptoms or a large hernia, you may need surgery. Follow these instructions at home: Lifestyle  Do these things if told by your doctor so you do not have trouble pooping (constipation): ? Drink enough fluid to keep your pee (urine) pale yellow. ? Eat foods that have a lot of fiber. These include fresh fruits and vegetables, whole grains, and beans. ? Limit foods that are high in fat and processed sugars. These include foods that are fried or sweet. ? Take medicine for trouble pooping.  Avoid lifting heavy objects.  Avoid standing for long amounts of time.  Do not use any products that contain nicotine or tobacco. These include cigarettes and e-cigarettes. If you need help quitting, ask your doctor.  Stay at a healthy weight. General instructions  You may try to push your hernia in by very gently pressing on it when you are lying down. Do not try to force the bulge back in if it will not push in easily.  Watch your hernia for any changes in shape, size, or color. Tell your doctor if  you see any changes.  Take over-the-counter and prescription medicines only as told by your doctor.  Keep all follow-up visits as told by your doctor. This is important. Contact a doctor if:  You have a fever.  You have new symptoms.  Your symptoms get worse. Get help right away if:  The area where your leg meets your lower belly has: ? Pain that gets worse suddenly. ? A bulge that gets bigger suddenly, and it does not get smaller after that. ? A bulge that turns red or purple. ? A bulge that is painful when you touch it.  You are a man, and your scrotum: ? Suddenly feels painful. ? Suddenly changes in size.  You cannot push the hernia in by very gently pressing on it when you are lying down. Do not try to force the bulge back in if it will not push in easily.  You feel sick to your stomach (nauseous), and that feeling does not go away.  You throw up (vomit), and that keeps happening.  You have a fast heartbeat.  You cannot poop (have a bowel movement) or pass gas. These symptoms may be an emergency. Do not wait to see if the symptoms will go away. Get medical help right away. Call your local emergency services (911 in the [place] Summary  An inguinal hernia is when fat or your intestines push through a weak spot in a muscle where your leg meets your lower belly (  groin). This causes a rounded lump (bulge).  If you do not have symptoms, you may not need treatment. If you have symptoms or a large hernia, you may need surgery.  Avoid lifting heavy objects. Also avoid standing for long amounts of time.  Do not try to force the bulge back in if it will not push in easily. This information is not intended to replace advice given to you by your health care provider. Make sure you discuss any questions you have with your health care provider. Document Revised: 11/19/2017 Document Reviewed: 07/20/2017 Elsevier Patient Education  [place].  Polyethylene Glycol  powder What is this medicine? POLYETHYLENE GLYCOL 3350 (pol ee ETH i leen; GLYE col) powder is a laxative used to treat constipation. It increases the amount of water in the stool. Bowel movements become easier and more frequent. This medicine may be used for other purposes; ask your health care provider or pharmacist if you have questions. COMMON BRAND NAME(S): Sharlyn Bologna, GlycoLax, Healthylax, MiraLax, Smooth LAX, Vita Health What should I tell my health care provider before I take this medicine? They need to know if you have any of these conditions:  a history of blockage of the stomach or intestine  current abdomen distension or pain  difficulty swallowing  diverticulitis, ulcerative colitis, or other chronic bowel disease  phenylketonuria  an unusual or allergic reaction to polyethylene glycol, other medicines, dyes, or preservatives  pregnant or trying to get pregnant  breast-feeding How should I use this medicine? Take this medicine by mouth. The bottle has a measuring cap that is marked with a line. Pour the powder into the cap up to the marked line (the dose is about 1 heaping tablespoon). Add the powder in the cap to a full glass (4 to 8 ounces or 120 to 240 mL) of water, juice, soda, coffee or tea. Mix the powder well. Ensure that the powder is fully dissolved. Do not drink if there are any clumps. Drink the solution. Take exactly as directed. Do not take your medicine more often than directed. Talk to your pediatrician regarding the use of this medicine in children. Special care may be needed. Overdosage: If you think you have taken too much of this medicine contact a poison control center or emergency room at once. NOTE: This medicine is only for you. Do not share this medicine with others. What if I miss a dose? If you miss a dose, take it as soon as you can. If it is almost time for your next dose, take only that dose. Do not take double or extra doses. What may  interact with this medicine? Interactions are not expected. This list may not describe all possible interactions. Give your health care provider a list of all the medicines, herbs, non-prescription drugs, or dietary supplements you use. Also tell them if you smoke, drink alcohol, or use illegal drugs. Some items may interact with your medicine. What should I watch for while using this medicine? Do not use for more than 2 weeks without advice from your doctor or health care professional. It can take 2 to 4 days to have a bowel movement and to experience improvement in constipation. See your health care professional for any changes in bowel habits, including constipation, that are severe or last longer than three weeks. Always take this medicine with plenty of water. What side effects may I notice from receiving this medicine? Side effects that you should report to your doctor or health care  professional as soon as possible:  diarrhea  difficulty breathing  itching of the skin, hives, or skin rash  severe bloating, pain, or distension of the stomach  vomiting Side effects that usually do not require medical attention (report to your doctor or health care professional if they continue or are bothersome):  bloating or gas  lower abdominal discomfort or cramps  nausea This list may not describe all possible side effects. Call your doctor for medical advice about side effects. You may report side effects to FDA at 1-800-FDA-1088. Where should I keep my medicine? Keep out of the reach of children. Store between 15 and 30 degrees C (59 and 86 degrees F). Throw away any unused medicine after the expiration date. NOTE: This sheet is a summary. It may not cover all possible information. If you have questions about this medicine, talk to your doctor, pharmacist, or health care provider.  2020 Elsevier/Gold Standard (2018-04-06 10:42:01)

## 2020-08-11 ENCOUNTER — Ambulatory Visit (INDEPENDENT_AMBULATORY_CARE_PROVIDER_SITE_OTHER): Payer: Medicare HMO

## 2020-08-11 ENCOUNTER — Other Ambulatory Visit: Payer: Self-pay

## 2020-08-11 DIAGNOSIS — Z23 Encounter for immunization: Secondary | ICD-10-CM | POA: Diagnosis not present

## 2020-08-20 ENCOUNTER — Ambulatory Visit: Payer: Medicare HMO | Admitting: Family Medicine

## 2020-12-08 NOTE — Progress Notes (Signed)
Name: Alec Campbell   MRN: [id number]    DOB: 10-03-1971   Date:12/10/2020       Progress Note  Subjective  Chief Complaint  Follow up   HPI   Hyponatremia and hypokalemia:last levels were normal , we will recheck it today   Intellectual disability with epilepsy:he lives with his parents, parents dispense his medications he also needs assistance with transportation, he is unable to manage his finances. Parents also need to be present during his office visits. He developed seizures at 28 weeksof age ,he was born with cleft lips and palate. He was diagnosed with developmental delay as a toddler , he has been stable. Able to do ADL   Seizure disorder: no symptoms in many years, mother is terrified of stopping his medication because his symptoms were severe. He has a history of hyponatremia, he saw nephrologist for a period of time, but last sodium was back to normal.  He is now on only two pill of phenobarbitaland 4 pills of carbamazepine daily without any side effects.Reviewed last labs, alkaline phosphatase a little elevated, we will recheck it today .  Dyslipidemia: not on medication, discussed life style modification, positive family history of heart disease, but he denies any decrease of exercise tolerance   Inguinal hernia: he had an inguinal hernia repair on right side, mother has noticed it getting larger, does not seem to be cause any problems at this time, we gave reassurance again, monitor for encarceration  Excoriation right wrist: he had to remove his watch, he is due for Tdap  Cold sore: no recent outbreaks, he has Valtrex at home  Patient Active Problem List   Diagnosis Date Noted  . Inguinal hernia with gangrene, recurrent bilateral 06/17/2020  . Cold sore 02/13/2016  . Vitamin D deficiency 07/20/2015  . Intellectual disability with epilepsy ([place]) 07/16/2015  . Seizure disorder ([place]) 07/16/2015  . Seasonal allergic rhinitis 07/16/2015  . Dyslipidemia 07/16/2015   . Hyponatremia 06/13/2015  . Abnormal blood sugar 06/19/2008    Past Surgical History:  Procedure Laterality Date  . ANKLE SURGERY Right   . CLEFT LIP REPAIR    . HERNIA REPAIR     Inguinal  . TYMPANOSTOMY TUBE PLACEMENT      Family History  Problem Relation Age of Onset  . Hypertension Mother   . Thyroid disease Mother   . CAD Father   . Hypertension Father   . Hyperlipidemia Father   . Healthy Brother   . Healthy Sister   . Diabetes Brother   . COPD Brother   . Emphysema Brother   . Alcohol abuse Brother   . Hypertension Brother   . Aneurysm Brother        brain    Social History   Tobacco Use  . Smoking status: Never Smoker  . Smokeless tobacco: Never Used  . Tobacco comment: smoking cessation materials not required  Substance Use Topics  . Alcohol use: No    Alcohol/week: 0.0 standard drinks     Current Outpatient Medications:  .  carbamazepine (TEGRETOL) 200 MG tablet, Take 1 tablet (200 mg total) by mouth 4 (four) times daily. 2 in am and one at lunch and one at night, Disp: 360 tablet, Rfl: 1 .  loratadine (CLARITIN) 10 MG tablet, Take 1 tablet (10 mg total) by mouth daily., Disp: 30 tablet, Rfl: 5 .  Multiple Vitamin (MULTIVITAMIN) tablet, Take 1 tablet by mouth daily., Disp: , Rfl:  .  PHENobarbital (LUMINAL) 64.8 MG tablet,  Take 1 tablet (64.8 mg total) by mouth 2 (two) times daily., Disp: 180 tablet, Rfl: 1 .  PREVIDENT 5000 SENSITIVE 1.1-5 % GEL, Place onto teeth 2 (two) times daily., Disp: , Rfl:  .  SF 5000 PLUS 1.1 % CREA dental cream, BRUSH ON TEETH TWICE DAILY. NO EATING OR DRINKING FOR 30 MINUTES AFTER USE, Disp: 51 g, Rfl: 0 .  valACYclovir (VALTREX) 1000 MG tablet, TAKE 1 TABLET BY MOUTH TWICE DAILY AS NEEDED FOR  OUTBREAK, Disp: 30 tablet, Rfl: 0  No Known Allergies  I personally reviewed active problem list, medication list, allergies, family history, social history, health maintenance with the patient/caregiver  today.   ROS  Constitutional: Negative for fever or weight change.  Respiratory: Negative for cough and shortness of breath.   Cardiovascular: Negative for chest pain or palpitations.  Gastrointestinal: Negative for abdominal pain, no bowel changes.  Musculoskeletal: Negative for gait problem or joint swelling.  Skin: Negative for rash. Excoriation of right wrist  Neurological: Negative for dizziness or headache.  No other specific complaints in a complete review of systems (except as listed in HPI above).  Objective  Vitals:   12/10/20 0859  BP: 116/72  Pulse: 93  Resp: 18  Temp: 97.9 F (36.6 C)  TempSrc: Oral  SpO2: 99%  Weight: 169 lb 9.6 oz (76.9 kg)  Height: 5\' 7"  (1.702 m)    Body mass index is 26.56 kg/m.  Physical Exam  Constitutional: Patient appears well-developed and well-nourished.No distress.  HEENT: head atraumatic, normocephalic, pupils equal and reactive to light, neck supple Cardiovascular: Normal rate, regular rhythm and normal heart sounds.  No murmur heard. No BLE edema. Pulmonary/Chest: Effort normal and breath sounds normal. No respiratory distress. Abdominal: Soft.  There is no tenderness. Skin: excoriation on right arm, no signs of infection  Psychiatric: Patient has a normal mood and affect. behavior is normal. Judgment and thought content normal.  PHQ2/9: Depression screen [facility] 2/9 12/10/2020 06/09/2020 04/08/2020 12/10/2019 02/28/2018  Decreased Interest 0 0 0 0 -  Down, Depressed, Hopeless 0 0 0 0 -  PHQ - 2 Score 0 0 0 0 -  Altered sleeping - 0 - 0 -  Tired, decreased energy - 0 - 0 -  Change in appetite - 0 - 0 -  Feeling bad or failure about yourself  - 0 - 0 -  Trouble concentrating - 0 - 0 -  Moving slowly or fidgety/restless - 0 - 0 -  Suicidal thoughts - 0 - 0 -  PHQ-9 Score - 0 - 0 -  Difficult doing work/chores - - - Not difficult at all Not difficult at all    phq 9 is negative   Fall Risk: Fall Risk  12/10/2020 06/17/2020  06/09/2020 04/08/2020 12/10/2019  Falls in the past year? 0 0 0 0 0  Number falls in past yr: 0 0 0 0 0  Injury with Fall? 0 0 0 0 0  Risk for fall due to : - - - No Fall Risks -  Risk for fall due to: Comment - - - - -  Follow up - - - Falls prevention discussed -    Functional Status Survey: Is the patient deaf or have difficulty hearing?: No Does the patient have difficulty seeing, even when wearing glasses/contacts?: No Does the patient have difficulty concentrating, remembering, or making decisions?: No Does the patient have difficulty walking or climbing stairs?: No Does the patient have difficulty dressing or bathing?: No Does the  patient have difficulty doing errands alone such as visiting a doctor's office or shopping?: Yes   Assessment & Plan  1. Dyslipidemia  - Lipid panel  2. Seizure disorder (HCC)  - Phenobarbital level - Carbamazepine Level (Tegretol), total  3. Vitamin D deficiency  - VITAMIN D 25 Hydroxy (Vit-D Deficiency, Fractures)  4. Hyponatremia  Recheck labs  5. History of anemia  - CBC with Differential/Platelet  6. Hypokalemia  - COMPLETE METABOLIC PANEL WITH GFR  7. Long-term use of high-risk medication  - Phenobarbital level - Carbamazepine Level (Tegretol), total - COMPLETE METABOLIC PANEL WITH GFR - CBC with Differential/Platelet  8. Seasonal allergic rhinitis, unspecified trigger   9. Wrist abrasion, non-infected  - Tdap vaccine greater than or equal to 7yo IM First lesion about one week ago   10. Need for Tdap vaccination  - Tdap vaccine greater than or equal to 7yo IM

## 2020-12-10 ENCOUNTER — Encounter: Payer: Self-pay | Admitting: Family Medicine

## 2020-12-10 ENCOUNTER — Ambulatory Visit (INDEPENDENT_AMBULATORY_CARE_PROVIDER_SITE_OTHER): Payer: Medicare Other | Admitting: Family Medicine

## 2020-12-10 ENCOUNTER — Other Ambulatory Visit: Payer: Self-pay

## 2020-12-10 VITALS — BP 116/72 | HR 93 | Temp 97.9°F | Resp 18 | Ht 67.0 in | Wt 169.6 lb

## 2020-12-10 DIAGNOSIS — Z862 Personal history of diseases of the blood and blood-forming organs and certain disorders involving the immune mechanism: Secondary | ICD-10-CM

## 2020-12-10 DIAGNOSIS — E785 Hyperlipidemia, unspecified: Secondary | ICD-10-CM

## 2020-12-10 DIAGNOSIS — E559 Vitamin D deficiency, unspecified: Secondary | ICD-10-CM | POA: Diagnosis not present

## 2020-12-10 DIAGNOSIS — S60819A Abrasion of unspecified wrist, initial encounter: Secondary | ICD-10-CM

## 2020-12-10 DIAGNOSIS — Z23 Encounter for immunization: Secondary | ICD-10-CM

## 2020-12-10 DIAGNOSIS — Z1159 Encounter for screening for other viral diseases: Secondary | ICD-10-CM

## 2020-12-10 DIAGNOSIS — E876 Hypokalemia: Secondary | ICD-10-CM

## 2020-12-10 DIAGNOSIS — J302 Other seasonal allergic rhinitis: Secondary | ICD-10-CM

## 2020-12-10 DIAGNOSIS — E871 Hypo-osmolality and hyponatremia: Secondary | ICD-10-CM

## 2020-12-10 DIAGNOSIS — G40909 Epilepsy, unspecified, not intractable, without status epilepticus: Secondary | ICD-10-CM | POA: Diagnosis not present

## 2020-12-10 DIAGNOSIS — Z79899 Other long term (current) drug therapy: Secondary | ICD-10-CM

## 2020-12-12 ENCOUNTER — Other Ambulatory Visit: Payer: Self-pay | Admitting: Family Medicine

## 2020-12-12 DIAGNOSIS — E871 Hypo-osmolality and hyponatremia: Secondary | ICD-10-CM

## 2020-12-12 LAB — CBC WITH DIFFERENTIAL/PLATELET
Absolute Monocytes: 527 cells/uL (ref 200–950)
Basophils Absolute: 57 cells/uL (ref 0–200)
Basophils Relative: 0.7 %
Eosinophils Absolute: 543 cells/uL — ABNORMAL HIGH (ref 15–500)
Eosinophils Relative: 6.7 %
HCT: 40.2 % (ref 38.5–50.0)
Hemoglobin: 13.9 g/dL (ref 13.2–17.1)
Lymphs Abs: 2616 cells/uL (ref 850–3900)
MCH: 30.2 pg (ref 27.0–33.0)
MCHC: 34.6 g/dL (ref 32.0–36.0)
MCV: 87.4 fL (ref 80.0–100.0)
MPV: 12.8 fL — ABNORMAL HIGH (ref 7.5–12.5)
Monocytes Relative: 6.5 %
Neutro Abs: 4358 cells/uL (ref 1500–7800)
Neutrophils Relative %: 53.8 %
Platelets: 193 10*3/uL (ref 140–400)
RBC: 4.6 10*6/uL (ref 4.20–5.80)
RDW: 12.4 % (ref 11.0–15.0)
Total Lymphocyte: 32.3 %
WBC: 8.1 10*3/uL (ref 3.8–10.8)

## 2020-12-12 LAB — COMPLETE METABOLIC PANEL WITH GFR
AG Ratio: 2 (calc) (ref 1.0–2.5)
ALT: 13 U/L (ref 9–46)
AST: 16 U/L (ref 10–35)
Albumin: 4.8 g/dL (ref 3.6–5.1)
Alkaline phosphatase (APISO): 134 U/L (ref 35–144)
BUN: 10 mg/dL (ref 7–25)
CO2: 30 mmol/L (ref 20–32)
Calcium: 10.4 mg/dL — ABNORMAL HIGH (ref 8.6–10.3)
Chloride: 95 mmol/L — ABNORMAL LOW (ref 98–110)
Creat: 0.94 mg/dL (ref 0.70–1.33)
GFR, Est African American: 109 mL/min/{1.73_m2} (ref >=60)
GFR, Est Non African American: 94 mL/min/{1.73_m2} (ref >=60)
Globulin: 2.4 g/dL (calc) (ref 1.9–3.7)
Glucose, Bld: 81 mg/dL (ref 65–99)
Potassium: 4.6 mmol/L (ref 3.5–5.3)
Sodium: 131 mmol/L — ABNORMAL LOW (ref 135–146)
Total Bilirubin: 0.3 mg/dL (ref 0.2–1.2)
Total Protein: 7.2 g/dL (ref 6.1–8.1)

## 2020-12-12 LAB — LIPID PANEL
Cholesterol: 225 mg/dL — ABNORMAL HIGH (ref <200)
HDL: 42 mg/dL (ref >=40)
LDL Cholesterol (Calc): 151 mg/dL (calc) — ABNORMAL HIGH
Non-HDL Cholesterol (Calc): 183 mg/dL (calc) — ABNORMAL HIGH (ref <130)
Total CHOL/HDL Ratio: 5.4 (calc) — ABNORMAL HIGH (ref <5.0)
Triglycerides: 184 mg/dL — ABNORMAL HIGH (ref <150)

## 2020-12-12 LAB — VITAMIN D 25 HYDROXY (VIT D DEFICIENCY, FRACTURES): Vit D, 25-Hydroxy: 47 ng/mL (ref 30–100)

## 2020-12-12 LAB — PHENOBARBITAL LEVEL: Phenobarbital, Serum: 25.6 mg/L (ref 15.0–40.0)

## 2020-12-12 LAB — CARBAMAZEPINE LEVEL, TOTAL: Carbamazepine Lvl: 7.4 mg/L (ref 4.0–12.0)

## 2020-12-20 ENCOUNTER — Other Ambulatory Visit: Payer: Self-pay | Admitting: Family Medicine

## 2020-12-20 DIAGNOSIS — F79 Unspecified intellectual disabilities: Secondary | ICD-10-CM

## 2020-12-20 NOTE — Telephone Encounter (Signed)
Requested medication (s) are due for refill today: Yes  Requested medication (s) are on the active medication list: Yes  Last refill:  06/09/20  Future visit scheduled: Yes  Notes to clinic:  Unable to refill per protocol, cannot delegate.      Requested Prescriptions  Pending Prescriptions Disp Refills   PHENobarbital (LUMINAL) 64.8 MG tablet [Pharmacy Med Name: PHENobarbital 64.8 MG Oral Tablet] 180 tablet 0    Sig: Take 1 tablet by mouth twice daily      Not Delegated - Neurology: Anticonvulsants - Controlled - phenobarbital Failed - 12/20/2020  1:03 PM      Failed - This refill cannot be delegated      Passed - Phenobarbital in normal range and within 360 days    Phenobarbital, Serum  Date Value Ref Range Status  12/10/2020 25.6 15.0 - 40.0 mg/L Final          Passed - Valid encounter within last 12 months    Recent Outpatient Visits           1 week ago Dyslipidemia   [facility] [place], Drue Stager, MD   6 months ago Intellectual disability with epilepsy [facility])   [facility] [place], Drue Stager, MD   1 year ago Hyponatremia   [facility] [place], Drue Stager, MD   1 year ago Leukocytosis, unspecified type   [facility] [name], MD   1 year ago Urinary tract infection without hematuria, site unspecified   [facility] [name], MD       Future Appointments             In 3 months  [facility], [place]   In 5 months [name], MD [facility], [facility]               carbamazepine (TEGRETOL) 200 MG tablet [Pharmacy Med Name: carBAMazepine 200 MG Oral Tablet] 360 tablet 0    Sig: TAKE  TWO  TABLETS BY MOUTH IN  THE  MORNING , ONE  AT  LUNCH  AND  ONE  AT  NIGHT      Not Delegated - Neurology:  Anticonvulsants - carbamazepine Failed - 12/20/2020  1:03 PM      Failed - This refill cannot be delegated      Failed - Na in  normal range and within 90 days    Sodium  Date Value Ref Range Status  12/10/2020 131 (L) 135 - 146 mmol/L Final  07/27/2016 133 (L) 134 - 144 mmol/L Final          Passed - AST in normal range and within 90 days    AST  Date Value Ref Range Status  12/10/2020 16 10 - 35 U/L Final          Passed - ALT in normal range and within 90 days    ALT  Date Value Ref Range Status  12/10/2020 13 9 - 46 U/L Final          Passed - Carbamazepine (serum) in normal range and within 360 days    Carbamazepine (Tegretol), S  Date Value Ref Range Status  07/27/2016 8.8 4.0 - 12.0 ug/mL Final    Comment:             In conjunction with other antiepileptic drugs  Therapeutic  4.0 -  8.0                                Toxicity     9.0 - 12.0                                    Carbamazepine alone                                Therapeutic  8.0 - 12.0                                 Detection Limit =  2.0                           <2.0 indicated None Detected    Carbamazepine Lvl  Date Value Ref Range Status  12/10/2020 7.4 4.0 - 12.0 mg/L Final          Passed - WBC in normal range and within 90 days    WBC  Date Value Ref Range Status  12/10/2020 8.1 3.8 - 10.8 Thousand/uL Final          Passed - PLT in normal range and within 90 days    Platelets  Date Value Ref Range Status  12/10/2020 193 140 - 400 Thousand/uL Final  07/27/2016 183 150 - 379 x10E3/uL Final          Passed - HGB in normal range and within 90 days    Hemoglobin  Date Value Ref Range Status  12/10/2020 13.9 13.2 - 17.1 g/dL Final  07/27/2016 13.0 12.6 - 17.7 g/dL Final          Passed - HCT in normal range and within 90 days    HCT  Date Value Ref Range Status  12/10/2020 40.2 38.5 - 50.0 % Final   Hematocrit  Date Value Ref Range Status  07/27/2016 37.5 37.5 - 51.0 % Final          Passed - Valid encounter within last 12 months    Recent Outpatient Visits            1 week ago Dyslipidemia   [facility] [name], MD   6 months ago Intellectual disability with epilepsy [facility])   [facility] [name], MD   1 year ago Hyponatremia   [facility] [name], MD   1 year ago Leukocytosis, unspecified type   [facility] [name], MD   1 year ago Urinary tract infection without hematuria, site unspecified   [facility] [name], MD       Future Appointments             In 3 months  [facility], [place]   In 5 months [name], MD [facility], [facility]

## 2020-12-22 ENCOUNTER — Other Ambulatory Visit: Payer: Self-pay

## 2021-01-13 LAB — BASIC METABOLIC PANEL WITH GFR
BUN: 13 mg/dL (ref 7–25)
CO2: 30 mmol/L (ref 20–32)
Calcium: 10.1 mg/dL (ref 8.6–10.3)
Chloride: 99 mmol/L (ref 98–110)
Creat: 0.85 mg/dL (ref 0.70–1.33)
GFR, Est African American: 118 mL/min/{1.73_m2} (ref >=60)
GFR, Est Non African American: 102 mL/min/{1.73_m2} (ref >=60)
Glucose, Bld: 63 mg/dL — ABNORMAL LOW (ref 65–99)
Potassium: 4.2 mmol/L (ref 3.5–5.3)
Sodium: 136 mmol/L (ref 135–146)

## 2021-01-13 LAB — PARATHYROID HORMONE, INTACT (NO CA): PTH: 28 pg/mL (ref 16–77)

## 2021-03-31 ENCOUNTER — Telehealth: Payer: Self-pay

## 2021-03-31 NOTE — Telephone Encounter (Signed)
Copied from [place] [id number]. Topic: General - Other >> Mar 27, 2021  4:05 PM Alanda Slim E wrote: Reason for CRM: Pts mom called and stated they received a bill from [place] for his shingles vaccine/ pt was advised he would have no cost for this / pt called [employer] and wasn't able to get anyone and wanted to see if the office had any idea why [employer] would have denied payment/they received a bill for over $600/ please advise

## 2021-04-14 ENCOUNTER — Ambulatory Visit (INDEPENDENT_AMBULATORY_CARE_PROVIDER_SITE_OTHER): Payer: Medicare Other

## 2021-04-14 DIAGNOSIS — Z0000 Encounter for general adult medical examination without abnormal findings: Secondary | ICD-10-CM | POA: Diagnosis not present

## 2021-04-14 NOTE — Patient Instructions (Signed)
Mr. Alec Campbell , Thank you for taking time to come for your Medicare Wellness Visit. I appreciate your ongoing commitment to your health goals. Please review the following plan we discussed and let me know if I can assist you in the future.   Screening recommendations/referrals: Colonoscopy: Cologuard done 03/18/20; repeat in 2024 Recommended yearly ophthalmology/optometry visit for glaucoma screening and checkup Recommended yearly dental visit for hygiene and checkup  Vaccinations: Influenza vaccine: done 08/11/20 Pneumococcal vaccine: due Tdap vaccine: done 12/10/20 Shingles vaccine: done 06/09/20 & 08/11/20   Covid-19: done 12/05/19, 01/03/20, 09/02/20 & 01/18/21  Conditions/risks identified: Recommend increasing physical activity   Next appointment: Follow up in one year for your annual wellness visit   Preventive Care 40-64 Years, Male Preventive care refers to lifestyle choices and visits with your health care provider that can promote health and wellness. What does preventive care include? A yearly physical exam. This is also called an annual well check. Dental exams once or twice a year. Routine eye exams. Ask your health care provider how often you should have your eyes checked. Personal lifestyle choices, including: Daily care of your teeth and gums. Regular physical activity. Eating a healthy diet. Avoiding tobacco and drug use. Limiting alcohol use. Practicing safe sex. Taking low-dose aspirin every day starting at age 63. What happens during an annual well check? The services and screenings done by your health care provider during your annual well check will depend on your age, overall health, lifestyle risk factors, and family history of disease. Counseling  Your health care provider may ask you questions about your: Alcohol use. Tobacco use. Drug use. Emotional well-being. Home and relationship well-being. Sexual activity. Eating habits. Work and work  Statistician. Screening  You may have the following tests or measurements: Height, weight, and BMI. Blood pressure. Lipid and cholesterol levels. These may be checked every 5 years, or more frequently if you are over 41 years old. Skin check. Lung cancer screening. You may have this screening every year starting at age 53 if you have a 30-pack-year history of smoking and currently smoke or have quit within the past 15 years. Fecal occult blood test (FOBT) of the stool. You may have this test every year starting at age 64. Flexible sigmoidoscopy or colonoscopy. You may have a sigmoidoscopy every 5 years or a colonoscopy every 10 years starting at age 46. Prostate cancer screening. Recommendations will vary depending on your family history and other risks. Hepatitis C blood test. Hepatitis B blood test. Sexually transmitted disease (STD) testing. Diabetes screening. This is done by checking your blood sugar (glucose) after you have not eaten for a while (fasting). You may have this done every 1-3 years. Discuss your test results, treatment options, and if necessary, the need for more tests with your health care provider. Vaccines  Your health care provider may recommend certain vaccines, such as: Influenza vaccine. This is recommended every year. Tetanus, diphtheria, and acellular pertussis (Tdap, Td) vaccine. You may need a Td booster every 10 years. Zoster vaccine. You may need this after age 21. Pneumococcal 13-valent conjugate (PCV13) vaccine. You may need this if you have certain conditions and have not been vaccinated. Pneumococcal polysaccharide (PPSV23) vaccine. You may need one or two doses if you smoke cigarettes or if you have certain conditions. Talk to your health care provider about which screenings and vaccines you need and how often you need them. This information is not intended to replace advice given to you by your health care  provider. Make sure you discuss any questions you  have with your health care provider. Document Released: 11/14/2015 Document Revised: 07/07/2016 Document Reviewed: 08/19/2015 Elsevier Interactive Patient Education  2017 [place] Prevention in the Home Falls can cause injuries. They can happen to people of all ages. There are many things you can do to make your home safe and to help prevent falls. What can I do on the outside of my home? Regularly fix the edges of walkways and driveways and fix any cracks. Remove anything that might make you trip as you walk through a door, such as a raised step or threshold. Trim any bushes or trees on the path to your home. Use bright outdoor lighting. Clear any walking paths of anything that might make someone trip, such as rocks or tools. Regularly check to see if handrails are loose or broken. Make sure that both sides of any steps have handrails. Any raised decks and porches should have guardrails on the edges. Have any leaves, snow, or ice cleared regularly. Use sand or salt on walking paths during winter. Clean up any spills in your garage right away. This includes oil or grease spills. What can I do in the bathroom? Use night lights. Install grab bars by the toilet and in the tub and shower. Do not use towel bars as grab bars. Use non-skid mats or decals in the tub or shower. If you need to sit down in the shower, use a plastic, non-slip stool. Keep the floor dry. Clean up any water that spills on the floor as soon as it happens. Remove soap buildup in the tub or shower regularly. Attach bath mats securely with double-sided non-slip rug tape. Do not have throw rugs and other things on the floor that can make you trip. What can I do in the bedroom? Use night lights. Make sure that you have a light by your bed that is easy to reach. Do not use any sheets or blankets that are too big for your bed. They should not hang down onto the floor. Have a firm chair that has side arms. You can  use this for support while you get dressed. Do not have throw rugs and other things on the floor that can make you trip. What can I do in the kitchen? Clean up any spills right away. Avoid walking on wet floors. Keep items that you use a lot in easy-to-reach places. If you need to reach something above you, use a strong step stool that has a grab bar. Keep electrical cords out of the way. Do not use floor polish or wax that makes floors slippery. If you must use wax, use non-skid floor wax. Do not have throw rugs and other things on the floor that can make you trip. What can I do with my stairs? Do not leave any items on the stairs. Make sure that there are handrails on both sides of the stairs and use them. Fix handrails that are broken or loose. Make sure that handrails are as long as the stairways. Check any carpeting to make sure that it is firmly attached to the stairs. Fix any carpet that is loose or worn. Avoid having throw rugs at the top or bottom of the stairs. If you do have throw rugs, attach them to the floor with carpet tape. Make sure that you have a light switch at the top of the stairs and the bottom of the stairs. If you do not have  them, ask someone to add them for you. What else can I do to help prevent falls? Wear shoes that: Do not have high heels. Have rubber bottoms. Are comfortable and fit you well. Are closed at the toe. Do not wear sandals. If you use a stepladder: Make sure that it is fully opened. Do not climb a closed stepladder. Make sure that both sides of the stepladder are locked into place. Ask someone to hold it for you, if possible. Clearly mark and make sure that you can see: Any grab bars or handrails. First and last steps. Where the edge of each step is. Use tools that help you move around (mobility aids) if they are needed. These include: Canes. Walkers. Scooters. Crutches. Turn on the lights when you go into a dark area. Replace any light  bulbs as soon as they burn out. Set up your furniture so you have a clear path. Avoid moving your furniture around. If any of your floors are uneven, fix them. If there are any pets around you, be aware of where they are. Review your medicines with your doctor. Some medicines can make you feel dizzy. This can increase your chance of falling. Ask your doctor what other things that you can do to help prevent falls. This information is not intended to replace advice given to you by your health care provider. Make sure you discuss any questions you have with your health care provider. Document Released: 08/14/2009 Document Revised: 03/25/2016 Document Reviewed: 11/22/2014 Elsevier Interactive Patient Education  2017 [employer].

## 2021-04-14 NOTE — Progress Notes (Signed)
Subjective:   Alec Campbell is a 50 y.o. male who presents for Medicare Annual/Subsequent preventive examination.  Virtual Visit via Telephone Note  I connected with  Alec Campbell on 04/14/21 at 10:00 AM EDT by telephone and verified that I am speaking with the correct person using two identifiers.  Location: Patient: home Provider: [place] Persons participating in the virtual visit: patient & mother [name] Luella Cook Health Advisor   I discussed the limitations, risks, security and privacy concerns of performing an evaluation and management service by telephone and the availability of in person appointments. The patient expressed understanding and agreed to proceed.  Interactive audio and video telecommunications were attempted between this nurse and patient, however failed, due to patient having technical difficulties OR patient did not have access to video capability.  We continued and completed visit with audio only.  Some vital signs may be absent or patient reported.   Clemetine Marker, LPN   Review of Systems     Cardiac Risk Factors include: male gender;dyslipidemia     Objective:    There were no vitals filed for this visit. There is no height or weight on file to calculate BMI.  Advanced Directives 04/05/2019 02/28/2018 08/16/2017 04/21/2017 01/19/2017 07/21/2016 01/19/2016  Does Patient Have a Medical Advance Directive? No No No No No No No  Would patient like information on creating a medical advance directive? No - Guardian declined Yes (MAU/Ambulatory/Procedural Areas - Information given) - - - No - patient declined information -    Current Medications (verified) Outpatient Encounter Medications as of 04/14/2021  Medication Sig   carbamazepine (TEGRETOL) 200 MG tablet TAKE  TWO  TABLETS BY MOUTH IN  THE  MORNING , ONE  AT  LUNCH  AND  ONE  AT  NIGHT   loratadine (CLARITIN) 10 MG tablet Take 1 tablet (10 mg total) by mouth daily.   Multiple Vitamin (MULTIVITAMIN) tablet Take 1  tablet by mouth daily.   PHENobarbital (LUMINAL) 64.8 MG tablet Take 1 tablet by mouth twice daily   polyethylene glycol powder (GLYCOLAX/MIRALAX) 17 GM/SCOOP powder Take 17 g by mouth once. 1 scoop per day   SF 5000 PLUS 1.1 % CREA dental cream BRUSH ON TEETH TWICE DAILY. NO EATING OR DRINKING FOR 30 MINUTES AFTER USE   valACYclovir (VALTREX) 1000 MG tablet TAKE 1 TABLET BY MOUTH TWICE DAILY AS NEEDED FOR  OUTBREAK   [DISCONTINUED] PREVIDENT 5000 SENSITIVE 1.1-5 % GEL Place onto teeth 2 (two) times daily.   No facility-administered encounter medications on file as of 04/14/2021.    Allergies (verified) Patient has no known allergies.   History: Past Medical History:  Diagnosis Date   Allergy    Moderate mental retardation    Seizures ([place])    Vitamin D deficiency    Past Surgical History:  Procedure Laterality Date   ANKLE SURGERY Right    CLEFT LIP REPAIR     HERNIA REPAIR     Inguinal   TYMPANOSTOMY TUBE PLACEMENT     Family History  Problem Relation Age of Onset   Hypertension Mother    Thyroid disease Mother    CAD Father    Hypertension Father    Hyperlipidemia Father    Healthy Brother    Healthy Sister    Diabetes Brother    COPD Brother    Emphysema Brother    Alcohol abuse Brother    Hypertension Brother    Aneurysm Brother        brain  Social History   Socioeconomic History   Marital status: Single    Spouse name: Not on file   Number of children: 0   Years of education: Handicapped diploma   Highest education level: Not on file  Occupational History    Employer: DISABLED  Tobacco Use   Smoking status: Never   Smokeless tobacco: Never   Tobacco comments:    smoking cessation materials not required  Vaping Use   Vaping Use: Never used  Substance and Sexual Activity   Alcohol use: No    Alcohol/week: 0.0 standard drinks   Drug use: No   Sexual activity: Never  Other Topics Concern   Not on file  Social History Narrative   Moderate  mental retardation. Parents are the primary caregiver, he lives with them   Social Determinants of Health   Financial Resource Strain: Low Risk    Difficulty of Paying Living Expenses: Not hard at all  Food Insecurity: No Food Insecurity   Worried About Charity fundraiser in the Last Year: Never true   Arboriculturist in the Last Year: Never true  Transportation Needs: No Transportation Needs   Lack of Transportation (Medical): No   Lack of Transportation (Non-Medical): No  Physical Activity: Insufficiently Active   Days of Exercise per Week: 7 days   Minutes of Exercise per Session: 10 min  Stress: No Stress Concern Present   Feeling of Stress : Not at all  Social Connections: Socially Isolated   Frequency of Communication with Friends and Family: Twice a week   Frequency of Social Gatherings with Friends and Family: Once a week   Attends Religious Services: Never   Marine scientist or Organizations: No   Attends Music therapist: Never   Marital Status: Never married    Tobacco Counseling Counseling given: Not Answered Tobacco comments: smoking cessation materials not required   Clinical Intake:  Pre-visit preparation completed: Yes  Pain : No/denies pain     Nutritional Risks: None Diabetes: No  How often do you need to have someone help you when you read instructions, pamphlets, or other written materials from your doctor or pharmacy?: 4 - Often    Interpreter Needed?: No  Information entered by :: Clemetine Marker LPN   Activities of Daily Living In your present state of health, do you have any difficulty performing the following activities: 04/14/2021 12/10/2020  Hearing? N N  Comment declines hearing aids -  Vision? N N  Difficulty concentrating or making decisions? Y N  Walking or climbing stairs? N N  Dressing or bathing? N N  Doing errands, shopping? Tempie Donning  Preparing Food and eating ? N -  Using the Toilet? N -  In the past six months,  have you accidently leaked urine? N -  Do you have problems with loss of bowel control? N -  Managing your Medications? Y -  Managing your Finances? Y -  Housekeeping or managing your Housekeeping? Y -  Some recent data might be hidden    Patient Care Team: [name], MD as PCP - General (Family Medicine)  Indicate any recent Medical Services you may have received from other than [facility] providers in the past year (date may be approximate).     Assessment:   This is a routine wellness examination for [place].  Hearing/Vision screen Hearing Screening - Comments::  Pt denies hearing difficulty Vision Screening - Comments:: Past due for eye exam. Mother says he  does not have any complaints about vision.   Dietary issues and exercise activities discussed: Current Exercise Habits: Home exercise routine, Type of exercise: walking, Time (Minutes): 10, Frequency (Times/Week): 7, Weekly Exercise (Minutes/Week): 70, Intensity: Mild, Exercise limited by: None identified   Goals Addressed             This Visit's Progress    DIET - INCREASE WATER INTAKE   On track    Recommend to drink at least 6-8 8oz glasses of water per day.        Depression Screen PHQ 2/9 Scores 04/14/2021 12/10/2020 06/09/2020 04/08/2020 12/10/2019 05/21/2019 04/05/2019  PHQ - 2 Score 0 0 0 0 0 - -  PHQ- 9 Score - - 0 - 0 - -  Exception Documentation - - - - - Medical reason Medical reason    Fall Risk Fall Risk  04/14/2021 12/10/2020 06/17/2020 06/09/2020 04/08/2020  Falls in the past year? 0 0 0 0 0  Number falls in past yr: 0 0 0 0 0  Injury with Fall? 0 0 0 0 0  Risk for fall due to : No Fall Risks - - - No Fall Risks  Risk for fall due to: Comment - - - - -  Follow up Falls prevention discussed - - - Falls prevention discussed    FALL RISK PREVENTION PERTAINING TO THE HOME:  Any stairs in or around the home? Yes  If so, are there any without handrails? No  Home free of loose throw rugs in walkways, pet beds,  electrical cords, etc? Yes  Adequate lighting in your home to reduce risk of falls? Yes   ASSISTIVE DEVICES UTILIZED TO PREVENT FALLS:  Life alert? No  Use of a cane, walker or w/c? No  Grab bars in the bathroom? No  Shower chair or bench in shower? No  Elevated toilet seat or a handicapped toilet? No   TIMED UP AND GO:  Was the test performed? No . Telephonic visit.   Cognitive Function: Cognitive status assessed by direct observation. Patient has current diagnosis of cognitive impairment. Patient is unable to complete screening 6CIT or MMSE.          Immunizations Immunization History  Administered Date(s) Administered   Influenza Split 10/06/2009, 07/21/2012   Influenza, Seasonal, Injecte, Preservative Fre 06/23/2010   Influenza,inj,Quad PF,6+ Mos 07/23/2013, 08/02/2014, 07/18/2015, 07/21/2016, 08/03/2017, 08/07/2018, 08/03/2019, 08/11/2020   Influenza-Unspecified 08/02/2014   Moderna Sars-Covid-2 Vaccination 12/05/2019, 01/03/2020, 09/02/2020, 01/18/2021   Tdap 06/23/2010, 12/10/2020   Zoster Recombinat (Shingrix) 06/09/2020, 08/11/2020    TDAP status: Up to date  Flu Vaccine status: Up to date  Pneumococcal vaccine status: Due, Education has been provided regarding the importance of this vaccine. Advised may receive this vaccine at local pharmacy or Health Dept. Aware to provide a copy of the vaccination record if obtained from local pharmacy or Health Dept. Verbalized acceptance and understanding.  Covid-19 vaccine status: Completed vaccines  Qualifies for Shingles Vaccine? Yes   Zostavax completed No   Shingrix Completed?: Yes  Screening Tests Health Maintenance  Topic Date Due   Pneumococcal Vaccine 67-13 Years old (1 - PCV) Never done   Hepatitis C Screening  Never done   COVID-19 Vaccine (5 - Booster for Moderna series) 05/20/2021   INFLUENZA VACCINE  06/01/2021   Fecal DNA (Cologuard)  03/19/2023   TETANUS/TDAP  12/10/2030   Zoster Vaccines- Shingrix   Completed   HPV VACCINES  Aged Out   HIV Screening  Discontinued  Health Maintenance  Health Maintenance Due  Topic Date Due   Pneumococcal Vaccine 44-87 Years old (1 - PCV) Never done   Hepatitis C Screening  Never done    Colorectal cancer screening: Type of screening: Cologuard. Completed 03/18/20. Repeat every 3 years  Lung Cancer Screening: (Low Dose CT Chest recommended if Age 78-80 years, 30 pack-year currently smoking OR have quit w/in 15years.) does not qualify.  Additional Screening:  Hepatitis C Screening: does qualify; postponed  Vision Screening: Recommended annual ophthalmology exams for early detection of glaucoma and other disorders of the eye. Is the patient up to date with their annual eye exam?  No  Who is the provider or what is the name of the office in which the patient attends annual eye exams? Not established If pt is not established with a provider, would they like to be referred to a provider to establish care? No .   Dental Screening: Recommended annual dental exams for proper oral hygiene  Community Resource Referral / Chronic Care Management: CRR required this visit?  No   CCM required this visit?  No      Plan:     I have personally reviewed and noted the following in the patient's chart:   Medical and social history Use of alcohol, tobacco or illicit drugs  Current medications and supplements including opioid prescriptions. Patient is not currently taking opioid prescriptions. Functional ability and status Nutritional status Physical activity Advanced directives List of other physicians Hospitalizations, surgeries, and ER visits in previous 12 months Vitals Screenings to include cognitive, depression, and falls Referrals and appointments  In addition, I have reviewed and discussed with patient certain preventive protocols, quality metrics, and best practice recommendations. A written personalized care plan for preventive services as  well as general preventive health recommendations were provided to patient.     Clemetine Marker, LPN   1/66/0600   Nurse Notes: none

## 2021-06-08 NOTE — Progress Notes (Signed)
Name: Alec Campbell   MRN: [id number]    DOB: Dec 29, 1970   Date:06/09/2021       Progress Note  Subjective  Chief Complaint  Follow Up  HPI  Hyponatremia and hypokalemia:last levels were normal , last levels improved    Intellectual disability with epilepsy:  he lives with his parents, parents dispense his medications he also needs assistance with transportation, he is unable to manage his finances. Parents also need to be present during his office visits.   He developed seizures at 69 weeks of age , he was born with cleft lips and palate. He was diagnosed with developmental delay as a toddler. He is able to do ADL, but not able to do Instrumental activities of daily living .    Seizure disorder: no symptoms in many years, mother is terrified of stopping his medication because his symptoms were severe. He has a history of  hyponatremia, he saw nephrologist for a period of time, but last sodium was back to normal.   He is now on only two pill of phenobarbital and 4 pills of carbamazepine daily without any side effects. Last labs back to normal   Dyslipidemia: not on medication, discussed life style modification, positive family history of heart disease - father had a heart attack in his 29's. We will hold off on starting statin until around age 47. He has good exercise tolerance.   The 10-year ASCVD risk score Mikey Bussing DC Jr., et al., 2013) is: 4.7%   Values used to calculate the score:     Age: 50 years     Sex: Male     Is Non-Hispanic African American: No     Diabetic: No     Tobacco smoker: No     Systolic Blood Pressure: 123456 mmHg     Is BP treated: No     HDL Cholesterol: 42 mg/dL     Total Cholesterol: 225 mg/dL   Inguinal hernia: he had an inguinal hernia repair on right side, mother has noticed it getting larger, does not seem to be cause any problems at this time, reassurance given, they are aware of symptoms on strangulation   Cold sore: no recent outbreaks, he has Valtrex at home,  he takes it prn   Constipation idiopathic: taking Miralax and helps him have a bowel movements 3-4 times a week, no pain or straining.   Patient Active Problem List   Diagnosis Date Noted   Inguinal hernia with gangrene, recurrent bilateral 06/17/2020   Cold sore 02/13/2016   Vitamin D deficiency 07/20/2015   Intellectual disability with epilepsy ([place]) 07/16/2015   Seizure disorder ([place]) 07/16/2015   Seasonal allergic rhinitis 07/16/2015   Dyslipidemia 07/16/2015   Hyponatremia 06/13/2015   Abnormal blood sugar 06/19/2008    Past Surgical History:  Procedure Laterality Date   ANKLE SURGERY Right    CLEFT LIP REPAIR     HERNIA REPAIR     Inguinal   TYMPANOSTOMY TUBE PLACEMENT      Family History  Problem Relation Age of Onset   Hypertension Mother    Thyroid disease Mother    CAD Father    Hypertension Father    Hyperlipidemia Father    Healthy Brother    Healthy Sister    Diabetes Brother    COPD Brother    Emphysema Brother    Alcohol abuse Brother    Hypertension Brother    Aneurysm Brother        brain  Social History   Tobacco Use   Smoking status: Never   Smokeless tobacco: Never   Tobacco comments:    smoking cessation materials not required  Substance Use Topics   Alcohol use: No    Alcohol/week: 0.0 standard drinks     Current Outpatient Medications:    carbamazepine (TEGRETOL) 200 MG tablet, TAKE  TWO  TABLETS BY MOUTH IN  THE  MORNING , ONE  AT  LUNCH  AND  ONE  AT  NIGHT, Disp: 360 tablet, Rfl: 1   loratadine (CLARITIN) 10 MG tablet, Take 1 tablet (10 mg total) by mouth daily., Disp: 30 tablet, Rfl: 5   Multiple Vitamin (MULTIVITAMIN) tablet, Take 1 tablet by mouth daily., Disp: , Rfl:    PHENobarbital (LUMINAL) 64.8 MG tablet, Take 1 tablet by mouth twice daily, Disp: 180 tablet, Rfl: 1   polyethylene glycol powder (GLYCOLAX/MIRALAX) 17 GM/SCOOP powder, Take 17 g by mouth once. 1 scoop per day, Disp: , Rfl:    SF 5000 PLUS 1.1 % CREA dental  cream, BRUSH ON TEETH TWICE DAILY. NO EATING OR DRINKING FOR 30 MINUTES AFTER USE, Disp: 51 g, Rfl: 0   valACYclovir (VALTREX) 1000 MG tablet, TAKE 1 TABLET BY MOUTH TWICE DAILY AS NEEDED FOR  OUTBREAK, Disp: 30 tablet, Rfl: 0  No Known Allergies  I personally reviewed active problem list, medication list, allergies, family history, social history, health maintenance with the patient/caregiver today.   ROS  Constitutional: Negative for fever or weight change.  Respiratory: Negative for cough and shortness of breath.   Cardiovascular: Negative for chest pain or palpitations.  Gastrointestinal: Negative for abdominal pain, no bowel changes.  Musculoskeletal: Negative for gait problem or joint swelling.  Skin: Negative for rash.  Neurological: Negative for dizziness or headache.  No other specific complaints in a complete review of systems (except as listed in HPI above).   Objective  Vitals:   06/09/21 0909  BP: 118/68  Pulse: 86  Resp: 16  Temp: (!) 97.4 F (36.3 C)  SpO2: 98%  Weight: 164 lb (74.4 kg)  Height: '5\' 7"'$  (1.702 m)    Body mass index is 25.69 kg/m.  Physical Exam  Constitutional: Patient appears well-developed and well-nourished.  No distress.  HEENT: head atraumatic, normocephalic, pupils equal and reactive to light, neck supple Cardiovascular: Normal rate, regular rhythm and normal heart sounds.  No murmur heard. No BLE edema. Pulmonary/Chest: Effort normal and breath sounds normal. No respiratory distress. Abdominal: Soft.  There is no tenderness. Psychiatric: Patient has a normal mood and affect.   PHQ2/9: Depression screen [facility] 2/9 06/09/2021 04/14/2021 12/10/2020 06/09/2020 04/08/2020  Decreased Interest 0 0 0 0 0  Down, Depressed, Hopeless 0 0 0 0 0  PHQ - 2 Score 0 0 0 0 0  Altered sleeping - - - 0 -  Tired, decreased energy - - - 0 -  Change in appetite - - - 0 -  Feeling bad or failure about yourself  - - - 0 -  Trouble concentrating - - - 0 -  Moving  slowly or fidgety/restless - - - 0 -  Suicidal thoughts - - - 0 -  PHQ-9 Score - - - 0 -  Difficult doing work/chores - - - - -    phq 9 is negative   Fall Risk: Fall Risk  06/09/2021 04/14/2021 12/10/2020 06/17/2020 06/09/2020  Falls in the past year? 0 0 0 0 0  Number falls in past yr: 0 0 0  0 0  Injury with Fall? 0 0 0 0 0  Risk for fall due to : - No Fall Risks - - -  Risk for fall due to: Comment - - - - -  Follow up - Falls prevention discussed - - -      Functional Status Survey: Is the patient deaf or have difficulty hearing?: No Does the patient have difficulty seeing, even when wearing glasses/contacts?: No Does the patient have difficulty concentrating, remembering, or making decisions?: Yes Does the patient have difficulty walking or climbing stairs?: No Does the patient have difficulty dressing or bathing?: No Does the patient have difficulty doing errands alone such as visiting a doctor's office or shopping?: Yes    Assessment & Plan  1. Dyslipidemia   2. Seizure disorder [facility])  Doing well, continue current medication   3. Hyponatremia  Recheck next visit   4. Vitamin D deficiency   5. Hypokalemia   6. Chronic idiopathic constipation  Doing better   7. Intellectual disability with epilepsy ([place])  - carbamazepine (TEGRETOL) 200 MG tablet; TAKE  TWO  TABLETS BY MOUTH IN  THE  MORNING , ONE  AT  LUNCH  AND  ONE  AT  NIGHT  Dispense: 360 tablet; Refill: 1 - PHENobarbital (LUMINAL) 64.8 MG tablet; Take 1 tablet (64.8 mg total) by mouth 2 (two) times daily.  Dispense: 180 tablet; Refill: 1

## 2021-06-09 ENCOUNTER — Ambulatory Visit (INDEPENDENT_AMBULATORY_CARE_PROVIDER_SITE_OTHER): Payer: Medicare Other | Admitting: Family Medicine

## 2021-06-09 ENCOUNTER — Other Ambulatory Visit: Payer: Self-pay

## 2021-06-09 ENCOUNTER — Encounter: Payer: Self-pay | Admitting: Family Medicine

## 2021-06-09 VITALS — BP 118/68 | HR 86 | Temp 97.4°F | Resp 16 | Ht 67.0 in | Wt 164.0 lb

## 2021-06-09 DIAGNOSIS — K5904 Chronic idiopathic constipation: Secondary | ICD-10-CM | POA: Diagnosis not present

## 2021-06-09 DIAGNOSIS — G40909 Epilepsy, unspecified, not intractable, without status epilepticus: Secondary | ICD-10-CM

## 2021-06-09 DIAGNOSIS — E871 Hypo-osmolality and hyponatremia: Secondary | ICD-10-CM

## 2021-06-09 DIAGNOSIS — E785 Hyperlipidemia, unspecified: Secondary | ICD-10-CM

## 2021-06-09 DIAGNOSIS — E876 Hypokalemia: Secondary | ICD-10-CM

## 2021-06-09 DIAGNOSIS — E559 Vitamin D deficiency, unspecified: Secondary | ICD-10-CM

## 2021-06-09 DIAGNOSIS — F79 Unspecified intellectual disabilities: Secondary | ICD-10-CM

## 2021-06-09 MED ORDER — PHENOBARBITAL 64.8 MG PO TABS: 64.8000 mg | ORAL_TABLET | Freq: Two times a day (BID) | ORAL | 1 refills | Status: DC

## 2021-06-09 MED ORDER — CARBAMAZEPINE 200 MG PO TABS: ORAL_TABLET | ORAL | 1 refills | Status: DC

## 2021-08-04 ENCOUNTER — Encounter: Payer: Self-pay | Admitting: General Surgery

## 2021-08-26 ENCOUNTER — Ambulatory Visit (INDEPENDENT_AMBULATORY_CARE_PROVIDER_SITE_OTHER): Payer: Medicare Other

## 2021-08-26 ENCOUNTER — Other Ambulatory Visit: Payer: Self-pay

## 2021-08-26 DIAGNOSIS — Z23 Encounter for immunization: Secondary | ICD-10-CM | POA: Diagnosis not present

## 2021-09-08 ENCOUNTER — Other Ambulatory Visit: Payer: Self-pay | Admitting: Family Medicine

## 2021-09-08 DIAGNOSIS — B001 Herpesviral vesicular dermatitis: Secondary | ICD-10-CM

## 2021-09-17 NOTE — Progress Notes (Signed)
Name: Alec Campbell   MRN: [id number]    DOB: 1971-03-29   Date:09/18/2021       Progress Note  Subjective  Chief Complaint  Annual Exam  HPI  Patient presents for annual CPE .  IPSS Questionnaire (AUA-7): Over the past month.   1)  How often have you had a sensation of not emptying your bladder completely after you finish urinating?  0 - Not at all  2)  How often have you had to urinate again less than two hours after you finished urinating? 0 - Not at all  3)  How often have you found you stopped and started again several times when you urinated?  0 - Not at all  4) How difficult have you found it to postpone urination?  0 - Not at all  5) How often have you had a weak urinary stream?  0 - Not at all  6) How often have you had to push or strain to begin urination?  0 - Not at all  7) How many times did you most typically get up to urinate from the time you went to bed until the time you got up in the morning?  0 - None  Total score:  0-7 mildly symptomatic   8-19 moderately symptomatic   20-35 severely symptomatic     Diet: drinking less sodas, only one per day  Exercise: discussed 150 minutes per week.   Depression: phq 9 is negative Depression screen [facility] 2/9 09/18/2021 06/09/2021 04/14/2021 12/10/2020 06/09/2020  Decreased Interest 0 0 0 0 0  Down, Depressed, Hopeless 0 0 0 0 0  PHQ - 2 Score 0 0 0 0 0  Altered sleeping 0 - - - 0  Tired, decreased energy 0 - - - 0  Change in appetite 0 - - - 0  Feeling bad or failure about yourself  0 - - - 0  Trouble concentrating 0 - - - 0  Moving slowly or fidgety/restless 0 - - - 0  Suicidal thoughts 0 - - - 0  PHQ-9 Score 0 - - - 0  Difficult doing work/chores - - - - -    Hypertension:  BP Readings from Last 3 Encounters:  09/18/21 112/64  06/09/21 118/68  12/10/20 116/72    Obesity: Wt Readings from Last 3 Encounters:  09/18/21 166 lb (75.3 kg)  06/09/21 164 lb (74.4 kg)  12/10/20 169 lb 9.6 oz (76.9 kg)   BMI Readings  from Last 3 Encounters:  09/18/21 26.00 kg/m  06/09/21 25.69 kg/m  12/10/20 26.56 kg/m     Lipids:  Lab Results  Component Value Date   CHOL 225 (H) 12/10/2020   CHOL 210 (H) 12/10/2019   CHOL 225 (H) 03/06/2018   Lab Results  Component Value Date   HDL 42 12/10/2020   HDL 41 12/10/2019   HDL 51 03/06/2018   Lab Results  Component Value Date   LDLCALC 151 (H) 12/10/2020   LDLCALC 132 (H) 12/10/2019   LDLCALC 147 (H) 03/06/2018   Lab Results  Component Value Date   TRIG 184 (H) 12/10/2020   TRIG 227 (H) 12/10/2019   TRIG 141 03/06/2018   Lab Results  Component Value Date   CHOLHDL 5.4 (H) 12/10/2020   CHOLHDL 5.1 (H) 12/10/2019   CHOLHDL 4.4 03/06/2018   No results found for: LDLDIRECT Glucose:  Glucose, Bld  Date Value Ref Range Status  01/12/2021 63 (L) 65 - 99 mg/dL Final  Comment:    .            Fasting reference interval .   12/10/2020 81 65 - 99 mg/dL Final    Comment:    .            Fasting reference interval .   12/10/2019 64 (L) 65 - 99 mg/dL Final    Comment:    .            Fasting reference interval .     Flowsheet Row Clinical Support from 04/14/2021 in [facility]  AUDIT-C Score 0       Single STD testing and prevention (HIV/chl/gon/syphilis): N/A Hep C: N/A  Skin cancer: Discussed monitoring for atypical lesions Colorectal cancer: 03/18/20 Prostate cancer:  No results found for: PSA   Lung cancer: Low Dose CT Chest recommended if Age 82-80 years, 30 pack-year currently smoking OR have quit w/in 15years. Patient does not qualify.   AAA: The USPSTF recommends one-time screening with ultrasonography in men ages 83 to 10 years who have ever smoked He does not qualify  ECG:  N/A  Vaccines:   Shingrix: up to date  Pneumonia: educated and discussed with patient. Flu: up to date  COVID-19 : advised bivalent booster .  Advanced Care Planning: A voluntary discussion about advance care planning  including the explanation and discussion of advance directives.  Discussed health care proxy and Living will, and the patient was able to identify a health care proxy as parents - legal guardians   Patient Active Problem List   Diagnosis Date Noted   Inguinal hernia with gangrene, recurrent bilateral 06/17/2020   Cold sore 02/13/2016   Vitamin D deficiency 07/20/2015   Intellectual disability with epilepsy ([place]) 07/16/2015   Seizure disorder ([place]) 07/16/2015   Seasonal allergic rhinitis 07/16/2015   Dyslipidemia 07/16/2015   Hyponatremia 06/13/2015   Abnormal blood sugar 06/19/2008    Past Surgical History:  Procedure Laterality Date   ANKLE SURGERY Right    CLEFT LIP REPAIR     HERNIA REPAIR     Inguinal   TYMPANOSTOMY TUBE PLACEMENT      Family History  Problem Relation Age of Onset   Hypertension Mother    Thyroid disease Mother    CAD Father    Hypertension Father    Hyperlipidemia Father    Healthy Brother    Healthy Sister    Diabetes Brother    COPD Brother    Emphysema Brother    Alcohol abuse Brother    Hypertension Brother    Aneurysm Brother        brain    Social History   Socioeconomic History   Marital status: Single    Spouse name: Not on file   Number of children: 0   Years of education: Handicapped diploma   Highest education level: Not on file  Occupational History    Employer: DISABLED  Tobacco Use   Smoking status: Never   Smokeless tobacco: Never   Tobacco comments:    smoking cessation materials not required  Vaping Use   Vaping Use: Never used  Substance and Sexual Activity   Alcohol use: No    Alcohol/week: 0.0 standard drinks   Drug use: No   Sexual activity: Never  Other Topics Concern   Not on file  Social History Narrative   Moderate mental retardation. Parents are the primary caregiver, he lives with them   Social Determinants of Health  Financial Resource Strain: Low Risk    Difficulty of Paying Living Expenses:  Not hard at all  Food Insecurity: No Food Insecurity   Worried About Charity fundraiser in the Last Year: Never true   Ran Out of Food in the Last Year: Never true  Transportation Needs: No Transportation Needs   Lack of Transportation (Medical): No   Lack of Transportation (Non-Medical): No  Physical Activity: Insufficiently Active   Days of Exercise per Week: 7 days   Minutes of Exercise per Session: 10 min  Stress: No Stress Concern Present   Feeling of Stress : Not at all  Social Connections: Socially Isolated   Frequency of Communication with Friends and Family: Twice a week   Frequency of Social Gatherings with Friends and Family: Once a week   Attends Religious Services: Never   Marine scientist or Organizations: No   Attends Music therapist: Never   Marital Status: Never married  Human resources officer Violence: Not At Risk   Fear of Current or Ex-Partner: No   Emotionally Abused: No   Physically Abused: No   Sexually Abused: No     Current Outpatient Medications:    carbamazepine (TEGRETOL) 200 MG tablet, TAKE  TWO  TABLETS BY MOUTH IN  THE  MORNING , ONE  AT  LUNCH  AND  ONE  AT  NIGHT, Disp: 360 tablet, Rfl: 1   loratadine (CLARITIN) 10 MG tablet, Take 1 tablet (10 mg total) by mouth daily., Disp: 30 tablet, Rfl: 5   Multiple Vitamin (MULTIVITAMIN) tablet, Take 1 tablet by mouth daily., Disp: , Rfl:    PHENobarbital (LUMINAL) 64.8 MG tablet, Take 1 tablet (64.8 mg total) by mouth 2 (two) times daily., Disp: 180 tablet, Rfl: 1   polyethylene glycol powder (GLYCOLAX/MIRALAX) 17 GM/SCOOP powder, Take 17 g by mouth once. 1 scoop per day, Disp: , Rfl:    PREVIDENT 5000 SENSITIVE 1.1-5 % GEL, Place onto teeth 2 (two) times daily., Disp: , Rfl:    SF 5000 PLUS 1.1 % CREA dental cream, BRUSH ON TEETH TWICE DAILY. NO EATING OR DRINKING FOR 30 MINUTES AFTER USE, Disp: 51 g, Rfl: 0   valACYclovir (VALTREX) 1000 MG tablet, TAKE 1 TABLET BY MOUTH TWICE DAILY AS NEEDED  FOR  OUTBREAK, Disp: 30 tablet, Rfl: 0  No Known Allergies   ROS  Constitutional: Negative for fever or weight change.  Respiratory: Negative for cough and shortness of breath.   Cardiovascular: Negative for chest pain or palpitations.  Gastrointestinal: Negative for abdominal pain, no bowel changes.  Musculoskeletal: Negative for gait problem or joint swelling.  Skin: Negative for rash.  Neurological: Negative for dizziness or headache.  No other specific complaints in a complete review of systems (except as listed in HPI above).    Objective  Vitals:   09/18/21 1439  BP: 112/64  Pulse: 95  Resp: 16  Temp: 98.1 F (36.7 C)  SpO2: 98%  Weight: 166 lb (75.3 kg)  Height: 5\' 7"  (1.702 m)    Body mass index is 26 kg/m.  Physical Exam  Constitutional: Patient appears well-developed and well-nourished. No distress.  HENT: Head: Normocephalic and atraumatic. Ears: B TMs ok, no erythema or effusion; Nose: Nose normal. Mouth/Throat: not done  Eyes: Conjunctivae and EOM are normal. Pupils are equal, round, and reactive to light. No scleral icterus.  Neck: Normal range of motion. Neck supple. No JVD present. No thyromegaly present.  Cardiovascular: Normal rate, regular rhythm and  normal heart sounds.  No murmur heard. No BLE edema. Chest pectus excavatum  Pulmonary/Chest: Effort normal and breath sounds normal. No respiratory distress. Abdominal: Soft. Bowel sounds are normal, no distension. There is no tenderness. Right inguinal hernia  MALE GENITALIA: Normal descended testes bilaterally, no masses palpated, no right inguinal hernia,  no lesions, no discharge RECTAL:not done  Musculoskeletal: Normal range of motion, no joint effusions. No gross deformities Neurological: he is alert and oriented to person, place, and time. No cranial nerve deficit. Coordination, balance, strength, speech and gait are normal.  Skin: Skin is warm and dry. No rash noted. No erythema.  Psychiatric:  Patient has a normal mood and affect. behavior is normal. Judgment and thought content normal.   Fall Risk: Fall Risk  09/18/2021 06/09/2021 04/14/2021 12/10/2020 06/17/2020  Falls in the past year? 0 0 0 0 0  Number falls in past yr: 0 0 0 0 0  Injury with Fall? 0 0 0 0 0  Risk for fall due to : No Fall Risks - No Fall Risks - -  Risk for fall due to: Comment - - - - -  Follow up Falls prevention discussed - Falls prevention discussed - -      Functional Status Survey: Is the patient deaf or have difficulty hearing?: No Does the patient have difficulty seeing, even when wearing glasses/contacts?: No Does the patient have difficulty concentrating, remembering, or making decisions?: Yes Does the patient have difficulty walking or climbing stairs?: Yes Does the patient have difficulty dressing or bathing?: Yes Does the patient have difficulty doing errands alone such as visiting a doctor's office or shopping?: Yes    Assessment & Plan  1. Well adult exam   2. Seizure disorder (HCC)  - Carbamazepine Level (Tegretol), total - Phenobarbital level  3. Intellectual disability with epilepsy ([place])   4. Vitamin D deficiency   5. Dyslipidemia  - Lipid panel  6. Long-term use of high-risk medication  - COMPLETE METABOLIC PANEL WITH GFR - CBC with Differential/Platelet    -Prostate cancer screening and PSA options (with potential risks and benefits of testing vs not testing) were discussed along with recent recs/guidelines. -USPSTF grade A and B recommendations reviewed with patient; age-appropriate recommendations, preventive care, screening tests, etc discussed and encouraged; healthy living encouraged; see AVS for patient education given to patient -Discussed importance of 150 minutes of physical activity weekly, eat two servings of fish weekly, eat one serving of tree nuts ( cashews, pistachios, pecans, almonds.Marland Kitchen) every other day, eat 6 servings of fruit/vegetables daily and  drink plenty of water and avoid sweet beverages.

## 2021-09-18 ENCOUNTER — Other Ambulatory Visit: Payer: Self-pay

## 2021-09-18 ENCOUNTER — Ambulatory Visit (INDEPENDENT_AMBULATORY_CARE_PROVIDER_SITE_OTHER): Payer: Medicare Other | Admitting: Family Medicine

## 2021-09-18 ENCOUNTER — Encounter: Payer: Self-pay | Admitting: Family Medicine

## 2021-09-18 VITALS — BP 112/64 | HR 95 | Temp 98.1°F | Resp 16 | Ht 67.0 in | Wt 166.0 lb

## 2021-09-18 DIAGNOSIS — Z0000 Encounter for general adult medical examination without abnormal findings: Secondary | ICD-10-CM | POA: Diagnosis not present

## 2021-09-18 DIAGNOSIS — G40909 Epilepsy, unspecified, not intractable, without status epilepticus: Secondary | ICD-10-CM

## 2021-09-18 DIAGNOSIS — E785 Hyperlipidemia, unspecified: Secondary | ICD-10-CM | POA: Diagnosis not present

## 2021-09-18 DIAGNOSIS — F79 Unspecified intellectual disabilities: Secondary | ICD-10-CM

## 2021-09-18 DIAGNOSIS — Z79899 Other long term (current) drug therapy: Secondary | ICD-10-CM | POA: Diagnosis not present

## 2021-09-18 DIAGNOSIS — E559 Vitamin D deficiency, unspecified: Secondary | ICD-10-CM

## 2021-09-22 LAB — COMPLETE METABOLIC PANEL WITH GFR
AG Ratio: 1.9 (calc) (ref 1.0–2.5)
ALT: 10 U/L (ref 9–46)
AST: 15 U/L (ref 10–35)
Albumin: 4.9 g/dL (ref 3.6–5.1)
Alkaline phosphatase (APISO): 126 U/L (ref 35–144)
BUN: 11 mg/dL (ref 7–25)
CO2: 28 mmol/L (ref 20–32)
Calcium: 10.3 mg/dL (ref 8.6–10.3)
Chloride: 98 mmol/L (ref 98–110)
Creat: 0.96 mg/dL (ref 0.70–1.30)
Globulin: 2.6 g/dL (calc) (ref 1.9–3.7)
Glucose, Bld: 82 mg/dL (ref 65–99)
Potassium: 4.3 mmol/L (ref 3.5–5.3)
Sodium: 135 mmol/L (ref 135–146)
Total Bilirubin: 0.4 mg/dL (ref 0.2–1.2)
Total Protein: 7.5 g/dL (ref 6.1–8.1)
eGFR: 96 mL/min/{1.73_m2} (ref >=60)

## 2021-09-22 LAB — CBC WITH DIFFERENTIAL/PLATELET
Absolute Monocytes: 450 cells/uL (ref 200–950)
Basophils Absolute: 79 cells/uL (ref 0–200)
Basophils Relative: 1 %
Eosinophils Absolute: 403 cells/uL (ref 15–500)
Eosinophils Relative: 5.1 %
HCT: 40.1 % (ref 38.5–50.0)
Hemoglobin: 13.8 g/dL (ref 13.2–17.1)
Lymphs Abs: 2346 cells/uL (ref 850–3900)
MCH: 29.9 pg (ref 27.0–33.0)
MCHC: 34.4 g/dL (ref 32.0–36.0)
MCV: 87 fL (ref 80.0–100.0)
MPV: 12.6 fL — ABNORMAL HIGH (ref 7.5–12.5)
Monocytes Relative: 5.7 %
Neutro Abs: 4622 cells/uL (ref 1500–7800)
Neutrophils Relative %: 58.5 %
Platelets: 251 10*3/uL (ref 140–400)
RBC: 4.61 10*6/uL (ref 4.20–5.80)
RDW: 12.6 % (ref 11.0–15.0)
Total Lymphocyte: 29.7 %
WBC: 7.9 10*3/uL (ref 3.8–10.8)

## 2021-09-22 LAB — LIPID PANEL
Cholesterol: 244 mg/dL — ABNORMAL HIGH (ref <200)
HDL: 48 mg/dL (ref >=40)
LDL Cholesterol (Calc): 167 mg/dL (calc) — ABNORMAL HIGH
Non-HDL Cholesterol (Calc): 196 mg/dL (calc) — ABNORMAL HIGH (ref <130)
Total CHOL/HDL Ratio: 5.1 (calc) — ABNORMAL HIGH (ref <5.0)
Triglycerides: 150 mg/dL — ABNORMAL HIGH (ref <150)

## 2021-09-22 LAB — CARBAMAZEPINE LEVEL, TOTAL: Carbamazepine Lvl: 7.5 mg/L (ref 4.0–12.0)

## 2021-09-22 LAB — PHENOBARBITAL LEVEL: Phenobarbital, Serum: 26.8 mg/L (ref 15.0–40.0)

## 2021-09-28 ENCOUNTER — Other Ambulatory Visit: Payer: Self-pay | Admitting: Family Medicine

## 2021-09-28 MED ORDER — ROSUVASTATIN CALCIUM 10 MG PO TABS: 10.0000 mg | ORAL_TABLET | Freq: Every day | ORAL | 1 refills | Status: DC

## 2021-12-09 NOTE — Progress Notes (Signed)
Name: Alec Campbell   MRN: [id number]    DOB: Apr 06, 1971   Date:12/10/2021       Progress Note  Subjective  Chief Complaint  Follow Up  HPI  Hyponatremia and hypokalemia:last levels were normal , last levels improved , we will recheck labs    Intellectual disability with epilepsy:  he lives with his parents, parents dispense his medications he also needs assistance with transportation, he is unable to manage his finances. Parents also need to be present during his office visits.   He developed seizures at 9 weeks of age , he was born with cleft lips and palate. He was diagnosed with developmental delay as a toddler. He is able to do ADL, but not able to do Instrumental activities of daily living , he does recognizes his medications but parents have to remind him when to take it .    Seizure disorder: no symptoms in many years, mother is terrified of stopping his medication because his symptoms were severe. He has a history of  hyponatremia, he saw nephrologist for a period of time, but last sodium was back to normal.   He is now on only two pill of phenobarbital and 4 pills of carbamazepine daily without any side effects. We will recheck labs today   Dyslipidemia: not on medication, discussed life style modification, positive family history of heart disease - father had a heart attack in his 49's. Last LDL was up to 169 and we started him on statin therapy, we will recheck labs today . No side effects   The 10-year ASCVD risk score (Arnett DK, et al., 2019) is: 5.1%   Values used to calculate the score:     Age: 51 years     Sex: Male     Is Non-Hispanic African American: No     Diabetic: No     Tobacco smoker: No     Systolic Blood Pressure: 654 mmHg     Is BP treated: No     HDL Cholesterol: 48 mg/dL     Total Cholesterol: 244 mg/dL   Inguinal hernia: he had an inguinal hernia repair on right side, mother has noticed it getting larger, does not seem to be cause any problems at this time,  reassurance given, they are aware of symptoms on strangulation   Cold sore: no recent outbreaks, he has Valtrex at home, he takes it prn .No recent outbreak   Constipation idiopathic: taking Miralax and helps him have a bowel movements 3-4 times a week, no pain or straining. He was unable to tell me how it looks like.   Patient Active Problem List   Diagnosis Date Noted   Inguinal hernia with gangrene, recurrent bilateral 06/17/2020   Cold sore 02/13/2016   Vitamin D deficiency 07/20/2015   Intellectual disability with epilepsy ([place]) 07/16/2015   Seizure disorder ([place]) 07/16/2015   Seasonal allergic rhinitis 07/16/2015   Dyslipidemia 07/16/2015   Hyponatremia 06/13/2015   Abnormal blood sugar 06/19/2008    Past Surgical History:  Procedure Laterality Date   ANKLE SURGERY Right    CLEFT LIP REPAIR     HERNIA REPAIR     Inguinal   TYMPANOSTOMY TUBE PLACEMENT      Family History  Problem Relation Age of Onset   Hypertension Mother    Thyroid disease Mother    CAD Father    Hypertension Father    Hyperlipidemia Father    Healthy Brother    Healthy Sister  Diabetes Brother    COPD Brother    Emphysema Brother    Alcohol abuse Brother    Hypertension Brother    Aneurysm Brother        brain    Social History   Tobacco Use   Smoking status: Never   Smokeless tobacco: Never   Tobacco comments:    smoking cessation materials not required  Substance Use Topics   Alcohol use: No    Alcohol/week: 0.0 standard drinks     Current Outpatient Medications:    carbamazepine (TEGRETOL) 200 MG tablet, TAKE  TWO  TABLETS BY MOUTH IN  THE  MORNING , ONE  AT  LUNCH  AND  ONE  AT  NIGHT, Disp: 360 tablet, Rfl: 1   loratadine (CLARITIN) 10 MG tablet, Take 1 tablet (10 mg total) by mouth daily., Disp: 30 tablet, Rfl: 5   Multiple Vitamin (MULTIVITAMIN) tablet, Take 1 tablet by mouth daily., Disp: , Rfl:    PHENobarbital (LUMINAL) 64.8 MG tablet, Take 1 tablet (64.8 mg total) by  mouth 2 (two) times daily., Disp: 180 tablet, Rfl: 1   polyethylene glycol powder (GLYCOLAX/MIRALAX) 17 GM/SCOOP powder, Take 17 g by mouth once. 1 scoop per day, Disp: , Rfl:    PREVIDENT 5000 SENSITIVE 1.1-5 % GEL, Place onto teeth 2 (two) times daily., Disp: , Rfl:    rosuvastatin (CRESTOR) 10 MG tablet, Take 1 tablet (10 mg total) by mouth daily., Disp: 90 tablet, Rfl: 1   SF 5000 PLUS 1.1 % CREA dental cream, BRUSH ON TEETH TWICE DAILY. NO EATING OR DRINKING FOR 30 MINUTES AFTER USE, Disp: 51 g, Rfl: 0   valACYclovir (VALTREX) 1000 MG tablet, TAKE 1 TABLET BY MOUTH TWICE DAILY AS NEEDED FOR  OUTBREAK, Disp: 30 tablet, Rfl: 0  No Known Allergies  I personally reviewed active problem list, medication list, allergies, family history, social history, health maintenance with the patient/caregiver today.   ROS  Constitutional: Negative for fever or weight change.  Respiratory: Negative for cough and shortness of breath.   Cardiovascular: Negative for chest pain or palpitations.  Gastrointestinal: Negative for abdominal pain, no bowel changes.  Musculoskeletal: Negative for gait problem or joint swelling.  Skin: Negative for rash.  Neurological: Negative for dizziness or headache.  No other specific complaints in a complete review of systems (except as listed in HPI above).   Objective  Vitals:   12/10/21 0919  BP: 126/74  Pulse: 86  Resp: 16  SpO2: 98%  Weight: 166 lb (75.3 kg)  Height: 5' 7" (1.702 m)    Body mass index is 26 kg/m.  Physical Exam  Constitutional: Patient appears well-developed and well-nourished.  No distress.  HEENT: head atraumatic, normocephalic, pupils equal and reactive to light, cleft palate repair , neck supple, throat within normal limits Cardiovascular: Normal rate, regular rhythm and normal heart sounds.  No murmur heard. No BLE edema. Pulmonary/Chest: Effort normal and breath sounds normal. No respiratory distress. Abdominal: Soft.  There is no  tenderness. Psychiatric: Patient has a normal mood and affect. behavior is normal. Judgment and thought content normal.   Recent Results (from the past 2160 hour(s))  Lipid panel     Status: Abnormal   Collection Time: 09/18/21  3:12 PM  Result Value Ref Range   Cholesterol 244 (H) <200 mg/dL   HDL 48 > OR = 40 mg/dL   Triglycerides 150 (H) <150 mg/dL   LDL Cholesterol (Calc) 167 (H) mg/dL (calc)    Comment:  Reference range: <100 . Desirable range <100 mg/dL for primary prevention;   <70 mg/dL for patients with CHD or diabetic patients  with > or = 2 CHD risk factors. Marland Kitchen LDL-C is now calculated using the Martin-Hopkins  calculation, which is a validated novel method providing  better accuracy than the Friedewald equation in the  estimation of LDL-C.  Cresenciano Genre et al. Annamaria Helling. 8416;606(30): 2061-2068  (http://education.[employer].com/faq/FAQ164)    Total CHOL/HDL Ratio 5.1 (H) <5.0 (calc)   Non-HDL Cholesterol (Calc) 196 (H) <130 mg/dL (calc)    Comment: For patients with diabetes plus 1 major ASCVD risk  factor, treating to a non-HDL-C goal of <100 mg/dL  (LDL-C of <70 mg/dL) is considered a therapeutic  option.   COMPLETE METABOLIC PANEL WITH GFR     Status: None   Collection Time: 09/18/21  3:12 PM  Result Value Ref Range   Glucose, Bld 82 65 - 99 mg/dL    Comment: .            Fasting reference interval .    BUN 11 7 - 25 mg/dL   Creat 0.96 0.70 - 1.30 mg/dL   eGFR 96 > OR = 60 mL/min/1.57m    Comment: The eGFR is based on the CKD-EPI 2021 equation. To calculate  the new eGFR from a previous Creatinine or Cystatin C result, go to https://www.kidney.org/professionals/ kdoqi/gfr%5Fcalculator    BUN/Creatinine Ratio NOT APPLICABLE 6 - 22 (calc)   Sodium 135 135 - 146 mmol/L   Potassium 4.3 3.5 - 5.3 mmol/L   Chloride 98 98 - 110 mmol/L   CO2 28 20 - 32 mmol/L   Calcium 10.3 8.6 - 10.3 mg/dL   Total Protein 7.5 6.1 - 8.1 g/dL   Albumin 4.9 3.6 - 5.1 g/dL    Globulin 2.6 1.9 - 3.7 g/dL (calc)   AG Ratio 1.9 1.0 - 2.5 (calc)   Total Bilirubin 0.4 0.2 - 1.2 mg/dL   Alkaline phosphatase (APISO) 126 35 - 144 U/L   AST 15 10 - 35 U/L   ALT 10 9 - 46 U/L  CBC with Differential/Platelet     Status: Abnormal   Collection Time: 09/18/21  3:12 PM  Result Value Ref Range   WBC 7.9 3.8 - 10.8 Thousand/uL   RBC 4.61 4.20 - 5.80 Million/uL   Hemoglobin 13.8 13.2 - 17.1 g/dL   HCT 40.1 38.5 - 50.0 %   MCV 87.0 80.0 - 100.0 fL   MCH 29.9 27.0 - 33.0 pg   MCHC 34.4 32.0 - 36.0 g/dL   RDW 12.6 11.0 - 15.0 %   Platelets 251 140 - 400 Thousand/uL   MPV 12.6 (H) 7.5 - 12.5 fL   Neutro Abs 4,622 1,500 - 7,800 cells/uL   Lymphs Abs 2,346 850 - 3,900 cells/uL   Absolute Monocytes 450 200 - 950 cells/uL   Eosinophils Absolute 403 15 - 500 cells/uL   Basophils Absolute 79 0 - 200 cells/uL   Neutrophils Relative % 58.5 %   Total Lymphocyte 29.7 %   Monocytes Relative 5.7 %   Eosinophils Relative 5.1 %   Basophils Relative 1.0 %  Carbamazepine Level (Tegretol), total     Status: None   Collection Time: 09/18/21  3:12 PM  Result Value Ref Range   Carbamazepine Lvl 7.5 4.0 - 12.0 mg/L  Phenobarbital level     Status: None   Collection Time: 09/18/21  3:12 PM  Result Value Ref Range   Phenobarbital, Serum 26.8 15.0 -  40.0 mg/L    PHQ2/9: Depression screen [facility] 2/9 12/10/2021 09/18/2021 06/09/2021 04/14/2021 12/10/2020  Decreased Interest 0 0 0 0 0  Down, Depressed, Hopeless 0 0 0 0 0  PHQ - 2 Score 0 0 0 0 0  Altered sleeping 0 0 - - -  Tired, decreased energy 0 0 - - -  Change in appetite 0 0 - - -  Feeling bad or failure about yourself  0 0 - - -  Trouble concentrating 0 0 - - -  Moving slowly or fidgety/restless 0 0 - - -  Suicidal thoughts 0 0 - - -  PHQ-9 Score 0 0 - - -  Difficult doing work/chores - - - - -    phq 9 is negative   Fall Risk: Fall Risk  12/10/2021 09/18/2021 06/09/2021 04/14/2021 12/10/2020  Falls in the past year? 0 0 0 0 0  Number  falls in past yr: 0 0 0 0 0  Injury with Fall? 0 0 0 0 0  Risk for fall due to : No Fall Risks No Fall Risks - No Fall Risks -  Risk for fall due to: Comment - - - - -  Follow up Falls prevention discussed Falls prevention discussed - Falls prevention discussed -      Functional Status Survey: Is the patient deaf or have difficulty hearing?: No Does the patient have difficulty seeing, even when wearing glasses/contacts?: No Does the patient have difficulty concentrating, remembering, or making decisions?: No Does the patient have difficulty walking or climbing stairs?: No Does the patient have difficulty dressing or bathing?: No Does the patient have difficulty doing errands alone such as visiting a doctor's office or shopping?: Yes    Assessment & Plan  1. Seizure disorder (HCC)  - COMPLETE METABOLIC PANEL WITH GFR - Phenobarbital level - carbamazepine (TEGRETOL) 200 MG tablet; TAKE  TWO  TABLETS BY MOUTH IN  THE  MORNING , ONE  AT  LUNCH  AND  ONE  AT  NIGHT  Dispense: 360 tablet; Refill: 1 - PHENobarbital (LUMINAL) 64.8 MG tablet; Take 1 tablet (64.8 mg total) by mouth 2 (two) times daily.  Dispense: 180 tablet; Refill: 1 - Carbamazepine Level (Tegretol), total  2. Intellectual disability with epilepsy ([place])  - carbamazepine (TEGRETOL) 200 MG tablet; TAKE  TWO  TABLETS BY MOUTH IN  THE  MORNING , ONE  AT  LUNCH  AND  ONE  AT  NIGHT  Dispense: 360 tablet; Refill: 1 - PHENobarbital (LUMINAL) 64.8 MG tablet; Take 1 tablet (64.8 mg total) by mouth 2 (two) times daily.  Dispense: 180 tablet; Refill: 1  3. Dyslipidemia  - Lipid panel  4. Vitamin D deficiency  Taking supplementation   5. Seasonal allergic rhinitis, unspecified trigger   6. Chronic idiopathic constipation   7. Long-term use of high-risk medication  - COMPLETE METABOLIC PANEL WITH GFR - CBC with Differential/Platelet

## 2021-12-10 ENCOUNTER — Ambulatory Visit (INDEPENDENT_AMBULATORY_CARE_PROVIDER_SITE_OTHER): Payer: Medicare Other | Admitting: Family Medicine

## 2021-12-10 ENCOUNTER — Encounter: Payer: Self-pay | Admitting: Family Medicine

## 2021-12-10 VITALS — BP 126/74 | HR 86 | Resp 16 | Ht 67.0 in | Wt 166.0 lb

## 2021-12-10 DIAGNOSIS — G40909 Epilepsy, unspecified, not intractable, without status epilepticus: Secondary | ICD-10-CM

## 2021-12-10 DIAGNOSIS — E785 Hyperlipidemia, unspecified: Secondary | ICD-10-CM

## 2021-12-10 DIAGNOSIS — E559 Vitamin D deficiency, unspecified: Secondary | ICD-10-CM

## 2021-12-10 DIAGNOSIS — Z1159 Encounter for screening for other viral diseases: Secondary | ICD-10-CM

## 2021-12-10 DIAGNOSIS — K5904 Chronic idiopathic constipation: Secondary | ICD-10-CM

## 2021-12-10 DIAGNOSIS — F79 Unspecified intellectual disabilities: Secondary | ICD-10-CM

## 2021-12-10 DIAGNOSIS — Z79899 Other long term (current) drug therapy: Secondary | ICD-10-CM

## 2021-12-10 DIAGNOSIS — J302 Other seasonal allergic rhinitis: Secondary | ICD-10-CM

## 2021-12-10 MED ORDER — PHENOBARBITAL 64.8 MG PO TABS: 64.8000 mg | ORAL_TABLET | Freq: Two times a day (BID) | ORAL | 1 refills | Status: DC

## 2021-12-10 MED ORDER — CARBAMAZEPINE 200 MG PO TABS: ORAL_TABLET | ORAL | 1 refills | Status: DC

## 2021-12-12 LAB — COMPLETE METABOLIC PANEL WITH GFR
AG Ratio: 1.8 (calc) (ref 1.0–2.5)
ALT: 11 U/L (ref 9–46)
AST: 15 U/L (ref 10–35)
Albumin: 4.6 g/dL (ref 3.6–5.1)
Alkaline phosphatase (APISO): 141 U/L (ref 35–144)
BUN: 16 mg/dL (ref 7–25)
CO2: 29 mmol/L (ref 20–32)
Calcium: 9.6 mg/dL (ref 8.6–10.3)
Chloride: 99 mmol/L (ref 98–110)
Creat: 0.93 mg/dL (ref 0.70–1.30)
Globulin: 2.5 g/dL (calc) (ref 1.9–3.7)
Glucose, Bld: 85 mg/dL (ref 65–99)
Potassium: 4.1 mmol/L (ref 3.5–5.3)
Sodium: 133 mmol/L — ABNORMAL LOW (ref 135–146)
Total Bilirubin: 0.5 mg/dL (ref 0.2–1.2)
Total Protein: 7.1 g/dL (ref 6.1–8.1)
eGFR: 99 mL/min/{1.73_m2} (ref >=60)

## 2021-12-12 LAB — CBC WITH DIFFERENTIAL/PLATELET
Absolute Monocytes: 482 cells/uL (ref 200–950)
Basophils Absolute: 73 cells/uL (ref 0–200)
Basophils Relative: 1.1 %
Eosinophils Absolute: 462 cells/uL (ref 15–500)
Eosinophils Relative: 7 %
HCT: 38.6 % (ref 38.5–50.0)
Hemoglobin: 13.1 g/dL — ABNORMAL LOW (ref 13.2–17.1)
Lymphs Abs: 2072 cells/uL (ref 850–3900)
MCH: 29.9 pg (ref 27.0–33.0)
MCHC: 33.9 g/dL (ref 32.0–36.0)
MCV: 88.1 fL (ref 80.0–100.0)
MPV: 12.5 fL (ref 7.5–12.5)
Monocytes Relative: 7.3 %
Neutro Abs: 3511 cells/uL (ref 1500–7800)
Neutrophils Relative %: 53.2 %
Platelets: 208 10*3/uL (ref 140–400)
RBC: 4.38 10*6/uL (ref 4.20–5.80)
RDW: 12.4 % (ref 11.0–15.0)
Total Lymphocyte: 31.4 %
WBC: 6.6 10*3/uL (ref 3.8–10.8)

## 2021-12-12 LAB — LIPID PANEL
Cholesterol: 151 mg/dL (ref <200)
HDL: 46 mg/dL (ref >=40)
LDL Cholesterol (Calc): 85 mg/dL (calc)
Non-HDL Cholesterol (Calc): 105 mg/dL (calc) (ref <130)
Total CHOL/HDL Ratio: 3.3 (calc) (ref <5.0)
Triglycerides: 104 mg/dL (ref <150)

## 2021-12-12 LAB — HEPATITIS C ANTIBODY
Hepatitis C Ab: NONREACTIVE
SIGNAL TO CUT-OFF: 0.03 (ref <1.00)

## 2021-12-12 LAB — PHENOBARBITAL LEVEL: Phenobarbital, Serum: 26.8 mg/L (ref 15.0–40.0)

## 2021-12-12 LAB — CARBAMAZEPINE LEVEL, TOTAL: Carbamazepine Lvl: 7.8 mg/L (ref 4.0–12.0)

## 2021-12-14 ENCOUNTER — Other Ambulatory Visit: Payer: Self-pay | Admitting: Family Medicine

## 2021-12-14 ENCOUNTER — Encounter: Payer: Self-pay | Admitting: Family Medicine

## 2021-12-14 DIAGNOSIS — D649 Anemia, unspecified: Secondary | ICD-10-CM

## 2021-12-15 ENCOUNTER — Ambulatory Visit: Payer: Medicare Other | Admitting: Family Medicine

## 2021-12-28 ENCOUNTER — Other Ambulatory Visit: Payer: Self-pay

## 2021-12-28 MED ORDER — ROSUVASTATIN CALCIUM 10 MG PO TABS: 10.0000 mg | ORAL_TABLET | Freq: Every day | ORAL | 1 refills | Status: DC

## 2022-04-15 ENCOUNTER — Ambulatory Visit (INDEPENDENT_AMBULATORY_CARE_PROVIDER_SITE_OTHER): Payer: Medicare Other

## 2022-04-15 DIAGNOSIS — Z0000 Encounter for general adult medical examination without abnormal findings: Secondary | ICD-10-CM

## 2022-04-15 NOTE — Patient Instructions (Signed)
Alec Campbell , Thank you for taking time to come for your Medicare Wellness Visit. I appreciate your ongoing commitment to your health goals. Please review the following plan we discussed and let me know if I can assist you in the future.   Screening recommendations/referrals: Colonoscopy: Cologuard done 03/18/20. Repeat 03/2023 Recommended yearly ophthalmology/optometry visit for glaucoma screening and checkup Recommended yearly dental visit for hygiene and checkup  Vaccinations: Influenza vaccine: done 08/26/21 Pneumococcal vaccine: due Tdap vaccine: done 12/10/20 Shingles vaccine: done 06/09/20 & 08/11/20   Covid-19: done 12/05/19, 01/03/20, 09/02/20 & 01/18/21  Conditions/risks identified: Keep up the great work!  Next appointment: Follow up in one year for your annual wellness visit   Preventive Care 40-64 Years, Male Preventive care refers to lifestyle choices and visits with your health care provider that can promote health and wellness. What does preventive care include? A yearly physical exam. This is also called an annual well check. Dental exams once or twice a year. Routine eye exams. Ask your health care provider how often you should have your eyes checked. Personal lifestyle choices, including: Daily care of your teeth and gums. Regular physical activity. Eating a healthy diet. Avoiding tobacco and drug use. Limiting alcohol use. Practicing safe sex. Taking low-dose aspirin every day starting at age 69. What happens during an annual well check? The services and screenings done by your health care provider during your annual well check will depend on your age, overall health, lifestyle risk factors, and family history of disease. Counseling  Your health care provider may ask you questions about your: Alcohol use. Tobacco use. Drug use. Emotional well-being. Home and relationship well-being. Sexual activity. Eating habits. Work and work Statistician. Screening  You may  have the following tests or measurements: Height, weight, and BMI. Blood pressure. Lipid and cholesterol levels. These may be checked every 5 years, or more frequently if you are over 80 years old. Skin check. Lung cancer screening. You may have this screening every year starting at age 29 if you have a 30-pack-year history of smoking and currently smoke or have quit within the past 15 years. Fecal occult blood test (FOBT) of the stool. You may have this test every year starting at age 77. Flexible sigmoidoscopy or colonoscopy. You may have a sigmoidoscopy every 5 years or a colonoscopy every 10 years starting at age 44. Prostate cancer screening. Recommendations will vary depending on your family history and other risks. Hepatitis C blood test. Hepatitis B blood test. Sexually transmitted disease (STD) testing. Diabetes screening. This is done by checking your blood sugar (glucose) after you have not eaten for a while (fasting). You may have this done every 1-3 years. Discuss your test results, treatment options, and if necessary, the need for more tests with your health care provider. Vaccines  Your health care provider may recommend certain vaccines, such as: Influenza vaccine. This is recommended every year. Tetanus, diphtheria, and acellular pertussis (Tdap, Td) vaccine. You may need a Td booster every 10 years. Zoster vaccine. You may need this after age 63. Pneumococcal 13-valent conjugate (PCV13) vaccine. You may need this if you have certain conditions and have not been vaccinated. Pneumococcal polysaccharide (PPSV23) vaccine. You may need one or two doses if you smoke cigarettes or if you have certain conditions. Talk to your health care provider about which screenings and vaccines you need and how often you need them. This information is not intended to replace advice given to you by your health care provider.  Make sure you discuss any questions you have with your health care  provider. Document Released: 11/14/2015 Document Revised: 07/07/2016 Document Reviewed: 08/19/2015 Elsevier Interactive Patient Education  2017 [place] Prevention in the Home Falls can cause injuries. They can happen to people of all ages. There are many things you can do to make your home safe and to help prevent falls. What can I do on the outside of my home? Regularly fix the edges of walkways and driveways and fix any cracks. Remove anything that might make you trip as you walk through a door, such as a raised step or threshold. Trim any bushes or trees on the path to your home. Use bright outdoor lighting. Clear any walking paths of anything that might make someone trip, such as rocks or tools. Regularly check to see if handrails are loose or broken. Make sure that both sides of any steps have handrails. Any raised decks and porches should have guardrails on the edges. Have any leaves, snow, or ice cleared regularly. Use sand or salt on walking paths during winter. Clean up any spills in your garage right away. This includes oil or grease spills. What can I do in the bathroom? Use night lights. Install grab bars by the toilet and in the tub and shower. Do not use towel bars as grab bars. Use non-skid mats or decals in the tub or shower. If you need to sit down in the shower, use a plastic, non-slip stool. Keep the floor dry. Clean up any water that spills on the floor as soon as it happens. Remove soap buildup in the tub or shower regularly. Attach bath mats securely with double-sided non-slip rug tape. Do not have throw rugs and other things on the floor that can make you trip. What can I do in the bedroom? Use night lights. Make sure that you have a light by your bed that is easy to reach. Do not use any sheets or blankets that are too big for your bed. They should not hang down onto the floor. Have a firm chair that has side arms. You can use this for support while  you get dressed. Do not have throw rugs and other things on the floor that can make you trip. What can I do in the kitchen? Clean up any spills right away. Avoid walking on wet floors. Keep items that you use a lot in easy-to-reach places. If you need to reach something above you, use a strong step stool that has a grab bar. Keep electrical cords out of the way. Do not use floor polish or wax that makes floors slippery. If you must use wax, use non-skid floor wax. Do not have throw rugs and other things on the floor that can make you trip. What can I do with my stairs? Do not leave any items on the stairs. Make sure that there are handrails on both sides of the stairs and use them. Fix handrails that are broken or loose. Make sure that handrails are as long as the stairways. Check any carpeting to make sure that it is firmly attached to the stairs. Fix any carpet that is loose or worn. Avoid having throw rugs at the top or bottom of the stairs. If you do have throw rugs, attach them to the floor with carpet tape. Make sure that you have a light switch at the top of the stairs and the bottom of the stairs. If you do not have them,  ask someone to add them for you. What else can I do to help prevent falls? Wear shoes that: Do not have high heels. Have rubber bottoms. Are comfortable and fit you well. Are closed at the toe. Do not wear sandals. If you use a stepladder: Make sure that it is fully opened. Do not climb a closed stepladder. Make sure that both sides of the stepladder are locked into place. Ask someone to hold it for you, if possible. Clearly mark and make sure that you can see: Any grab bars or handrails. First and last steps. Where the edge of each step is. Use tools that help you move around (mobility aids) if they are needed. These include: Canes. Walkers. Scooters. Crutches. Turn on the lights when you go into a dark area. Replace any light bulbs as soon as they burn  out. Set up your furniture so you have a clear path. Avoid moving your furniture around. If any of your floors are uneven, fix them. If there are any pets around you, be aware of where they are. Review your medicines with your doctor. Some medicines can make you feel dizzy. This can increase your chance of falling. Ask your doctor what other things that you can do to help prevent falls. This information is not intended to replace advice given to you by your health care provider. Make sure you discuss any questions you have with your health care provider. Document Released: 08/14/2009 Document Revised: 03/25/2016 Document Reviewed: 11/22/2014 Elsevier Interactive Patient Education  2017 [employer].

## 2022-04-15 NOTE — Progress Notes (Signed)
Subjective:   Alec Campbell is a 51 y.o. male who presents for Medicare Annual/Subsequent preventive examination.  Virtual Visit via Telephone Note  I connected with  Kunio Cummiskey on 04/15/22 at 10:15 AM EDT by telephone and verified that I am speaking with the correct person using two identifiers.  Location: Patient: home Provider: Jenison Persons participating in the virtual visit: patient & mom [place]   I discussed the limitations, risks, security and privacy concerns of performing an evaluation and management service by telephone and the availability of in person appointments. The patient expressed understanding and agreed to proceed.  Interactive audio and video telecommunications were attempted between this nurse and patient, however failed, due to patient having technical difficulties OR patient did not have access to video capability.  We continued and completed visit with audio only.  Some vital signs may be absent or patient reported.   Clemetine Marker, LPN   Review of Systems     Cardiac Risk Factors include: male gender;dyslipidemia     Objective:    There were no vitals filed for this visit. There is no height or weight on file to calculate BMI.     04/05/2019   12:05 PM 02/28/2018    9:23 AM 08/16/2017    2:40 PM 04/21/2017    9:19 AM 01/19/2017    8:35 AM 07/21/2016    9:03 AM 01/19/2016    9:45 AM  Advanced Directives  Does Patient Have a Medical Advance Directive? No No No No No No No  Would patient like information on creating a medical advance directive? No - Guardian declined Yes (MAU/Ambulatory/Procedural Areas - Information given)    No - patient declined information     Current Medications (verified) Outpatient Encounter Medications as of 04/15/2022  Medication Sig   carbamazepine (TEGRETOL) 200 MG tablet TAKE  TWO  TABLETS BY MOUTH IN  THE  MORNING , ONE  AT  LUNCH  AND  ONE  AT  NIGHT   loratadine (CLARITIN) 10 MG tablet Take 1  tablet (10 mg total) by mouth daily.   Multiple Vitamin (MULTIVITAMIN) tablet Take 1 tablet by mouth daily.   PHENobarbital (LUMINAL) 64.8 MG tablet Take 1 tablet (64.8 mg total) by mouth 2 (two) times daily.   polyethylene glycol powder (GLYCOLAX/MIRALAX) 17 GM/SCOOP powder Take 17 g by mouth once. 1 scoop per day   PREVIDENT 5000 SENSITIVE 1.1-5 % GEL Place onto teeth 2 (two) times daily.   rosuvastatin (CRESTOR) 10 MG tablet Take 1 tablet (10 mg total) by mouth daily.   valACYclovir (VALTREX) 1000 MG tablet TAKE 1 TABLET BY MOUTH TWICE DAILY AS NEEDED FOR  OUTBREAK   [DISCONTINUED] SF 5000 PLUS 1.1 % CREA dental cream BRUSH ON TEETH TWICE DAILY. NO EATING OR DRINKING FOR 30 MINUTES AFTER USE   No facility-administered encounter medications on file as of 04/15/2022.    Allergies (verified) Patient has no known allergies.   History: Past Medical History:  Diagnosis Date   Allergy    Moderate mental retardation    Seizures ([place])    Vitamin D deficiency    Past Surgical History:  Procedure Laterality Date   ANKLE SURGERY Right    CLEFT LIP REPAIR     HERNIA REPAIR     Inguinal   TYMPANOSTOMY TUBE PLACEMENT     Family History  Problem Relation Age of Onset   Hypertension Mother    Thyroid disease Mother    CAD Father  Hypertension Father    Hyperlipidemia Father    Healthy Brother    Healthy Sister    Diabetes Brother    COPD Brother    Emphysema Brother    Alcohol abuse Brother    Hypertension Brother    Aneurysm Brother        brain   Social History   Socioeconomic History   Marital status: Single    Spouse name: Not on file   Number of children: 0   Years of education: Handicapped diploma   Highest education level: Not on file  Occupational History    Employer: DISABLED  Tobacco Use   Smoking status: Never   Smokeless tobacco: Never   Tobacco comments:    smoking cessation materials not required  Vaping Use   Vaping Use: Never used  Substance and  Sexual Activity   Alcohol use: No    Alcohol/week: 0.0 standard drinks of alcohol   Drug use: No   Sexual activity: Never  Other Topics Concern   Not on file  Social History Narrative   Moderate mental retardation. Parents are the primary caregiver, he lives with them   Social Determinants of Health   Financial Resource Strain: Low Risk  (04/15/2022)   Overall Financial Resource Strain (CARDIA)    Difficulty of Paying Living Expenses: Not hard at all  Food Insecurity: No Food Insecurity (04/15/2022)   Hunger Vital Sign    Worried About Running Out of Food in the Last Year: Never true    Ran Out of Food in the Last Year: Never true  Transportation Needs: No Transportation Needs (04/15/2022)   PRAPARE - Hydrologist (Medical): No    Lack of Transportation (Non-Medical): No  Physical Activity: Insufficiently Active (04/15/2022)   Exercise Vital Sign    Days of Exercise per Week: 7 days    Minutes of Exercise per Session: 10 min  Stress: No Stress Concern Present (04/15/2022)   [place]    Feeling of Stress : Not at all  Social Connections: Socially Isolated (04/15/2022)   Social Connection and Isolation Panel [NHANES]    Frequency of Communication with Friends and Family: Twice a week    Frequency of Social Gatherings with Friends and Family: Once a week    Attends Religious Services: Never    Marine scientist or Organizations: No    Attends Music therapist: Never    Marital Status: Never married    Tobacco Counseling Counseling given: Not Answered Tobacco comments: smoking cessation materials not required   Clinical Intake:  Pre-visit preparation completed: Yes  Pain : No/denies pain     Nutritional Risks: None Diabetes: No  How often do you need to have someone help you when you read instructions, pamphlets, or other written materials from your doctor or  pharmacy?: 4 - Often    Interpreter Needed?: No  Information entered by :: Clemetine Marker LPN   Activities of Daily Living    04/15/2022   10:24 AM 12/10/2021    9:11 AM  In your present state of health, do you have any difficulty performing the following activities:  Hearing? 0 0  Vision? 0 0  Difficulty concentrating or making decisions? 0 0  Walking or climbing stairs? 0 0  Dressing or bathing? 0 0  Doing errands, shopping? 1 1  Preparing Food and eating ? N   Using the Toilet? N  In the past six months, have you accidently leaked urine? N   Do you have problems with loss of bowel control? N   Managing your Medications? Y   Managing your Finances? Y   Housekeeping or managing your Housekeeping? N     Patient Care Team: [name], MD as PCP - General (Family Medicine)  Indicate any recent Medical Services you may have received from other than [facility] providers in the past year (date may be approximate).     Assessment:   This is a routine wellness examination for [place].  Hearing/Vision screen Hearing Screening - Comments::  Pt denies hearing difficulty Vision Screening - Comments:: Past due for eye exam. Mother says he does not have any complaints about vision.   Dietary issues and exercise activities discussed: Current Exercise Habits: Home exercise routine, Type of exercise: walking, Time (Minutes): 10, Frequency (Times/Week): 7, Weekly Exercise (Minutes/Week): 70, Intensity: Mild, Exercise limited by: None identified   Goals Addressed             This Visit's Progress    DIET - INCREASE WATER INTAKE   On track    Recommend to drink at least 6-8 8oz glasses of water per day.       Depression Screen    04/15/2022   10:23 AM 12/10/2021    9:11 AM 09/18/2021    2:39 PM 06/09/2021    8:53 AM 04/14/2021   10:20 AM 12/10/2020    9:09 AM 06/09/2020   10:30 AM  PHQ 2/9 Scores  PHQ - 2 Score 0 0 0 0 0 0 0  PHQ- 9 Score  0 0    0    Fall Risk    04/15/2022    10:24 AM 12/10/2021    9:11 AM 09/18/2021    2:39 PM 06/09/2021    8:53 AM 04/14/2021   10:23 AM  [place] in the past year? 0 0 0 0 0  Number falls in past yr: 0 0 0 0 0  Injury with Fall? 0 0 0 0 0  Risk for fall due to : No Fall Risks No Fall Risks No Fall Risks  No Fall Risks  Follow up Falls prevention discussed Falls prevention discussed Falls prevention discussed  Falls prevention discussed    FALL RISK PREVENTION PERTAINING TO THE HOME:  Any stairs in or around the home? Yes  If so, are there any without handrails? No  Home free of loose throw rugs in walkways, pet beds, electrical cords, etc? Yes  Adequate lighting in your home to reduce risk of falls? Yes   ASSISTIVE DEVICES UTILIZED TO PREVENT FALLS:  Life alert? No  Use of a cane, walker or w/c? No  Grab bars in the bathroom? No  Shower chair or bench in shower? No  Elevated toilet seat or a handicapped toilet? No   TIMED UP AND GO:  Was the test performed? No . Telephonic visit  Cognitive Function: Cognitive status assessed by direct observation. Patient has current diagnosis of cognitive impairment. Patient is unable to complete screening 6CIT or MMSE.          Immunizations Immunization History  Administered Date(s) Administered   Influenza Split 10/06/2009, 07/21/2012   Influenza, Seasonal, Injecte, Preservative Fre 06/23/2010   Influenza,inj,Quad PF,6+ Mos 07/23/2013, 08/02/2014, 07/18/2015, 07/21/2016, 08/03/2017, 08/07/2018, 08/03/2019, 08/11/2020, 08/26/2021   Influenza-Unspecified 08/02/2014   Moderna Sars-Covid-2 Vaccination 12/05/2019, 01/03/2020, 09/02/2020, 01/18/2021   Tdap 06/23/2010, 12/10/2020   Zoster Recombinat (  Shingrix) 06/09/2020, 08/11/2020    TDAP status: Up to date  Flu Vaccine status: Up to date  Pneumococcal vaccine status: Due, Education has been provided regarding the importance of this vaccine. Advised may receive this vaccine at local pharmacy or Health Dept. Aware  to provide a copy of the vaccination record if obtained from local pharmacy or Health Dept. Verbalized acceptance and understanding.  Covid-19 vaccine status: Completed vaccines  Qualifies for Shingles Vaccine? Yes   Zostavax completed No   Shingrix Completed?: Yes  Screening Tests Health Maintenance  Topic Date Due   COVID-19 Vaccine (5 - Booster for Moderna series) 03/15/2021   INFLUENZA VACCINE  06/01/2022   Fecal DNA (Cologuard)  03/19/2023   TETANUS/TDAP  12/10/2030   Hepatitis C Screening  Completed   Zoster Vaccines- Shingrix  Completed   HPV VACCINES  Aged Out   HIV Screening  Discontinued    Health Maintenance  Health Maintenance Due  Topic Date Due   COVID-19 Vaccine (5 - Booster for Moderna series) 03/15/2021    Colorectal cancer screening: Type of screening: Cologuard. Completed 03/18/20. Repeat every 3 years  Lung Cancer Screening: (Low Dose CT Chest recommended if Age 27-80 years, 30 pack-year currently smoking OR have quit w/in 15years.) does not qualify.   Additional Screening:  Hepatitis C Screening: does qualify; Completed 12/10/21  Vision Screening: Recommended annual ophthalmology exams for early detection of glaucoma and other disorders of the eye. Is the patient up to date with their annual eye exam?  No  Who is the provider or what is the name of the office in which the patient attends annual eye exams? Not established If pt is not established with a provider, would they like to be referred to a provider to establish care? No .   Dental Screening: Recommended annual dental exams for proper oral hygiene  Community Resource Referral / Chronic Care Management: CRR required this visit?  No   CCM required this visit?  No      Plan:     I have personally reviewed and noted the following in the patient's chart:   Medical and social history Use of alcohol, tobacco or illicit drugs  Current medications and supplements including opioid prescriptions.  Patient is not currently taking opioid prescriptions. Functional ability and status Nutritional status Physical activity Advanced directives List of other physicians Hospitalizations, surgeries, and ER visits in previous 12 months Vitals Screenings to include cognitive, depression, and falls Referrals and appointments  In addition, I have reviewed and discussed with patient certain preventive protocols, quality metrics, and best practice recommendations. A written personalized care plan for preventive services as well as general preventive health recommendations were provided to patient.     Clemetine Marker, LPN   [id number]/[id number]   Nurse Notes: none

## 2022-05-28 ENCOUNTER — Other Ambulatory Visit: Payer: Self-pay | Admitting: Family Medicine

## 2022-05-28 DIAGNOSIS — G40909 Epilepsy, unspecified, not intractable, without status epilepticus: Secondary | ICD-10-CM

## 2022-06-08 NOTE — Progress Notes (Unsigned)
Name: Alec Campbell   MRN: [id number]    DOB: [birth year]   Date:06/09/2022       Progress Note  Subjective  Chief Complaint  Follow Up  HPI  Hyponatremia : mild on his last labs at 133 and stable.   Intellectual disability with epilepsy:  he lives with his parents, parents dispense his medications he also needs assistance with transportation, he is unable to manage his finances. Parents also need to be present during his office visits.   He developed seizures at 58 weeks of age , he was born with cleft lips and palate. He was diagnosed with developmental delay as a toddler. He is able to do ADL, but not able to do Instrumental activities of daily living , he does recognizes his medications but parents have to remind him when to take it .   Anemia: last Hemoglobin was low we will recheck it today and add iron studies    Seizure disorder: no symptoms in many years, mother is terrified of stopping his medication because his symptoms were severe. He has a history of  hyponatremia, he saw nephrologist for a period of time, but last sodium was back to normal.   He is now on only two pill of phenobarbital and 4 pills of carbamazepine daily without any side effects. Last levels at goal   Dyslipidemia: not on medication, discussed life style modification, positive family history of heart disease - father had a heart attack in his 58's. LDL was 167 but we started him on statin therapy and last LDL down to 85. No side effects   The 10-year ASCVD risk score (Arnett DK, et al., 2019) is: 2.4%   Values used to calculate the score:     Age: 51 years     Sex: Male     Is Non-Hispanic African American: No     Diabetic: No     Tobacco smoker: No     Systolic Blood Pressure: 170 mmHg     Is BP treated: No     HDL Cholesterol: 46 mg/dL     Total Cholesterol: 151 mg/dL   Inguinal hernia: he had an inguinal hernia repair on right side, mother has noticed it bulging again , does not seem to be cause any  problems at this time, reassurance given, they are aware of symptoms on strangulation   Constipation idiopathic: taking Miralax and helps him have a bowel movements 3-4 times a week, no pain or straining. Unchanged   Patient Active Problem List   Diagnosis Date Noted   Inguinal hernia with gangrene, recurrent bilateral 06/17/2020   Cold sore 02/13/2016   Vitamin D deficiency 07/20/2015   Intellectual disability with epilepsy ([place]) 07/16/2015   Seizure disorder ([place]) 07/16/2015   Seasonal allergic rhinitis 07/16/2015   Dyslipidemia 07/16/2015   Hyponatremia 06/13/2015   Abnormal blood sugar 06/19/2008    Past Surgical History:  Procedure Laterality Date   ANKLE SURGERY Right    CLEFT LIP REPAIR     HERNIA REPAIR     Inguinal   TYMPANOSTOMY TUBE PLACEMENT      Family History  Problem Relation Age of Onset   Hypertension Mother    Thyroid disease Mother    CAD Father    Hypertension Father    Hyperlipidemia Father    Healthy Brother    Healthy Sister    Diabetes Brother    COPD Brother    Emphysema Brother    Alcohol abuse Brother  Hypertension Brother    Aneurysm Brother        brain    Social History   Tobacco Use   Smoking status: Never   Smokeless tobacco: Never   Tobacco comments:    smoking cessation materials not required  Substance Use Topics   Alcohol use: No    Alcohol/week: 0.0 standard drinks of alcohol     Current Outpatient Medications:    carbamazepine (TEGRETOL) 200 MG tablet, TAKE 2 TABLETS BY MOUTH IN THE MORNING, THEN TAKE  1 TABLET AT LUNCH AND TAKE 1 AT NIGHT, Disp: 360 tablet, Rfl: 0   loratadine (CLARITIN) 10 MG tablet, Take 1 tablet (10 mg total) by mouth daily., Disp: 30 tablet, Rfl: 5   Multiple Vitamin (MULTIVITAMIN) tablet, Take 1 tablet by mouth daily., Disp: , Rfl:    PHENobarbital (LUMINAL) 64.8 MG tablet, Take 1 tablet (64.8 mg total) by mouth 2 (two) times daily., Disp: 180 tablet, Rfl: 1   polyethylene glycol powder  (GLYCOLAX/MIRALAX) 17 GM/SCOOP powder, Take 17 g by mouth once. 1 scoop per day, Disp: , Rfl:    PREVIDENT 5000 SENSITIVE 1.1-5 % GEL, Place onto teeth 2 (two) times daily., Disp: , Rfl:    rosuvastatin (CRESTOR) 10 MG tablet, Take 1 tablet (10 mg total) by mouth daily., Disp: 90 tablet, Rfl: 1   valACYclovir (VALTREX) 1000 MG tablet, TAKE 1 TABLET BY MOUTH TWICE DAILY AS NEEDED FOR  OUTBREAK, Disp: 30 tablet, Rfl: 0  No Known Allergies  I personally reviewed active problem list, medication list, allergies, family history, social history, health maintenance with the patient/caregiver today.   ROS  Constitutional: Negative for fever or weight change.  Respiratory: Negative for cough and shortness of breath.   Cardiovascular: Negative for chest pain or palpitations.  Gastrointestinal: Negative for abdominal pain, no bowel changes.  Musculoskeletal: Negative for gait problem or joint swelling.  Skin: Negative for rash.  Neurological: Negative for dizziness or headache.  No other specific complaints in a complete review of systems (except as listed in HPI above).   Objective  Vitals:   06/09/22 0910  BP: 110/70  Pulse: 81  Resp: 16  SpO2: 98%  Weight: 170 lb (77.1 kg)  Height: '5\' 8"'$  (1.727 m)    Body mass index is 25.85 kg/m.  Physical Exam  Constitutional: Patient appears well-developed and well-nourished. No distress.  HEENT: head atraumatic, normocephalic, pupils equal and reactive to light, neck supple, scar from cleft palate repair  Cardiovascular: Normal rate, regular rhythm and normal heart sounds.  No murmur heard. No BLE edema. Pulmonary/Chest: Effort normal and breath sounds normal. No respiratory distress. Abdominal: Soft.  There is no tenderness. Psychiatric: Patient has a normal mood and affect. behavior is normal. Judgment and thought content normal.   PHQ2/9:    06/09/2022    9:10 AM 04/15/2022   10:23 AM 12/10/2021    9:11 AM 09/18/2021    2:39 PM 06/09/2021     8:53 AM  Depression screen PHQ 2/9  Decreased Interest 0 0 0 0 0  Down, Depressed, Hopeless 0 0 0 0 0  PHQ - 2 Score 0 0 0 0 0  Altered sleeping   0 0   Tired, decreased energy   0 0   Change in appetite   0 0   Feeling bad or failure about yourself    0 0   Trouble concentrating   0 0   Moving slowly or fidgety/restless   0 0  Suicidal thoughts   0 0   PHQ-9 Score   0 0     phq 9 is negative   Fall Risk:    06/09/2022    9:10 AM 04/15/2022   10:24 AM 12/10/2021    9:11 AM 09/18/2021    2:39 PM 06/09/2021    8:53 AM  Fall Risk   Falls in the past year? 0 0 0 0 0  Number falls in past yr: 0 0 0 0 0  Injury with Fall? 0 0 0 0 0  Risk for fall due to : No Fall Risks No Fall Risks No Fall Risks No Fall Risks   Follow up Falls prevention discussed Falls prevention discussed Falls prevention discussed Falls prevention discussed       Functional Status Survey: Is the patient deaf or have difficulty hearing?: No Does the patient have difficulty seeing, even when wearing glasses/contacts?: No Does the patient have difficulty concentrating, remembering, or making decisions?: No Does the patient have difficulty walking or climbing stairs?: No Does the patient have difficulty dressing or bathing?: No Does the patient have difficulty doing errands alone such as visiting a doctor's office or shopping?: Yes    Assessment & Plan  1. Intellectual disability with epilepsy ([place])  - carbamazepine (TEGRETOL) 200 MG tablet; TAKE 2 TABLETS BY MOUTH IN THE MORNING, THEN TAKE  1 TABLET AT LUNCH AND TAKE 1 AT NIGHT  Dispense: 360 tablet; Refill: 0 - PHENobarbital (LUMINAL) 64.8 MG tablet; Take 1 tablet (64.8 mg total) by mouth 2 (two) times daily.  Dispense: 180 tablet; Refill: 1  2. Seizure disorder ([facility])  - carbamazepine (TEGRETOL) 200 MG tablet; TAKE 2 TABLETS BY MOUTH IN THE MORNING, THEN TAKE  1 TABLET AT LUNCH AND TAKE 1 AT NIGHT  Dispense: 360 tablet; Refill: 0 - PHENobarbital  (LUMINAL) 64.8 MG tablet; Take 1 tablet (64.8 mg total) by mouth 2 (two) times daily.  Dispense: 180 tablet; Refill: 1  3. Dyslipidemia  - rosuvastatin (CRESTOR) 10 MG tablet; Take 1 tablet (10 mg total) by mouth daily.  Dispense: 90 tablet; Refill: 1  4. Chronic idiopathic constipation   5. Vitamin D deficiency   6. Seasonal allergic rhinitis, unspecified trigger   7. Anemia, unspecified type  - Iron, TIBC and Ferritin Panel - CBC with Differential/Platelet - B12 and Folate Panel

## 2022-06-09 ENCOUNTER — Ambulatory Visit (INDEPENDENT_AMBULATORY_CARE_PROVIDER_SITE_OTHER): Payer: Medicare Other | Admitting: Family Medicine

## 2022-06-09 ENCOUNTER — Encounter: Payer: Self-pay | Admitting: Family Medicine

## 2022-06-09 VITALS — BP 110/70 | HR 81 | Resp 16 | Ht 68.0 in | Wt 170.0 lb

## 2022-06-09 DIAGNOSIS — D649 Anemia, unspecified: Secondary | ICD-10-CM

## 2022-06-09 DIAGNOSIS — G40909 Epilepsy, unspecified, not intractable, without status epilepticus: Secondary | ICD-10-CM | POA: Diagnosis not present

## 2022-06-09 DIAGNOSIS — J302 Other seasonal allergic rhinitis: Secondary | ICD-10-CM

## 2022-06-09 DIAGNOSIS — E785 Hyperlipidemia, unspecified: Secondary | ICD-10-CM | POA: Diagnosis not present

## 2022-06-09 DIAGNOSIS — K5904 Chronic idiopathic constipation: Secondary | ICD-10-CM

## 2022-06-09 DIAGNOSIS — F79 Unspecified intellectual disabilities: Secondary | ICD-10-CM

## 2022-06-09 DIAGNOSIS — E559 Vitamin D deficiency, unspecified: Secondary | ICD-10-CM

## 2022-06-09 MED ORDER — ROSUVASTATIN CALCIUM 10 MG PO TABS: 10.0000 mg | ORAL_TABLET | Freq: Every day | ORAL | 1 refills | Status: DC

## 2022-06-09 MED ORDER — PHENOBARBITAL 64.8 MG PO TABS: 64.8000 mg | ORAL_TABLET | Freq: Two times a day (BID) | ORAL | 1 refills | Status: DC

## 2022-06-09 MED ORDER — CARBAMAZEPINE 200 MG PO TABS: ORAL_TABLET | ORAL | 0 refills | Status: DC

## 2022-06-10 LAB — CBC WITH DIFFERENTIAL/PLATELET
Absolute Monocytes: 538 cells/uL (ref 200–950)
Basophils Absolute: 48 cells/uL (ref 0–200)
Basophils Relative: 0.7 %
Eosinophils Absolute: 476 cells/uL (ref 15–500)
Eosinophils Relative: 6.9 %
HCT: 37.8 % — ABNORMAL LOW (ref 38.5–50.0)
Hemoglobin: 13.1 g/dL — ABNORMAL LOW (ref 13.2–17.1)
Lymphs Abs: 2022 cells/uL (ref 850–3900)
MCH: 29.9 pg (ref 27.0–33.0)
MCHC: 34.7 g/dL (ref 32.0–36.0)
MCV: 86.3 fL (ref 80.0–100.0)
MPV: 13.3 fL — ABNORMAL HIGH (ref 7.5–12.5)
Monocytes Relative: 7.8 %
Neutro Abs: 3816 cells/uL (ref 1500–7800)
Neutrophils Relative %: 55.3 %
Platelets: 177 10*3/uL (ref 140–400)
RBC: 4.38 10*6/uL (ref 4.20–5.80)
RDW: 12.5 % (ref 11.0–15.0)
Total Lymphocyte: 29.3 %
WBC: 6.9 10*3/uL (ref 3.8–10.8)

## 2022-06-10 LAB — IRON,TIBC AND FERRITIN PANEL
%SAT: 36 % (calc) (ref 20–48)
Ferritin: 35 ng/mL — ABNORMAL LOW (ref 38–380)
Iron: 114 ug/dL (ref 50–180)
TIBC: 314 mcg/dL (calc) (ref 250–425)

## 2022-06-10 LAB — B12 AND FOLATE PANEL
Folate: >24 ng/mL
Vitamin B-12: 811 pg/mL (ref 200–1100)

## 2022-06-11 ENCOUNTER — Other Ambulatory Visit: Payer: Self-pay

## 2022-06-11 DIAGNOSIS — D649 Anemia, unspecified: Secondary | ICD-10-CM

## 2022-06-11 DIAGNOSIS — Z79899 Other long term (current) drug therapy: Secondary | ICD-10-CM

## 2022-06-11 DIAGNOSIS — K5904 Chronic idiopathic constipation: Secondary | ICD-10-CM

## 2022-06-11 NOTE — Progress Notes (Signed)
Order placed, caregiver notified.

## 2022-06-14 ENCOUNTER — Telehealth: Payer: Self-pay | Admitting: Family Medicine

## 2022-06-14 NOTE — Telephone Encounter (Signed)
Copied from [place] [id number]. Topic: General - Inquiry >> Jun 14, 2022 10:12 AM Penni Bombard wrote: Reason for CRM: Pt 's mom called asking if some one could call them about Marin's labwork and about the referral to GI.  She is trying to get back in his my chart but can't see result right now  CB@  [phone]

## 2022-06-14 NOTE — Telephone Encounter (Signed)
Spoke with father-Roger Pressley and gave lab results and let him know the referral was placed 06/01/22

## 2022-08-04 ENCOUNTER — Ambulatory Visit (INDEPENDENT_AMBULATORY_CARE_PROVIDER_SITE_OTHER): Payer: Medicare Other

## 2022-08-04 DIAGNOSIS — Z23 Encounter for immunization: Secondary | ICD-10-CM

## 2022-08-28 ENCOUNTER — Other Ambulatory Visit: Payer: Self-pay | Admitting: Family Medicine

## 2022-08-28 DIAGNOSIS — B001 Herpesviral vesicular dermatitis: Secondary | ICD-10-CM

## 2022-10-28 ENCOUNTER — Other Ambulatory Visit: Payer: Self-pay

## 2022-10-28 ENCOUNTER — Encounter: Payer: Self-pay | Admitting: Gastroenterology

## 2022-10-28 ENCOUNTER — Ambulatory Visit (INDEPENDENT_AMBULATORY_CARE_PROVIDER_SITE_OTHER): Payer: Medicare Other | Admitting: Gastroenterology

## 2022-10-28 VITALS — BP 148/91 | HR 70 | Temp 98.3°F | Ht 68.0 in | Wt 175.0 lb

## 2022-10-28 DIAGNOSIS — K5909 Other constipation: Secondary | ICD-10-CM

## 2022-10-28 DIAGNOSIS — Z83719 Family history of colon polyps, unspecified: Secondary | ICD-10-CM

## 2022-10-28 DIAGNOSIS — D508 Other iron deficiency anemias: Secondary | ICD-10-CM

## 2022-10-28 DIAGNOSIS — R194 Change in bowel habit: Secondary | ICD-10-CM

## 2022-10-28 NOTE — Progress Notes (Signed)
Jonathon Bellows MD, MRCP(U.K) [address]  [place]  [place], [place] [address]  Main: [phone]  Fax: [phone]   Gastroenterology Consultation  Referring Provider:     [name], MD Primary Care Physician:  [name], MD Primary Gastroenterologist:  Dr. Jonathon Bellows  Reason for Consultation:    Chronic idiopathic constipation        HPI:   Alec Campbell is a 51 y.o. y/o male referred for consultation & management  by Dr. Ancil Boozer, Drue Stager, MD.     Constipation: Onset long standing for many years but recent change with constipation associated with diarrhea  Consistency sometimes very long wide and consistency of stones  Bleeding none Last colonoscopy never Weight loss weight gain Family history of colon cancer father had colon polyps He has intellectual difficulties and is accompanied by his mother who gives most of the history Laxative use stool softeners and trial of MiraLAX once a day which has helped but not completely Thyroid abnormalities not that he is known to have but mother suffers from hypothyroidism    Rectal bleeding: None Nose bleeds: None Hematemesis or hemoptysis : None Blood in urine : None  06/09/2022: Hemoglobin 13.1, ferritin low at 35 3 years back was 28.  B12 and folate normal.   Past Medical History:  Diagnosis Date   Allergy    Moderate mental retardation    Seizures ([place])    Vitamin D deficiency     Past Surgical History:  Procedure Laterality Date   ANKLE SURGERY Right    CLEFT LIP REPAIR     HERNIA REPAIR     Inguinal   TYMPANOSTOMY TUBE PLACEMENT      Prior to Admission medications   Medication Sig Start Date End Date Taking? Authorizing Provider  carbamazepine (TEGRETOL) 200 MG tablet TAKE 2 TABLETS BY MOUTH IN THE MORNING, THEN TAKE  1 TABLET AT LUNCH AND TAKE 1 AT NIGHT 06/09/22   [name], MD  loratadine (CLARITIN) 10 MG tablet Take 1 tablet (10 mg total) by mouth daily. 01/22/19   [name], MD   Multiple Vitamin (MULTIVITAMIN) tablet Take 1 tablet by mouth daily.    [provider]  PHENobarbital (LUMINAL) 64.8 MG tablet Take 1 tablet (64.8 mg total) by mouth 2 (two) times daily. 06/09/22   [name], MD  polyethylene glycol powder (GLYCOLAX/MIRALAX) 17 GM/SCOOP powder Take 17 g by mouth once. 1 scoop per day    [provider]  PREVIDENT 5000 SENSITIVE 1.1-5 % GEL Place onto teeth 2 (two) times daily. 05/11/21   [provider]  rosuvastatin (CRESTOR) 10 MG tablet Take 1 tablet (10 mg total) by mouth daily. 06/09/22   [name], MD  valACYclovir (VALTREX) 1000 MG tablet TAKE 1 TABLET BY MOUTH TWICE DAILY AS NEEDED FOR  OUTBREAK 08/29/22   [name], MD    Family History  Problem Relation Age of Onset   Hypertension Mother    Thyroid disease Mother    CAD Father    Hypertension Father    Hyperlipidemia Father    Healthy Brother    Healthy Sister    Diabetes Brother    COPD Brother    Emphysema Brother    Alcohol abuse Brother    Hypertension Brother    Aneurysm Brother        brain     Social History   Tobacco Use   Smoking status: Never   Smokeless tobacco: Never   Tobacco comments:  smoking cessation materials not required  Vaping Use   Vaping Use: Never used  Substance Use Topics   Alcohol use: No    Alcohol/week: 0.0 standard drinks of alcohol   Drug use: No    Allergies as of 10/28/2022   (No Known Allergies)    Review of Systems:    All systems reviewed and negative except where noted in HPI.   Physical Exam:  BP (!) 148/91   Pulse 70   Temp 98.3 F (36.8 C) (Oral)   Ht '5\' 8"'$  (1.727 m)   Wt 175 lb (79.4 kg)   BMI 26.61 kg/m  No LMP for male patient. Psych: Appears comfortable General: Well-nourished cooperative suffers from intellectual disability Head:  Normocephalic and atraumatic. Eyes:  Sclera clear, no icterus.   Conjunctiva pink. Ears:  Normal auditory acuity. Neck:  Supple; no masses or  thyromegaly. Abdomen: Right inguinal hernia nonobstructed palpable on coughing. Neurologic: Intellectual disability alert and oriented to space person   Imaging Studies: No results found.  Assessment and Plan:   Alec Campbell is a 51 y.o. y/o male has been referred for Constipation .  View of recent lab suggest a mild iron deficiency anemia.  B12 folate levels were normal.  Suffers from intellectual disability accompanied by his mother and about a colonoscopy recent change in bowel habits father has a history of colon polyps .  History not suggestive of any overt blood loss   Plan   Check  TSH,celiac serology and urine . If iron deficiency would need IV iron  EGD and colonoscopy if negative may need to consider capsule study of the small bowel if needed Continue MiraLAX once a day can increase to twice a day if needed Sample of bowel prep low-volume has been provided to the patient  I have discussed alternative options, risks & benefits,  which include, but are not limited to, bleeding, infection, perforation,respiratory complication & drug reaction.  The patient agrees with this plan & written consent will be obtained.     Follow up in 3 to 4 months  Dr Jonathon Bellows MD,MRCP(U.K)]

## 2022-10-29 LAB — URINALYSIS
Bilirubin, UA: NEGATIVE
Glucose, UA: NEGATIVE
Ketones, UA: NEGATIVE
Leukocytes,UA: NEGATIVE
Nitrite, UA: NEGATIVE
RBC, UA: NEGATIVE
Specific Gravity, UA: 1.02 (ref 1.005–1.030)
Urobilinogen, Ur: 0.2 mg/dL (ref 0.2–1.0)
pH, UA: 6 (ref 5.0–7.5)

## 2022-10-29 LAB — CELIAC DISEASE AB SCREEN W/RFX
Antigliadin Abs, IgA: 3 units (ref 0–19)
IgA/Immunoglobulin A, Serum: 114 mg/dL (ref 90–386)
Transglutaminase IgA: <2 U/mL (ref 0–3)

## 2022-10-29 LAB — TSH: TSH: 1.4 u[IU]/mL (ref 0.450–4.500)

## 2022-10-30 LAB — H. PYLORI BREATH TEST: H pylori Breath Test: POSITIVE — AB

## 2022-10-31 NOTE — Progress Notes (Signed)
Inform h pylori piositive  Suggest clarithromycin 500 mg PO BID, amoxicillin 1 gram BID, omeprazole 20 mg BID all for 14 days.will need repeat H pylori stool antigen to check for eradication after .

## 2022-11-02 ENCOUNTER — Other Ambulatory Visit: Payer: Self-pay | Admitting: Gastroenterology

## 2022-11-02 ENCOUNTER — Telehealth: Payer: Self-pay

## 2022-11-02 DIAGNOSIS — R1084 Generalized abdominal pain: Secondary | ICD-10-CM

## 2022-11-02 MED ORDER — AMOXICILLIN 500 MG PO CAPS: 500.0000 mg | ORAL_CAPSULE | Freq: Two times a day (BID) | ORAL | 0 refills | Status: AC

## 2022-11-02 MED ORDER — OMEPRAZOLE 20 MG PO CPDR: 20.0000 mg | DELAYED_RELEASE_CAPSULE | Freq: Every day | ORAL | 3 refills | Status: DC

## 2022-11-02 MED ORDER — CLARITHROMYCIN 500 MG PO TABS: 500.0000 mg | ORAL_TABLET | Freq: Two times a day (BID) | ORAL | 0 refills | Status: DC

## 2022-11-02 NOTE — Telephone Encounter (Signed)
-----   Message from Jonathon Bellows, MD sent at 10/31/2022 11:16 AM EST ----- Inform h pylori piositive  Suggest clarithromycin 500 mg PO BID, amoxicillin 1 gram BID, omeprazole 20 mg BID all for 14 days.will need repeat H pylori stool antigen to check for eradication after .

## 2022-11-02 NOTE — Telephone Encounter (Signed)
Called patient to let him know that he will need to start taking antibiotics for 14 days and then 6 weeks after done with antibiotics, he will need to do a stool test to make sure that he no longer has H Pylori. Patient understood and had no questions.

## 2022-11-03 ENCOUNTER — Ambulatory Visit: Payer: Self-pay

## 2022-11-03 NOTE — Telephone Encounter (Signed)
Patient's mother-Sharoncalled stating that his pharmacist told them that he is not able to take clarithromycin because it has an interaction with his seizure medication carbamazepine . She wants to know what other antibiotic can he take. Please advise.

## 2022-11-03 NOTE — Telephone Encounter (Signed)
Message from Sharene Skeans sent at 11/03/2022 10:13 AM EST  Summary: med interaction   Was advised that there is a possible interaction with his other med and clarithromycin (BIAXIN) 500 MG tablet [[id number]]/ so [facility] would not filled this RX/ pt wants to know if this is ok to take or if something can be prescribed / please advise / this Rx was prescribed by GI but she wants to know what Dr. Ancil Boozer thinks         Chief Complaint: drug interaction between Clarithromycin and carbamazepine Symptoms: no sx Frequency: prescribed 11/02/2022 Pertinent Negatives: Patient denies any sx- has not taken drug Disposition: '[]'$ ED /'[]'$ Urgent Care (no appt availability in office) / '[]'$ Appointment(In office/virtual)/ '[]'$  [facility]/ '[]'$ Home Care/ '[]'$ Refused Recommended Disposition /'[]'$ [facility]/ '[x]'$  Follow-up with PCP Additional Notes: called [facility] and spoke to pharmacist [place]. She stated that there is an interaction between Clarithromycin and carbamazepine- it can raise the carbamazepine levels in blood. Pt's mother stated she called [facility] and has not heard back. Called  GI and warm transferred call to [facility].  Reason for Disposition  [1] Caller has medicine question about med NOT prescribed by PCP AND [2] triager unable to answer question (e.g., compatibility with other med, storage)  Additional Information  Commented on: [1] Caller has URGENT medicine question about med that PCP or specialist prescribed AND [2] triager unable to answer question    Ordered by GI  Protocols used: Medication Question Call-A-AH

## 2022-11-04 ENCOUNTER — Other Ambulatory Visit: Payer: Self-pay

## 2022-11-04 MED ORDER — TETRACYCLINE HCL 500 MG PO CAPS: 500.0000 mg | ORAL_CAPSULE | Freq: Four times a day (QID) | ORAL | 0 refills | Status: AC

## 2022-11-04 MED ORDER — OMEPRAZOLE 20 MG PO CPDR: 20.0000 mg | DELAYED_RELEASE_CAPSULE | Freq: Two times a day (BID) | ORAL | 0 refills | Status: DC

## 2022-11-04 MED ORDER — METRONIDAZOLE 250 MG PO TABS: 250.0000 mg | ORAL_TABLET | Freq: Four times a day (QID) | ORAL | 0 refills | Status: AC

## 2022-11-04 MED ORDER — BISMUTH SUBSALICYLATE 262 MG PO TABS: 1.0000 | ORAL_TABLET | Freq: Four times a day (QID) | ORAL | 0 refills | Status: AC

## 2022-11-04 NOTE — Telephone Encounter (Signed)
Called patient's mother-Sharon to tell her that his medications had to be changed since the other antibiotic given to him was interacting with his seizure medications. [name] understood and stated that she would go and pick up his new medications.

## 2022-11-22 ENCOUNTER — Telehealth: Payer: Self-pay | Admitting: Gastroenterology

## 2022-11-22 NOTE — Telephone Encounter (Signed)
Patients mother called and states that the patient has finished his medications that Dr. Vicente Males had prescribed and she said that DR. Vicente Males wanted to see the patient after he was finished with those medications. I asked what medications and she said she did not know and that she has already thrown the prescription bottles in the trash. Where do I need to schedule this patient?

## 2022-11-23 NOTE — Telephone Encounter (Signed)
Called Mrs. Sharon-patient's mom to let her know that Dr. Vicente Males would like to check Juquan' stool to make sure that he no longer had H Pylori. Mrs. [name] understood and stated that she would coming in on 12/29/2022 to collect the supplies needed. Mr. Macrae has an appointment with Dr. Vicente Males for 01/27/2023, which I reminded Mrs. [name].

## 2022-12-09 NOTE — Progress Notes (Signed)
Name: Alec Campbell   MRN: [id number]    DOB: Mar 07, 1971   Date:12/10/2022       Progress Note  Subjective  Chief Complaint  Follow Up  HPI  Hyponatremia : mild on his last labs at 133 and stable. We will recheck labs   History of h. Pylori: diagnosed 10/2022 , treated by Dr. Vicente Males and will go back for EGD and colonoscopy   Intellectual disability with epilepsy:  he lives with his parents, parents dispense his medications he also needs assistance with transportation, he is unable to manage his finances. Parents also need to be present during his office visits.   He developed seizures at 38 weeks of age , he was born with cleft lips and palate. He was diagnosed with developmental delay as a toddler. He is able to do ADL, but not able to do Instrumental activities of daily living , he does recognizes his medications but parents have to remind him when to take it . He is stable, his only worry is what he will eat for dinner   Anemia: last Hemoglobin was low , he had h. Pylori, we will recheck labs today    Seizure disorder: no symptoms in many years, mother is terrified of stopping his medication because his symptoms were severe. He has a history of  hyponatremia, he saw nephrologist for a period of time, but last sodium was back to normal.   He is now on only two pill of phenobarbital and 4 pills of carbamazepine daily without any side effects. We will recheck labs   Dyslipidemia: he is now taking statin therapy, no side effects of medications, continue medications   Inguinal hernia: he had an inguinal hernia repair on right side, mother has noticed it bulging again , does not seem to be cause any problems at this time, reassurance given, they are aware of symptoms on strangulation . Unchanged   Constipation idiopathic: taking Miralax and helps him have a bowel movements 3-4 times a week, no pain or straining. Stable   Patient Active Problem List   Diagnosis Date Noted   History of Helicobacter  pylori infection 12/10/2022   History of anemia 12/10/2022   Inguinal hernia with gangrene, recurrent bilateral 06/17/2020   Cold sore 02/13/2016   Vitamin D deficiency 07/20/2015   Intellectual disability with epilepsy ([place]) 07/16/2015   Seizure disorder ([place]) 07/16/2015   Seasonal allergic rhinitis 07/16/2015   Dyslipidemia 07/16/2015   Hyponatremia 06/13/2015   Abnormal blood sugar 06/19/2008    Past Surgical History:  Procedure Laterality Date   ANKLE SURGERY Right    CLEFT LIP REPAIR     HERNIA REPAIR     Inguinal   TYMPANOSTOMY TUBE PLACEMENT      Family History  Problem Relation Age of Onset   Hypertension Mother    Thyroid disease Mother    CAD Father    Hypertension Father    Hyperlipidemia Father    Healthy Brother    Healthy Sister    Diabetes Brother    COPD Brother    Emphysema Brother    Alcohol abuse Brother    Hypertension Brother    Aneurysm Brother        brain    Social History   Tobacco Use   Smoking status: Never   Smokeless tobacco: Never   Tobacco comments:    smoking cessation materials not required  Substance Use Topics   Alcohol use: No    Alcohol/week: 0.0 standard drinks  of alcohol     Current Outpatient Medications:    loratadine (CLARITIN) 10 MG tablet, Take 1 tablet (10 mg total) by mouth daily., Disp: 30 tablet, Rfl: 5   Multiple Vitamin (MULTIVITAMIN) tablet, Take 1 tablet by mouth daily., Disp: , Rfl:    polyethylene glycol powder (GLYCOLAX/MIRALAX) 17 GM/SCOOP powder, Take 17 g by mouth once. 1 scoop per day, Disp: , Rfl:    PREVIDENT 5000 SENSITIVE 1.1-5 % GEL, Place onto teeth 2 (two) times daily., Disp: , Rfl:    valACYclovir (VALTREX) 1000 MG tablet, TAKE 1 TABLET BY MOUTH TWICE DAILY AS NEEDED FOR  OUTBREAK, Disp: 30 tablet, Rfl: 0   carbamazepine (TEGRETOL) 200 MG tablet, TAKE 2 TABLETS BY MOUTH IN THE MORNING, THEN TAKE  1 TABLET AT LUNCH AND TAKE 1 AT NIGHT, Disp: 360 tablet, Rfl: 1   PHENobarbital (LUMINAL) 64.8  MG tablet, Take 1 tablet (64.8 mg total) by mouth 2 (two) times daily., Disp: 180 tablet, Rfl: 1   rosuvastatin (CRESTOR) 10 MG tablet, Take 1 tablet (10 mg total) by mouth daily., Disp: 90 tablet, Rfl: 1  No Known Allergies  I personally reviewed active problem list, medication list, allergies, family history, social history, health maintenance with the patient/caregiver today.   ROS  Ten systems reviewed and is negative except as mentioned in HPI   Objective  Vitals:   12/10/22 1055  BP: 122/82  Pulse: 86  Resp: 16  Temp: 98.1 F (36.7 C)  TempSrc: Oral  SpO2: 98%  Weight: 171 lb 1.6 oz (77.6 kg)  Height: 5' 8"$  (1.727 m)    Body mass index is 26.02 kg/m.  Physical Exam  Constitutional: Patient appears well-developed and well-nourished.  No distress.  HEENT: head atraumatic, normocephalic, pupils equal and reactive to light, neck supple, cleft palate surgery  Cardiovascular: Normal rate, regular rhythm and normal heart sounds.  No murmur heard. No BLE edema. Pulmonary/Chest: Effort normal and breath sounds normal. No respiratory distress. Abdominal: Soft.  There is no tenderness. Psychiatric: Patient has a normal mood and affect. behavior is normal. Judgment and thought content normal.   Recent Results (from the past 2160 hour(s))  Urinalysis     Status: Abnormal   Collection Time: 10/28/22  3:30 PM  Result Value Ref Range   Specific Gravity, UA 1.020 1.005 - 1.030   pH, UA 6.0 5.0 - 7.5   Color, UA Yellow Yellow   Appearance Ur Clear Clear   Leukocytes,UA Negative Negative   Protein,UA 1+ (A) Negative/Trace   Glucose, UA Negative Negative   Ketones, UA Negative Negative   RBC, UA Negative Negative   Bilirubin, UA Negative Negative   Urobilinogen, Ur 0.2 0.2 - 1.0 mg/dL   Nitrite, UA Negative Negative  Celiac Disease Ab Screen w/Rfx     Status: None   Collection Time: 10/28/22  3:30 PM  Result Value Ref Range   Antigliadin Abs, IgA 3 0 - 19 units     Comment:                    Negative                   0 - 19                    Weak Positive             20 - 30  Moderate to Strong Positive   >30    Transglutaminase IgA <2 0 - 3 U/mL    Comment:                               Negative        0 -  3                               Weak Positive   4 - 10                               Positive           >10  Tissue Transglutaminase (tTG) has been identified  as the endomysial antigen.  Studies have demonstr-  ated that endomysial IgA antibodies have over 99%  specificity for gluten sensitive enteropathy.    IgA/Immunoglobulin A, Serum 114 90 - 386 mg/dL  TSH     Status: None   Collection Time: 10/28/22  3:30 PM  Result Value Ref Range   TSH 1.400 0.450 - 4.500 uIU/mL  H. pylori breath test     Status: Abnormal   Collection Time: 10/28/22  3:38 PM  Result Value Ref Range   H pylori Breath Test Positive (A) Negative    PHQ2/9:    12/10/2022   10:57 AM 06/09/2022    9:10 AM 04/15/2022   10:23 AM 12/10/2021    9:11 AM 09/18/2021    2:39 PM  Depression screen PHQ 2/9  Decreased Interest 0 0 0 0 0  Down, Depressed, Hopeless 0 0 0 0 0  PHQ - 2 Score 0 0 0 0 0  Altered sleeping 0   0 0  Tired, decreased energy 0   0 0  Change in appetite 0   0 0  Feeling bad or failure about yourself  0   0 0  Trouble concentrating 0   0 0  Moving slowly or fidgety/restless 0   0 0  Suicidal thoughts 0   0 0  PHQ-9 Score 0   0 0    phq 9 is negative   Fall Risk:    12/10/2022   10:57 AM 06/09/2022    9:10 AM 04/15/2022   10:24 AM 12/10/2021    9:11 AM 09/18/2021    2:39 PM  Fall Risk   Falls in the past year? 0 0 0 0 0  Number falls in past yr:  0 0 0 0  Injury with Fall?  0 0 0 0  Risk for fall due to : No Fall Risks No Fall Risks No Fall Risks No Fall Risks No Fall Risks  Follow up Falls prevention discussed;Education provided;Falls evaluation completed Falls prevention discussed Falls prevention discussed Falls prevention  discussed Falls prevention discussed     Assessment & Plan  1. Intellectual disability with epilepsy ([place])  - carbamazepine (TEGRETOL) 200 MG tablet; TAKE 2 TABLETS BY MOUTH IN THE MORNING, THEN TAKE  1 TABLET AT LUNCH AND TAKE 1 AT NIGHT  Dispense: 360 tablet; Refill: 1 - PHENobarbital (LUMINAL) 64.8 MG tablet; Take 1 tablet (64.8 mg total) by mouth 2 (two) times daily.  Dispense: 180 tablet; Refill: 1  2. Seizure disorder ([facility])  - carbamazepine (TEGRETOL) 200 MG tablet; TAKE 2 TABLETS BY MOUTH IN THE MORNING, THEN TAKE  1 TABLET AT LUNCH AND TAKE 1 AT NIGHT  Dispense: 360 tablet; Refill: 1 - PHENobarbital (LUMINAL) 64.8 MG tablet; Take 1 tablet (64.8 mg total) by mouth 2 (two) times daily.  Dispense: 180 tablet; Refill: 1  3. History of anemia  - CBC with Differential/Platelet - Iron, TIBC and Ferritin Panel  4. History of Helicobacter pylori infection   5. Vitamin D deficiency  Continue supplementation  6. Dyslipidemia  - Lipid panel - rosuvastatin (CRESTOR) 10 MG tablet; Take 1 tablet (10 mg total) by mouth daily.  Dispense: 90 tablet; Refill: 1  7. Hyponatremia   8. Long-term use of high-risk medication  - CBC with Differential/Platelet - COMPLETE METABOLIC PANEL WITH GFR - Phenobarbital level - Carbamazepine Level (Tegretol), total

## 2022-12-10 ENCOUNTER — Encounter: Payer: Self-pay | Admitting: Family Medicine

## 2022-12-10 ENCOUNTER — Ambulatory Visit (INDEPENDENT_AMBULATORY_CARE_PROVIDER_SITE_OTHER): Payer: 59 | Admitting: Family Medicine

## 2022-12-10 VITALS — BP 122/82 | HR 86 | Temp 98.1°F | Resp 16 | Ht 68.0 in | Wt 171.1 lb

## 2022-12-10 DIAGNOSIS — E559 Vitamin D deficiency, unspecified: Secondary | ICD-10-CM | POA: Diagnosis not present

## 2022-12-10 DIAGNOSIS — F79 Unspecified intellectual disabilities: Secondary | ICD-10-CM | POA: Diagnosis not present

## 2022-12-10 DIAGNOSIS — E785 Hyperlipidemia, unspecified: Secondary | ICD-10-CM

## 2022-12-10 DIAGNOSIS — Z8619 Personal history of other infectious and parasitic diseases: Secondary | ICD-10-CM

## 2022-12-10 DIAGNOSIS — Z79899 Other long term (current) drug therapy: Secondary | ICD-10-CM | POA: Diagnosis not present

## 2022-12-10 DIAGNOSIS — G40909 Epilepsy, unspecified, not intractable, without status epilepticus: Secondary | ICD-10-CM

## 2022-12-10 DIAGNOSIS — E871 Hypo-osmolality and hyponatremia: Secondary | ICD-10-CM

## 2022-12-10 DIAGNOSIS — Z862 Personal history of diseases of the blood and blood-forming organs and certain disorders involving the immune mechanism: Secondary | ICD-10-CM | POA: Diagnosis not present

## 2022-12-10 MED ORDER — ROSUVASTATIN CALCIUM 10 MG PO TABS: 10.0000 mg | ORAL_TABLET | Freq: Every day | ORAL | 1 refills | Status: DC

## 2022-12-10 MED ORDER — PHENOBARBITAL 64.8 MG PO TABS: 64.8000 mg | ORAL_TABLET | Freq: Two times a day (BID) | ORAL | 1 refills | Status: DC

## 2022-12-10 MED ORDER — CARBAMAZEPINE 200 MG PO TABS: ORAL_TABLET | ORAL | 1 refills | Status: DC

## 2022-12-17 LAB — CBC WITH DIFFERENTIAL/PLATELET
Absolute Monocytes: 552 cells/uL (ref 200–950)
Basophils Absolute: 50 cells/uL (ref 0–200)
Basophils Relative: 0.8 %
Eosinophils Absolute: 384 cells/uL (ref 15–500)
Eosinophils Relative: 6.2 %
HCT: 38.8 % (ref 38.5–50.0)
Hemoglobin: 13.5 g/dL (ref 13.2–17.1)
Lymphs Abs: 1829 cells/uL (ref 850–3900)
MCH: 30.8 pg (ref 27.0–33.0)
MCHC: 34.8 g/dL (ref 32.0–36.0)
MCV: 88.6 fL (ref 80.0–100.0)
MPV: 13.2 fL — ABNORMAL HIGH (ref 7.5–12.5)
Monocytes Relative: 8.9 %
Neutro Abs: 3385 cells/uL (ref 1500–7800)
Neutrophils Relative %: 54.6 %
Platelets: 178 10*3/uL (ref 140–400)
RBC: 4.38 10*6/uL (ref 4.20–5.80)
RDW: 12.3 % (ref 11.0–15.0)
Total Lymphocyte: 29.5 %
WBC: 6.2 10*3/uL (ref 3.8–10.8)

## 2022-12-17 LAB — COMPLETE METABOLIC PANEL WITH GFR
AG Ratio: 1.9 (calc) (ref 1.0–2.5)
ALT: 13 U/L (ref 9–46)
AST: 14 U/L (ref 10–35)
Albumin: 4.5 g/dL (ref 3.6–5.1)
Alkaline phosphatase (APISO): 118 U/L (ref 35–144)
BUN: 13 mg/dL (ref 7–25)
CO2: 26 mmol/L (ref 20–32)
Calcium: 9.3 mg/dL (ref 8.6–10.3)
Chloride: 102 mmol/L (ref 98–110)
Creat: 0.91 mg/dL (ref 0.70–1.30)
Globulin: 2.4 g/dL (calc) (ref 1.9–3.7)
Glucose, Bld: 56 mg/dL — ABNORMAL LOW (ref 65–99)
Potassium: 3.8 mmol/L (ref 3.5–5.3)
Sodium: 140 mmol/L (ref 135–146)
Total Bilirubin: 0.3 mg/dL (ref 0.2–1.2)
Total Protein: 6.9 g/dL (ref 6.1–8.1)
eGFR: 101 mL/min/{1.73_m2} (ref >=60)

## 2022-12-17 LAB — LIPID PANEL
Cholesterol: 165 mg/dL (ref <200)
HDL: 46 mg/dL (ref >=40)
LDL Cholesterol (Calc): 88 mg/dL (calc)
Non-HDL Cholesterol (Calc): 119 mg/dL (calc) (ref <130)
Total CHOL/HDL Ratio: 3.6 (calc) (ref <5.0)
Triglycerides: 213 mg/dL — ABNORMAL HIGH (ref <150)

## 2022-12-17 LAB — PHENOBARBITAL LEVEL: Phenobarbital, Serum: 25.1 mg/L (ref 15.0–40.0)

## 2022-12-17 LAB — IRON,TIBC AND FERRITIN PANEL
%SAT: 40 % (calc) (ref 20–48)
Ferritin: 34 ng/mL — ABNORMAL LOW (ref 38–380)
Iron: 137 ug/dL (ref 50–180)
TIBC: 341 mcg/dL (calc) (ref 250–425)

## 2022-12-17 LAB — CARBAMAZEPINE LEVEL, TOTAL: Carbamazepine Lvl: 8.3 mg/L (ref 4.0–12.0)

## 2022-12-30 ENCOUNTER — Ambulatory Visit
Admission: RE | Admit: 2022-12-30 | Discharge: 2022-12-30 | Disposition: A | Discharge status: Home / Self Care | Payer: 59 | Attending: Gastroenterology | Admitting: Gastroenterology

## 2022-12-30 ENCOUNTER — Ambulatory Visit: Payer: 59 | Admitting: Anesthesiology

## 2022-12-30 ENCOUNTER — Encounter: Payer: Self-pay | Admitting: Gastroenterology

## 2022-12-30 ENCOUNTER — Encounter
Admission: RE | Disposition: A | Discharge status: Home / Self Care | Payer: Self-pay | Source: Home / Self Care | Attending: Gastroenterology

## 2022-12-30 DIAGNOSIS — D124 Benign neoplasm of descending colon: Secondary | ICD-10-CM | POA: Insufficient documentation

## 2022-12-30 DIAGNOSIS — D120 Benign neoplasm of cecum: Secondary | ICD-10-CM | POA: Insufficient documentation

## 2022-12-30 DIAGNOSIS — R194 Change in bowel habit: Secondary | ICD-10-CM

## 2022-12-30 DIAGNOSIS — F79 Unspecified intellectual disabilities: Secondary | ICD-10-CM | POA: Insufficient documentation

## 2022-12-30 DIAGNOSIS — D128 Benign neoplasm of rectum: Secondary | ICD-10-CM | POA: Insufficient documentation

## 2022-12-30 DIAGNOSIS — K2960 Other gastritis without bleeding: Secondary | ICD-10-CM

## 2022-12-30 DIAGNOSIS — D649 Anemia, unspecified: Secondary | ICD-10-CM

## 2022-12-30 DIAGNOSIS — D126 Benign neoplasm of colon, unspecified: Secondary | ICD-10-CM

## 2022-12-30 DIAGNOSIS — Z83719 Family history of colon polyps, unspecified: Secondary | ICD-10-CM

## 2022-12-30 DIAGNOSIS — B9681 Helicobacter pylori [H. pylori] as the cause of diseases classified elsewhere: Secondary | ICD-10-CM | POA: Diagnosis not present

## 2022-12-30 DIAGNOSIS — D508 Other iron deficiency anemias: Secondary | ICD-10-CM

## 2022-12-30 DIAGNOSIS — R569 Unspecified convulsions: Secondary | ICD-10-CM | POA: Diagnosis not present

## 2022-12-30 DIAGNOSIS — D122 Benign neoplasm of ascending colon: Secondary | ICD-10-CM | POA: Insufficient documentation

## 2022-12-30 DIAGNOSIS — K5909 Other constipation: Secondary | ICD-10-CM

## 2022-12-30 DIAGNOSIS — D509 Iron deficiency anemia, unspecified: Secondary | ICD-10-CM | POA: Diagnosis not present

## 2022-12-30 DIAGNOSIS — K2970 Gastritis, unspecified, without bleeding: Secondary | ICD-10-CM | POA: Diagnosis not present

## 2022-12-30 DIAGNOSIS — K2950 Unspecified chronic gastritis without bleeding: Secondary | ICD-10-CM | POA: Insufficient documentation

## 2022-12-30 DIAGNOSIS — E785 Hyperlipidemia, unspecified: Secondary | ICD-10-CM | POA: Diagnosis not present

## 2022-12-30 DIAGNOSIS — K2900 Acute gastritis without bleeding: Secondary | ICD-10-CM | POA: Diagnosis not present

## 2022-12-30 DIAGNOSIS — K635 Polyp of colon: Secondary | ICD-10-CM | POA: Diagnosis not present

## 2022-12-30 HISTORY — PX: COLONOSCOPY: SHX5424

## 2022-12-30 HISTORY — PX: ESOPHAGOGASTRODUODENOSCOPY (EGD) WITH PROPOFOL: SHX5813

## 2022-12-30 SURGERY — ESOPHAGOGASTRODUODENOSCOPY (EGD) WITH PROPOFOL
Anesthesia: General

## 2022-12-30 MED ORDER — DEXMEDETOMIDINE HCL IN NACL 80 MCG/20ML IV SOLN
INTRAVENOUS | Status: DC | PRN
  Administered 2022-12-30: 4 ug via BUCCAL
  Administered 2022-12-30: 8 ug via BUCCAL

## 2022-12-30 MED ORDER — SODIUM CHLORIDE 0.9 % IV SOLN: INTRAVENOUS | Status: DC

## 2022-12-30 MED ORDER — LIDOCAINE HCL (CARDIAC) PF 100 MG/5ML IV SOSY
PREFILLED_SYRINGE | INTRAVENOUS | Status: DC | PRN
  Administered 2022-12-30: 40 mg via INTRAVENOUS

## 2022-12-30 MED ORDER — PROPOFOL 10 MG/ML IV BOLUS
INTRAVENOUS | Status: DC | PRN
  Administered 2022-12-30: 50 mg via INTRAVENOUS

## 2022-12-30 MED ORDER — MIDAZOLAM HCL 2 MG/2ML IJ SOLN
INTRAMUSCULAR | Status: AC
  Administered 2022-12-30: 2 mg via INTRAVENOUS
  Filled 2022-12-30: qty 2

## 2022-12-30 MED ORDER — MIDAZOLAM HCL 2 MG/2ML IJ SOLN: 2.0000 mg | Freq: Once | INTRAMUSCULAR | Status: AC

## 2022-12-30 MED ORDER — EPHEDRINE SULFATE-NACL 50-0.9 MG/10ML-% IV SOSY
PREFILLED_SYRINGE | INTRAVENOUS | Status: DC | PRN
  Administered 2022-12-30: 10 mg via INTRAVENOUS

## 2022-12-30 MED ORDER — EPHEDRINE 5 MG/ML INJ
INTRAVENOUS | Status: AC
  Filled 2022-12-30: qty 5

## 2022-12-30 MED ORDER — PROPOFOL 500 MG/50ML IV EMUL
INTRAVENOUS | Status: DC | PRN
  Administered 2022-12-30: 150 ug/kg/min via INTRAVENOUS

## 2022-12-30 NOTE — Op Note (Signed)
[facility] Gastroenterology Patient Name: Alec Campbell Procedure Date: 12/30/2022 11:23 AM MRN: [id number] Account #: [id number] Date of Birth: 11-04-1970 Admit Type: Outpatient Age: 52 Room: [facility] ENDO ROOM 1 Gender: Male Note Status: Finalized Instrument Name: Upper Endoscope [id number] Procedure:             Upper GI endoscopy Indications:           Iron deficiency anemia Providers:             Jonathon Bellows MD, MD Referring MD:          Bethena Roys. Sowles, MD (Referring MD) Medicines:             Monitored Anesthesia Care Complications:         No immediate complications. Procedure:             Pre-Anesthesia Assessment:                        - Prior to the procedure, a History and Physical was                         performed, and patient medications, allergies and                         sensitivities were reviewed. The patient's tolerance                         of previous anesthesia was reviewed.                        - The risks and benefits of the procedure and the                         sedation options and risks were discussed with the                         patient. All questions were answered and informed                         consent was obtained.                        - ASA Grade Assessment: II - A patient with mild                         systemic disease.                        After obtaining informed consent, the endoscope was                         passed under direct vision. Throughout the procedure,                         the patient's blood pressure, pulse, and oxygen                         saturations were monitored continuously. The Endoscope  was introduced through the mouth, and advanced to the                         third part of duodenum. The upper GI endoscopy was                         accomplished with ease. The patient tolerated the                         procedure well. Findings:      The  esophagus was normal.      The examined duodenum was normal.      Diffuse moderate inflammation characterized by congestion (edema),       erosions and erythema was found on the greater curvature of the gastric       antrum. Biopsies were taken with a cold forceps for histology.      The cardia and gastric fundus were normal on retroflexion. Impression:            - Normal esophagus.                        - Normal examined duodenum.                        - Gastritis. Biopsied. Recommendation:        - Await pathology results.                        - Perform a colonoscopy today. Procedure Code(s):     --- Professional ---                        [phone], Esophagogastroduodenoscopy, flexible,                         transoral; with biopsy, single or multiple Diagnosis Code(s):     --- Professional ---                        K29.70, Gastritis, unspecified, without bleeding                        D50.9, Iron deficiency anemia, unspecified CPT copyright 2022 American Medical Association. All rights reserved. The codes documented in this report are preliminary and upon coder review may  be revised to meet current compliance requirements. Jonathon Bellows, MD Jonathon Bellows MD, MD 12/30/2022 11:37:40 AM This report has been signed electronically. Number of Addenda: 0 Note Initiated On: 12/30/2022 11:23 AM Estimated Blood Loss:  Estimated blood loss: none.      [facility]

## 2022-12-30 NOTE — Op Note (Addendum)
[facility] Gastroenterology Patient Name: Alec Campbell Procedure Date: 12/30/2022 11:22 AM MRN: [id number] Account #: [id number] Date of Birth: 17-Jun-1971 Admit Type: Outpatient Age: 52 Room: [facility] ENDO ROOM 1 Gender: Male Note Status: Finalized Instrument Name: Jasper Riling [id number] Procedure:             Colonoscopy Indications:           Iron deficiency anemia Providers:             Jonathon Bellows MD, MD Referring MD:          Bethena Roys. Sowles, MD (Referring MD) Medicines:             Monitored Anesthesia Care Complications:         No immediate complications. Procedure:             Pre-Anesthesia Assessment:                        - Prior to the procedure, a History and Physical was                         performed, and patient medications, allergies and                         sensitivities were reviewed. The patient's tolerance                         of previous anesthesia was reviewed.                        - The risks and benefits of the procedure and the                         sedation options and risks were discussed with the                         patient. All questions were answered and informed                         consent was obtained.                        - ASA Grade Assessment: II - A patient with mild                         systemic disease.                        After obtaining informed consent, the colonoscope was                         passed under direct vision. Throughout the procedure,                         the patient's blood pressure, pulse, and oxygen                         saturations were monitored continuously. The                         Colonoscope was introduced through  the anus and                         advanced to the the cecum, identified by the                         appendiceal orifice. The colonoscopy was performed                         with ease. The patient tolerated the procedure well.                          The quality of the bowel preparation was good. The                         ileocecal valve, appendiceal orifice, and rectum were                         photographed. Findings:      The perianal and digital rectal examinations were normal.      A 12 mm polyp was found in the cecum. The polyp was sessile. The polyp       was removed with a cold snare. Resection and retrieval were complete. To       prevent bleeding after the polypectomy, three hemostatic clips were       successfully placed. There was no bleeding during, or at the end, of the       procedure.      A 20 mm polyp was found in the cecum. The polyp was semi-sessile.       Preparations were made for mucosal resection. Demarcation of the lesion       was performed with narrow band imaging to clearly identify the       boundaries of the lesion. Eleview was injected to raise the lesion.       Snare mucosal resection was performed. Resection and retrieval were       complete. Resected tissue margins were examined and clear of polyp       tissue. To prevent bleeding after mucosal resection, three hemostatic       clips were successfully placed. There was no bleeding during, or at the       end, of the procedure.      A large polypoid lesion was found in the proximal ascending colon. The       lesion was lateral spreading. No bleeding was present. Mucosa was       biopsied with a cold forceps for histology. One specimen bottle was sent       to pathology. Area was tattooed with an injection of Spot (carbon black).      A 7 mm polyp was found in the descending colon. The polyp was sessile.       The polyp was removed with a cold snare. Resection and retrieval were       complete. To prevent bleeding after the polypectomy, one hemostatic clip       was successfully placed. There was no bleeding at the end of the       procedure.      A 5 mm polyp was found in the rectum. The polyp was sessile. The polyp       was removed with a  cold  snare. Resection and retrieval were complete.      The exam was otherwise without abnormality on direct and retroflexion       views. Impression:            - One 12 mm polyp in the cecum, removed with a cold                         snare. Resected and retrieved. Clips were placed.                        - One 20 mm polyp in the cecum, removed with mucosal                         resection. Resected and retrieved. Clips were placed.                        - Rule out malignancy, polypoid lesion in the proximal                         ascending colon. Biopsied. Tattooed.                        - One 7 mm polyp in the descending colon, removed with                         a cold snare. Resected and retrieved. Clip was placed.                        - One 5 mm polyp in the rectum, removed with a cold                         snare. Resected and retrieved.                        - The examination was otherwise normal on direct and                         retroflexion views.                        - Mucosal resection was performed. Resection and                         retrieval were complete. Recommendation:        - Discharge patient to home (with escort).                        - Resume previous diet.                        - Continue present medications.                        - Await pathology results.                        - Repeat colonoscopy for surveillance based on  pathology results. Procedure Code(s):     --- Professional ---                        [phone], Colonoscopy, flexible; with endoscopic mucosal                         resection                        45385, 72, Colonoscopy, flexible; with removal of                         tumor(s), polyp(s), or other lesion(s) by snare                         technique                        45381, 37, Colonoscopy, flexible; with directed                         submucosal injection(s), any substance Diagnosis  Code(s):     --- Professional ---                        D12.0, Benign neoplasm of cecum                        D12.4, Benign neoplasm of descending colon                        D12.8, Benign neoplasm of rectum                        D49.0, Neoplasm of unspecified behavior of digestive                         system                        D50.9, Iron deficiency anemia, unspecified CPT copyright 2022 American Medical Association. All rights reserved. The codes documented in this report are preliminary and upon coder review may  be revised to meet current compliance requirements. Jonathon Bellows, MD Jonathon Bellows MD, MD 12/30/2022 12:16:32 PM This report has been signed electronically. Number of Addenda: 0 Note Initiated On: 12/30/2022 11:22 AM Scope Withdrawal Time: 0 hours 29 minutes 57 seconds  Total Procedure Duration: 0 hours 32 minutes 45 seconds  Estimated Blood Loss:  Estimated blood loss: none.      [facility]

## 2022-12-30 NOTE — Anesthesia Postprocedure Evaluation (Signed)
Anesthesia Post Note  Patient: Alec Campbell  Procedure(s) Performed: ESOPHAGOGASTRODUODENOSCOPY (EGD) WITH PROPOFOL COLONOSCOPY  Patient location during evaluation: PACU Anesthesia Type: General Level of consciousness: awake and alert, oriented and patient cooperative Pain management: pain level controlled Vital Signs Assessment: post-procedure vital signs reviewed and stable Respiratory status: spontaneous breathing, nonlabored ventilation and respiratory function stable Cardiovascular status: blood pressure returned to baseline and stable Postop Assessment: adequate PO intake Anesthetic complications: no   No notable events documented.   Last Vitals:  Vitals:   12/30/22 1229 12/30/22 1239  BP: 92/73 110/78  Pulse: 66   Resp: 18   Temp:    SpO2: 97%     Last Pain:  Vitals:   12/30/22 1219  TempSrc: Temporal  PainSc: Asleep                 Darrin Nipper

## 2022-12-30 NOTE — H&P (Signed)
Jonathon Bellows, MD [address], [place], [place], [place], [address] [id number] [place], [place], [place], [address] Phone: [phone]  Fax: [phone]  Primary Care Physician:  [name], MD   Pre-Procedure History & Physical: HPI:  Alec Campbell is a 52 y.o. male is here for an endoscopy and colonoscopy    Past Medical History:  Diagnosis Date   Allergy    Moderate mental retardation    Seizures ([place])    Vitamin D deficiency     Past Surgical History:  Procedure Laterality Date   ANKLE SURGERY Right    CLEFT LIP REPAIR     HERNIA REPAIR     Inguinal   TYMPANOSTOMY TUBE PLACEMENT      Prior to Admission medications   Medication Sig Start Date End Date Taking? Authorizing Provider  carbamazepine (TEGRETOL) 200 MG tablet TAKE 2 TABLETS BY MOUTH IN THE MORNING, THEN TAKE  1 TABLET AT LUNCH AND TAKE 1 AT NIGHT 12/10/22  Yes Sowles, Drue Stager, MD  PHENobarbital (LUMINAL) 64.8 MG tablet Take 1 tablet (64.8 mg total) by mouth 2 (two) times daily. 12/10/22  Yes Sowles, Drue Stager, MD  rosuvastatin (CRESTOR) 10 MG tablet Take 1 tablet (10 mg total) by mouth daily. 12/10/22  Yes Sowles, Drue Stager, MD  loratadine (CLARITIN) 10 MG tablet Take 1 tablet (10 mg total) by mouth daily. 01/22/19   [name], MD  Multiple Vitamin (MULTIVITAMIN) tablet Take 1 tablet by mouth daily.    [provider]  polyethylene glycol powder (GLYCOLAX/MIRALAX) 17 GM/SCOOP powder Take 17 g by mouth once. 1 scoop per day    [provider]  PREVIDENT 5000 SENSITIVE 1.1-5 % GEL Place onto teeth 2 (two) times daily. 05/11/21   [provider]  valACYclovir (VALTREX) 1000 MG tablet TAKE 1 TABLET BY MOUTH TWICE DAILY AS NEEDED FOR  OUTBREAK 08/29/22   [name], MD    Allergies as of 10/28/2022   (No Known Allergies)    Family History  Problem Relation Age of Onset   Hypertension Mother    Thyroid disease Mother    CAD Father    Hypertension Father     Hyperlipidemia Father    Healthy Brother    Healthy Sister    Diabetes Brother    COPD Brother    Emphysema Brother    Alcohol abuse Brother    Hypertension Brother    Aneurysm Brother        brain    Social History   Socioeconomic History   Marital status: Single    Spouse name: Not on file   Number of children: 0   Years of education: Handicapped diploma   Highest education level: Not on file  Occupational History    Employer: DISABLED  Tobacco Use   Smoking status: Never   Smokeless tobacco: Never   Tobacco comments:    smoking cessation materials not required  Vaping Use   Vaping Use: Never used  Substance and Sexual Activity   Alcohol use: No    Alcohol/week: 0.0 standard drinks of alcohol   Drug use: No   Sexual activity: Never  Other Topics Concern   Not on file  Social History Narrative   Moderate mental retardation. Parents are the primary caregiver, he lives with them   Social Determinants of Health   Financial Resource Strain: Low Risk  (04/15/2022)   Overall Financial Resource Strain (CARDIA)    Difficulty of Paying Living Expenses: Not hard at  all  Food Insecurity: No Food Insecurity (04/15/2022)   Hunger Vital Sign    Worried About Running Out of Food in the Last Year: Never true    Ran Out of Food in the Last Year: Never true  Transportation Needs: No Transportation Needs (04/15/2022)   PRAPARE - Hydrologist (Medical): No    Lack of Transportation (Non-Medical): No  Physical Activity: Insufficiently Active (04/15/2022)   Exercise Vital Sign    Days of Exercise per Week: 7 days    Minutes of Exercise per Session: 10 min  Stress: No Stress Concern Present (04/15/2022)   [place]    Feeling of Stress : Not at all  Social Connections: Socially Isolated (04/15/2022)   Social Connection and Isolation Panel [NHANES]    Frequency of Communication with Friends  and Family: Twice a week    Frequency of Social Gatherings with Friends and Family: Once a week    Attends Religious Services: Never    Marine scientist or Organizations: No    Attends Archivist Meetings: Never    Marital Status: Never married  Intimate Partner Violence: Not At Risk (04/15/2022)   Humiliation, Afraid, Rape, and Kick questionnaire    Fear of Current or Ex-Partner: No    Emotionally Abused: No    Physically Abused: No    Sexually Abused: No    Review of Systems: See HPI, otherwise negative ROS  Physical Exam: BP 136/88   Pulse 71   Temp (!) 96 F (35.6 C) (Temporal)   Resp 17   Ht '5\' 8"'$  (1.727 m)   Wt 75.3 kg   SpO2 100%   BMI 25.24 kg/m  General:   Alert,  pleasant and cooperative in NAD Head:  Normocephalic and atraumatic. Neck:  Supple; no masses or thyromegaly. Lungs:  Clear throughout to auscultation, normal respiratory effort.    Heart:  +S1, +S2, Regular rate and rhythm, No edema. Abdomen:  Soft, nontender and nondistended. Normal bowel sounds, without guarding, and without rebound.   Neurologic:  Alert and  oriented x0.  Impression/Plan: Alec Campbell is here for an endoscopy and colonoscopy  to be performed for  evaluation of iron deficiency anemia    Risks, benefits, limitations, and alternatives regarding endoscopy have been reviewed with the patients mother.  Questions have been answered.  All parties agreeable.   Jonathon Bellows, MD  12/30/2022, 11:21 AM

## 2022-12-30 NOTE — Transfer of Care (Signed)
Immediate Anesthesia Transfer of Care Note  Patient: Login [place]  Procedure(s) Performed: Procedure(s) with comments: ESOPHAGOGASTRODUODENOSCOPY (EGD) WITH PROPOFOL (N/A) COLONOSCOPY (N/A) - SPECIAL NEEDS - MOTHER NEEDS TO ACCOMPANY HIM.  Patient Location: PACU and Endoscopy Unit  Anesthesia Type:General  Level of Consciousness: sedated  Airway & Oxygen Therapy: Patient Spontanous Breathing and Patient connected to nasal cannula oxygen  Post-op Assessment: Report given to RN and Post -op Vital signs reviewed and stable  Post vital signs: Reviewed and stable  Last Vitals:  Vitals:   12/30/22 1006 12/30/22 1219  BP: 136/88 103/68  Pulse: 71 61  Resp: 17 13  Temp: (!) 35.6 C (!) 35.6 C  SpO2: [id number]    Complications: No apparent anesthesia complications

## 2022-12-30 NOTE — Anesthesia Preprocedure Evaluation (Addendum)
Anesthesia Evaluation  Patient identified by MRN, date of birth, ID band Patient awake    Reviewed: Allergy & Precautions, NPO status , Patient's Chart, lab work & pertinent test results  History of Anesthesia Complications Negative for: history of anesthetic complications  Airway Mallampati: IV   Neck ROM: Full    Dental  (+) Partial Upper   Pulmonary neg pulmonary ROS   Pulmonary exam normal breath sounds clear to auscultation       Cardiovascular Exercise Tolerance: Good negative cardio ROS Normal cardiovascular exam Rhythm:Regular Rate:Normal     Neuro/Psych Seizures - (last seizure years ago), Well Controlled,  Intellectual disability    GI/Hepatic negative GI ROS,,,  Endo/Other  negative endocrine ROS    Renal/GU negative Renal ROS     Musculoskeletal   Abdominal   Peds  Hematology  (+) Blood dyscrasia, anemia   Anesthesia Other Findings   Reproductive/Obstetrics                             Anesthesia Physical Anesthesia Plan  ASA: 2  Anesthesia Plan: General   Post-op Pain Management:    Induction: Intravenous  PONV Risk Score and Plan: 2 and Propofol infusion, TIVA and Treatment may vary due to age or medical condition  Airway Management Planned: Natural Airway  Additional Equipment:   Intra-op Plan:   Post-operative Plan:   Informed Consent: I have reviewed the patients History and Physical, chart, labs and discussed the procedure including the risks, benefits and alternatives for the proposed anesthesia with the patient or authorized representative who has indicated his/her understanding and acceptance.     Consent reviewed with POA  Plan Discussed with: CRNA  Anesthesia Plan Comments: (History and consent obtained from patient's mother at bedside.  LMA/GETA backup discussed.  Patient's mother consented for risks of anesthesia including but not limited to:   - adverse reactions to medications - damage to eyes, teeth, lips or other oral mucosa - nerve damage due to positioning  - sore throat or hoarseness - damage to heart, brain, nerves, lungs, other parts of body or loss of life  Informed patient's mother about role of CRNA in peri- and intra-operative care; she voiced understanding.)        Anesthesia Quick Evaluation

## 2022-12-30 NOTE — Anesthesia Procedure Notes (Signed)
Date/Time: 12/30/2022 11:35 AM  Performed by: Doreen Salvage, CRNAPre-anesthesia Checklist: Patient identified, Emergency Drugs available, Suction available and Patient being monitored Patient Re-evaluated:Patient Re-evaluated prior to induction Oxygen Delivery Method: Nasal cannula Induction Type: IV induction Dental Injury: Teeth and Oropharynx as per pre-operative assessment  Comments: Nasal cannula with etCO2 monitoring

## 2022-12-31 ENCOUNTER — Encounter: Payer: Self-pay | Admitting: Gastroenterology

## 2022-12-31 LAB — SURGICAL PATHOLOGY

## 2023-01-02 ENCOUNTER — Encounter: Payer: Self-pay | Admitting: Family Medicine

## 2023-01-02 DIAGNOSIS — K635 Polyp of colon: Secondary | ICD-10-CM | POA: Insufficient documentation

## 2023-01-02 DIAGNOSIS — B9681 Helicobacter pylori [H. pylori] as the cause of diseases classified elsewhere: Secondary | ICD-10-CM | POA: Insufficient documentation

## 2023-01-03 NOTE — Progress Notes (Signed)
Maritza inform patient that there was an abnormal mass like area that I took biopsies and its 1 step away from cancer and needs surgery .   1. Refer to Dr Darrall Dears in oncology to help navigate with scans tests , surgery and if needed further treatment at cancer center . I have C.c her on this patient   2. Refer to Dr Dahlia Byes or Dr Hampton Abbot for surgery discussion   3. I am on vacation in a few days if patient insists can see Wednesday afternoon   Dr Jonathon Bellows MD,MRCP [facility]) Gastroenterology/Hepatology Pager: [phone]

## 2023-01-11 ENCOUNTER — Other Ambulatory Visit: Payer: Self-pay

## 2023-01-11 DIAGNOSIS — K6389 Other specified diseases of intestine: Secondary | ICD-10-CM

## 2023-01-12 ENCOUNTER — Telehealth: Payer: Self-pay | Admitting: *Deleted

## 2023-01-12 NOTE — Telephone Encounter (Signed)
Nurse placed call to patient to review appointment details for upcoming new oncology consultation visit. Nurse placed call to patients mother [name] to confirm appointment details. Patients mother confirmed appointment details and time, states she knows where the clinic is located. Patients mother is aware of mandatory mask in place and only 1 visitor allowed for each patient during visit. Patients mother expressed concern that patient is not able to make decisions for himself and she will need to be with him for entire time. Mother reassured that she can be with patient for entire visit time. Nurse answered questions patients mother had regarding reason for referral and what to expect during visit.

## 2023-01-13 ENCOUNTER — Encounter: Payer: Self-pay | Admitting: Internal Medicine

## 2023-01-13 ENCOUNTER — Inpatient Hospital Stay: Payer: 59 | Attending: Internal Medicine | Admitting: Internal Medicine

## 2023-01-13 ENCOUNTER — Inpatient Hospital Stay: Payer: 59

## 2023-01-13 VITALS — BP 128/79 | HR 73 | Temp 97.9°F | Resp 18 | Wt 172.3 lb

## 2023-01-13 DIAGNOSIS — Z79899 Other long term (current) drug therapy: Secondary | ICD-10-CM | POA: Insufficient documentation

## 2023-01-13 DIAGNOSIS — F71 Moderate intellectual disabilities: Secondary | ICD-10-CM | POA: Diagnosis not present

## 2023-01-13 DIAGNOSIS — Z862 Personal history of diseases of the blood and blood-forming organs and certain disorders involving the immune mechanism: Secondary | ICD-10-CM

## 2023-01-13 DIAGNOSIS — K639 Disease of intestine, unspecified: Secondary | ICD-10-CM | POA: Diagnosis not present

## 2023-01-13 DIAGNOSIS — K6389 Other specified diseases of intestine: Secondary | ICD-10-CM | POA: Insufficient documentation

## 2023-01-13 DIAGNOSIS — R569 Unspecified convulsions: Secondary | ICD-10-CM | POA: Insufficient documentation

## 2023-01-13 DIAGNOSIS — D509 Iron deficiency anemia, unspecified: Secondary | ICD-10-CM | POA: Diagnosis not present

## 2023-01-13 NOTE — Addendum Note (Signed)
Addended byJane Canary on: 01/13/2023 01:11 PM   Modules accepted: Orders

## 2023-01-13 NOTE — Progress Notes (Signed)
[facility] NOTE  Patient Care Team: [name], MD as PCP - General (Family Medicine) Clent Jacks, RN as Oncology Nurse Navigator Jane Canary, MD as Consulting Physician (Oncology)  REFERRING PROVIDER: Dr. Vicente Males  REASON FOR REFFERAL: Ascending colon mass  CANCER STAGING   Cancer Staging  No matching staging information was found for the patient.  ASSESSMENT & PLAN:  Alec Campbell 52 y.o. male with pmh of intellectual disability, seizures was referred to medical oncology for ascending colon mass detected on colonoscopy.  # Ascending colon mass -Underwent colonoscopy for intermittent iron deficiency anemia. Colonoscopy done by Dr. Vicente Males on 12/30/2022 showed 12 mm and 20 mm polyp in the cecum, a large polypoid lesion was found in the proximal ascending colon was lateral spreading, mucosa was biopsied, 7 mm polyp in the descending colon and 5 mm polyp in the rectum.  Endoscopic showed gastritis.  - Pathology showed tubular adenomas.  Ascending colon mass biopsy also showed high-grade dysplasia.  Correlation with clinical impression required as these findings may not be representative of the target lesion.  -I discussed with the mother who is the caregiver in detail about the colonoscopy and pathology report that it is showing high-grade dysplasia which is a precancerous lesion and has potential to transform into malignancy.  I will obtain CT chest abdomen and pelvis with contrast to rule out any spread.  He is scheduled to see Dr. Dahlia Byes on March 25 to discuss about surgery.  I will follow-up with him after surgery to discuss the path report and to assess the need for any adjuvant treatment.  # Seizures -Stable.  On phenobarbital and carbamazepine.  Orders Placed This Encounter  Procedures   CT CHEST ABDOMEN PELVIS W CONTRAST    Standing Status:   Future    Standing Expiration Date:   01/13/2024    Order Specific Question:   If indicated for the ordered  procedure, I authorize the administration of contrast media per Radiology protocol    Answer:   Yes    Order Specific Question:   Does the patient have a contrast media/X-ray dye allergy?    Answer:   No    Order Specific Question:   Preferred imaging location?    Answer:   [facility]    Order Specific Question:   Is Oral Contrast requested for this exam?    Answer:   Yes, Per Radiology protocol   RTC in 8 weeks for MD visit, labs, to discuss path report.  The total time spent in the appointment was 55 minutes encounter with patients including review of chart and various tests results, discussions about plan of care and coordination of care plan   All questions were answered. The patient knows to call the clinic with any problems, questions or concerns. No barriers to learning was detected.  Jane Canary, MD 3/14/202412:57 PM   HISTORY OF PRESENTING ILLNESS:  Alec Campbell 52 y.o. male with pmh of intellectual disability, seizures was referred to medical oncology for ascending colon mass detected on colonoscopy.  History obtained from mother who is his caregiver.  Patient has been having issues with intermittent iron deficiency anemia. Colonoscopy done by Dr. Vicente Males on 12/30/2022 showed 12 mm and 20 mm polyp in the cecum, a large polypoid lesion lesion was found in the proximal ascending colon was lateral spreading, mucosa was biopsied, 7 mm polyp in the descending colon and 5 mm polyp in the rectum.  Endoscopic showed gastritis.  Pathology showed  tubular adenoma.  Ascending colon mass biopsy it also showed high-grade dysplasia.  Correlation with clinical impression required as these findings may not be representative of the target lesion.  Patient reports feeling well overall.  Denies any weight loss or changes in appetite.  He has chronic constipation otherwise denies any changes with the bowel movements.  I have reviewed his chart and materials related to his cancer extensively  and collaborated history with the patient. Summary of oncologic history is as follows: Oncology History   No history exists.    MEDICAL HISTORY:  Past Medical History:  Diagnosis Date   Allergy    Moderate mental retardation    Seizures ([place])    Vitamin D deficiency     SURGICAL HISTORY: Past Surgical History:  Procedure Laterality Date   ANKLE SURGERY Right    CLEFT LIP REPAIR     COLONOSCOPY N/A 12/30/2022   Procedure: COLONOSCOPY;  Surgeon: Jonathon Bellows, MD;  Location: [facility] ENDOSCOPY;  Service: Gastroenterology;  Laterality: N/A;  SPECIAL NEEDS - MOTHER NEEDS TO ACCOMPANY HIM.   ESOPHAGOGASTRODUODENOSCOPY (EGD) WITH PROPOFOL N/A 12/30/2022   Procedure: ESOPHAGOGASTRODUODENOSCOPY (EGD) WITH PROPOFOL;  Surgeon: Jonathon Bellows, MD;  Location: [facility] ENDOSCOPY;  Service: Gastroenterology;  Laterality: N/A;   HERNIA REPAIR     Inguinal   TYMPANOSTOMY TUBE PLACEMENT      SOCIAL HISTORY: Social History   Socioeconomic History   Marital status: Single    Spouse name: Not on file   Number of children: 0   Years of education: Handicapped diploma   Highest education level: Not on file  Occupational History    Employer: DISABLED  Tobacco Use   Smoking status: Never   Smokeless tobacco: Never   Tobacco comments:    smoking cessation materials not required  Vaping Use   Vaping Use: Never used  Substance and Sexual Activity   Alcohol use: No    Alcohol/week: 0.0 standard drinks of alcohol   Drug use: No   Sexual activity: Never  Other Topics Concern   Not on file  Social History Narrative   Moderate mental retardation. Parents are the primary caregiver, he lives with them   Social Determinants of Health   Financial Resource Strain: Low Risk  (04/15/2022)   Overall Financial Resource Strain (CARDIA)    Difficulty of Paying Living Expenses: Not hard at all  Food Insecurity: No Food Insecurity (01/13/2023)   Hunger Vital Sign    Worried About Running Out of Food in the Last  Year: Never true    Ran Out of Food in the Last Year: Never true  Transportation Needs: No Transportation Needs (01/13/2023)   PRAPARE - Hydrologist (Medical): No    Lack of Transportation (Non-Medical): No  Physical Activity: Insufficiently Active (04/15/2022)   Exercise Vital Sign    Days of Exercise per Week: 7 days    Minutes of Exercise per Session: 10 min  Stress: No Stress Concern Present (04/15/2022)   [place]    Feeling of Stress : Not at all  Social Connections: Socially Isolated (04/15/2022)   Social Connection and Isolation Panel [NHANES]    Frequency of Communication with Friends and Family: Twice a week    Frequency of Social Gatherings with Friends and Family: Once a week    Attends Religious Services: Never    Marine scientist or Organizations: No    Attends Archivist Meetings: Never  Marital Status: Never married  Intimate Partner Violence: Not At Risk (01/13/2023)   Humiliation, Afraid, Rape, and Kick questionnaire    Fear of Current or Ex-Partner: No    Emotionally Abused: No    Physically Abused: No    Sexually Abused: No    FAMILY HISTORY: Family History  Problem Relation Age of Onset   Hypertension Mother    Thyroid disease Mother    CAD Father    Hypertension Father    Hyperlipidemia Father    Healthy Brother    Healthy Sister    Diabetes Brother    COPD Brother    Emphysema Brother    Alcohol abuse Brother    Hypertension Brother    Aneurysm Brother        brain    ALLERGIES:  has No Known Allergies.  MEDICATIONS:  Current Outpatient Medications  Medication Sig Dispense Refill   carbamazepine (TEGRETOL) 200 MG tablet TAKE 2 TABLETS BY MOUTH IN THE MORNING, THEN TAKE  1 TABLET AT LUNCH AND TAKE 1 AT NIGHT 360 tablet 1   loratadine (CLARITIN) 10 MG tablet Take 1 tablet (10 mg total) by mouth daily. 30 tablet 5   Multiple Vitamin  (MULTIVITAMIN) tablet Take 1 tablet by mouth daily.     PHENobarbital (LUMINAL) 64.8 MG tablet Take 1 tablet (64.8 mg total) by mouth 2 (two) times daily. 180 tablet 1   polyethylene glycol powder (GLYCOLAX/MIRALAX) 17 GM/SCOOP powder Take 17 g by mouth once. 1 scoop per day     PREVIDENT 5000 SENSITIVE 1.1-5 % GEL Place onto teeth 2 (two) times daily.     rosuvastatin (CRESTOR) 10 MG tablet Take 1 tablet (10 mg total) by mouth daily. 90 tablet 1   valACYclovir (VALTREX) 1000 MG tablet TAKE 1 TABLET BY MOUTH TWICE DAILY AS NEEDED FOR  OUTBREAK 30 tablet 0   No current facility-administered medications for this visit.    REVIEW OF SYSTEMS:   Pertinent information mentioned in HPI All other systems were reviewed with the patient and are negative.  PHYSICAL EXAMINATION: ECOG PERFORMANCE STATUS: 0 - Asymptomatic  Vitals:   01/13/23 1102  BP: 128/79  Pulse: 73  Resp: 18  Temp: 97.9 F (36.6 C)   Filed Weights   01/13/23 1102  Weight: 172 lb 4.8 oz (78.2 kg)    GENERAL:alert, no distress and comfortable SKIN: skin color, texture, turgor are normal, no rashes or significant lesions EYES: normal, conjunctiva are pink and non-injected, sclera clear OROPHARYNX:no exudate, no erythema and lips, buccal mucosa, and tongue normal  NECK: supple, thyroid normal size, non-tender, without nodularity LYMPH:  no palpable lymphadenopathy in the cervical, axillary or inguinal LUNGS: clear to auscultation and percussion with normal breathing effort HEART: regular rate & rhythm and no murmurs and no lower extremity edema ABDOMEN:abdomen soft, non-tender and normal bowel sounds Musculoskeletal:no cyanosis of digits and no clubbing  PSYCH: alert & oriented x 3 with fluent speech NEURO: no focal motor/sensory deficits  LABORATORY DATA:  I have reviewed the data as listed Lab Results  Component Value Date   WBC 6.2 12/10/2022   HGB 13.5 12/10/2022   HCT 38.8 12/10/2022   MCV 88.6 12/10/2022    PLT 178 12/10/2022   Recent Labs    12/10/22 1140  NA 140  K 3.8  CL 102  CO2 26  GLUCOSE 56*  BUN 13  CREATININE 0.91  CALCIUM 9.3  PROT 6.9  AST 14  ALT 13  BILITOT 0.3  RADIOGRAPHIC STUDIES: I have personally reviewed the radiological images as listed and agreed with the findings in the report. No results found.

## 2023-01-13 NOTE — Progress Notes (Signed)
Wants to know why he is here.

## 2023-01-19 ENCOUNTER — Inpatient Hospital Stay (HOSPITAL_BASED_OUTPATIENT_CLINIC_OR_DEPARTMENT_OTHER): Payer: 59 | Admitting: Hospice and Palliative Medicine

## 2023-01-19 DIAGNOSIS — K6389 Other specified diseases of intestine: Secondary | ICD-10-CM

## 2023-01-19 NOTE — Progress Notes (Signed)
Multidisciplinary Oncology Council Documentation  Alec Campbell was presented by our [facility] on 01/19/2023, which included representatives from:  Palliative Care Dietitian  Physical/Occupational Therapist Nurse Navigator Genetics Speech Therapist Social work Survivorship RN Financial Navigator Research RN   Alec Campbell currently presents with history of colon mass  We reviewed previous medical and familial history, history of present illness, and recent lab results along with all available histopathologic and imaging studies. The [place] considered available treatment options and made the following recommendations/referrals:  None currently  The MOC is a meeting of clinicians from various specialty areas who evaluate and discuss patients for whom a multidisciplinary approach is being considered. Final determinations in the plan of care are those of the provider(s).   Today's extended care, comprehensive team conference, Alec Campbell was not present for the discussion and was not examined.

## 2023-01-24 ENCOUNTER — Other Ambulatory Visit: Payer: Self-pay

## 2023-01-24 ENCOUNTER — Encounter: Payer: Self-pay | Admitting: Surgery

## 2023-01-24 ENCOUNTER — Ambulatory Visit (INDEPENDENT_AMBULATORY_CARE_PROVIDER_SITE_OTHER): Payer: 59 | Admitting: Surgery

## 2023-01-24 VITALS — BP 134/88 | HR 70 | Temp 97.9°F | Ht 70.0 in | Wt 168.0 lb

## 2023-01-24 DIAGNOSIS — K6389 Other specified diseases of intestine: Secondary | ICD-10-CM | POA: Diagnosis not present

## 2023-01-24 NOTE — Patient Instructions (Addendum)
Our surgery scheduler will call you within 24-48 hours to schedule your surgery. Please have the [place] surgery sheet available when speaking with her.   Please ask the nurse about what bowel prep Dr.Anna used and bring it to your next appointment.  Please see your follow up appointment listed below.

## 2023-01-25 ENCOUNTER — Telehealth: Payer: Self-pay | Admitting: Surgery

## 2023-01-25 NOTE — Progress Notes (Signed)
Patient ID: Alec Campbell, male   DOB: 1971-08-03, 52 y.o.   MRN: [id number]  HPI Alec Campbell is a 52 y.o. male in consultation at the request of Dr. Clementeen Graham for ascending colon mass.  He was being worked up for anemia and recent colonoscopy showed evidence of ascending colon mass.  Please note that I personally reviewed the images related to the colonoscopy.  Also pathology personally reviewed showing evidence of tubular adenoma with high-grade dysplasia.  The polyp was not resectable. He did have a recent chest x-ray that I have personally reviewed showing no evidence of active cardiopulmonary disease.  Recent CBC and CMP were normal. History is taken from mother.  No evidence of hematochezia or melena no nausea no vomiting. Does have a significant history of developmental disorder and had emergency hernia surgery as a newborn.  He also had cleft lip and cleft palate surgery as well. He lives with his parents.  He is able to walk she is able to climb stairs without shortness of breath or chest pain. Family history of colorectal cancer. Does have a history of intellectual disability with epilepsy.   HPI  Past Medical History:  Diagnosis Date   Allergy    Moderate mental retardation    Seizures ([place])    Vitamin D deficiency     Past Surgical History:  Procedure Laterality Date   ANKLE SURGERY Right    CLEFT LIP REPAIR     COLONOSCOPY N/A 12/30/2022   Procedure: COLONOSCOPY;  Surgeon: Jonathon Bellows, MD;  Location: [facility] ENDOSCOPY;  Service: Gastroenterology;  Laterality: N/A;  SPECIAL NEEDS - MOTHER NEEDS TO ACCOMPANY HIM.   ESOPHAGOGASTRODUODENOSCOPY (EGD) WITH PROPOFOL N/A 12/30/2022   Procedure: ESOPHAGOGASTRODUODENOSCOPY (EGD) WITH PROPOFOL;  Surgeon: Jonathon Bellows, MD;  Location: [facility] ENDOSCOPY;  Service: Gastroenterology;  Laterality: N/A;   HERNIA REPAIR     Inguinal   PALATE SURGERY     TYMPANOSTOMY TUBE PLACEMENT      Family History  Problem Relation Age of Onset   Hypertension  Mother    Thyroid disease Mother    CAD Father    Hypertension Father    Hyperlipidemia Father    Healthy Brother    Healthy Sister    Diabetes Brother    COPD Brother    Emphysema Brother    Alcohol abuse Brother    Hypertension Brother    Aneurysm Brother        brain    Social History Social History   Tobacco Use   Smoking status: Never   Smokeless tobacco: Never   Tobacco comments:    smoking cessation materials not required  Vaping Use   Vaping Use: Never used  Substance Use Topics   Alcohol use: No    Alcohol/week: 0.0 standard drinks of alcohol   Drug use: No    No Known Allergies  Current Outpatient Medications  Medication Sig Dispense Refill   carbamazepine (TEGRETOL) 200 MG tablet TAKE 2 TABLETS BY MOUTH IN THE MORNING, THEN TAKE  1 TABLET AT LUNCH AND TAKE 1 AT NIGHT 360 tablet 1   loratadine (CLARITIN) 10 MG tablet Take 1 tablet (10 mg total) by mouth daily. 30 tablet 5   Multiple Vitamin (MULTIVITAMIN) tablet Take 1 tablet by mouth daily.     PHENobarbital (LUMINAL) 64.8 MG tablet Take 1 tablet (64.8 mg total) by mouth 2 (two) times daily. 180 tablet 1   polyethylene glycol powder (GLYCOLAX/MIRALAX) 17 GM/SCOOP powder Take 17 g by mouth once. 1 scoop  per day     PREVIDENT 5000 SENSITIVE 1.1-5 % GEL Place onto teeth 2 (two) times daily.     rosuvastatin (CRESTOR) 10 MG tablet Take 1 tablet (10 mg total) by mouth daily. 90 tablet 1   valACYclovir (VALTREX) 1000 MG tablet TAKE 1 TABLET BY MOUTH TWICE DAILY AS NEEDED FOR  OUTBREAK 30 tablet 0   No current facility-administered medications for this visit.     Review of Systems Review of system unable to obtain given learning disability  Physical Exam Blood pressure 134/88, pulse 70, temperature 97.9 F (36.6 C), temperature source Oral, height 5\' 10"  (1.778 m), weight 168 lb (76.2 kg), SpO2 100 %. CONSTITUTIONAL: NAD. EYES: Pupils are equal, round, and reactive to light, Sclera are non-icteric. EARS,  NOSE, MOUTH AND THROAT: The oropharynx is clear. The oral mucosa is pink and moist. Hearing is intact to voice. LYMPH NODES:  Lymph nodes in the neck are normal. RESPIRATORY:  Lungs are clear. There is normal respiratory effort, with equal breath sounds bilaterally, and without pathologic use of accessory muscles. CARDIOVASCULAR: Heart is regular without murmurs, gallops, or rubs. GI: The abdomen is  soft, nontender, and nondistended. There are no palpable masses. There is no hepatosplenomegaly. There are normal bowel sounds in all quadrants.  Higher inguinal scars bilaterally GU: Rectal deferred.   MUSCULOSKELETAL: Normal muscle strength and tone. No cyanosis or edema.   SKIN: Turgor is good and there are no pathologic skin lesions or ulcers. NEUROLOGIC: Motor and sensation is grossly normal. Cranial nerves are grossly intact. PSYCH:  Oriented to person, place and time. Affect is normal.  Does have learning disability but is aware of his surroundings  Data Reviewed  I have personally reviewed the patient's imaging, laboratory findings and medical records.    Assessment/Plan  52 year old male with significant developmental disorder with a new ascending colon mass consistent with at least tubular adenoma with high-grade dysplasia.  I do think that excision is the neck step.  I agree with performing a CT scan of the abdomen pelvis for staging purposes.  I will like to see him 1 more time preoperatively and discussed once again with mother and father regarding proposed surgery. I Do think that he will be a good candidate for laparoscopic right hemicolectomy.  We did talk about the surgery.  Risk, benefits and possible implications including but not limited to: Bleeding, infection potential ostomy, prolonged hospitalization.   Note that I spent greater than 60 minutes in this encounter including personally reviewing imaging studies, counseling the patient and the family, placing orders and performing  appropriate documentation     Caroleen Hamman, MD FACS General Surgeon 01/25/2023, 7:15 PM

## 2023-01-25 NOTE — Telephone Encounter (Signed)
Spoke with mom, they have been made aware of the following regarding scheduled surgery with Dr. Dahlia Byes.   Pre-Admission date/time, and Surgery date at [facility].  Surgery Date: 02/10/23 Preadmission Testing Date: 01/31/23 (phone 1p-4p)  Patient has been made aware to call [phone], between 1-3:00pm the day before surgery, to find out what time to arrive for surgery.

## 2023-01-27 ENCOUNTER — Other Ambulatory Visit: Payer: Self-pay

## 2023-01-27 ENCOUNTER — Ambulatory Visit: Payer: 59 | Admitting: Gastroenterology

## 2023-01-27 MED ORDER — SUTAB 1479-225-188 MG PO TABS: ORAL_TABLET | ORAL | 0 refills | Status: DC

## 2023-01-28 ENCOUNTER — Ambulatory Visit
Admission: RE | Admit: 2023-01-28 | Discharge: 2023-01-28 | Disposition: A | Discharge status: Home / Self Care | Payer: 59 | Source: Ambulatory Visit | Attending: Internal Medicine | Admitting: Internal Medicine

## 2023-01-28 DIAGNOSIS — K4020 Bilateral inguinal hernia, without obstruction or gangrene, not specified as recurrent: Secondary | ICD-10-CM | POA: Insufficient documentation

## 2023-01-28 DIAGNOSIS — Q676 Pectus excavatum: Secondary | ICD-10-CM | POA: Diagnosis not present

## 2023-01-28 DIAGNOSIS — K6389 Other specified diseases of intestine: Secondary | ICD-10-CM | POA: Insufficient documentation

## 2023-01-28 DIAGNOSIS — K8020 Calculus of gallbladder without cholecystitis without obstruction: Secondary | ICD-10-CM | POA: Insufficient documentation

## 2023-01-28 MED ORDER — IOHEXOL 300 MG/ML  SOLN
100.0000 mL | Freq: Once | INTRAMUSCULAR | Status: AC | PRN
  Administered 2023-01-28: 100 mL via INTRAVENOUS

## 2023-01-31 ENCOUNTER — Encounter: Payer: Self-pay | Admitting: Surgery

## 2023-01-31 ENCOUNTER — Ambulatory Visit (INDEPENDENT_AMBULATORY_CARE_PROVIDER_SITE_OTHER): Payer: 59 | Admitting: Surgery

## 2023-01-31 ENCOUNTER — Other Ambulatory Visit: Payer: Self-pay

## 2023-01-31 ENCOUNTER — Encounter
Admission: RE | Admit: 2023-01-31 | Discharge: 2023-01-31 | Disposition: A | Discharge status: Home / Self Care | Payer: 59 | Source: Ambulatory Visit | Attending: Surgery | Admitting: Surgery

## 2023-01-31 VITALS — BP 133/92 | HR 72 | Temp 97.7°F | Ht 70.0 in | Wt 172.0 lb

## 2023-01-31 DIAGNOSIS — K6389 Other specified diseases of intestine: Secondary | ICD-10-CM | POA: Diagnosis not present

## 2023-01-31 MED ORDER — METRONIDAZOLE 500 MG PO TABS: ORAL_TABLET | ORAL | 0 refills | Status: DC

## 2023-01-31 MED ORDER — NEOMYCIN SULFATE 500 MG PO TABS: ORAL_TABLET | ORAL | 0 refills | Status: DC

## 2023-01-31 NOTE — Patient Instructions (Signed)
Your procedure is scheduled on: Thursday 02/10/23 To find out your arrival time, please call [phone] between 1PM - 3PM on:   Wednesday 02/09/23 Report to the Registration Desk on the 1st floor of the [place]. Valet parking is available.  If your arrival time is 6:00 am, do not arrive before that time as the [facility] entrance doors do not open until 6:00 am.  REMEMBER: Instructions that are not followed completely may result in serious medical risk, up to and including death; or upon the discretion of your surgeon and anesthesiologist your surgery may need to be rescheduled.  Follow Dr Corlis Leak instructions regarding clear liquids diet the day before surgery.  One week prior to surgery: Stop Anti-inflammatories (NSAIDS) such as Advil, Aleve, Ibuprofen, Motrin, Naproxen, Naprosyn and Aspirin based products such as Excedrin, Goody's Powder, BC Powder. You may however, continue to take Tylenol if needed for pain up until the day of surgery.  Stop ANY OVER THE COUNTER supplements until after surgery.  Continue taking all prescribed medications.   TAKE ONLY THESE MEDICATIONS THE MORNING OF SURGERY WITH A SIP OF WATER:  carbamazepine (TEGRETOL)   PHENobarbital (LUMINAL)  rosuvastatin (CRESTOR)  valACYclovir (VALTREX) if needed  No Alcohol for 24 hours before or after surgery.  No Smoking including e-cigarettes for 24 hours before surgery.  No chewable tobacco products for at least 6 hours before surgery.  No nicotine patches on the day of surgery.  Do not use any "recreational" drugs for at least a week (preferably 2 weeks) before your surgery.  Please be advised that the combination of cocaine and anesthesia may have negative outcomes, up to and including death. If you test positive for cocaine, your surgery will be cancelled.  On the morning of surgery brush your teeth with toothpaste and water, you may rinse your mouth with mouthwash if you wish. Do not swallow any  toothpaste or mouthwash.  Use CHG Soap or wipes as directed on instruction sheet. You can pick up this soap at our office in the [facility] [place] not wear lotions, powders, or perfumes.   Do not shave body hair from the neck down 48 hours before surgery.  Wear comfortable clothing (specific to your surgery type) to the hospital.  Do not wear jewelry, make-up, hairpins, clips or nail polish.  Contact lenses, hearing aids and dentures may not be worn into surgery.  Do not bring valuables to the hospital. [facility] is not responsible for any missing/lost belongings or valuables.   Notify your doctor if there is any change in your medical condition (cold, fever, infection).  If you are being discharged the day of surgery, you will not be allowed to drive home. You will need a responsible individual to drive you home and stay with you for 24 hours after surgery.   If you are taking public transportation, you will need to have a responsible individual with you.  If you are being admitted to the hospital overnight, leave your suitcase in the car. After surgery it may be brought to your room.  In case of increased patient census, it may be necessary for you, the patient, to continue your postoperative care in the Same Day Surgery department.  After surgery, you can help prevent lung complications by doing breathing exercises.  Take deep breaths and cough every 1-2 hours. Your doctor may order a device called an Incentive Spirometer to help  you take deep breaths. When coughing or sneezing, hold a pillow firmly against your incision with both hands. This is called "splinting." Doing this helps protect your incision. It also decreases belly discomfort.  Surgery Visitation Policy:  Patients undergoing a surgery or procedure may have two family members or support persons with them as long as the person is not COVID-19 positive or  experiencing its symptoms.   Inpatient Visitation:    Visiting hours are 7 a.m. to 8 p.m. Up to four visitors are allowed at one time in a patient room. The visitors may rotate out with other people during the day. One designated support person (adult) may remain overnight.  Please call the [facility]. at [phone] if you have any questions about these instructions.

## 2023-01-31 NOTE — Patient Instructions (Signed)
Pisk up your medication at the pharmacy. Please follow the Bowel prep instructions.

## 2023-02-02 NOTE — H&P (View-Only) (Signed)
Outpatient Surgical Follow Up  02/02/2023  Alec Campbell is an 51 y.o. male.   Chief Complaint  Patient presents with   Follow-up    Discuss CT    HPI:    Alec Campbell is a 51 y.o. male inf/u for ascending colon mass.  He was being worked up for anemia and recent colonoscopy showed evidence of ascending colon mass.  Please note that I personally reviewed the images related to the colonoscopy.  Also pathology personally reviewed showing evidence of tubular adenoma with high-grade dysplasia.  The polyp was not resectable. He did have a recent chest x-ray that I have personally reviewed showing no evidence of active cardiopulmonary disease.  Recent CBC and CMP were normal. Completed a CT scan of the abdomen pelvis that I personally reviewed showing evidence of bilateral inguinal hernias some cholelithiasis and some fullness in the ascending colon consistent with that unresectable polyp.  Please note that I have had a good discussion with them regarding cholelithiasis.  The patient does not seem to be symptomatic at that time.  Mother specifically wishes to avoid any unnecessary surgeries which I agree  History is taken from mother.  No evidence of hematochezia or melena no nausea no vomiting. He Does have a significant history of developmental disorder and had emergency hernia surgery as a newborn.  He also had cleft lip and cleft palate surgery as well. He lives with his parents.  He is able to walk she is able to climb stairs without shortness of breath or chest pain.Does have a history of intellectual disability with epilepsy.   Past Surgical History:  Procedure Laterality Date   ANKLE SURGERY Right    CLEFT LIP REPAIR     COLONOSCOPY N/A 12/30/2022   Procedure: COLONOSCOPY;  Surgeon: Anna, Kiran, MD;  Location: [facility] ENDOSCOPY;  Service: Gastroenterology;  Laterality: N/A;  SPECIAL NEEDS - MOTHER NEEDS TO ACCOMPANY HIM.   ESOPHAGOGASTRODUODENOSCOPY (EGD) WITH PROPOFOL N/A 12/30/2022    Procedure: ESOPHAGOGASTRODUODENOSCOPY (EGD) WITH PROPOFOL;  Surgeon: Anna, Kiran, MD;  Location: [facility] ENDOSCOPY;  Service: Gastroenterology;  Laterality: N/A;   HERNIA REPAIR     Inguinal   PALATE SURGERY     TYMPANOSTOMY TUBE PLACEMENT      Family History  Problem Relation Age of Onset   Hypertension Mother    Thyroid disease Mother    CAD Father    Hypertension Father    Hyperlipidemia Father    Healthy Brother    Healthy Sister    Diabetes Brother    COPD Brother    Emphysema Brother    Alcohol abuse Brother    Hypertension Brother    Aneurysm Brother        brain    Social History:  reports that he has never smoked. He has never used smokeless tobacco. He reports that he does not drink alcohol and does not use drugs.  Allergies: No Known Allergies  Medications reviewed.    ROS Full ROS performed and is otherwise negative other than what is stated in HPI   BP (!) 133/92   Pulse 72   Temp 97.7 F (36.5 C) (Oral)   Ht 5' 10" (1.778 m)   Wt 172 lb (78 kg)   SpO2 98%   BMI 24.68 kg/m   Physical Exam EYES: Pupils are equal, round, and reactive to light, Sclera are non-icteric. EARS, NOSE, MOUTH AND THROAT: The oropharynx is clear. The oral mucosa is pink and moist. Hearing is intact to voice. LYMPH   NODES:  Lymph nodes in the neck are normal. RESPIRATORY:  Lungs are clear. There is normal respiratory effort, with equal breath sounds bilaterally, and without pathologic use of accessory muscles. CARDIOVASCULAR: Heart is regular without murmurs, gallops, or rubs. GI: The abdomen is  soft, nontender, and nondistended. There are no palpable masses. There is no hepatosplenomegaly. There are normal bowel sounds in all quadrants.  Higher inguinal scars bilaterally GU: Rectal deferred.   MUSCULOSKELETAL: Normal muscle strength and tone. No cyanosis or edema.   SKIN: Turgor is good and there are no pathologic skin lesions or ulcers. NEUROLOGIC: Motor and sensation is  grossly normal. Cranial nerves are grossly intact. PSYCH:  Oriented to person, place and time. Affect is normal.  He Does have learning disability but is aware of his surroundings   Assessment/Plan:  51-year-old male with significant developmental disorder with a new ascending colon mass consistent with at least tubular adenoma with high-grade dysplasia. No evidence of distant metastatic disease  I do think that excision is the neck step.  I agree with performing a CT scan of the abdomen pelvis for staging purposes.  I will like to see him 1 more time preoperatively and discussed once again with mother and father regarding proposed surgery. I Do think that he will be a good candidate for laparoscopic right hemicolectomy.  We did talk about the surgery.  Risk, benefits and possible implications including but not limited to: Bleeding, infection potential ostomy, prolonged hospitalization. Discussed about bowel prep and diet modifications preoperatively    Note that I spent 40 minutes in this encounter including personally reviewing imaging studies, counseling the patient and the family, placing orders and performing appropriate documentation     Jianni Batten, MD FACS General Surgeon 

## 2023-02-02 NOTE — Progress Notes (Signed)
Outpatient Surgical Follow Up  02/02/2023  Lean Valliere is an 52 y.o. male.   Chief Complaint  Patient presents with   Follow-up    Discuss CT    HPI:    Erling Sherrard is a 51 y.o. male inf/u for ascending colon mass.  He was being worked up for anemia and recent colonoscopy showed evidence of ascending colon mass.  Please note that I personally reviewed the images related to the colonoscopy.  Also pathology personally reviewed showing evidence of tubular adenoma with high-grade dysplasia.  The polyp was not resectable. He did have a recent chest x-ray that I have personally reviewed showing no evidence of active cardiopulmonary disease.  Recent CBC and CMP were normal. Completed a CT scan of the abdomen pelvis that I personally reviewed showing evidence of bilateral inguinal hernias some cholelithiasis and some fullness in the ascending colon consistent with that unresectable polyp.  Please note that I have had a good discussion with them regarding cholelithiasis.  The patient does not seem to be symptomatic at that time.  Mother specifically wishes to avoid any unnecessary surgeries which I agree  History is taken from mother.  No evidence of hematochezia or melena no nausea no vomiting. He Does have a significant history of developmental disorder and had emergency hernia surgery as a newborn.  He also had cleft lip and cleft palate surgery as well. He lives with his parents.  He is able to walk she is able to climb stairs without shortness of breath or chest pain.Does have a history of intellectual disability with epilepsy.   Past Surgical History:  Procedure Laterality Date   ANKLE SURGERY Right    CLEFT LIP REPAIR     COLONOSCOPY N/A 12/30/2022   Procedure: COLONOSCOPY;  Surgeon: Jonathon Bellows, MD;  Location: [facility] ENDOSCOPY;  Service: Gastroenterology;  Laterality: N/A;  SPECIAL NEEDS - MOTHER NEEDS TO ACCOMPANY HIM.   ESOPHAGOGASTRODUODENOSCOPY (EGD) WITH PROPOFOL N/A 12/30/2022    Procedure: ESOPHAGOGASTRODUODENOSCOPY (EGD) WITH PROPOFOL;  Surgeon: Jonathon Bellows, MD;  Location: [facility] ENDOSCOPY;  Service: Gastroenterology;  Laterality: N/A;   HERNIA REPAIR     Inguinal   PALATE SURGERY     TYMPANOSTOMY TUBE PLACEMENT      Family History  Problem Relation Age of Onset   Hypertension Mother    Thyroid disease Mother    CAD Father    Hypertension Father    Hyperlipidemia Father    Healthy Brother    Healthy Sister    Diabetes Brother    COPD Brother    Emphysema Brother    Alcohol abuse Brother    Hypertension Brother    Aneurysm Brother        brain    Social History:  reports that he has never smoked. He has never used smokeless tobacco. He reports that he does not drink alcohol and does not use drugs.  Allergies: No Known Allergies  Medications reviewed.    ROS Full ROS performed and is otherwise negative other than what is stated in HPI   BP (!) 133/92   Pulse 72   Temp 97.7 F (36.5 C) (Oral)   Ht 5\' 10"  (1.778 m)   Wt 172 lb (78 kg)   SpO2 98%   BMI 24.68 kg/m   Physical Exam EYES: Pupils are equal, round, and reactive to light, Sclera are non-icteric. EARS, NOSE, MOUTH AND THROAT: The oropharynx is clear. The oral mucosa is pink and moist. Hearing is intact to voice. LYMPH  NODES:  Lymph nodes in the neck are normal. RESPIRATORY:  Lungs are clear. There is normal respiratory effort, with equal breath sounds bilaterally, and without pathologic use of accessory muscles. CARDIOVASCULAR: Heart is regular without murmurs, gallops, or rubs. GI: The abdomen is  soft, nontender, and nondistended. There are no palpable masses. There is no hepatosplenomegaly. There are normal bowel sounds in all quadrants.  Higher inguinal scars bilaterally GU: Rectal deferred.   MUSCULOSKELETAL: Normal muscle strength and tone. No cyanosis or edema.   SKIN: Turgor is good and there are no pathologic skin lesions or ulcers. NEUROLOGIC: Motor and sensation is  grossly normal. Cranial nerves are grossly intact. PSYCH:  Oriented to person, place and time. Affect is normal.  He Does have learning disability but is aware of his surroundings   Assessment/Plan:  52 year old male with significant developmental disorder with a new ascending colon mass consistent with at least tubular adenoma with high-grade dysplasia. No evidence of distant metastatic disease  I do think that excision is the neck step.  I agree with performing a CT scan of the abdomen pelvis for staging purposes.  I will like to see him 1 more time preoperatively and discussed once again with mother and father regarding proposed surgery. I Do think that he will be a good candidate for laparoscopic right hemicolectomy.  We did talk about the surgery.  Risk, benefits and possible implications including but not limited to: Bleeding, infection potential ostomy, prolonged hospitalization. Discussed about bowel prep and diet modifications preoperatively    Note that I spent 40 minutes in this encounter including personally reviewing imaging studies, counseling the patient and the family, placing orders and performing appropriate documentation     Caroleen Hamman, MD [place] Surgeon

## 2023-02-03 ENCOUNTER — Telehealth: Payer: Self-pay | Admitting: Family Medicine

## 2023-02-03 DIAGNOSIS — R1084 Generalized abdominal pain: Secondary | ICD-10-CM | POA: Diagnosis not present

## 2023-02-03 NOTE — Telephone Encounter (Signed)
Please return Alec Campbell call on Monday. Pt is having surgery on 4.11.2024 due to having a mass on colon by [facility]. He was also told that he has gallstones. He was offered to do both surgeries at the same time and [name] declined, but now she is wondering if she should let them do both surgeries at the same time. She would like your advice.

## 2023-02-05 LAB — H. PYLORI ANTIGEN, STOOL: H pylori Ag, Stl: NEGATIVE

## 2023-02-09 MED ORDER — SODIUM CHLORIDE 0.9 % IV SOLN
2.0000 g | INTRAVENOUS | Status: AC
  Administered 2023-02-10: 2 g via INTRAVENOUS

## 2023-02-09 MED ORDER — CHLORHEXIDINE GLUCONATE CLOTH 2 % EX PADS: 6.0000 | MEDICATED_PAD | Freq: Once | CUTANEOUS | Status: DC

## 2023-02-09 MED ORDER — LACTATED RINGERS IV SOLN: INTRAVENOUS | Status: DC

## 2023-02-09 MED ORDER — ALVIMOPAN 12 MG PO CAPS: 12.0000 mg | ORAL_CAPSULE | ORAL | Status: DC

## 2023-02-09 MED ORDER — GABAPENTIN 300 MG PO CAPS: 300.0000 mg | ORAL_CAPSULE | ORAL | Status: DC

## 2023-02-09 MED ORDER — CHLORHEXIDINE GLUCONATE 0.12 % MT SOLN: 15.0000 mL | Freq: Once | OROMUCOSAL | Status: DC

## 2023-02-09 MED ORDER — CELECOXIB 200 MG PO CAPS: 200.0000 mg | ORAL_CAPSULE | ORAL | Status: DC

## 2023-02-09 MED ORDER — ORAL CARE MOUTH RINSE: 15.0000 mL | Freq: Once | OROMUCOSAL | Status: DC

## 2023-02-10 ENCOUNTER — Inpatient Hospital Stay
Admission: RE | Admit: 2023-02-10 | Discharge: 2023-02-11 | DRG: 330 | Disposition: A | Discharge status: Home / Self Care | Payer: 59 | Attending: Surgery | Admitting: Surgery

## 2023-02-10 ENCOUNTER — Encounter
Admission: RE | Disposition: A | Discharge status: Home / Self Care | Payer: Self-pay | Source: Home / Self Care | Attending: Surgery

## 2023-02-10 ENCOUNTER — Inpatient Hospital Stay: Payer: 59 | Admitting: Registered Nurse

## 2023-02-10 ENCOUNTER — Other Ambulatory Visit: Payer: Self-pay

## 2023-02-10 ENCOUNTER — Encounter: Payer: Self-pay | Admitting: Surgery

## 2023-02-10 DIAGNOSIS — C189 Malignant neoplasm of colon, unspecified: Secondary | ICD-10-CM | POA: Diagnosis not present

## 2023-02-10 DIAGNOSIS — F79 Unspecified intellectual disabilities: Secondary | ICD-10-CM | POA: Diagnosis present

## 2023-02-10 DIAGNOSIS — Z825 Family history of asthma and other chronic lower respiratory diseases: Secondary | ICD-10-CM | POA: Diagnosis not present

## 2023-02-10 DIAGNOSIS — Z8349 Family history of other endocrine, nutritional and metabolic diseases: Secondary | ICD-10-CM | POA: Diagnosis not present

## 2023-02-10 DIAGNOSIS — R6250 Unspecified lack of expected normal physiological development in childhood: Secondary | ICD-10-CM | POA: Diagnosis present

## 2023-02-10 DIAGNOSIS — C182 Malignant neoplasm of ascending colon: Secondary | ICD-10-CM | POA: Diagnosis not present

## 2023-02-10 DIAGNOSIS — Z87730 Personal history of (corrected) cleft lip and palate: Secondary | ICD-10-CM | POA: Diagnosis not present

## 2023-02-10 DIAGNOSIS — G40909 Epilepsy, unspecified, not intractable, without status epilepticus: Secondary | ICD-10-CM | POA: Diagnosis present

## 2023-02-10 DIAGNOSIS — Z833 Family history of diabetes mellitus: Secondary | ICD-10-CM | POA: Diagnosis not present

## 2023-02-10 DIAGNOSIS — D72829 Elevated white blood cell count, unspecified: Secondary | ICD-10-CM | POA: Diagnosis not present

## 2023-02-10 DIAGNOSIS — Z83438 Family history of other disorder of lipoprotein metabolism and other lipidemia: Secondary | ICD-10-CM | POA: Diagnosis not present

## 2023-02-10 DIAGNOSIS — K6389 Other specified diseases of intestine: Secondary | ICD-10-CM

## 2023-02-10 DIAGNOSIS — Z811 Family history of alcohol abuse and dependence: Secondary | ICD-10-CM

## 2023-02-10 DIAGNOSIS — Z8249 Family history of ischemic heart disease and other diseases of the circulatory system: Secondary | ICD-10-CM | POA: Diagnosis not present

## 2023-02-10 DIAGNOSIS — Z9049 Acquired absence of other specified parts of digestive tract: Principal | ICD-10-CM

## 2023-02-10 DIAGNOSIS — K8010 Calculus of gallbladder with chronic cholecystitis without obstruction: Secondary | ICD-10-CM

## 2023-02-10 HISTORY — PX: LAPAROSCOPIC RIGHT COLECTOMY: SHX5925

## 2023-02-10 HISTORY — PX: CHOLECYSTECTOMY: SHX55

## 2023-02-10 LAB — CBC
HCT: 34.6 % — ABNORMAL LOW (ref 39.0–52.0)
Hemoglobin: 12.1 g/dL — ABNORMAL LOW (ref 13.0–17.0)
MCH: 30.3 pg (ref 26.0–34.0)
MCHC: 35 g/dL (ref 30.0–36.0)
MCV: 86.7 fL (ref 80.0–100.0)
Platelets: 178 10*3/uL (ref 150–400)
RBC: 3.99 MIL/uL — ABNORMAL LOW (ref 4.22–5.81)
RDW: 12.4 % (ref 11.5–15.5)
WBC: 19 10*3/uL — ABNORMAL HIGH (ref 4.0–10.5)
nRBC: 0 % (ref 0.0–0.2)

## 2023-02-10 LAB — TYPE AND SCREEN
ABO/RH(D): O POS
Antibody Screen: NEGATIVE

## 2023-02-10 LAB — ABO/RH: ABO/RH(D): O POS

## 2023-02-10 LAB — CREATININE, SERUM
Creatinine, Ser: 1.05 mg/dL (ref 0.61–1.24)
GFR, Estimated: >60 mL/min (ref >60)

## 2023-02-10 SURGERY — COLECTOMY, RIGHT, LAPAROSCOPIC
Anesthesia: General

## 2023-02-10 MED ORDER — OXYCODONE HCL 5 MG/5ML PO SOLN: 5.0000 mg | Freq: Once | ORAL | Status: DC | PRN

## 2023-02-10 MED ORDER — SEVOFLURANE IN SOLN
RESPIRATORY_TRACT | Status: AC
  Filled 2023-02-10: qty 250

## 2023-02-10 MED ORDER — ONDANSETRON HCL 4 MG/2ML IJ SOLN: 4.0000 mg | Freq: Four times a day (QID) | INTRAMUSCULAR | Status: DC | PRN

## 2023-02-10 MED ORDER — DIPHENHYDRAMINE HCL 50 MG/ML IJ SOLN
INTRAMUSCULAR | Status: AC
  Filled 2023-02-10: qty 1

## 2023-02-10 MED ORDER — ACETAMINOPHEN 500 MG PO TABS: 1000.0000 mg | ORAL_TABLET | Freq: Four times a day (QID) | ORAL | Status: DC

## 2023-02-10 MED ORDER — PHENYLEPHRINE HCL-NACL 20-0.9 MG/250ML-% IV SOLN
INTRAVENOUS | Status: DC | PRN
  Administered 2023-02-10: 30 ug/min via INTRAVENOUS

## 2023-02-10 MED ORDER — CELECOXIB 200 MG PO CAPS
ORAL_CAPSULE | ORAL | Status: AC
  Filled 2023-02-10: qty 1

## 2023-02-10 MED ORDER — KETAMINE HCL 50 MG/5ML IJ SOSY
PREFILLED_SYRINGE | INTRAMUSCULAR | Status: AC
  Filled 2023-02-10: qty 5

## 2023-02-10 MED ORDER — HYDROMORPHONE HCL 1 MG/ML IJ SOLN
INTRAMUSCULAR | Status: AC
  Filled 2023-02-10: qty 1

## 2023-02-10 MED ORDER — CARBAMAZEPINE 200 MG PO TABS
200.0000 mg | ORAL_TABLET | ORAL | Status: DC
  Administered 2023-02-10 – 2023-02-11 (×2): 200 mg via ORAL
  Filled 2023-02-10 (×3): qty 1

## 2023-02-10 MED ORDER — SODIUM CHLORIDE 0.9 % IV SOLN
INTRAVENOUS | Status: DC | PRN
  Administered 2023-02-10: 70 mL

## 2023-02-10 MED ORDER — BUPIVACAINE-EPINEPHRINE (PF) 0.5% -1:200000 IJ SOLN
INTRAMUSCULAR | Status: AC
  Filled 2023-02-10: qty 30

## 2023-02-10 MED ORDER — BUPIVACAINE LIPOSOME 1.3 % IJ SUSP
INTRAMUSCULAR | Status: AC
  Filled 2023-02-10: qty 20

## 2023-02-10 MED ORDER — ROCURONIUM BROMIDE 10 MG/ML (PF) SYRINGE
PREFILLED_SYRINGE | INTRAVENOUS | Status: AC
  Filled 2023-02-10: qty 10

## 2023-02-10 MED ORDER — ONDANSETRON HCL 4 MG/2ML IJ SOLN
INTRAMUSCULAR | Status: AC
  Filled 2023-02-10: qty 2

## 2023-02-10 MED ORDER — SODIUM CHLORIDE (PF) 0.9 % IJ SOLN
INTRAMUSCULAR | Status: AC
  Filled 2023-02-10: qty 50

## 2023-02-10 MED ORDER — EPHEDRINE SULFATE (PRESSORS) 50 MG/ML IJ SOLN
INTRAMUSCULAR | Status: DC | PRN
  Administered 2023-02-10: 10 mg via INTRAVENOUS
  Administered 2023-02-10: 15 mg via INTRAVENOUS
  Administered 2023-02-10 (×2): 10 mg via INTRAVENOUS

## 2023-02-10 MED ORDER — SUGAMMADEX SODIUM 200 MG/2ML IV SOLN
INTRAVENOUS | Status: DC | PRN
  Administered 2023-02-10: 150 mg via INTRAVENOUS
  Administered 2023-02-10: 50 mg via INTRAVENOUS

## 2023-02-10 MED ORDER — PHENYLEPHRINE 80 MCG/ML (10ML) SYRINGE FOR IV PUSH (FOR BLOOD PRESSURE SUPPORT)
PREFILLED_SYRINGE | INTRAVENOUS | Status: AC
  Filled 2023-02-10: qty 10

## 2023-02-10 MED ORDER — DIPHENHYDRAMINE HCL 50 MG/ML IJ SOLN: 12.5000 mg | Freq: Four times a day (QID) | INTRAMUSCULAR | Status: DC | PRN

## 2023-02-10 MED ORDER — FAMOTIDINE 20 MG PO TABS
ORAL_TABLET | ORAL | Status: AC
  Filled 2023-02-10: qty 1

## 2023-02-10 MED ORDER — DIAZEPAM 5 MG/ML IJ SOLN
2.5000 mg | Freq: Four times a day (QID) | INTRAMUSCULAR | Status: DC | PRN
  Administered 2023-02-10: 2.5 mg via INTRAVENOUS
  Filled 2023-02-10: qty 2

## 2023-02-10 MED ORDER — MORPHINE SULFATE (PF) 4 MG/ML IV SOLN: 4.0000 mg | INTRAVENOUS | Status: DC | PRN

## 2023-02-10 MED ORDER — ONDANSETRON 4 MG PO TBDP: 4.0000 mg | ORAL_TABLET | Freq: Four times a day (QID) | ORAL | Status: DC | PRN

## 2023-02-10 MED ORDER — BUPIVACAINE-EPINEPHRINE (PF) 0.5% -1:200000 IJ SOLN
INTRAMUSCULAR | Status: DC | PRN
  Administered 2023-02-10: 30 mL

## 2023-02-10 MED ORDER — SODIUM CHLORIDE 0.9 % IV SOLN
INTRAVENOUS | Status: AC
  Filled 2023-02-10: qty 2

## 2023-02-10 MED ORDER — CHLORHEXIDINE GLUCONATE 0.12 % MT SOLN
OROMUCOSAL | Status: AC
  Filled 2023-02-10: qty 15

## 2023-02-10 MED ORDER — ACETAMINOPHEN 10 MG/ML IV SOLN
INTRAVENOUS | Status: AC
  Filled 2023-02-10: qty 100

## 2023-02-10 MED ORDER — DEXAMETHASONE SODIUM PHOSPHATE 10 MG/ML IJ SOLN
INTRAMUSCULAR | Status: DC | PRN
  Administered 2023-02-10: 10 mg via INTRAVENOUS

## 2023-02-10 MED ORDER — PROPOFOL 10 MG/ML IV BOLUS
INTRAVENOUS | Status: DC | PRN
  Administered 2023-02-10: 200 mg via INTRAVENOUS

## 2023-02-10 MED ORDER — EPHEDRINE 5 MG/ML INJ
INTRAVENOUS | Status: AC
  Filled 2023-02-10: qty 5

## 2023-02-10 MED ORDER — DEXMEDETOMIDINE HCL IN NACL 80 MCG/20ML IV SOLN
INTRAVENOUS | Status: AC
  Filled 2023-02-10: qty 20

## 2023-02-10 MED ORDER — KETOROLAC TROMETHAMINE 30 MG/ML IJ SOLN
30.0000 mg | Freq: Four times a day (QID) | INTRAMUSCULAR | Status: DC
  Administered 2023-02-10 – 2023-02-11 (×4): 30 mg via INTRAVENOUS
  Filled 2023-02-10 (×4): qty 1

## 2023-02-10 MED ORDER — LIDOCAINE HCL (CARDIAC) PF 100 MG/5ML IV SOSY
PREFILLED_SYRINGE | INTRAVENOUS | Status: DC | PRN
  Administered 2023-02-10: 20 mg via INTRAVENOUS

## 2023-02-10 MED ORDER — GLYCOPYRROLATE 0.2 MG/ML IJ SOLN
INTRAMUSCULAR | Status: DC | PRN
  Administered 2023-02-10: .2 mg via INTRAVENOUS

## 2023-02-10 MED ORDER — MIDAZOLAM HCL 2 MG/ML PO SYRP
ORAL_SOLUTION | ORAL | Status: AC
  Filled 2023-02-10: qty 5

## 2023-02-10 MED ORDER — HALOPERIDOL LACTATE 5 MG/ML IJ SOLN: 5.0000 mg | Freq: Four times a day (QID) | INTRAMUSCULAR | Status: DC | PRN

## 2023-02-10 MED ORDER — PHENYLEPHRINE 80 MCG/ML (10ML) SYRINGE FOR IV PUSH (FOR BLOOD PRESSURE SUPPORT)
PREFILLED_SYRINGE | INTRAVENOUS | Status: DC | PRN
  Administered 2023-02-10: 80 ug via INTRAVENOUS
  Administered 2023-02-10: 160 ug via INTRAVENOUS
  Administered 2023-02-10 (×2): 80 ug via INTRAVENOUS

## 2023-02-10 MED ORDER — OXYCODONE HCL 5 MG PO TABS: 5.0000 mg | ORAL_TABLET | ORAL | Status: DC | PRN

## 2023-02-10 MED ORDER — GABAPENTIN 300 MG PO CAPS
ORAL_CAPSULE | ORAL | Status: AC
  Filled 2023-02-10: qty 1

## 2023-02-10 MED ORDER — MIDAZOLAM HCL 2 MG/ML PO SYRP
10.0000 mg | ORAL_SOLUTION | Freq: Once | ORAL | Status: AC
  Administered 2023-02-10: 10 mg via ORAL

## 2023-02-10 MED ORDER — DIPHENHYDRAMINE HCL 12.5 MG/5ML PO ELIX: 12.5000 mg | ORAL_SOLUTION | Freq: Four times a day (QID) | ORAL | Status: DC | PRN

## 2023-02-10 MED ORDER — OXYCODONE HCL 5 MG PO TABS: 5.0000 mg | ORAL_TABLET | Freq: Once | ORAL | Status: DC | PRN

## 2023-02-10 MED ORDER — HYDROMORPHONE HCL 1 MG/ML IJ SOLN
0.5000 mg | INTRAMUSCULAR | Status: DC | PRN
  Administered 2023-02-10 (×2): 0.5 mg via INTRAVENOUS

## 2023-02-10 MED ORDER — KETAMINE HCL 100 MG/ML IJ SOLN
3.0000 mg/kg | Freq: Once | INTRAMUSCULAR | Status: AC
  Administered 2023-02-10: 230 mg via INTRAMUSCULAR
  Filled 2023-02-10: qty 2.3

## 2023-02-10 MED ORDER — ENOXAPARIN SODIUM 40 MG/0.4ML IJ SOSY: 40.0000 mg | PREFILLED_SYRINGE | INTRAMUSCULAR | Status: DC

## 2023-02-10 MED ORDER — MIDAZOLAM HCL 2 MG/2ML IJ SOLN
INTRAMUSCULAR | Status: DC | PRN
  Administered 2023-02-10 (×2): 1 mg via INTRAVENOUS

## 2023-02-10 MED ORDER — PANTOPRAZOLE SODIUM 40 MG IV SOLR
40.0000 mg | Freq: Every day | INTRAVENOUS | Status: DC
  Administered 2023-02-10: 40 mg via INTRAVENOUS
  Filled 2023-02-10: qty 10

## 2023-02-10 MED ORDER — MIDAZOLAM HCL 2 MG/ML PO SYRP: 5.0000 mg | ORAL_SOLUTION | Freq: Once | ORAL | Status: DC

## 2023-02-10 MED ORDER — PHENOBARBITAL 32.4 MG PO TABS
64.8000 mg | ORAL_TABLET | Freq: Two times a day (BID) | ORAL | Status: DC
  Administered 2023-02-10 – 2023-02-11 (×2): 64.8 mg via ORAL
  Filled 2023-02-10 (×2): qty 2

## 2023-02-10 MED ORDER — GLYCOPYRROLATE 0.2 MG/ML IJ SOLN
INTRAMUSCULAR | Status: AC
  Filled 2023-02-10: qty 1

## 2023-02-10 MED ORDER — MIDAZOLAM HCL 2 MG/2ML IJ SOLN
2.0000 mg | INTRAMUSCULAR | Status: AC | PRN
  Administered 2023-02-10: 2 mg via INTRAVENOUS

## 2023-02-10 MED ORDER — ONDANSETRON HCL 4 MG/2ML IJ SOLN
INTRAMUSCULAR | Status: DC | PRN
  Administered 2023-02-10: 4 mg via INTRAVENOUS

## 2023-02-10 MED ORDER — PHENYLEPHRINE HCL-NACL 20-0.9 MG/250ML-% IV SOLN
INTRAVENOUS | Status: AC
  Filled 2023-02-10: qty 250

## 2023-02-10 MED ORDER — MIDAZOLAM HCL 2 MG/2ML IJ SOLN
INTRAMUSCULAR | Status: AC
  Filled 2023-02-10: qty 2

## 2023-02-10 MED ORDER — ALPRAZOLAM 0.5 MG PO TABS: 0.5000 mg | ORAL_TABLET | Freq: Three times a day (TID) | ORAL | Status: DC | PRN

## 2023-02-10 MED ORDER — DEXMEDETOMIDINE HCL IN NACL 200 MCG/50ML IV SOLN
INTRAVENOUS | Status: DC | PRN
  Administered 2023-02-10: 8 ug via INTRAVENOUS
  Administered 2023-02-10 (×2): 12 ug via INTRAVENOUS
  Administered 2023-02-10: 8 ug via INTRAVENOUS

## 2023-02-10 MED ORDER — KETAMINE HCL 50 MG/ML IJ SOLN: 0.5000 mg/kg | Freq: Three times a day (TID) | INTRAMUSCULAR | Status: DC | PRN

## 2023-02-10 MED ORDER — CARBAMAZEPINE 200 MG PO TABS
400.0000 mg | ORAL_TABLET | Freq: Every day | ORAL | Status: DC
  Administered 2023-02-11: 400 mg via ORAL
  Filled 2023-02-10: qty 2

## 2023-02-10 MED ORDER — ALVIMOPAN 12 MG PO CAPS
12.0000 mg | ORAL_CAPSULE | Freq: Once | ORAL | Status: DC
  Filled 2023-02-10: qty 1

## 2023-02-10 MED ORDER — FAMOTIDINE 20 MG PO TABS: 20.0000 mg | ORAL_TABLET | Freq: Once | ORAL | Status: DC

## 2023-02-10 MED ORDER — ROCURONIUM BROMIDE 100 MG/10ML IV SOLN
INTRAVENOUS | Status: DC | PRN
  Administered 2023-02-10 (×2): 10 mg via INTRAVENOUS
  Administered 2023-02-10: 30 mg via INTRAVENOUS
  Administered 2023-02-10: 70 mg via INTRAVENOUS

## 2023-02-10 MED ORDER — FENTANYL CITRATE (PF) 100 MCG/2ML IJ SOLN: 25.0000 ug | INTRAMUSCULAR | Status: DC | PRN

## 2023-02-10 MED ORDER — DROPERIDOL 2.5 MG/ML IJ SOLN: 0.6250 mg | Freq: Once | INTRAMUSCULAR | Status: DC | PRN

## 2023-02-10 MED ORDER — ZOLPIDEM TARTRATE 5 MG PO TABS: 5.0000 mg | ORAL_TABLET | Freq: Every evening | ORAL | Status: DC | PRN

## 2023-02-10 MED ORDER — DEXAMETHASONE SODIUM PHOSPHATE 10 MG/ML IJ SOLN
INTRAMUSCULAR | Status: AC
  Filled 2023-02-10: qty 1

## 2023-02-10 MED ORDER — SODIUM CHLORIDE 0.9 % IV SOLN
2.0000 g | Freq: Two times a day (BID) | INTRAVENOUS | Status: AC
  Administered 2023-02-10 – 2023-02-11 (×2): 2 g via INTRAVENOUS
  Filled 2023-02-10 (×2): qty 2

## 2023-02-10 MED ORDER — SODIUM CHLORIDE 0.9 % IV SOLN: INTRAVENOUS | Status: DC | PRN

## 2023-02-10 MED ORDER — LACTATED RINGERS IV SOLN: INTRAVENOUS | Status: DC | PRN

## 2023-02-10 MED ORDER — SODIUM CHLORIDE 0.9 % IV SOLN: INTRAVENOUS | Status: DC

## 2023-02-10 MED ORDER — ACETAMINOPHEN 10 MG/ML IV SOLN
INTRAVENOUS | Status: DC | PRN
  Administered 2023-02-10: 1000 mg via INTRAVENOUS

## 2023-02-10 MED ORDER — LIDOCAINE HCL (PF) 2 % IJ SOLN
INTRAMUSCULAR | Status: AC
  Filled 2023-02-10: qty 5

## 2023-02-10 MED ORDER — ALVIMOPAN 12 MG PO CAPS
ORAL_CAPSULE | ORAL | Status: AC
  Filled 2023-02-10: qty 1

## 2023-02-10 MED ORDER — PROPOFOL 10 MG/ML IV BOLUS
INTRAVENOUS | Status: AC
  Filled 2023-02-10: qty 20

## 2023-02-10 SURGICAL SUPPLY — 87 items
ADH SKN CLS APL DERMABOND .7 (GAUZE/BANDAGES/DRESSINGS) ×2
APL SWBSTK 6 STRL LF DISP (MISCELLANEOUS)
APPLICATOR COTTON TIP 6 STRL (MISCELLANEOUS) ×1 IMPLANT
APPLICATOR COTTON TIP 6IN STRL (MISCELLANEOUS)
APPLIER CLIP 5 13 M/L LIGAMAX5 (MISCELLANEOUS) ×1
APR CLP MED LRG 5 ANG JAW (MISCELLANEOUS) ×1
BARRIER ADH SEPRAFILM 3INX5IN (MISCELLANEOUS) IMPLANT
BLADE SURG SZ10 CARB STEEL (BLADE) ×1 IMPLANT
BRR ADH 5X3 SEPRAFILM 2 SHT (MISCELLANEOUS) ×2
CATH REDDICK CHOLANGI 4FR 50CM (CATHETERS) IMPLANT
CLIP APPLIE 5 13 M/L LIGAMAX5 (MISCELLANEOUS) ×1 IMPLANT
DERMABOND ADVANCED .7 DNX12 (GAUZE/BANDAGES/DRESSINGS) ×2 IMPLANT
DRAPE C-ARM XRAY 36X54 (DRAPES) ×2 IMPLANT
DRAPE INCISE IOBAN 66X45 STRL (DRAPES) ×1 IMPLANT
ELECT CAUTERY BLADE 6.4 (BLADE) ×2 IMPLANT
ELECT CAUTERY BLADE TIP 2.5 (TIP) ×1
ELECT REM PT RETURN 9FT ADLT (ELECTROSURGICAL) ×1
ELECTRODE CAUTERY BLDE TIP 2.5 (TIP) ×1 IMPLANT
ELECTRODE REM PT RTRN 9FT ADLT (ELECTROSURGICAL) ×1 IMPLANT
GLOVE BIO SURGEON STRL SZ7 (GLOVE) ×3 IMPLANT
GOWN STRL REUS W/ TWL LRG LVL3 (GOWN DISPOSABLE) ×4 IMPLANT
GOWN STRL REUS W/TWL LRG LVL3 (GOWN DISPOSABLE) ×10
HANDLE SUCTION POOLE (INSTRUMENTS) ×1 IMPLANT
HANDLE YANKAUER SUCT BULB TIP (MISCELLANEOUS) ×1 IMPLANT
HOLDER FOLEY CATH W/STRAP (MISCELLANEOUS) ×1 IMPLANT
IRRIGATION STRYKERFLOW (MISCELLANEOUS) ×1 IMPLANT
IRRIGATOR STRYKERFLOW (MISCELLANEOUS) ×1
IV CATH ANGIO 12GX3 LT BLUE (NEEDLE) IMPLANT
IV NS 1000ML (IV SOLUTION) ×1
IV NS 1000ML BAXH (IV SOLUTION) ×1 IMPLANT
L-HOOK LAP DISP 36CM (ELECTROSURGICAL) ×1
LHOOK LAP DISP 36CM (ELECTROSURGICAL) ×1 IMPLANT
MANIFOLD NEPTUNE II (INSTRUMENTS) ×1 IMPLANT
NDL HYPO 22X1.5 SAFETY MO (MISCELLANEOUS) ×1 IMPLANT
NEEDLE HYPO 22X1.5 SAFETY MO (MISCELLANEOUS) ×1 IMPLANT
NS IRRIG 1000ML POUR BTL (IV SOLUTION) ×1 IMPLANT
NS IRRIG 500ML POUR BTL (IV SOLUTION) ×1 IMPLANT
PACK COLON CLEAN CLOSURE (MISCELLANEOUS) ×1 IMPLANT
PACK LAP CHOLECYSTECTOMY (MISCELLANEOUS) ×1 IMPLANT
PENCIL SMOKE EVACUATOR (MISCELLANEOUS) ×1 IMPLANT
RELOAD PROXIMATE 75MM BLUE (ENDOMECHANICALS) IMPLANT
RELOAD STAPLE 60 2.6 WHT THN (STAPLE) IMPLANT
RELOAD STAPLE 60 3.6 BLU REG (STAPLE) IMPLANT
RELOAD STAPLE 75 3.8 BLU REG (ENDOMECHANICALS) IMPLANT
RELOAD STAPLER BLUE 60MM (STAPLE) ×3 IMPLANT
RELOAD STAPLER WHITE 60MM (STAPLE) ×2 IMPLANT
RETRACTOR WOUND ALXS 18CM MED (MISCELLANEOUS) IMPLANT
RTRCTR WOUND ALEXIS O 18CM MED (MISCELLANEOUS)
SCISSORS METZENBAUM CVD 33 (INSTRUMENTS) ×1 IMPLANT
SET TUBE SMOKE EVAC HIGH FLOW (TUBING) ×1 IMPLANT
SHEARS HARMONIC ACE PLUS 36CM (ENDOMECHANICALS) ×1 IMPLANT
SLEEVE Z-THREAD 5X100MM (TROCAR) ×2 IMPLANT
SPIKE FLUID TRANSFER (MISCELLANEOUS) ×2 IMPLANT
SPONGE T-LAP 18X18 ~~LOC~~+RFID (SPONGE) ×3 IMPLANT
SPONGE T-LAP 18X36 ~~LOC~~+RFID STR (SPONGE) ×1 IMPLANT
STAPLE ECHEON FLEX 60 POW ENDO (STAPLE) IMPLANT
STAPLER PROXIMATE 75MM BLUE (STAPLE) IMPLANT
STAPLER RELOAD BLUE 60MM (STAPLE) ×3
STAPLER RELOAD WHITE 60MM (STAPLE) ×2
STOPCOCK 4 WAY LG BORE MALE ST (IV SETS) IMPLANT
SUCTION POOLE HANDLE (INSTRUMENTS)
SUT ETHIBOND 0 MO6 C/R (SUTURE) IMPLANT
SUT MNCRL 4-0 (SUTURE) ×1
SUT MNCRL 4-0 27XMFL (SUTURE) ×1
SUT MNCRL AB 4-0 PS2 18 (SUTURE) ×1 IMPLANT
SUT PDS AB 0 CT1 27 (SUTURE) ×2 IMPLANT
SUT SILK 2 0 (SUTURE) ×1
SUT SILK 2 0 SH CR/8 (SUTURE) ×1 IMPLANT
SUT SILK 2-0 30XBRD TIE 12 (SUTURE) ×1 IMPLANT
SUT V-LOC 90 ABS 3-0 VLT  V-20 (SUTURE) ×1
SUT V-LOC 90 ABS 3-0 VLT V-20 (SUTURE) IMPLANT
SUT VIC AB 2-0 SH 27 (SUTURE) ×2
SUT VIC AB 2-0 SH 27XBRD (SUTURE) IMPLANT
SUT VICRYL 0 UR6 27IN ABS (SUTURE) ×2 IMPLANT
SUTURE MNCRL 4-0 27XMF (SUTURE) ×2 IMPLANT
SYR 20ML LL LF (SYRINGE) ×1 IMPLANT
SYS BAG RETRIEVAL 10MM (BASKET) ×1
SYS LAPSCP GELPORT 120MM (MISCELLANEOUS) ×2
SYSTEM BAG RETRIEVAL 10MM (BASKET) ×1 IMPLANT
SYSTEM LAPSCP GELPORT 120MM (MISCELLANEOUS) ×1 IMPLANT
TOWEL OR 17X26 4PK STRL BLUE (TOWEL DISPOSABLE) ×1 IMPLANT
TRAP FLUID SMOKE EVACUATOR (MISCELLANEOUS) ×1 IMPLANT
TRAY FOLEY MTR SLVR 16FR STAT (SET/KITS/TRAYS/PACK) ×1 IMPLANT
TROCAR BALLN 12MMX100 BLUNT (TROCAR) ×1 IMPLANT
TROCAR Z-THREAD FIOS 5X100MM (TROCAR) ×1 IMPLANT
TUBING EVAC SMOKE HEATED PNEUM (TUBING) ×1 IMPLANT
WATER STERILE IRR 500ML POUR (IV SOLUTION) ×1 IMPLANT
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
## 2023-02-10 NOTE — Anesthesia Procedure Notes (Signed)
Procedure Name: Intubation Date/Time: 02/10/2023 7:50 AM  Performed by: Lily Lovings, CRNAPre-anesthesia Checklist: Patient identified, Patient being monitored, Timeout performed, Emergency Drugs available and Suction available Patient Re-evaluated:Patient Re-evaluated prior to induction Oxygen Delivery Method: Circle system utilized Preoxygenation: Pre-oxygenation with 100% oxygen Induction Type: IV induction Ventilation: Mask ventilation without difficulty and Two handed mask ventilation required Laryngoscope Size: 3 and McGraph Grade View: Grade I Tube type: Oral Tube size: 7.5 mm Number of attempts: 1 Airway Equipment and Method: Stylet Placement Confirmation: ETT inserted through vocal cords under direct vision, positive ETCO2 and breath sounds checked- equal and bilateral Secured at: 22 cm Tube secured with: Tape Dental Injury: Teeth and Oropharynx as per pre-operative assessment

## 2023-02-10 NOTE — Transfer of Care (Signed)
Immediate Anesthesia Transfer of Care Note  Patient: Alec Campbell  Procedure(s) Performed: LAPAROSCOPIC RIGHT COLECTOMY, hand assisted, RNFA to assist LAPAROSCOPIC CHOLECYSTECTOMY  Patient Location: PACU  Anesthesia Type:General  Level of Consciousness: sedated and drowsy  Airway & Oxygen Therapy: Patient Spontanous Breathing and Patient connected to face mask oxygen  Post-op Assessment: Report given to RN and Post -op Vital signs reviewed and stable  Post vital signs: Reviewed and stable  Last Vitals:  Vitals Value Taken Time  BP 98/61 02/10/23 1205  Temp    Pulse 70 02/10/23 1208  Resp 14 02/10/23 1208  SpO2 98 % 02/10/23 1208  Vitals shown include unvalidated device data.  Last Pain:  Vitals:   02/10/23 0622  TempSrc: Temporal  PainSc: 0-No pain         Complications: No notable events documented.

## 2023-02-10 NOTE — Interval H&P Note (Signed)
History and Physical Interval Note:  02/10/2023 7:32 AM  Alec Campbell  has presented today for surgery, with the diagnosis of colon mass.  The various methods of treatment have been discussed with the patient and family. After consideration of risks, benefits and other options for treatment, the patient has consented to  Procedure(s): LAPAROSCOPIC RIGHT COLECTOMY, hand assisted, RNFA to assist (N/A) LAPAROSCOPIC CHOLECYSTECTOMY (N/A) as a surgical intervention.  The patient's history has been reviewed, patient examined, no change in status, stable for surgery.  I have reviewed the patient's chart and labs.  Questions were answered to the patient's satisfaction.   We had a very difficult time trying to get IV to the pt, he was combative due to his baseline neurological and cognitive condition. After Ketamine was given he calm down. THis required significant time and effort from all of the staff and delayed the start. Mother now wishes for me to remove the GB as multiple family member had issues. I have d/w the mother in detail regarding risks and benefits of cholecystectomy as well. She wishes to proceed.  Raylee Strehl F Salih Williamson

## 2023-02-10 NOTE — Op Note (Addendum)
PROCEDURES: 1. Hand assisted Laparoscopic Right Hemicolectomy With stapled ileocolostomy 2. Laparoscopic cholecystectomy  Pre-operative Diagnosis: Right Colon CA, cholelithiasis  Post-operative Diagnosis: Same  Surgeon: Thresea Doble F Mansa Willers   Assistants: Rowe Pavy RNFA Required due to the complexity of the case: for exposure and creation of the anastomosis  Anesthesia: General endotracheal anesthesia  ASA Class: 2  Surgeon: Sterling Big , MD FACS  Anesthesia: Gen. with endotracheal tube   Findings: Right colon tattoo, no evidence of distant metastasis Tension free anastomosis, no evidence of intraop leak and good perfusion Chronic cholecystitis  Estimated Blood Loss: 50cc         Drains: none         Specimens: Right colon          Complications: none          Procedure Details  The patient was seen again in the Holding Room. The benefits, complications, treatment options, and expected outcomes were discussed with the patient. The risks of bleeding, infection, recurrence of symptoms, failure to resolve symptoms,  bowel injury, any of which could require further surgery were reviewed with the patient.   The patient was taken to Operating Room, identified  and the procedure verified.  A Time Out was held and the above information confirmed.  Prior to the induction of general anesthesia, antibiotic prophylaxis was administered. VTE prophylaxis was in place. General endotracheal anesthesia was then administered and tolerated well. After the induction, the abdomen was prepped with Chloraprep and draped in the sterile fashion. The patient was positioned in the supine position.  7 cm incision was created as a midline mini laparotomy. The abdominal cavity was entered under direct visualization and the GelPort device was placed. three 5 mm ports were placed  under direct visualization and pneumoperitoneum was obtained.  There were no hemodynamic changes.The greater omentum was divided and  the hepatic flexure was taken down using harmonic scalpel.  The white line of Toldt was incised and a lateral to medial dissection was performed.  We identified the right ureter as well as the duodenum and preserve both structures at all times. Were also able to mobilize the attachments of the cecum and terminal ileum.  Once we had an adequate mobilization were able to remove the GelPort and exteriorized the right colon.  A 10 cm margin on the terminal ileum was identified and we created a window with electrocautery and divided the terminal ileum.  Attention then was turned to the distal excision margin.  We identified the middle colic artery on selected a spot right to the middle colic artery.  We Were able to also use a 60 mm echelon stapler to divide this area.  The mesentery was scored with electrocautery.  We identified the right colic artery and divided w a vascular echelon stapler in the standard fashion.  The rest of the mesentery was divided using the harmonic scalpel.  Please note that we went as low as possible to the base of the mesentery to obtain adequate lymph nodes and adequate margins of dissection. Specimen was passed and sent to permanent pathology.  A standard side-to-side functional end to end stapled anastomosis was created with  of a 60 mm echelon stapler device, common channel closed two layers w interrupted 2-0 vicryl and 3-0 vlock as a continuous connel..  We check for patency as well as leak.  There was a tension-free anastomosis with good perfusion and no evidence of intraoperative leak.   Attention turned to the RUQ, The  GallBladder was elevated and the peritoneum scored, we dissected the cystic artery and cystic duct clearly and obtained a critical window of safety, artery and duct were double clipped and divided, The Gb was removed from the liver using cautery and removed. Distal portion of the greater omentum was devascularized and we decided to remove it to avoid any  complications.  This was done using harmonic scalpel.   We changed gloves and place a clean closure tray.   Liposomal Marcaine was injected throughout the abdominal wall on both sides under direct visualization and palpation.  The fascia was closed with a running 0 PDS using the small bite techniques.  Incisions were closed with 4-0 Monocryl in a subcuticular fashion.  Dermabond was used to coat the skin.  Needle and laparotomy counts were correct and there were no immediate complications     Sterling Bigiego Irelynd Zumstein, MD, FACS

## 2023-02-11 LAB — BASIC METABOLIC PANEL
Anion gap: 8 (ref 5–15)
BUN: 16 mg/dL (ref 6–20)
CO2: 26 mmol/L (ref 22–32)
Calcium: 8.3 mg/dL — ABNORMAL LOW (ref 8.9–10.3)
Chloride: 99 mmol/L (ref 98–111)
Creatinine, Ser: 1.03 mg/dL (ref 0.61–1.24)
GFR, Estimated: >60 mL/min (ref >60)
Glucose, Bld: 125 mg/dL — ABNORMAL HIGH (ref 70–99)
Potassium: 4 mmol/L (ref 3.5–5.1)
Sodium: 133 mmol/L — ABNORMAL LOW (ref 135–145)

## 2023-02-11 LAB — CBC
HCT: 31.8 % — ABNORMAL LOW (ref 39.0–52.0)
Hemoglobin: 10.9 g/dL — ABNORMAL LOW (ref 13.0–17.0)
MCH: 29.7 pg (ref 26.0–34.0)
MCHC: 34.3 g/dL (ref 30.0–36.0)
MCV: 86.6 fL (ref 80.0–100.0)
Platelets: 142 10*3/uL — ABNORMAL LOW (ref 150–400)
RBC: 3.67 MIL/uL — ABNORMAL LOW (ref 4.22–5.81)
RDW: 12.7 % (ref 11.5–15.5)
WBC: 10.9 10*3/uL — ABNORMAL HIGH (ref 4.0–10.5)
nRBC: 0 % (ref 0.0–0.2)

## 2023-02-11 LAB — HIV ANTIBODY (ROUTINE TESTING W REFLEX): HIV Screen 4th Generation wRfx: NONREACTIVE

## 2023-02-11 MED ORDER — ONDANSETRON 4 MG PO TBDP: 4.0000 mg | ORAL_TABLET | Freq: Four times a day (QID) | ORAL | 0 refills | Status: DC | PRN

## 2023-02-11 MED ORDER — IBUPROFEN 600 MG PO TABS: 600.0000 mg | ORAL_TABLET | Freq: Four times a day (QID) | ORAL | 0 refills | Status: DC | PRN

## 2023-02-11 MED ORDER — OXYCODONE HCL 5 MG PO TABS: 5.0000 mg | ORAL_TABLET | Freq: Four times a day (QID) | ORAL | 0 refills | Status: DC | PRN

## 2023-02-11 MED ORDER — PANTOPRAZOLE SODIUM 40 MG PO TBEC: 40.0000 mg | DELAYED_RELEASE_TABLET | Freq: Every day | ORAL | Status: DC

## 2023-02-11 NOTE — Anesthesia Postprocedure Evaluation (Addendum)
Anesthesia Post Note  Patient: Emigdio Duty  Procedure(s) Performed: LAPAROSCOPIC RIGHT COLECTOMY, hand assisted, RNFA to assist LAPAROSCOPIC CHOLECYSTECTOMY  Patient location during evaluation: PACU Anesthesia Type: General Level of consciousness: awake and alert Pain management: pain level controlled Vital Signs Assessment: post-procedure vital signs reviewed and stable Respiratory status: spontaneous breathing, nonlabored ventilation, respiratory function stable and patient connected to nasal cannula oxygen Cardiovascular status: blood pressure returned to baseline and stable Postop Assessment: no apparent nausea or vomiting Anesthetic complications: no   No notable events documented.   Last Vitals:  Vitals:   02/10/23 2022 02/11/23 0348  BP: 110/68 134/73  Pulse: 94 (!) 102  Resp: 20 20  Temp: 37.3 C 36.7 C  SpO2: 90% 95%    Last Pain:  Vitals:   02/11/23 0348  TempSrc: Oral  PainSc:                  Stephanie Coup

## 2023-02-11 NOTE — Progress Notes (Signed)
Patient and family including legal guardian present at bedside, education and materials provided on discharge information. Iv removed with no complications. Patient escorted off unit with belognings, family and legal guardian, and escorted by member of volunteer staff

## 2023-02-11 NOTE — Discharge Instructions (Signed)
In addition to included general post-operative instructions,  Diet: Resume home diet.   Activity: No heavy lifting >20 pounds (children, pets, laundry, garbage) or strenuous activity for 4 weeks, but light activity and walking are encouraged. Do not drive or drink alcohol if taking narcotic pain medications or having pain that might distract from driving.  Wound care: 2 days after surgery (04/13), you may shower/get incision wet with soapy water and pat dry (do not rub incisions), but no baths or submerging incision underwater until follow-up.   Medications: Resume all home medications. For mild to moderate pain: acetaminophen (Tylenol) or ibuprofen/naproxen (if no kidney disease). Combining Tylenol with alcohol can substantially increase your risk of causing liver disease. Narcotic pain medications, if prescribed, can be used for severe pain, though may cause nausea, constipation, and drowsiness. Do not combine Tylenol and Percocet (or similar) within a 6 hour period as Percocet (and similar) contain(s) Tylenol. If you do not need the narcotic pain medication, you do not need to fill the prescription.  Call office [phone] / [phone]) at any time if any questions, worsening pain, fevers/chills, bleeding, drainage from incision site, or other concerns.

## 2023-02-11 NOTE — Progress Notes (Signed)
[facility] SUR[facility]ICAL PROGRESS NOTE  Hospital Day(s): 1.   Post op day(s): 1 Day Post-Op.   Interval History:  Patient seen and examined No acute events or new complaints overnight.  Patient resting comfortably; he has no complaints but limited due to cognitive delay Mother at bedside states he seems comfortable; family overnight noted repeated flatus He has had no emesis Asking for eggs this morning  Leukocytosis seen post-operatively is resolved; now 10.9K Hgb 10.9; likely dilutional Renal function normal; sCr - 1.03; UO - 1050 ccs No electrolyte derangements On FLD: tolerating Mother will get him to ambulate today  Vital signs in last 24 hours: [min-max] current  Temp:  [97.2 F (36.2 C)-99.2 F (37.3 C)] 98.1 F (36.7 C) (04/12 0348) Pulse Rate:  [55-102] 102 (04/12 0348) Resp:  [11-20] 20 (04/12 0348) BP: (80-134)/(37-113) 134/73 (04/12 0348) SpO2:  [90 %-100 %] 95 % (04/12 0348)     Height: 5\' 10"  (177.8 cm) Weight: 77.1 kg BMI (Calculated): 24.39   Intake/Output last 2 shifts:  04/11 0701 - 04/12 0700 In: 3718.4 [P.O.:270; I.V.:3148.4; IV Piggyback:300] Out: 1100 [Urine:1050; Blood:50]   Physical Exam:  Constitutional: alert, cooperative and no distress  Respiratory: breathing non-labored at rest  Cardiovascular: regular rate and sinus rhythm  Gastrointestinal: Soft, he does not appear tender on examination although somewhat limited by cognition, non-distended, no tympany, no rebound/guarding Integumentary: Laparoscopic and mini-laparotomy incision is CDI with dermabond, no erythema or drainage   Labs:     Latest Ref Rng & Units 02/11/2023    3:39 AM 02/10/2023    3:20 PM 12/10/2022   11:40 AM  CBC  WBC 4.0 - 10.5 K/uL 10.9  19.0  6.2   Hemoglobin 13.0 - 17.0 g/dL 75.1  02.5  85.2   Hematocrit 39.0 - 52.0 % 31.8  34.6  38.8   Platelets 150 - 400 K/uL 142  178  178       Latest Ref Rng & Units 02/11/2023    3:39 AM 02/10/2023    3:20 PM  12/10/2022   11:40 AM  CMP  Glucose 70 - 99 mg/dL 778   56   BUN 6 - 20 mg/dL 16   13   Creatinine 2.42 - 1.24 mg/dL 3.53  6.14  4.31   Sodium 135 - 145 mmol/L 133   140   Potassium 3.5 - 5.1 mmol/L 4.0   3.8   Chloride 98 - 111 mmol/L 99   102   CO2 22 - 32 mmol/L 26   26   Calcium 8.9 - 10.3 mg/dL 8.3   9.3   Total Protein 6.1 - 8.1 g/dL   6.9   Total Bilirubin 0.2 - 1.2 mg/dL   0.3   AST 10 - 35 U/L   14   ALT 9 - 46 U/L   13      Imaging studies: No new pertinent imaging studies   Assessment/Plan:  52 y.o. male doing well 1 Day Post-Op s/p hand assisted laparoscopic right hemicolectomy and cholecystectomy for right colon cancer and cholelithiasis, complicated by pertinent comorbidities including developmental delay.   - I think it is reasonable to trial soft diet this morning  - Stop IVF  - Complete peri-operative Abx  - Discontinue entereg  - Okay to resume home medications   - Monitor abdominal examination; on-going bowel function   - Anxiolytic prn  - Pain control prn; antiemetics prn   - Mobilize; encourage mother to get up with  staff first   - Discharge Planning: Doing well, ROBF. Diet advancing. Potentially home in next 24-48 hours.   All of the above findings and recommendations were discussed with the patient's family (mother), and the medical team, and all of their questions were answered to their expressed satisfaction.  -- Lynden Oxford, PA-C [facility] 02/11/2023, 7:35 AM M-F: 7am - 4pm

## 2023-02-11 NOTE — Discharge Summary (Signed)
[facility] SUR[facility]ICAL DISCHARGE SUMMARY  Patient ID: Alec Campbell MRN: [id number] DOB/AGE: 02-Jul-1971 52 y.o.  Admit date: 02/10/2023 Discharge date: 02/11/2023  Discharge Diagnoses Patient Active Problem List   Diagnosis Date Noted   S/P partial resection of colon 02/10/2023   Calculus of gallbladder with chronic cholecystitis without obstruction 02/10/2023   Colonic mass 01/13/2023    Consultants None  Procedures 02/10/2023:  HPI: Alec Campbell is a 52 y.o. male with history of unresectable ascending colon mass as well as cholelithiasis   Hospital Course: Informed consent was obtained and documented, and patient underwent uneventful hand assisted laparoscopic right colectomy and cholecystectomy (dr Everlene Farrier, 02/10/2023).  Post-operatively, patient did very well. Given his intellectual disability, we did pursue earlier discharge to ensure patient comfort. He did tolerate advancement of diet with ROBF and ambulation were well-tolerated. The remainder of patient's hospital course was essentially unremarkable, and discharge planning was initiated accordingly with patient safely able to be discharged home with appropriate discharge instructions, pain control, and outpatient follow-up after all of his mother's questions were answered to her expressed satisfaction.   Discharge Condition: Good    Allergies as of 02/11/2023   No Known Allergies      Medication List     STOP taking these medications    metroNIDAZOLE 500 MG tablet Commonly known as: FLAGYL   neomycin 500 MG tablet Commonly known as: MYCIFRADIN   polyethylene glycol powder 17 GM/SCOOP powder Commonly known as: GLYCOLAX/MIRALAX       TAKE these medications    carbamazepine 200 MG tablet Commonly known as: TEGRETOL TAKE 2 TABLETS BY MOUTH IN THE MORNING, THEN TAKE  1 TABLET AT LUNCH AND TAKE 1 AT NIGHT   ibuprofen 600 MG tablet Commonly known as: ADVIL Take 1 tablet (600 mg total) by mouth  every 6 (six) hours as needed.   loratadine 10 MG tablet Commonly known as: CLARITIN Take 1 tablet (10 mg total) by mouth daily. What changed:  when to take this reasons to take this   multivitamin tablet Take 1 tablet by mouth daily.   ondansetron 4 MG disintegrating tablet Commonly known as: ZOFRAN-ODT Take 1 tablet (4 mg total) by mouth every 6 (six) hours as needed for nausea.   oxyCODONE 5 MG immediate release tablet Commonly known as: Oxy IR/ROXICODONE Take 1 tablet (5 mg total) by mouth every 6 (six) hours as needed for severe pain or breakthrough pain.   PHENobarbital 64.8 MG tablet Commonly known as: LUMINAL Take 1 tablet (64.8 mg total) by mouth 2 (two) times daily.   PreviDent 5000 Booster Plus 1.1 % Pste Generic drug: Sodium Fluoride See admin instructions.   PreviDent 5000 Sensitive 1.1-5 % Gel Generic drug: Sod Fluoride-Potassium Nitrate Place onto teeth 2 (two) times daily.   rosuvastatin 10 MG tablet Commonly known as: Crestor Take 1 tablet (10 mg total) by mouth daily.   Sutab [phone] MG Tabs Generic drug: Sodium Sulfate-Mag Sulfate-KCl At 5 PM take 12 tablets using the 8 oz cup provided in the kit drinking 5 cups of water and 5 hours before your procedure repeat the same process.   valACYclovir 1000 MG tablet Commonly known as: VALTREX TAKE 1 TABLET BY MOUTH TWICE DAILY AS NEEDED FOR  OUTBREAK          Follow-up Information     Pabon, [place] F, MD. Schedule an appointment as soon as possible for a visit in 2 week(s).   Specialty: General Surgery Why: s/p right colectomy and cholecystectomy  Contact information: [address] [place] [address] [phone]                  Time spent on discharge management including discussion of hospital course, clinical condition, outpatient instructions, prescriptions, and follow up with the patient and members of the medical team: >30 minutes  -- Lynden Oxford ,  PA-C [facility]  02/11/2023, 12:10 PM [phone] M-F: 7am - 4pm

## 2023-02-11 NOTE — TOC CM/SW Note (Signed)
Patient has orders to discharge home today. Chart reviewed. No TOC needs identified. CSW signing off.  Carleton Van[place], CSW [phone]  

## 2023-02-11 NOTE — Progress Notes (Signed)
PHARMACIST - PHYSICIAN COMMUNICATION  CONCERNING: IV to Oral Route Change Policy  RECOMMENDATION: This patient is receiving pantoprazole by the intravenous route.  Based on criteria approved by the Pharmacy and Therapeutics Committee, the intravenous medication(s) is/are being converted to the equivalent oral dose form(s).  DESCRIPTION: These criteria include: The patient is eating (either orally or via tube) and/or has been taking other orally administered medications for a least 24 hours The patient has no evidence of active gastrointestinal bleeding or impaired GI absorption (gastrectomy, short bowel, patient on TNA or NPO).  If you have questions about this conversion, please contact the Pharmacy Department   Tressie Ellis, [facility] 02/11/2023 11:18 AM

## 2023-02-14 ENCOUNTER — Telehealth: Payer: Self-pay | Admitting: *Deleted

## 2023-02-14 NOTE — Transitions of Care (Post Inpatient/ED Visit) (Signed)
   02/14/2023  Name: Alec Campbell MRN: [id number] DOB: 06-14-1971  Today's TOC FU Call Status: Today's TOC FU Call Status:: Successful TOC FU Call Competed TOC FU Call Complete Date: 02/14/23  Transition Care Management Follow-up Telephone Call Date of Discharge: 02/11/23 Discharge Facility: [facility]) Type of Discharge: Inpatient Admission Primary Inpatient Discharge Diagnosis:: S/P partial resection of colon How have you been since you were released from the hospital?: Better Any questions or concerns?: No  Items Reviewed: Did you receive and understand the discharge instructions provided?: Yes Medications obtained and verified?: Yes (Medications Reviewed) Any new allergies since your discharge?: No Dietary orders reviewed?: Yes Type of Diet Ordered:: Reg (discussed sm feedings) Do you have support at home?: Yes People in Home: parent(s) Name of Support/Comfort Primary Source: [facility] and Equipment/Supplies: Were Home Health Services Ordered?: No Any new equipment or medical supplies ordered?: No  Functional Questionnaire: Do you need assistance with bathing/showering or dressing?: Yes Do you need assistance with meal preparation?: Yes Do you need assistance with eating?: No Do you have difficulty maintaining continence: Yes Do you need assistance with getting out of bed/getting out of a chair/moving?: Yes Do you have difficulty managing or taking your medications?: No  Follow up appointments reviewed: PCP Follow-up appointment confirmed?: NA Specialist Hospital Follow-up appointment confirmed?: Yes Date of Specialist follow-up appointment?: 02/28/23 Follow-Up Specialty Provider:: Oswaldo Milian [id number] 1:45 Do you need transportation to your follow-up appointment?: No Do you understand care options if your condition(s) worsen?: Yes-patient verbalized understanding  SDOH Interventions Today    Flowsheet Row Most Recent Value  SDOH  Interventions   Food Insecurity Interventions Intervention Not Indicated  Housing Interventions Intervention Not Indicated  Transportation Interventions Intervention Not Indicated      Interventions Today    Flowsheet Row Most Recent Value  General Interventions   General Interventions Discussed/Reviewed General Interventions Discussed, General Interventions Reviewed, Doctor Visits  Doctor Visits Discussed/Reviewed Doctor Visits Discussed, Doctor Visits Reviewed  Nutrition Interventions   Nutrition Discussed/Reviewed Nutrition Discussed, Nutrition Reviewed, Fluid intake, Portion sizes  [frequent smal feedings.]      TOC Interventions Today    Flowsheet Row Most Recent Value  TOC Interventions   TOC Interventions Discussed/Reviewed TOC Interventions Discussed, TOC Interventions Reviewed        Gean Maidens BSN RN [facility] [phone]

## 2023-02-15 ENCOUNTER — Telehealth: Payer: Self-pay | Admitting: Surgery

## 2023-02-15 ENCOUNTER — Other Ambulatory Visit: Payer: Self-pay | Admitting: Physician Assistant

## 2023-02-15 MED ORDER — LOPERAMIDE HCL 2 MG PO TABS: 2.0000 mg | ORAL_TABLET | Freq: Three times a day (TID) | ORAL | 0 refills | Status: DC | PRN

## 2023-02-15 NOTE — Telephone Encounter (Signed)
Mom, Alec Campbell called for her son Alec Campbell. He had lap right colectomy done on 02/10/28 with  Dr. Everlene Farrier and last couple of days he has been having a lot of diarrhea and has vomited twice now.  There is no fever or chills, but does have nausea and vomiting.  Wants to know if there is anything he can take to help slow down the diarrhea.  They use [facility] pharmacy on [address] in [place]. Please call her. Thank you.

## 2023-02-15 NOTE — Telephone Encounter (Signed)
Spoke with Lynden Oxford, PA-C and he has sent in a prescription for Imodium. I spoke with the patient's mother and she says that she did give him his Zofran and he has had no further vomiting. She is concerned about the diarrhea. Instructed to limit food intake to bland things like toast, applesauce, and bananas for the next two day. She is pushing fluids and Pedialyte. She is aware to call back if he develops fever or chills or abdominal pain, or the diarrhea does not improve.

## 2023-02-16 ENCOUNTER — Encounter: Payer: Self-pay | Admitting: Surgery

## 2023-02-16 ENCOUNTER — Ambulatory Visit (INDEPENDENT_AMBULATORY_CARE_PROVIDER_SITE_OTHER): Payer: 59 | Admitting: Surgery

## 2023-02-16 VITALS — BP 133/83 | HR 85 | Temp 98.6°F | Ht 70.0 in | Wt 158.0 lb

## 2023-02-16 DIAGNOSIS — K8010 Calculus of gallbladder with chronic cholecystitis without obstruction: Secondary | ICD-10-CM

## 2023-02-16 DIAGNOSIS — E860 Dehydration: Secondary | ICD-10-CM

## 2023-02-16 DIAGNOSIS — Z09 Encounter for follow-up examination after completed treatment for conditions other than malignant neoplasm: Secondary | ICD-10-CM

## 2023-02-16 DIAGNOSIS — C189 Malignant neoplasm of colon, unspecified: Secondary | ICD-10-CM

## 2023-02-16 MED ORDER — LACTATED RINGERS IV BOLUS
2000.0000 mL | Freq: Once | INTRAVENOUS | Status: DC
Start: 2023-02-17 — End: 2023-02-21

## 2023-02-16 NOTE — Progress Notes (Signed)
Outpatient Surgical Follow Up  02/16/2023  Alec Campbell is an 52 y.o. male.   Chief Complaint  Patient presents with   Routine Post Op    HPI: s/p HALS right colectomy. Nausea and diarrhea. No pain . No fevers. Decrease po intake. Some hiccups. He has diarrhea as well. Path d/w family in detail. Negative margins and negative nodes.   Past Medical History:  Diagnosis Date   Allergy    Moderate mental retardation    Seizures    Vitamin D deficiency     Past Surgical History:  Procedure Laterality Date   ANKLE SURGERY Right    CHOLECYSTECTOMY N/A 02/10/2023   Procedure: LAPAROSCOPIC CHOLECYSTECTOMY;  Surgeon: Leafy Ro, MD;  Location: [facility] ORS;  Service: General;  Laterality: N/A;   CLEFT LIP REPAIR     COLONOSCOPY N/A 12/30/2022   Procedure: COLONOSCOPY;  Surgeon: Wyline Mood, MD;  Location: [facility] ENDOSCOPY;  Service: Gastroenterology;  Laterality: N/A;  SPECIAL NEEDS - MOTHER NEEDS TO ACCOMPANY HIM.   ESOPHAGOGASTRODUODENOSCOPY (EGD) WITH PROPOFOL N/A 12/30/2022   Procedure: ESOPHAGOGASTRODUODENOSCOPY (EGD) WITH PROPOFOL;  Surgeon: Wyline Mood, MD;  Location: [facility] ENDOSCOPY;  Service: Gastroenterology;  Laterality: N/A;   HERNIA REPAIR     Inguinal   LAPAROSCOPIC RIGHT COLECTOMY N/A 02/10/2023   Procedure: LAPAROSCOPIC RIGHT COLECTOMY, hand assisted, RNFA to assist;  Surgeon: Leafy Ro, MD;  Location: [facility] ORS;  Service: General;  Laterality: N/A;   PALATE SURGERY     TYMPANOSTOMY TUBE PLACEMENT      Family History  Problem Relation Age of Onset   Hypertension Mother    Thyroid disease Mother    CAD Father    Hypertension Father    Hyperlipidemia Father    Healthy Brother    Healthy Sister    Diabetes Brother    COPD Brother    Emphysema Brother    Alcohol abuse Brother    Hypertension Brother    Aneurysm Brother        brain    Social History:  reports that he has never smoked. He has never been exposed to tobacco smoke. He has never used smokeless  tobacco. He reports that he does not drink alcohol and does not use drugs.  Allergies: No Known Allergies  Medications reviewed.    ROS Full ROS performed and is otherwise negative other than what is stated in HPI   BP 133/83   Pulse 85   Temp 98.6 F (37 C)   Ht 5\' 10"  (1.778 m)   Wt 158 lb (71.7 kg)   SpO2 98%   BMI 22.67 kg/m   Physical Exam NAD alert Able to give me high five Not in pain Abd: soft, decrease bs, incisions c/d/I, no peritonitis   Assessment/Plan: Dehydration from ileus. D/W mother about zofran Sl and Pedialyte. I have place order for fluid bolus tomorrow if he continues to be dehydrated. Given his neurological delayed ding IV has been very problematic in the past, , he has bhas to be restrained and given ketamine. Hopfeuly we will be able to hydrate him orally. She understands that he may have to go to the ER for fluids as well. He is not toxic , nor toxic at this time. We  will f/u next week   Sterling Big, MD [facility] General Surgeon

## 2023-02-16 NOTE — Patient Instructions (Signed)
Go to [facility] entrance tomorrow to have the IV infusion done. Arrival time is  11:45 am.  Keep well hydrated. Do not use any more Imodium.   Follow up here next week.

## 2023-02-17 ENCOUNTER — Ambulatory Visit
Admission: RE | Admit: 2023-02-17 | Discharge: 2023-02-17 | Disposition: A | Discharge status: Home / Self Care | Payer: 59 | Source: Ambulatory Visit | Attending: Surgery | Admitting: Surgery

## 2023-02-17 DIAGNOSIS — J9811 Atelectasis: Secondary | ICD-10-CM | POA: Diagnosis not present

## 2023-02-17 DIAGNOSIS — E860 Dehydration: Secondary | ICD-10-CM | POA: Insufficient documentation

## 2023-02-17 DIAGNOSIS — K3189 Other diseases of stomach and duodenum: Secondary | ICD-10-CM | POA: Diagnosis not present

## 2023-02-17 DIAGNOSIS — N3289 Other specified disorders of bladder: Secondary | ICD-10-CM | POA: Diagnosis not present

## 2023-02-17 DIAGNOSIS — G40909 Epilepsy, unspecified, not intractable, without status epilepticus: Secondary | ICD-10-CM | POA: Diagnosis present

## 2023-02-17 DIAGNOSIS — E871 Hypo-osmolality and hyponatremia: Secondary | ICD-10-CM | POA: Diagnosis not present

## 2023-02-17 DIAGNOSIS — F71 Moderate intellectual disabilities: Secondary | ICD-10-CM | POA: Diagnosis present

## 2023-02-17 DIAGNOSIS — Z7189 Other specified counseling: Secondary | ICD-10-CM | POA: Diagnosis not present

## 2023-02-17 DIAGNOSIS — R0902 Hypoxemia: Secondary | ICD-10-CM | POA: Diagnosis not present

## 2023-02-17 DIAGNOSIS — A419 Sepsis, unspecified organism: Secondary | ICD-10-CM | POA: Diagnosis not present

## 2023-02-17 DIAGNOSIS — Z4659 Encounter for fitting and adjustment of other gastrointestinal appliance and device: Secondary | ICD-10-CM | POA: Diagnosis not present

## 2023-02-17 DIAGNOSIS — J690 Pneumonitis due to inhalation of food and vomit: Secondary | ICD-10-CM | POA: Diagnosis not present

## 2023-02-17 DIAGNOSIS — R918 Other nonspecific abnormal finding of lung field: Secondary | ICD-10-CM | POA: Diagnosis not present

## 2023-02-17 DIAGNOSIS — J189 Pneumonia, unspecified organism: Secondary | ICD-10-CM | POA: Diagnosis present

## 2023-02-17 DIAGNOSIS — R6521 Severe sepsis with septic shock: Secondary | ICD-10-CM | POA: Diagnosis not present

## 2023-02-17 DIAGNOSIS — K5989 Other specified functional intestinal disorders: Secondary | ICD-10-CM | POA: Diagnosis not present

## 2023-02-17 DIAGNOSIS — Y838 Other surgical procedures as the cause of abnormal reaction of the patient, or of later complication, without mention of misadventure at the time of the procedure: Secondary | ICD-10-CM | POA: Diagnosis present

## 2023-02-17 DIAGNOSIS — Y95 Nosocomial condition: Secondary | ICD-10-CM | POA: Diagnosis present

## 2023-02-17 DIAGNOSIS — K1370 Unspecified lesions of oral mucosa: Secondary | ICD-10-CM | POA: Diagnosis not present

## 2023-02-17 DIAGNOSIS — B002 Herpesviral gingivostomatitis and pharyngotonsillitis: Secondary | ICD-10-CM | POA: Diagnosis not present

## 2023-02-17 DIAGNOSIS — K567 Ileus, unspecified: Secondary | ICD-10-CM | POA: Diagnosis not present

## 2023-02-17 DIAGNOSIS — J181 Lobar pneumonia, unspecified organism: Secondary | ICD-10-CM | POA: Diagnosis not present

## 2023-02-17 DIAGNOSIS — R1111 Vomiting without nausea: Secondary | ICD-10-CM | POA: Diagnosis not present

## 2023-02-17 DIAGNOSIS — E785 Hyperlipidemia, unspecified: Secondary | ICD-10-CM | POA: Diagnosis not present

## 2023-02-17 DIAGNOSIS — N3000 Acute cystitis without hematuria: Secondary | ICD-10-CM | POA: Diagnosis not present

## 2023-02-17 DIAGNOSIS — K9189 Other postprocedural complications and disorders of digestive system: Secondary | ICD-10-CM | POA: Diagnosis not present

## 2023-02-17 DIAGNOSIS — F419 Anxiety disorder, unspecified: Secondary | ICD-10-CM | POA: Diagnosis present

## 2023-02-17 DIAGNOSIS — Z1152 Encounter for screening for COVID-19: Secondary | ICD-10-CM | POA: Diagnosis not present

## 2023-02-17 DIAGNOSIS — D72829 Elevated white blood cell count, unspecified: Secondary | ICD-10-CM | POA: Diagnosis not present

## 2023-02-17 DIAGNOSIS — R197 Diarrhea, unspecified: Secondary | ICD-10-CM | POA: Diagnosis not present

## 2023-02-17 DIAGNOSIS — R066 Hiccough: Secondary | ICD-10-CM | POA: Diagnosis not present

## 2023-02-17 DIAGNOSIS — E876 Hypokalemia: Secondary | ICD-10-CM | POA: Diagnosis not present

## 2023-02-17 DIAGNOSIS — K4090 Unilateral inguinal hernia, without obstruction or gangrene, not specified as recurrent: Secondary | ICD-10-CM | POA: Diagnosis not present

## 2023-02-17 DIAGNOSIS — R140 Abdominal distension (gaseous): Secondary | ICD-10-CM | POA: Diagnosis not present

## 2023-02-17 DIAGNOSIS — I700 Atherosclerosis of aorta: Secondary | ICD-10-CM | POA: Diagnosis not present

## 2023-02-17 DIAGNOSIS — R7402 Elevation of levels of lactic acid dehydrogenase (LDH): Secondary | ICD-10-CM | POA: Diagnosis not present

## 2023-02-17 DIAGNOSIS — N179 Acute kidney failure, unspecified: Secondary | ICD-10-CM | POA: Diagnosis not present

## 2023-02-17 DIAGNOSIS — R509 Fever, unspecified: Secondary | ICD-10-CM | POA: Diagnosis not present

## 2023-02-17 DIAGNOSIS — R109 Unspecified abdominal pain: Secondary | ICD-10-CM | POA: Diagnosis not present

## 2023-02-17 DIAGNOSIS — I499 Cardiac arrhythmia, unspecified: Secondary | ICD-10-CM | POA: Diagnosis not present

## 2023-02-17 DIAGNOSIS — K56609 Unspecified intestinal obstruction, unspecified as to partial versus complete obstruction: Secondary | ICD-10-CM | POA: Diagnosis not present

## 2023-02-17 DIAGNOSIS — J9601 Acute respiratory failure with hypoxia: Secondary | ICD-10-CM | POA: Diagnosis not present

## 2023-02-17 DIAGNOSIS — M6282 Rhabdomyolysis: Secondary | ICD-10-CM | POA: Diagnosis not present

## 2023-02-17 DIAGNOSIS — I959 Hypotension, unspecified: Secondary | ICD-10-CM | POA: Diagnosis not present

## 2023-02-17 DIAGNOSIS — A4189 Other specified sepsis: Secondary | ICD-10-CM | POA: Diagnosis not present

## 2023-02-17 DIAGNOSIS — R6520 Severe sepsis without septic shock: Secondary | ICD-10-CM | POA: Diagnosis not present

## 2023-02-17 MED ORDER — LACTATED RINGERS IV SOLN: INTRAVENOUS | Status: DC

## 2023-02-19 ENCOUNTER — Inpatient Hospital Stay
Admission: EM | Admit: 2023-02-19 | Discharge: 2023-03-03 | DRG: 871 | Disposition: A | Discharge status: Home Health | Payer: 59 | Attending: Nurse Practitioner | Admitting: Nurse Practitioner

## 2023-02-19 ENCOUNTER — Other Ambulatory Visit: Payer: Self-pay

## 2023-02-19 ENCOUNTER — Inpatient Hospital Stay: Payer: 59

## 2023-02-19 ENCOUNTER — Emergency Department: Payer: 59

## 2023-02-19 DIAGNOSIS — E785 Hyperlipidemia, unspecified: Secondary | ICD-10-CM | POA: Diagnosis present

## 2023-02-19 DIAGNOSIS — G40909 Epilepsy, unspecified, not intractable, without status epilepticus: Secondary | ICD-10-CM | POA: Diagnosis present

## 2023-02-19 DIAGNOSIS — I700 Atherosclerosis of aorta: Secondary | ICD-10-CM | POA: Diagnosis present

## 2023-02-19 DIAGNOSIS — K56609 Unspecified intestinal obstruction, unspecified as to partial versus complete obstruction: Secondary | ICD-10-CM | POA: Diagnosis not present

## 2023-02-19 DIAGNOSIS — D72829 Elevated white blood cell count, unspecified: Secondary | ICD-10-CM | POA: Diagnosis not present

## 2023-02-19 DIAGNOSIS — F71 Moderate intellectual disabilities: Secondary | ICD-10-CM | POA: Diagnosis present

## 2023-02-19 DIAGNOSIS — R109 Unspecified abdominal pain: Secondary | ICD-10-CM | POA: Diagnosis not present

## 2023-02-19 DIAGNOSIS — E871 Hypo-osmolality and hyponatremia: Secondary | ICD-10-CM | POA: Diagnosis present

## 2023-02-19 DIAGNOSIS — E876 Hypokalemia: Secondary | ICD-10-CM | POA: Diagnosis present

## 2023-02-19 DIAGNOSIS — J690 Pneumonitis due to inhalation of food and vomit: Secondary | ICD-10-CM | POA: Diagnosis present

## 2023-02-19 DIAGNOSIS — F79 Unspecified intellectual disabilities: Secondary | ICD-10-CM

## 2023-02-19 DIAGNOSIS — R066 Hiccough: Secondary | ICD-10-CM | POA: Diagnosis present

## 2023-02-19 DIAGNOSIS — R7402 Elevation of levels of lactic acid dehydrogenase (LDH): Secondary | ICD-10-CM | POA: Diagnosis not present

## 2023-02-19 DIAGNOSIS — Z9049 Acquired absence of other specified parts of digestive tract: Secondary | ICD-10-CM

## 2023-02-19 DIAGNOSIS — J9601 Acute respiratory failure with hypoxia: Secondary | ICD-10-CM | POA: Diagnosis present

## 2023-02-19 DIAGNOSIS — K6389 Other specified diseases of intestine: Secondary | ICD-10-CM | POA: Diagnosis present

## 2023-02-19 DIAGNOSIS — Y838 Other surgical procedures as the cause of abnormal reaction of the patient, or of later complication, without mention of misadventure at the time of the procedure: Secondary | ICD-10-CM | POA: Diagnosis present

## 2023-02-19 DIAGNOSIS — F419 Anxiety disorder, unspecified: Secondary | ICD-10-CM | POA: Diagnosis present

## 2023-02-19 DIAGNOSIS — B002 Herpesviral gingivostomatitis and pharyngotonsillitis: Secondary | ICD-10-CM | POA: Diagnosis not present

## 2023-02-19 DIAGNOSIS — M6282 Rhabdomyolysis: Secondary | ICD-10-CM | POA: Diagnosis not present

## 2023-02-19 DIAGNOSIS — Z1152 Encounter for screening for COVID-19: Secondary | ICD-10-CM | POA: Diagnosis not present

## 2023-02-19 DIAGNOSIS — R451 Restlessness and agitation: Secondary | ICD-10-CM | POA: Diagnosis not present

## 2023-02-19 DIAGNOSIS — I499 Cardiac arrhythmia, unspecified: Secondary | ICD-10-CM | POA: Diagnosis not present

## 2023-02-19 DIAGNOSIS — K567 Ileus, unspecified: Secondary | ICD-10-CM | POA: Diagnosis present

## 2023-02-19 DIAGNOSIS — K9189 Other postprocedural complications and disorders of digestive system: Secondary | ICD-10-CM | POA: Diagnosis present

## 2023-02-19 DIAGNOSIS — R112 Nausea with vomiting, unspecified: Secondary | ICD-10-CM

## 2023-02-19 DIAGNOSIS — J189 Pneumonia, unspecified organism: Secondary | ICD-10-CM | POA: Diagnosis present

## 2023-02-19 DIAGNOSIS — R0902 Hypoxemia: Secondary | ICD-10-CM | POA: Diagnosis not present

## 2023-02-19 DIAGNOSIS — Z4659 Encounter for fitting and adjustment of other gastrointestinal appliance and device: Secondary | ICD-10-CM | POA: Diagnosis not present

## 2023-02-19 DIAGNOSIS — N3000 Acute cystitis without hematuria: Secondary | ICD-10-CM | POA: Diagnosis present

## 2023-02-19 DIAGNOSIS — N320 Bladder-neck obstruction: Secondary | ICD-10-CM | POA: Diagnosis present

## 2023-02-19 DIAGNOSIS — K5989 Other specified functional intestinal disorders: Secondary | ICD-10-CM | POA: Diagnosis not present

## 2023-02-19 DIAGNOSIS — N179 Acute kidney failure, unspecified: Secondary | ICD-10-CM | POA: Diagnosis present

## 2023-02-19 DIAGNOSIS — K4090 Unilateral inguinal hernia, without obstruction or gangrene, not specified as recurrent: Secondary | ICD-10-CM | POA: Diagnosis not present

## 2023-02-19 DIAGNOSIS — K3189 Other diseases of stomach and duodenum: Secondary | ICD-10-CM | POA: Diagnosis not present

## 2023-02-19 DIAGNOSIS — A419 Sepsis, unspecified organism: Secondary | ICD-10-CM | POA: Diagnosis present

## 2023-02-19 DIAGNOSIS — Z810 Family history of intellectual disabilities: Secondary | ICD-10-CM

## 2023-02-19 DIAGNOSIS — A4189 Other specified sepsis: Secondary | ICD-10-CM | POA: Diagnosis not present

## 2023-02-19 DIAGNOSIS — R140 Abdominal distension (gaseous): Secondary | ICD-10-CM | POA: Diagnosis not present

## 2023-02-19 DIAGNOSIS — R6521 Severe sepsis with septic shock: Secondary | ICD-10-CM | POA: Diagnosis present

## 2023-02-19 DIAGNOSIS — Z7189 Other specified counseling: Secondary | ICD-10-CM | POA: Diagnosis not present

## 2023-02-19 DIAGNOSIS — J181 Lobar pneumonia, unspecified organism: Secondary | ICD-10-CM | POA: Diagnosis not present

## 2023-02-19 DIAGNOSIS — R918 Other nonspecific abnormal finding of lung field: Secondary | ICD-10-CM | POA: Diagnosis not present

## 2023-02-19 DIAGNOSIS — E860 Dehydration: Secondary | ICD-10-CM | POA: Diagnosis present

## 2023-02-19 DIAGNOSIS — R197 Diarrhea, unspecified: Secondary | ICD-10-CM | POA: Diagnosis not present

## 2023-02-19 DIAGNOSIS — R6520 Severe sepsis without septic shock: Secondary | ICD-10-CM | POA: Diagnosis not present

## 2023-02-19 DIAGNOSIS — N3289 Other specified disorders of bladder: Secondary | ICD-10-CM | POA: Diagnosis not present

## 2023-02-19 DIAGNOSIS — K1370 Unspecified lesions of oral mucosa: Secondary | ICD-10-CM | POA: Diagnosis not present

## 2023-02-19 DIAGNOSIS — Y95 Nosocomial condition: Secondary | ICD-10-CM | POA: Diagnosis present

## 2023-02-19 DIAGNOSIS — K4020 Bilateral inguinal hernia, without obstruction or gangrene, not specified as recurrent: Secondary | ICD-10-CM | POA: Diagnosis present

## 2023-02-19 DIAGNOSIS — Z8249 Family history of ischemic heart disease and other diseases of the circulatory system: Secondary | ICD-10-CM

## 2023-02-19 DIAGNOSIS — R509 Fever, unspecified: Secondary | ICD-10-CM | POA: Diagnosis not present

## 2023-02-19 DIAGNOSIS — I959 Hypotension, unspecified: Principal | ICD-10-CM

## 2023-02-19 DIAGNOSIS — Z79899 Other long term (current) drug therapy: Secondary | ICD-10-CM

## 2023-02-19 DIAGNOSIS — J9811 Atelectasis: Secondary | ICD-10-CM | POA: Diagnosis not present

## 2023-02-19 DIAGNOSIS — R1111 Vomiting without nausea: Secondary | ICD-10-CM | POA: Diagnosis not present

## 2023-02-19 LAB — TROPONIN I (HIGH SENSITIVITY): Troponin I (High Sensitivity): 48 ng/L — ABNORMAL HIGH (ref <18)

## 2023-02-19 LAB — CBC WITH DIFFERENTIAL/PLATELET
Abs Immature Granulocytes: 0.03 10*3/uL (ref 0.00–0.07)
Basophils Absolute: 0 10*3/uL (ref 0.0–0.1)
Basophils Relative: 0 %
Eosinophils Absolute: 0 10*3/uL (ref 0.0–0.5)
Eosinophils Relative: 0 %
HCT: 39.6 % (ref 39.0–52.0)
Hemoglobin: 13.8 g/dL (ref 13.0–17.0)
Immature Granulocytes: 0 %
Lymphocytes Relative: 16 %
Lymphs Abs: 1.3 10*3/uL (ref 0.7–4.0)
MCH: 29.2 pg (ref 26.0–34.0)
MCHC: 34.8 g/dL (ref 30.0–36.0)
MCV: 83.9 fL (ref 80.0–100.0)
Monocytes Absolute: 0.1 10*3/uL (ref 0.1–1.0)
Monocytes Relative: 2 %
Neutro Abs: 6.8 10*3/uL (ref 1.7–7.7)
Neutrophils Relative %: 82 %
Platelets: 381 10*3/uL (ref 150–400)
RBC: 4.72 MIL/uL (ref 4.22–5.81)
RDW: 12.3 % (ref 11.5–15.5)
WBC: 8.4 10*3/uL (ref 4.0–10.5)
nRBC: 0 % (ref 0.0–0.2)

## 2023-02-19 LAB — CULTURE, BLOOD (ROUTINE X 2)
Culture: NO GROWTH
Culture: NO GROWTH

## 2023-02-19 LAB — URINALYSIS, COMPLETE (UACMP) WITH MICROSCOPIC
Bacteria, UA: NONE SEEN
Bilirubin Urine: NEGATIVE
Glucose, UA: NEGATIVE mg/dL
Hgb urine dipstick: NEGATIVE
Ketones, ur: NEGATIVE mg/dL
Leukocytes,Ua: NEGATIVE
Nitrite: NEGATIVE
Protein, ur: 100 mg/dL — AB
Specific Gravity, Urine: 1.045 — ABNORMAL HIGH (ref 1.005–1.030)
pH: 5 (ref 5.0–8.0)

## 2023-02-19 LAB — COMPREHENSIVE METABOLIC PANEL
ALT: 21 U/L (ref 0–44)
AST: 33 U/L (ref 15–41)
Albumin: 3.1 g/dL — ABNORMAL LOW (ref 3.5–5.0)
Alkaline Phosphatase: 110 U/L (ref 38–126)
Anion gap: 15 (ref 5–15)
BUN: 36 mg/dL — ABNORMAL HIGH (ref 6–20)
CO2: 24 mmol/L (ref 22–32)
Calcium: 8.2 mg/dL — ABNORMAL LOW (ref 8.9–10.3)
Chloride: 86 mmol/L — ABNORMAL LOW (ref 98–111)
Creatinine, Ser: 1.45 mg/dL — ABNORMAL HIGH (ref 0.61–1.24)
GFR, Estimated: 58 mL/min — ABNORMAL LOW (ref >60)
Glucose, Bld: 206 mg/dL — ABNORMAL HIGH (ref 70–99)
Potassium: 3.1 mmol/L — ABNORMAL LOW (ref 3.5–5.1)
Sodium: 125 mmol/L — ABNORMAL LOW (ref 135–145)
Total Bilirubin: 1 mg/dL (ref 0.3–1.2)
Total Protein: 6.3 g/dL — ABNORMAL LOW (ref 6.5–8.1)

## 2023-02-19 LAB — RESP PANEL BY RT-PCR (RSV, FLU A&B, COVID)  RVPGX2
Influenza A by PCR: NEGATIVE
Influenza B by PCR: NEGATIVE
Resp Syncytial Virus by PCR: NEGATIVE
SARS Coronavirus 2 by RT PCR: NEGATIVE

## 2023-02-19 LAB — LACTIC ACID, PLASMA
Lactic Acid, Venous: 4.6 mmol/L (ref 0.5–1.9)
Lactic Acid, Venous: 2.9 mmol/L (ref 0.5–1.9)

## 2023-02-19 LAB — CARBAMAZEPINE LEVEL, TOTAL: Carbamazepine Lvl: 6.3 ug/mL (ref 4.0–12.0)

## 2023-02-19 LAB — MRSA NEXT GEN BY PCR, NASAL: MRSA by PCR Next Gen: NOT DETECTED

## 2023-02-19 LAB — PROTIME-INR
INR: 1.5 — ABNORMAL HIGH (ref 0.8–1.2)
Prothrombin Time: 17.5 seconds — ABNORMAL HIGH (ref 11.4–15.2)

## 2023-02-19 LAB — PHENOBARBITAL LEVEL: Phenobarbital: 22.6 ug/mL (ref 15.0–40.0)

## 2023-02-19 LAB — STREP PNEUMONIAE URINARY ANTIGEN: Strep Pneumo Urinary Antigen: NEGATIVE

## 2023-02-19 LAB — MAGNESIUM: Magnesium: 1.7 mg/dL (ref 1.7–2.4)

## 2023-02-19 LAB — LIPASE, BLOOD: Lipase: 49 U/L (ref 11–51)

## 2023-02-19 LAB — APTT: aPTT: 23 seconds — ABNORMAL LOW (ref 24–36)

## 2023-02-19 LAB — PROCALCITONIN: Procalcitonin: 0.22 ng/mL

## 2023-02-19 MED ORDER — NOREPINEPHRINE 4 MG/250ML-% IV SOLN: 0.0000 ug/min | INTRAVENOUS | Status: DC

## 2023-02-19 MED ORDER — KETAMINE HCL 50 MG/5ML IJ SOSY
0.3000 mg/kg | PREFILLED_SYRINGE | Freq: Once | INTRAMUSCULAR | Status: AC
  Administered 2023-02-19: 22 mg via INTRAVENOUS
  Filled 2023-02-19: qty 5

## 2023-02-19 MED ORDER — BUDESONIDE 0.25 MG/2ML IN SUSP
0.2500 mg | Freq: Two times a day (BID) | RESPIRATORY_TRACT | Status: DC
  Administered 2023-02-19: 0.25 mg via RESPIRATORY_TRACT
  Filled 2023-02-19: qty 2

## 2023-02-19 MED ORDER — NOREPINEPHRINE 4 MG/250ML-% IV SOLN
2.0000 ug/min | INTRAVENOUS | Status: DC
  Filled 2023-02-19: qty 250

## 2023-02-19 MED ORDER — ORAL CARE MOUTH RINSE: 15.0000 mL | OROMUCOSAL | Status: DC | PRN

## 2023-02-19 MED ORDER — IPRATROPIUM-ALBUTEROL 0.5-2.5 (3) MG/3ML IN SOLN: 3.0000 mL | Freq: Four times a day (QID) | RESPIRATORY_TRACT | Status: DC | PRN

## 2023-02-19 MED ORDER — SODIUM CHLORIDE 0.9 % IV SOLN
750.0000 mg | Freq: Two times a day (BID) | INTRAVENOUS | Status: DC
  Administered 2023-02-19 – 2023-02-22 (×6): 750 mg via INTRAVENOUS
  Filled 2023-02-19 (×7): qty 7.5

## 2023-02-19 MED ORDER — ONDANSETRON HCL 4 MG/2ML IJ SOLN
4.0000 mg | Freq: Once | INTRAMUSCULAR | Status: AC
  Administered 2023-02-19: 4 mg via INTRAVENOUS
  Filled 2023-02-19: qty 2

## 2023-02-19 MED ORDER — IPRATROPIUM-ALBUTEROL 0.5-2.5 (3) MG/3ML IN SOLN
3.0000 mL | Freq: Four times a day (QID) | RESPIRATORY_TRACT | Status: DC
  Administered 2023-02-19 (×3): 3 mL via RESPIRATORY_TRACT
  Filled 2023-02-19 (×3): qty 3

## 2023-02-19 MED ORDER — METRONIDAZOLE 500 MG/100ML IV SOLN
500.0000 mg | Freq: Once | INTRAVENOUS | Status: AC
  Administered 2023-02-19: 500 mg via INTRAVENOUS
  Filled 2023-02-19: qty 100

## 2023-02-19 MED ORDER — ACETAMINOPHEN 500 MG PO TABS
1000.0000 mg | ORAL_TABLET | Freq: Once | ORAL | Status: AC
  Administered 2023-02-19: 1000 mg via ORAL
  Filled 2023-02-19: qty 2

## 2023-02-19 MED ORDER — VANCOMYCIN HCL 1750 MG/350ML IV SOLN
1750.0000 mg | Freq: Once | INTRAVENOUS | Status: AC
  Administered 2023-02-19: 1750 mg via INTRAVENOUS
  Filled 2023-02-19: qty 350

## 2023-02-19 MED ORDER — PIPERACILLIN-TAZOBACTAM 3.375 G IVPB
3.3750 g | Freq: Three times a day (TID) | INTRAVENOUS | Status: DC
  Administered 2023-02-19 – 2023-03-01 (×31): 3.375 g via INTRAVENOUS
  Filled 2023-02-19 (×31): qty 50

## 2023-02-19 MED ORDER — SODIUM CHLORIDE 0.9 % IV SOLN
250.0000 mL | INTRAVENOUS | Status: DC
  Administered 2023-02-19 – 2023-02-27 (×2): 250 mL via INTRAVENOUS

## 2023-02-19 MED ORDER — IOHEXOL 300 MG/ML  SOLN
100.0000 mL | Freq: Once | INTRAMUSCULAR | Status: AC | PRN
  Administered 2023-02-19: 100 mL via INTRAVENOUS

## 2023-02-19 MED ORDER — LACTATED RINGERS IV BOLUS
750.0000 mL | Freq: Once | INTRAVENOUS | Status: AC
  Administered 2023-02-19: 750 mL via INTRAVENOUS

## 2023-02-19 MED ORDER — SODIUM CHLORIDE 0.9 % IV SOLN
2.0000 g | Freq: Once | INTRAVENOUS | Status: AC
  Administered 2023-02-19: 2 g via INTRAVENOUS
  Filled 2023-02-19: qty 12.5

## 2023-02-19 MED ORDER — POTASSIUM CHLORIDE 10 MEQ/100ML IV SOLN
10.0000 meq | INTRAVENOUS | Status: AC
  Administered 2023-02-19 (×2): 10 meq via INTRAVENOUS
  Filled 2023-02-19 (×2): qty 100

## 2023-02-19 MED ORDER — IPRATROPIUM-ALBUTEROL 0.5-2.5 (3) MG/3ML IN SOLN: 3.0000 mL | Freq: Two times a day (BID) | RESPIRATORY_TRACT | Status: DC

## 2023-02-19 MED ORDER — HEPARIN SODIUM (PORCINE) 5000 UNIT/ML IJ SOLN
5000.0000 [IU] | Freq: Three times a day (TID) | INTRAMUSCULAR | Status: DC
  Administered 2023-02-19 – 2023-02-22 (×10): 5000 [IU] via SUBCUTANEOUS
  Filled 2023-02-19 (×10): qty 1

## 2023-02-19 MED ORDER — VANCOMYCIN HCL IN DEXTROSE 1-5 GM/200ML-% IV SOLN: 1000.0000 mg | Freq: Once | INTRAVENOUS | Status: DC

## 2023-02-19 MED ORDER — LACTATED RINGERS IV BOLUS (SEPSIS)
1000.0000 mL | Freq: Once | INTRAVENOUS | Status: AC
  Administered 2023-02-19: 1000 mL via INTRAVENOUS

## 2023-02-19 MED ORDER — MORPHINE SULFATE (PF) 2 MG/ML IV SOLN
2.0000 mg | INTRAVENOUS | Status: DC | PRN
  Administered 2023-02-19 – 2023-02-28 (×5): 2 mg via INTRAVENOUS
  Filled 2023-02-19 (×5): qty 1

## 2023-02-19 MED ORDER — DOCUSATE SODIUM 100 MG PO CAPS: 100.0000 mg | ORAL_CAPSULE | Freq: Two times a day (BID) | ORAL | Status: DC | PRN

## 2023-02-19 MED ORDER — FENTANYL CITRATE PF 50 MCG/ML IJ SOSY
50.0000 ug | PREFILLED_SYRINGE | Freq: Once | INTRAMUSCULAR | Status: AC
  Administered 2023-02-19: 50 ug via INTRAVENOUS
  Filled 2023-02-19: qty 1

## 2023-02-19 MED ORDER — POTASSIUM CHLORIDE 10 MEQ/100ML IV SOLN
10.0000 meq | Freq: Once | INTRAVENOUS | Status: AC
  Administered 2023-02-19: 10 meq via INTRAVENOUS
  Filled 2023-02-19: qty 100

## 2023-02-19 MED ORDER — LACTATED RINGERS IV SOLN: INTRAVENOUS | Status: AC

## 2023-02-19 MED ORDER — POLYETHYLENE GLYCOL 3350 17 G PO PACK: 17.0000 g | PACK | Freq: Every day | ORAL | Status: DC | PRN

## 2023-02-19 MED ORDER — METHYLPREDNISOLONE SODIUM SUCC 40 MG IJ SOLR
40.0000 mg | Freq: Once | INTRAMUSCULAR | Status: AC
  Administered 2023-02-19: 40 mg via INTRAVENOUS
  Filled 2023-02-19: qty 1

## 2023-02-19 MED ORDER — CHLORHEXIDINE GLUCONATE CLOTH 2 % EX PADS
6.0000 | MEDICATED_PAD | Freq: Every day | CUTANEOUS | Status: DC
  Administered 2023-02-19 – 2023-02-20 (×2): 6 via TOPICAL

## 2023-02-19 MED ORDER — LACTATED RINGERS IV BOLUS (SEPSIS)
250.0000 mL | Freq: Once | INTRAVENOUS | Status: AC
  Administered 2023-02-19: 250 mL via INTRAVENOUS

## 2023-02-19 MED ORDER — MAGNESIUM SULFATE 2 GM/50ML IV SOLN
2.0000 g | Freq: Once | INTRAVENOUS | Status: AC
  Administered 2023-02-19: 2 g via INTRAVENOUS
  Filled 2023-02-19: qty 50

## 2023-02-19 NOTE — Progress Notes (Signed)
PHARMACY CONSULT NOTE  Pharmacy Consult for Electrolyte Monitoring and Replacement   Recent Labs: Potassium (mmol/L)  Date Value  02/19/2023 3.1 (L)   Magnesium (mg/dL)  Date Value  16/08/9603 1.7   Calcium (mg/dL)  Date Value  54/07/8118 8.2 (L)   Albumin (g/dL)  Date Value  14/78/2956 3.1 (L)  07/27/2016 4.7   Sodium (mmol/L)  Date Value  02/19/2023 125 (L)  07/27/2016 133 (L)    Assessment: 52 y.o. male w/ PMH of seizure disorder, IDA, HLD, bilateral Inguinal hernia, ascending colonic mass with tubular adenoma with high grade dysplasia, recent right hemicolectomy admitted on 02/19/2023 with sepsis. Pharmacy is asked to follow and replace electrolytes while in CCU.  MIVF: lactated ringers at 150 mL/hr  Goal of Therapy:  Electrolytes WNL  Plan:  ---10 mEq IC KCl x 1 per NP (repeat x 2) ---2 grams IV magnesium sulfate x 1 ---recheck electrolytes in am  Lowella Bandy ,PharmD Clinical Pharmacist 02/19/2023 11:24 AM

## 2023-02-19 NOTE — Progress Notes (Signed)
Pharmacy Antibiotic Note  Alec Campbell is a 52 y.o. male w/ PMH of seizure disorder, IDA, HLD, bilateral Inguinal hernia, ascending colonic mass with tubular adenoma with high grade dysplasia, recent right hemicolectomy admitted on 02/19/2023 with sepsis.  Pharmacy has been consulted for Zosyn dosing.  Plan: start Zosyn 3.375g IV q8h (4 hour infusion). ---follow renal function for needed dose adjustments  Height:  (177.8 cm) Weight: 76.7 kg (169 lb 1.5 oz) IBW/kg (Calculated) : 73  Temp (24hrs), Avg:101 F (38.3 C), Min:99.8 F (37.7 C), Max:103.2 F (39.6 C)  Recent Labs  Lab 02/19/23 0210  WBC 8.4  CREATININE 1.45*  LATICACIDVEN 4.6*    Estimated Creatinine Clearance: 61.5 mL/min (A) (by C-G formula based on SCr of 1.45 mg/dL (H)).    No Known Allergies  Antimicrobials this admission: 04/20 Zosyn >>   Microbiology results: 04/20 BCx: pending 04/20 UCx: pending   Thank you for allowing pharmacy to be a part of this patient's care.  Lowella Bandy 02/19/2023 6:56 AM

## 2023-02-19 NOTE — Progress Notes (Signed)
RN unable to give report at this time, Awaiting call back, number given to Diplomatic Services operational officer.

## 2023-02-19 NOTE — Progress Notes (Signed)
   02/19/23 0900  Spiritual Encounters  Type of Visit Initial  Care provided to: Pt and family  Referral source Other (comment) (Chaplain rounding)  Reason for visit Routine spiritual support  OnCall Visit No  Spiritual Framework  Presenting Themes Courage hope and growth  Community/Connection Family  Interventions  Spiritual Care Interventions Made Established relationship of care and support;Compassionate presence;Reflective listening;Encouragement  Intervention Outcomes  Outcomes Connection to spiritual care  Spiritual Care Plan  Spiritual Care Issues Still Outstanding No further spiritual care needs at this time (see row info)  Advance Directives (For Healthcare)  Does Patient Have a Medical Advance Directive? No  Would patient like information on creating a medical advance directive? No - Guardian declined;No - Patient declined  Mental Health Advance Directives  Does Patient Have a Mental Health Advance Directive? No  Would patient like information on creating a mental health advance directive? No - Guardian declined   Chaplain rounding and stopped for routine spiritual support. 2 family members at bedside, pleasant.  Chaplain provided compassionate presence, empathetic listening and beverage for  family member. No further intervention at this time. Chaplain available for follow up as needed.

## 2023-02-19 NOTE — Progress Notes (Signed)
CODE SEPSIS - PHARMACY COMMUNICATION  **Broad Spectrum Antibiotics should be administered within 1 hour of Sepsis diagnosis**  Time Code Sepsis Called/Page Received: 0217  Antibiotics Ordered: Cefepime, Flagyl, Vancomycin  Time of 1st antibiotic administration: 0218  Otelia Sergeant, PharmD, MBA 02/19/2023 2:20 AM

## 2023-02-19 NOTE — ED Notes (Signed)
Advised nurse that patient has ready bed 

## 2023-02-19 NOTE — ED Provider Notes (Addendum)
[facility] Provider Note    Event Date/Time   First MD Initiated Contact with Patient 02/19/23 [phone]     (approximate)   History   Fever and Abdominal Pain (Had portion of intestine and gallbladder removed last week via Larapacopic surgery. Been having vomiting and hiccups nonstop since. Tachycardic in the 130s, febrile. Poor appetite)   HPI  Alec Campbell is a 52 y.o. male with history of mental retardation, development disorder, seizures on Tegretol and phenobarbital who presents to the emergency department with EMS for concerns for sepsis.  Patient underwent right-sided colectomy for an ascending colon mass and cholecystectomy for cholelithiasis and chronic cholecystitis by Dr. Everlene Farrier on 02/10/2023.  Was discharged the next day doing well.  Started having nausea, vomiting and diarrhea on Saturday, April 13.  Was seen by Dr. Everlene Farrier in the office on 02/16/2023 and 2 L of fluids were ordered for 02/17/2023.  Started having fever tonight and had 2 syncopal episodes were mother states lost consciousness briefly with possible mild myoclonic jerks.  She is not sure if these are seizures or not but states he came back too quickly.  He is febrile and hypotensive with EMS.  Mother denies any cough.  He has had decreased oral intake.   History provided by EMS, patient's mother, level 5 caveat due to intellectual disability.    Past Medical History:  Diagnosis Date   Allergy    Moderate mental retardation    Seizures    Vitamin D deficiency     Past Surgical History:  Procedure Laterality Date   ANKLE SURGERY Right    CHOLECYSTECTOMY N/A 02/10/2023   Procedure: LAPAROSCOPIC CHOLECYSTECTOMY;  Surgeon: Leafy Ro, MD;  Location: [facility] ORS;  Service: General;  Laterality: N/A;   CLEFT LIP REPAIR     COLONOSCOPY N/A 12/30/2022   Procedure: COLONOSCOPY;  Surgeon: Wyline Mood, MD;  Location: [facility] ENDOSCOPY;  Service: Gastroenterology;  Laterality: N/A;  SPECIAL NEEDS -  MOTHER NEEDS TO ACCOMPANY HIM.   ESOPHAGOGASTRODUODENOSCOPY (EGD) WITH PROPOFOL N/A 12/30/2022   Procedure: ESOPHAGOGASTRODUODENOSCOPY (EGD) WITH PROPOFOL;  Surgeon: Wyline Mood, MD;  Location: [facility] ENDOSCOPY;  Service: Gastroenterology;  Laterality: N/A;   HERNIA REPAIR     Inguinal   LAPAROSCOPIC RIGHT COLECTOMY N/A 02/10/2023   Procedure: LAPAROSCOPIC RIGHT COLECTOMY, hand assisted, RNFA to assist;  Surgeon: Leafy Ro, MD;  Location: [facility] ORS;  Service: General;  Laterality: N/A;   PALATE SURGERY     TYMPANOSTOMY TUBE PLACEMENT      MEDICATIONS:  Prior to Admission medications   Medication Sig Start Date End Date Taking? Authorizing Provider  carbamazepine (TEGRETOL) 200 MG tablet TAKE 2 TABLETS BY MOUTH IN THE MORNING, THEN TAKE  1 TABLET AT LUNCH AND TAKE 1 AT NIGHT 12/10/22   Alba Cory, MD  ibuprofen (ADVIL) 600 MG tablet Take 1 tablet (600 mg total) by mouth every 6 (six) hours as needed. 02/11/23   Donovan Kail, PA-C  loperamide (IMODIUM A-D) 2 MG tablet Take 1 tablet (2 mg total) by mouth 3 (three) times daily as needed for diarrhea or loose stools. 02/15/23 03/17/23  Donovan Kail, PA-C  loratadine (CLARITIN) 10 MG tablet Take 1 tablet (10 mg total) by mouth daily. Patient taking differently: Take 10 mg by mouth daily as needed for allergies. 01/22/19   Alba Cory, MD  Multiple Vitamin (MULTIVITAMIN) tablet Take 1 tablet by mouth daily.    [provider]  ondansetron (ZOFRAN-ODT) 4 MG disintegrating tablet  Take 1 tablet (4 mg total) by mouth every 6 (six) hours as needed for nausea. 02/11/23   Donovan Kail, PA-C  oxyCODONE (OXY IR/ROXICODONE) 5 MG immediate release tablet Take 1 tablet (5 mg total) by mouth every 6 (six) hours as needed for severe pain or breakthrough pain. 02/11/23   Donovan Kail, PA-C  PHENobarbital (LUMINAL) 64.8 MG tablet Take 1 tablet (64.8 mg total) by mouth 2 (two) times daily. 12/10/22   Alba Cory, MD  PREVIDENT 5000  BOOSTER PLUS 1.1 % PSTE See admin instructions. 01/21/23   [provider]  PREVIDENT 5000 SENSITIVE 1.1-5 % GEL Place onto teeth 2 (two) times daily. 05/11/21   [provider]  rosuvastatin (CRESTOR) 10 MG tablet Take 1 tablet (10 mg total) by mouth daily. 12/10/22   Alba Cory, MD  Sodium Sulfate-Mag Sulfate-KCl (SUTAB) [phone] MG TABS At 5 PM take 12 tablets using the 8 oz cup provided in the kit drinking 5 cups of water and 5 hours before your procedure repeat the same process. 01/27/23   Wyline Mood, MD  valACYclovir (VALTREX) 1000 MG tablet TAKE 1 TABLET BY MOUTH TWICE DAILY AS NEEDED FOR  OUTBREAK 08/29/22   Alba Cory, MD    Physical Exam   Triage Vital Signs: ED Triage Vitals  Enc Vitals Group     BP 02/19/23 0149 98/75     Pulse Rate 02/19/23 0149 (!) 127     Resp 02/19/23 0149 20     Temp 02/19/23 0151 100 F (37.8 C)     Temp Source 02/19/23 0149 Oral     SpO2 02/19/23 0149 93 %     Weight --      Height --      Head Circumference --      Peak Flow --      Pain Score 02/19/23 0150 7     Pain Loc --      Pain Edu? --      Excl. in GC? --     Most recent vital signs: Vitals:   02/19/23 0415 02/19/23 0430  BP: 95/61 90/67  Pulse: (!) 107 66  Resp: 19 17  Temp:    SpO2: 90% 91%    CONSTITUTIONAL: Alert, agitated, resisting staff, intellectual disability, febrile HEAD: Normocephalic, atraumatic EYES: Conjunctivae clear, pupils appear equal, sclera nonicteric ENT: normal nose; moist mucous membranes NECK: Supple, normal ROM CARD: Regular and tachycardic; S1 and S2 appreciated RESP: Normal chest excursion without splinting or tachypnea; breath sounds clear and equal bilaterally; no wheezes, no rhonchi, no rales, no hypoxia or respiratory distress, speaking full sentences ABD/GI: Non-distended; soft, non-tender, no rebound, no guarding, no peritoneal signs, incision sites are clean, dry and intact BACK: The back appears normal EXT:  Normal ROM in all joints; no deformity noted, no edema SKIN: Normal color for age and race; warm; no rash on exposed skin NEURO: Moves all extremities equally, agitated.   ED Results / Procedures / Treatments   LABS: (all labs ordered are listed, but only abnormal results are displayed) Labs Reviewed  LACTIC ACID, PLASMA - Abnormal; Notable for the following components:      Result Value   Lactic Acid, Venous 4.6 (*)    All other components within normal limits  COMPREHENSIVE METABOLIC PANEL - Abnormal; Notable for the following components:   Sodium 125 (*)    Potassium 3.1 (*)    Chloride 86 (*)    Glucose, Bld 206 (*)    BUN  36 (*)    Creatinine, Ser 1.45 (*)    Calcium 8.2 (*)    Total Protein 6.3 (*)    Albumin 3.1 (*)    GFR, Estimated 58 (*)    All other components within normal limits  PROTIME-INR - Abnormal; Notable for the following components:   Prothrombin Time 17.5 (*)    INR 1.5 (*)    All other components within normal limits  APTT - Abnormal; Notable for the following components:   aPTT 23 (*)    All other components within normal limits  TROPONIN I (HIGH SENSITIVITY) - Abnormal; Notable for the following components:   Troponin I (High Sensitivity) 48 (*)    All other components within normal limits  RESP PANEL BY RT-PCR (RSV, FLU A&B, COVID)  RVPGX2  CULTURE, BLOOD (ROUTINE X 2)  CULTURE, BLOOD (ROUTINE X 2)  URINE CULTURE  GASTROINTESTINAL PANEL BY PCR, STOOL (REPLACES STOOL CULTURE)  C DIFFICILE QUICK SCREEN W PCR REFLEX    CBC WITH DIFFERENTIAL/PLATELET  CARBAMAZEPINE LEVEL, TOTAL  LIPASE, BLOOD  MAGNESIUM  LACTIC ACID, PLASMA  URINALYSIS, COMPLETE (UACMP) WITH MICROSCOPIC  PHENOBARBITAL LEVEL  PROCALCITONIN  LEGIONELLA PNEUMOPHILA SEROGP 1 UR AG  STREP PNEUMONIAE URINARY ANTIGEN     EKG:  EKG Interpretation  Date/Time:  Saturday February 19 2023 01:47:51 EDT Ventricular Rate:  130 PR Interval:  103 QRS Duration: 134 QT Interval:  312 QTC  Calculation: 459 R Axis:   115 Text Interpretation: Sinus tachycardia RBBB and LPFB ST elev, probable normal early repol pattern Confirmed by Rochele Raring [phone]) on 02/19/2023 2:24:24 AM         RADIOLOGY: My personal review and interpretation of imaging: CT scan shows distal bowel obstruction.  He also has bilateral lower lobe pneumonia.  I have personally reviewed all radiology reports.   CT CHEST ABDOMEN PELVIS W CONTRAST  Result Date: 02/19/2023 CLINICAL DATA:  Sepsis. EXAM: CT CHEST, ABDOMEN, AND PELVIS WITH CONTRAST TECHNIQUE: Multidetector CT imaging of the chest, abdomen and pelvis was performed following the standard protocol during bolus administration of intravenous contrast. RADIATION DOSE REDUCTION: This exam was performed according to the departmental dose-optimization program which includes automated exposure control, adjustment of the mA and/or kV according to patient size and/or use of iterative reconstruction technique. CONTRAST:  OMNIPAQUE IOHEXOL 300 MG/ML  SOLN COMPARISON:  January 28, 2023 FINDINGS: CT CHEST FINDINGS Cardiovascular: No significant vascular findings. Normal heart size. No pericardial effusion. Mediastinum/Nodes: No enlarged mediastinal, hilar, or axillary lymph nodes. Thyroid gland, trachea, and esophagus demonstrate no significant findings. Lungs/Pleura: Marked severity patchy infiltrates are seen throughout the bilateral lower lobes. Mild right middle lobe infiltrate is also noted. There is no evidence of a pleural effusion or pneumothorax. Musculoskeletal: No chest wall mass or suspicious bone lesions identified. CT ABDOMEN PELVIS FINDINGS Hepatobiliary: No focal liver abnormality is seen. Status post cholecystectomy. No biliary dilatation. Pancreas: Unremarkable. No pancreatic ductal dilatation or surrounding inflammatory changes. Spleen: Normal in size without focal abnormality. Adrenals/Urinary Tract: Adrenal glands are unremarkable. Kidneys are normal,  without renal calculi, focal lesion, or hydronephrosis. There is mild diffuse urinary bladder wall thickening. Stomach/Bowel: Stomach is within normal limits. Surgically anastomosed bowel is seen within the anterior aspect of the right upper quadrant, consistent with the patient's history of prior laparoscopic right colectomy. Surgical clips are also seen within the mid right abdomen. Numerous dilated small bowel loops are seen throughout the abdomen and pelvis (maximum small bowel diameter of approximately 4.3 cm). A  gradual transition zone is seen within the lateral aspect of the mid to upper right abdomen. Vascular/Lymphatic: Aortic atherosclerosis. No enlarged abdominal or pelvic lymph nodes. Reproductive: Mild prostate gland enlargement is seen. Other: A 4.2 cm x 1.8 cm fat containing right inguinal hernia is seen. An additional 2.8 cm x 1.2 cm fat containing left inguinal hernia is noted. There is a 4.4 cm x 2.2 cm fluid-filled left scrotal hernia. No abdominopelvic ascites. Musculoskeletal: No acute or significant osseous findings. IMPRESSION: 1. Marked severity bilateral lower lobe and mild right middle lobe infiltrate, consistent with multifocal pneumonia. 2. Findings consistent with a partial small bowel obstruction. 3. Evidence of prior cholecystectomy and right colectomy. 4. Bilateral fat containing inguinal hernias. 5. Fluid-filled left scrotal hernia. 6. Mild prostate gland enlargement. 7. Mild diffuse urinary bladder wall thickening which may be, in part, secondary to chronic bladder outlet obstruction. Correlation with urinalysis is recommended to exclude the presence of acute cystitis. 8. Aortic atherosclerosis. Aortic Atherosclerosis (ICD10-I70.0). Electronically Signed   By: Aram Candela M.D.   On: 02/19/2023 03:43   DG Chest Port 1 View  Result Date: 02/19/2023 CLINICAL DATA:  Sepsis EXAM: PORTABLE CHEST 1 VIEW COMPARISON:  01/22/2019 FINDINGS: Lung volumes are small and there is left  basilar atelectasis. No pneumothorax or pleural effusion. Cardiac size within normal limits. Pulmonary vascularity is normal. No acute bone abnormality. IMPRESSION: 1. Pulmonary hypoinflation. Electronically Signed   By: Helyn Numbers M.D.   On: 02/19/2023 02:42     PROCEDURES:  Critical Care performed: Yes, see critical care procedure note(s)   CRITICAL CARE Performed by: Baxter Hire Tynesha Free   Total critical care time: 45 minutes  Critical care time was exclusive of separately billable procedures and treating other patients.  Critical care was necessary to treat or prevent imminent or life-threatening deterioration.  Critical care was time spent personally by me on the following activities: development of treatment plan with patient and/or surrogate as well as nursing, discussions with consultants, evaluation of patient's response to treatment, examination of patient, obtaining history from patient or surrogate, ordering and performing treatments and interventions, ordering and review of laboratory studies, ordering and review of radiographic studies, pulse oximetry and re-evaluation of patient's condition.   Marland Kitchen1-3 Lead EKG Interpretation  Performed by: Neva Ramaswamy, Layla Maw, DO Authorized by: Reatha Sur, Layla Maw, DO     Interpretation: abnormal     ECG rate:  127   ECG rate assessment: tachycardic     Rhythm: sinus tachycardia     Ectopy: none     Conduction: normal       IMPRESSION / MDM / ASSESSMENT AND PLAN / ED COURSE  I reviewed the triage vital signs and the nursing notes.    Patient here with septic shock.  Febrile, tachycardic, hypotensive, tachypneic.  The patient is on the cardiac monitor to evaluate for evidence of arrhythmia and/or significant heart rate changes.   DIFFERENTIAL DIAGNOSIS (includes but not limited to):   Septic shock.  Causes include postoperative infection, abscess, bowel perforation, ischemic bowel, bile leak, pancreatitis, bowel obstruction, colitis, viral  gastroenteritis, dehydration, pneumonia, UTI.  Mother also reports 2 episodes that sound like syncopal events but unable to rule out seizures.  Will check Tegretol and phenobarb levels.  He appears to be at his neurologic baseline currently.   Patient's presentation is most consistent with acute presentation with potential threat to life or bodily function.   PLAN: Will obtain labs, cultures, urine, CT of the chest, abdomen pelvis.  Will  give 30 mL/kg IV fluid bolus, broad-spectrum antibiotics, Tylenol.  Will give fentanyl, Zofran for symptomatic management.  Patient has required ketamine in the past for agitation.   MEDICATIONS GIVEN IN ED: Medications  lactated ringers infusion ( Intravenous New Bag/Given 02/19/23 0350)  vancomycin (VANCOREADY) IVPB 1750 mg/350 mL (1,750 mg Intravenous New Bag/Given 02/19/23 0252)  docusate sodium (COLACE) capsule 100 mg (has no administration in time range)  polyethylene glycol (MIRALAX / GLYCOLAX) packet 17 g (has no administration in time range)  heparin injection 5,000 Units (has no administration in time range)  0.9 %  sodium chloride infusion (has no administration in time range)  norepinephrine (LEVOPHED)  in (0.016 mg/mL) premix infusion (has no administration in time range)  ipratropium-albuterol (DUONEB) 0.5-2.5 (3) MG/3ML nebulizer solution 3 mL (has no administration in time range)  ipratropium-albuterol (DUONEB) 0.5-2.5 (3) MG/3ML nebulizer solution 3 mL (has no administration in time range)  lactated ringers bolus 1,000 mL (0 mLs Intravenous Stopped 02/19/23 0346)    And  lactated ringers bolus 1,000 mL (0 mLs Intravenous Stopped 02/19/23 0431)    And  lactated ringers bolus 250 mL (250 mLs Intravenous New Bag/Given 02/19/23 0330)  ceFEPIme (MAXIPIME) 2 g in sodium chloride 0.9 % 100 mL IVPB (0 g Intravenous Stopped 02/19/23 0238)  metroNIDAZOLE (FLAGYL) IVPB 500 mg (0 mg Intravenous Stopped 02/19/23 0330)  fentaNYL (SUBLIMAZE)  injection 50 mcg (50 mcg Intravenous Given 02/19/23 0226)  ondansetron (ZOFRAN) injection 4 mg (4 mg Intravenous Given 02/19/23 0226)  acetaminophen (TYLENOL) tablet 1,000 mg (1,000 mg Oral Given 02/19/23 0221)  ketamine 50 mg in normal saline 5 mL (10 mg/mL) syringe (22 mg Intravenous Given 02/19/23 0309)  lactated ringers bolus 750 mL (750 mLs Intravenous New Bag/Given 02/19/23 0331)  iohexol (OMNIPAQUE) 300 MG/ML solution 100 mL (100 mLs Intravenous Contrast Given 02/19/23 0317)  potassium chloride 10 mEq in 100 mL IVPB (10 mEq Intravenous New Bag/Given 02/19/23 0340)     ED COURSE: Labs show sodium of 125.  Slight AKI with creatinine of 1.45.  Normal LFTs.  Lactic of 4.6.  Tegretol level normal.  Additional labs, urine, stool studies pending.  Chest x-ray reviewed and interpreted by myself and the radiologist and shows pulm inflation but no acute abnormality.  CT scans pending.  3:49 AM  CT scan reviewed and interpreted by myself and the radiologist.  Patient has a distal partial small bowel obstruction with gradual transition zone.  He also has significant bilateral lower lobe pneumonia.  Will place NG tube if patient is able to tolerate it.  Will continue IV fluids and keep NPO.  He is getting broad-spectrum antibiotics.  Will discuss with general surgery.   4:00 AM  Discussed with Dr. Aleen Campi who is reviewed patient's images.  Agrees with placement of NG tube if patient is vomiting.  He thinks this could be a bowel obstruction versus an ileus.  Agrees with admission to medicine for pneumonia which is likely the cause of his sepsis.  Patient does not need any emergent or urgent surgery at this time.  Surgery will follow along in consultation.  Blood pressures improving but continues to have some systolics under 90 even after 3 L IV fluids.  Will order Levophed as needed.  Still intermittently agitated.  No vomiting, diarrhea. Will discuss with critical care.  CONSULTS:  Consulted and discussed  patient's case with Webb Silversmith, NP with critical care.  I have recommended admission and consulting provider agrees and will place admission orders.  Patient (and family if present) agree with this plan.   I reviewed all nursing notes, vitals, pertinent previous records.  All labs, EKGs, imaging ordered have been independently reviewed and interpreted by myself.    OUTSIDE RECORDS REVIEWED: Reviewed recent general surgery notes.       FINAL CLINICAL IMPRESSION(S) / ED DIAGNOSES   Final diagnoses:  Sepsis with hypotension  Hyponatremia  AKI (acute kidney injury)  HCAP (healthcare-associated pneumonia)  SBO (small bowel obstruction)  Nausea vomiting and diarrhea  Hypokalemia     Rx / DC Orders   ED Discharge Orders     None        Note:  This document was prepared using Dragon voice recognition software and may include unintentional dictation errors.   Detra Bores, Layla Maw, DO 02/19/23 0409    Andrian Sabala, Layla Maw, DO 02/19/23 [phone]

## 2023-02-19 NOTE — Sepsis Progress Note (Signed)
Elink monitoring for the code sepsis protocol.  

## 2023-02-19 NOTE — Consult Note (Signed)
Date of Consultation:  02/19/2023  Requesting Physician:  Rochele Raring, MD  Reason for Consultation:  Small bowel obstruction  History of Present Illness: Alec Campbell is a 52 y.o. male status post recent laparoscopic hand-assisted right colectomy and cholecystectomy with Dr. Everlene Farrier on 02/10/23.  The patient was discharged the next day in good condition but his mother reports that once at home she started developing significant diarrhea.  This was associated with nausea as well as vomiting.  However she denies noticing any abdominal pain on him.  They were seen in the office on 02/16/2023 and at that point main recommendation was for hydration and did not appear toxic.  The patient's mother reports that she continued having diarrhea and vomiting.  He was able to keep some fluids down with Pedialyte and soup.  However last night, the patient's mother noted that he was more altered and in respiratory distress.  EMS was called to bring the patient to the hospital.  He was noted to be febrile with a fever of 103.2 rectally as well as tachycardic to the 130s, tachypneic, and hypotensive.  He was given IV fluid boluses and was started on sepsis management with broad-spectrum IV antibiotics.  He also had a CT scan of the chest, abdomen, and pelvis which showed significant bilateral lower lobe pneumonias as well as dilated small bowel loops with somewhat transition point in the right abdomen leading up to the anastomosis.  Anastomosis itself is intact.  NG tube was placed and due to his hypotension and sepsis, was admitted to the ICU for further management.  Currently he has not required pressor support.  Past Medical History: Past Medical History:  Diagnosis Date   Allergy    Moderate mental retardation    Seizures    Vitamin D deficiency      Past Surgical History: Past Surgical History:  Procedure Laterality Date   ANKLE SURGERY Right    CHOLECYSTECTOMY N/A 02/10/2023   Procedure: LAPAROSCOPIC  CHOLECYSTECTOMY;  Surgeon: Leafy Ro, MD;  Location: [facility] ORS;  Service: General;  Laterality: N/A;   CLEFT LIP REPAIR     COLONOSCOPY N/A 12/30/2022   Procedure: COLONOSCOPY;  Surgeon: Wyline Mood, MD;  Location: [facility] ENDOSCOPY;  Service: Gastroenterology;  Laterality: N/A;  SPECIAL NEEDS - MOTHER NEEDS TO ACCOMPANY HIM.   ESOPHAGOGASTRODUODENOSCOPY (EGD) WITH PROPOFOL N/A 12/30/2022   Procedure: ESOPHAGOGASTRODUODENOSCOPY (EGD) WITH PROPOFOL;  Surgeon: Wyline Mood, MD;  Location: [facility] ENDOSCOPY;  Service: Gastroenterology;  Laterality: N/A;   HERNIA REPAIR     Inguinal   LAPAROSCOPIC RIGHT COLECTOMY N/A 02/10/2023   Procedure: LAPAROSCOPIC RIGHT COLECTOMY, hand assisted, RNFA to assist;  Surgeon: Leafy Ro, MD;  Location: [facility] ORS;  Service: General;  Laterality: N/A;   PALATE SURGERY     TYMPANOSTOMY TUBE PLACEMENT      Home Medications: Prior to Admission medications   Medication Sig Start Date End Date Taking? Authorizing Provider  carbamazepine (TEGRETOL) 200 MG tablet TAKE 2 TABLETS BY MOUTH IN THE MORNING, THEN TAKE  1 TABLET AT LUNCH AND TAKE 1 AT NIGHT 12/10/22   Alba Cory, MD  ibuprofen (ADVIL) 600 MG tablet Take 1 tablet (600 mg total) by mouth every 6 (six) hours as needed. 02/11/23   Donovan Kail, PA-C  loperamide (IMODIUM A-D) 2 MG tablet Take 1 tablet (2 mg total) by mouth 3 (three) times daily as needed for diarrhea or loose stools. 02/15/23 03/17/23  Donovan Kail, PA-C  loratadine (CLARITIN) 10 MG tablet  Take 1 tablet (10 mg total) by mouth daily. Patient taking differently: Take 10 mg by mouth daily as needed for allergies. 01/22/19   Alba Cory, MD  Multiple Vitamin (MULTIVITAMIN) tablet Take 1 tablet by mouth daily.    [provider]  ondansetron (ZOFRAN-ODT) 4 MG disintegrating tablet Take 1 tablet (4 mg total) by mouth every 6 (six) hours as needed for nausea. 02/11/23   Donovan Kail, PA-C  oxyCODONE (OXY IR/ROXICODONE) 5 MG  immediate release tablet Take 1 tablet (5 mg total) by mouth every 6 (six) hours as needed for severe pain or breakthrough pain. 02/11/23   Donovan Kail, PA-C  PHENobarbital (LUMINAL) 64.8 MG tablet Take 1 tablet (64.8 mg total) by mouth 2 (two) times daily. 12/10/22   Alba Cory, MD  PREVIDENT 5000 BOOSTER PLUS 1.1 % PSTE See admin instructions. 01/21/23   [provider]  PREVIDENT 5000 SENSITIVE 1.1-5 % GEL Place onto teeth 2 (two) times daily. 05/11/21   [provider]  rosuvastatin (CRESTOR) 10 MG tablet Take 1 tablet (10 mg total) by mouth daily. 12/10/22   Alba Cory, MD  Sodium Sulfate-Mag Sulfate-KCl (SUTAB) [phone] MG TABS At 5 PM take 12 tablets using the 8 oz cup provided in the kit drinking 5 cups of water and 5 hours before your procedure repeat the same process. 01/27/23   Wyline Mood, MD  valACYclovir (VALTREX) 1000 MG tablet TAKE 1 TABLET BY MOUTH TWICE DAILY AS NEEDED FOR  OUTBREAK 08/29/22   Alba Cory, MD    Allergies: No Known Allergies  Social History:  reports that he has never smoked. He has never been exposed to tobacco smoke. He has never used smokeless tobacco. He reports that he does not drink alcohol and does not use drugs.   Family History: Family History  Problem Relation Age of Onset   Hypertension Mother    Thyroid disease Mother    CAD Father    Hypertension Father    Hyperlipidemia Father    Healthy Brother    Healthy Sister    Diabetes Brother    COPD Brother    Emphysema Brother    Alcohol abuse Brother    Hypertension Brother    Aneurysm Brother        brain    Review of Systems: Review of Systems  Constitutional:  Positive for chills and fever.  HENT:  Negative for hearing loss.   Respiratory:  Positive for shortness of breath.   Cardiovascular:  Negative for chest pain.  Gastrointestinal:  Positive for diarrhea, nausea and vomiting. Negative for abdominal pain.  Genitourinary:  Negative for dysuria.   Musculoskeletal:  Negative for myalgias.  Skin:  Negative for rash.  Neurological:  Negative for dizziness.  Psychiatric/Behavioral:  Negative for depression.     Physical Exam BP (!) 100/58   Pulse (!) 106   Temp 98 F (36.7 C) (Oral)   Resp (!) 22   Ht  (1.778 m)   Wt 75.6 kg   SpO2 93%   BMI 23.91 kg/m  CONSTITUTIONAL: No acute distress HEENT:  Normocephalic, atraumatic, extraocular motion intact. NECK: Trachea is midline, and there is no jugular venous distension. RESPIRATORY: Currently with better respiratory effort and no respiratory distress at the moment. CARDIOVASCULAR: Low-grade tachycardia, sinus rhythm.. GI: The abdomen is soft, distended, nontender to palpation.  Midline incision is clean, dry, intact without any evidence of infection.  NG tube is in place with bilious contents in the canister.  MUSCULOSKELETAL:  Normal muscle strength and tone in all four extremities.  No peripheral edema or cyanosis. SKIN: Skin turgor is normal. There are no pathologic skin lesions.  NEUROLOGIC:  Motor and sensation is grossly normal.  Cranial nerves are grossly intact. PSYCH: Unable to assess..  Laboratory Analysis: Results for orders placed or performed during the hospital encounter of 02/19/23 (from the past 24 hour(s))  Lactic acid, plasma     Status: Abnormal   Collection Time: 02/19/23  2:10 AM  Result Value Ref Range   Lactic Acid, Venous 4.6 (HH) 0.5 - 1.9 mmol/L  Comprehensive metabolic panel     Status: Abnormal   Collection Time: 02/19/23  2:10 AM  Result Value Ref Range   Sodium 125 (L) 135 - 145 mmol/L   Potassium 3.1 (L) 3.5 - 5.1 mmol/L   Chloride 86 (L) 98 - 111 mmol/L   CO2 24 22 - 32 mmol/L   Glucose, Bld 206 (H) 70 - 99 mg/dL   BUN 36 (H) 6 - 20 mg/dL   Creatinine, Ser 1.61 (H) 0.61 - 1.24 mg/dL   Calcium 8.2 (L) 8.9 - 10.3 mg/dL   Total Protein 6.3 (L) 6.5 - 8.1 g/dL   Albumin 3.1 (L) 3.5 - 5.0 g/dL   AST 33 15 - 41 U/L   ALT 21 0 - 44 U/L    Alkaline Phosphatase 110 38 - 126 U/L   Total Bilirubin 1.0 0.3 - 1.2 mg/dL   GFR, Estimated 58 (L) >60 mL/min   Anion gap 15 5 - 15  CBC with Differential     Status: None   Collection Time: 02/19/23  2:10 AM  Result Value Ref Range   WBC 8.4 4.0 - 10.5 K/uL   RBC 4.72 4.22 - 5.81 MIL/uL   Hemoglobin 13.8 13.0 - 17.0 g/dL   HCT 09.6 04.5 - 40.9 %   MCV 83.9 80.0 - 100.0 fL   MCH 29.2 26.0 - 34.0 pg   MCHC 34.8 30.0 - 36.0 g/dL   RDW 81.1 91.4 - 78.2 %   Platelets 381 150 - 400 K/uL   nRBC 0.0 0.0 - 0.2 %   Neutrophils Relative % 82 %   Neutro Abs 6.8 1.7 - 7.7 K/uL   Lymphocytes Relative 16 %   Lymphs Abs 1.3 0.7 - 4.0 K/uL   Monocytes Relative 2 %   Monocytes Absolute 0.1 0.1 - 1.0 K/uL   Eosinophils Relative 0 %   Eosinophils Absolute 0.0 0.0 - 0.5 K/uL   Basophils Relative 0 %   Basophils Absolute 0.0 0.0 - 0.1 K/uL   Immature Granulocytes 0 %   Abs Immature Granulocytes 0.03 0.00 - 0.07 K/uL  Protime-INR     Status: Abnormal   Collection Time: 02/19/23  2:10 AM  Result Value Ref Range   Prothrombin Time 17.5 (H) 11.4 - 15.2 seconds   INR 1.5 (H) 0.8 - 1.2  APTT     Status: Abnormal   Collection Time: 02/19/23  2:10 AM  Result Value Ref Range   aPTT 23 (L) 24 - 36 seconds  Blood Culture (routine x 2)     Status: None (Preliminary result)   Collection Time: 02/19/23  2:10 AM   Specimen: BLOOD  Result Value Ref Range   Specimen Description BLOOD RIGHT ANTECUBITAL    Special Requests      BOTTLES DRAWN AEROBIC AND ANAEROBIC Blood Culture adequate volume   Culture      NO GROWTH < 12  HOURS Performed at [facility], [address] [place], [place] [address]    Report Status PENDING   Blood Culture (routine x 2)     Status: None (Preliminary result)   Collection Time: 02/19/23  2:10 AM   Specimen: BLOOD  Result Value Ref Range   Specimen Description BLOOD BLOOD RIGHT FOREARM    Special Requests      BOTTLES DRAWN AEROBIC AND ANAEROBIC Blood Culture  adequate volume   Culture      NO GROWTH < 12 HOURS Performed at [facility], [address] [place], [place] [address]    Report Status PENDING   Carbamazepine level, total     Status: None   Collection Time: 02/19/23  2:10 AM  Result Value Ref Range   Carbamazepine Lvl 6.3 4.0 - 12.0 ug/mL  Phenobarbital level     Status: None   Collection Time: 02/19/23  2:10 AM  Result Value Ref Range   Phenobarbital 22.6 15.0 - 40.0 ug/mL  Lipase, blood     Status: None   Collection Time: 02/19/23  2:10 AM  Result Value Ref Range   Lipase 49 11 - 51 U/L  Troponin I (High Sensitivity)     Status: Abnormal   Collection Time: 02/19/23  2:10 AM  Result Value Ref Range   Troponin I (High Sensitivity) 48 (H) <18 ng/L  Procalcitonin     Status: None   Collection Time: 02/19/23  2:10 AM  Result Value Ref Range   Procalcitonin 0.22 ng/mL  Magnesium     Status: None   Collection Time: 02/19/23  2:10 AM  Result Value Ref Range   Magnesium 1.7 1.7 - 2.4 mg/dL  Resp panel by RT-PCR (RSV, Flu A&B, Covid) Anterior Nasal Swab     Status: None   Collection Time: 02/19/23  4:00 AM   Specimen: Anterior Nasal Swab  Result Value Ref Range   SARS Coronavirus 2 by RT PCR NEGATIVE NEGATIVE   Influenza A by PCR NEGATIVE NEGATIVE   Influenza B by PCR NEGATIVE NEGATIVE   Resp Syncytial Virus by PCR NEGATIVE NEGATIVE  Lactic acid, plasma     Status: Abnormal   Collection Time: 02/19/23  6:30 AM  Result Value Ref Range   Lactic Acid, Venous 2.9 (HH) 0.5 - 1.9 mmol/L  Urinalysis, Complete w Microscopic -Urine, Clean Catch     Status: Abnormal   Collection Time: 02/19/23  6:30 AM  Result Value Ref Range   Color, Urine YELLOW (A) YELLOW   APPearance CLOUDY (A) CLEAR   Specific Gravity, Urine 1.045 (H) 1.005 - 1.030   pH 5.0 5.0 - 8.0   Glucose, UA NEGATIVE NEGATIVE mg/dL   Hgb urine dipstick NEGATIVE NEGATIVE   Bilirubin Urine NEGATIVE NEGATIVE   Ketones, ur NEGATIVE NEGATIVE mg/dL   Protein, ur  762 (A) NEGATIVE mg/dL   Nitrite NEGATIVE NEGATIVE   Leukocytes,Ua NEGATIVE NEGATIVE   RBC / HPF 0-5 0 - 5 RBC/hpf   WBC, UA 0-5 0 - 5 WBC/hpf   Bacteria, UA NONE SEEN NONE SEEN   Squamous Epithelial / HPF 0-5 0 - 5 /HPF   Mucus PRESENT    Hyaline Casts, UA PRESENT    Amorphous Crystal PRESENT   MRSA Next Gen by PCR, Nasal     Status: None   Collection Time: 02/19/23  8:19 AM   Specimen: Nasal Mucosa; Nasal Swab  Result Value Ref Range   MRSA by PCR Next Gen NOT DETECTED NOT  DETECTED    Imaging: DG Abdomen 1 View  Result Date: 02/19/2023 CLINICAL DATA:  52 year old male status post nasogastric tube placement. EXAM: ABDOMEN - 1 VIEW COMPARISON:  Abdominal radiograph 02/19/2023. FINDINGS: Nasogastric tube has been advanced slightly, now with side port approximately 3 cm distal to the gastroesophageal junction. Several dilated loops of gas-filled small bowel remain evident in the visualized upper abdomen measuring up to 5.4 cm in diameter. Surgical clips project over the right upper quadrant of the abdomen, likely from prior cholecystectomy. IMPRESSION: 1. Support apparatus, as above. 2. Persistent dilatation of small-bowel loops concerning for small bowel obstruction, as above. Electronically Signed   By: Trudie Reed M.D.   On: 02/19/2023 07:41   DG Abd 1 View  Result Date: 02/19/2023 CLINICAL DATA:  52 year old male status post nasogastric tube placement. EXAM: ABDOMEN - 1 VIEW COMPARISON:  No priors. FINDINGS: Nasogastric tube noted with tip in the proximal stomach and side port just proximal to the distal gastroesophageal junction. Abdomen is incompletely imaged, but there appear to be multiple dilated loops of small bowel which are fluid-filled, concerning for bowel obstruction, measuring up to proximally 5.2 cm in diameter in the right upper quadrant of the abdomen. IMPRESSION: 1. Nasogastric tube in position, as above. Advancement of the tube approximately 10 cm for more optimal  placement is suggested. 2. Dilated loops of small bowel concerning for small bowel obstruction, as above. Electronically Signed   By: Trudie Reed M.D.   On: 02/19/2023 06:33   CT CHEST ABDOMEN PELVIS W CONTRAST  Result Date: 02/19/2023 CLINICAL DATA:  Sepsis. EXAM: CT CHEST, ABDOMEN, AND PELVIS WITH CONTRAST TECHNIQUE: Multidetector CT imaging of the chest, abdomen and pelvis was performed following the standard protocol during bolus administration of intravenous contrast. RADIATION DOSE REDUCTION: This exam was performed according to the departmental dose-optimization program which includes automated exposure control, adjustment of the mA and/or kV according to patient size and/or use of iterative reconstruction technique. CONTRAST:  OMNIPAQUE IOHEXOL 300 MG/ML  SOLN COMPARISON:  January 28, 2023 FINDINGS: CT CHEST FINDINGS Cardiovascular: No significant vascular findings. Normal heart size. No pericardial effusion. Mediastinum/Nodes: No enlarged mediastinal, hilar, or axillary lymph nodes. Thyroid gland, trachea, and esophagus demonstrate no significant findings. Lungs/Pleura: Marked severity patchy infiltrates are seen throughout the bilateral lower lobes. Mild right middle lobe infiltrate is also noted. There is no evidence of a pleural effusion or pneumothorax. Musculoskeletal: No chest wall mass or suspicious bone lesions identified. CT ABDOMEN PELVIS FINDINGS Hepatobiliary: No focal liver abnormality is seen. Status post cholecystectomy. No biliary dilatation. Pancreas: Unremarkable. No pancreatic ductal dilatation or surrounding inflammatory changes. Spleen: Normal in size without focal abnormality. Adrenals/Urinary Tract: Adrenal glands are unremarkable. Kidneys are normal, without renal calculi, focal lesion, or hydronephrosis. There is mild diffuse urinary bladder wall thickening. Stomach/Bowel: Stomach is within normal limits. Surgically anastomosed bowel is seen within the anterior aspect  of the right upper quadrant, consistent with the patient's history of prior laparoscopic right colectomy. Surgical clips are also seen within the mid right abdomen. Numerous dilated small bowel loops are seen throughout the abdomen and pelvis (maximum small bowel diameter of approximately 4.3 cm). A gradual transition zone is seen within the lateral aspect of the mid to upper right abdomen. Vascular/Lymphatic: Aortic atherosclerosis. No enlarged abdominal or pelvic lymph nodes. Reproductive: Mild prostate gland enlargement is seen. Other: A 4.2 cm x 1.8 cm fat containing right inguinal hernia is seen. An additional 2.8 cm x 1.2 cm fat containing  left inguinal hernia is noted. There is a 4.4 cm x 2.2 cm fluid-filled left scrotal hernia. No abdominopelvic ascites. Musculoskeletal: No acute or significant osseous findings. IMPRESSION: 1. Marked severity bilateral lower lobe and mild right middle lobe infiltrate, consistent with multifocal pneumonia. 2. Findings consistent with a partial small bowel obstruction. 3. Evidence of prior cholecystectomy and right colectomy. 4. Bilateral fat containing inguinal hernias. 5. Fluid-filled left scrotal hernia. 6. Mild prostate gland enlargement. 7. Mild diffuse urinary bladder wall thickening which may be, in part, secondary to chronic bladder outlet obstruction. Correlation with urinalysis is recommended to exclude the presence of acute cystitis. 8. Aortic atherosclerosis. Aortic Atherosclerosis (ICD10-I70.0). Electronically Signed   By: Aram Candela M.D.   On: 02/19/2023 03:43   DG Chest Port 1 View  Result Date: 02/19/2023 CLINICAL DATA:  Sepsis EXAM: PORTABLE CHEST 1 VIEW COMPARISON:  01/22/2019 FINDINGS: Lung volumes are small and there is left basilar atelectasis. No pneumothorax or pleural effusion. Cardiac size within normal limits. Pulmonary vascularity is normal. No acute bone abnormality. IMPRESSION: 1. Pulmonary hypoinflation. Electronically Signed   By:  Helyn Numbers M.D.   On: 02/19/2023 02:42    Assessment and Plan: This is a 52 y.o. male with bilateral pneumonia, possible small bowel obstruction vs viral illness.  --Discussed with the patient's mother that likely the pneumonia may be related to aspiration with all the emesis he has had.  He's now on antibiotics for this.  In regards to the SBO, it is unclear if this is truly sbo vs GI infection given all the diarrhea and vomiting he has had.  Possibly the dilated small bowel could be ileus related.  Would be unusual to have a blockage this early after surgery, though cannot rule it out.  Nonetheless, would still recommend conservative management with NPO, NG tube to suction, until we know better what his bowel function is like.  Would also recommend obtaining GI panel testing and cdiff testing since he did have antibiotics as part of his surgery last week, and his symptoms started right after discharge home. --No surgery is needed at this point.  Will continue following with you.  I spent 60 minutes dedicated to the care of this patient on the date of this encounter to include pre-visit review of records, face-to-face time with the patient discussing diagnosis and management, and any post-visit coordination of care.   Howie Ill, MD [facility]g:  [phone]

## 2023-02-19 NOTE — H&P (Addendum)
NAME:  Alec Campbell, MRN:  [id number], DOB:  17-Apr-1971, LOS: 0 ADMISSION DATE:  02/19/2023, CONSULTATION DATE:  02/19/23 REFERRING MD:  Rochele Raring CHIEF COMPLAINT:  Diarrhea, syncope, N/V    HPI  52 y.o male with significant PMH of mild mental retardation/developmental delay, seizure disorder, IDA, HLD,  bilateral Inguinal hernia, ascending colonic mass with tubular adenoma with high grade dysplasia, recent right hemicolectomy with stapled ileocolostomy and Laparoscopic cholecystectomy who presented to the ED with chief complaints of  diarrhea, nausea, vomiting, hiccups, fevers and chills x 7 days.  Patient underwent uncomplicated right-sided colectomy for an ascending colon mass and cholecystectomy for cholelithiasis and chronic cholecystitis by Dr. Everlene Farrier on 02/10/2023.  He was discharge don 02/11/2023 in stable condition. Per pt's mother, patient developed nausea, vomiting and diarrhea on Saturday, April 13.  Was seen by Dr. Everlene Farrier in the office on 02/16/2023 and 2 L of fluids were ordered for 02/17/2023.  Patient's mother reported new fever and 2 syncopal episodes and possible myoclonic jerks last night with brief LOC.  Unclear if episode was true seizures or not but per mother "he came back too quickly unlike his true seizure".  He is febrile and hypotensive with EMS    ED Course: Initial vital signs showed HR of beats/minute, BP mm Hg, the RR 30 breaths/minute, and the oxygen saturation % on and a temperature of 98.55F (36.9C).   Pertinent Labs/Diagnostics Findings: Na+/ K+: 125/3.1  Glucose: 206 BUN/Cr.:36/1.145 WBC: Unremarkable  PCT: pending Lactic acid: 4.6 COVID PCR: Negative, Troponin:48   CT Abd/pelvis>multifocal pneumonia. Partial bowel obstruction and ?acute cystitis.  Patient given 30 cc/kg of fluids and started on broad-spectrum antibiotics Vanco cefepime and Flagyl for sepsis with septic shock. Patient remained hypotensive despite IVF boluses therefore was started on Levophed.   (Sepsis reassessment completed). PCCM consulted.  Past Medical History  mild mental retardation/developmental delay, seizure disorder, IDA, HLD,  bilateral Inguinal hernia, ascending colonic mass with tubular adenoma with high grade dysplasia, recent right hemicolectomy with stapled ileocolostomy and Laparoscopic cholecystectomy   Significant Hospital Events   4/20:Admit to ICU with sepsis secondary to multifocal pneumonia and suspected bowel obstruction  Consults:  General Surgery  Procedures:  None  Significant Diagnostic Tests:  4/20: Chest Xray> IMPRESSION: 1. Pulmonary hypoinflation.    4/20: CT Chest abdomen and pelvis> IMPRESSION: 1. Marked severity bilateral lower lobe and mild right middle lobe infiltrate, consistent with multifocal pneumonia. 2. Findings consistent with a partial small bowel obstruction. 3. Evidence of prior cholecystectomy and right colectomy. 4. Bilateral fat containing inguinal hernias. 5. Fluid-filled left scrotal hernia. 6. Mild prostate gland enlargement. 7. Mild diffuse urinary bladder wall thickening which may be, in part, secondary to chronic bladder outlet obstruction. Correlation with urinalysis is recommended to exclude the presence of acute cystitis. 8. Aortic atherosclerosis.  Interim History / Subjective:    Micro Data:  4/20: Blood culture x2> 4/20: Urine Culture> 4/20: MRSA PCR>>  4/20: Strep pneumo urinary antigen> 4/20: Legionella urinary antigen>  Antimicrobials:  Vancomycin 4/20> Cefepime 4/20> Metronidazole 4/20>  OBJECTIVE  Blood pressure (!) 87/63, pulse (!) 131, temperature 99.8 F (37.7 C), temperature source Oral, resp. rate (!) 21, height 5\' 10"  (1.778 m), weight 76.7 kg, SpO2 (!) 86 %.       No intake or output data in the 24 hours ending 02/19/23 0421 Filed Weights   02/19/23 0356  Weight: 76.7 kg   Physical Examination  GENERAL:  52 year-old critically ill patient lying in the  bed in no acute  distress EYES: PEERLA. No scleral icterus. Extraocular muscles intact.  HEENT: Head atraumatic, normocephalic. Oropharynx and nasopharynx clear.  NECK:  No JVD, supple  LUNGS: Normal breath sounds bilaterally.  No use of accessory muscles of respiration.  CARDIOVASCULAR: S1, S2 normal. No murmurs, rubs, or gallops.  ABDOMEN: Soft, NTND.healing midline incision EXTREMITIES: No swelling or erythema.  Capillary refill is less than 3 seconds in all extremities. Pulses palpable distally. NEUROLOGIC: The patient is alert and oriented to self.  No focal neurological deficit. Cranial nerves are intact.  SKIN: No obvious rash, lesion, or ulcer. Warm to touch Labs/imaging that I havepersonally reviewed  (right click and "Reselect all SmartList Selections" daily)     Labs   CBC: Recent Labs  Lab 02/19/23 0210  WBC 8.4  NEUTROABS 6.8  HGB 13.8  HCT 39.6  MCV 83.9  PLT 381    Basic Metabolic Panel: Recent Labs  Lab 02/19/23 0210  NA 125*  K 3.1*  CL 86*  CO2 24  GLUCOSE 206*  BUN 36*  CREATININE 1.45*  CALCIUM 8.2*  MG 1.7   GFR: Estimated Creatinine Clearance: 61.5 mL/min (A) (by C-G formula based on SCr of 1.45 mg/dL (H)). Recent Labs  Lab 02/19/23 0210  WBC 8.4  LATICACIDVEN 4.6*    Liver Function Tests: Recent Labs  Lab 02/19/23 0210  AST 33  ALT 21  ALKPHOS 110  BILITOT 1.0  PROT 6.3*  ALBUMIN 3.1*   Recent Labs  Lab 02/19/23 0210  LIPASE 49   No results for input(s): "AMMONIA" in the last 168 hours.  ABG No results found for: "PHART", "PCO2ART", "PO2ART", "HCO3", "TCO2", "ACIDBASEDEF", "O2SAT"   Coagulation Profile: Recent Labs  Lab 02/19/23 0210  INR 1.5*    Cardiac Enzymes: No results for input(s): "CKTOTAL", "CKMB", "CKMBINDEX", "TROPONINI" in the last 168 hours.  HbA1C: Hgb A1c MFr Bld  Date/Time Value Ref Range Status  09/06/2017 11:49 AM 5.2 <5.7 % of total Hgb Final    Comment:    For the purpose of screening for the presence  of diabetes: . <5.7%       Consistent with the absence of diabetes 5.7-6.4%    Consistent with increased risk for diabetes             (prediabetes) > or =6.5%  Consistent with diabetes . This assay result is consistent with a decreased risk of diabetes. . Currently, no consensus exists regarding use of hemoglobin A1c for diagnosis of diabetes in children. . According to American Diabetes Association (ADA) guidelines, hemoglobin A1c <7.0% represents optimal control in non-pregnant diabetic patients. Different metrics may apply to specific patient populations.  Standards of Medical Care in Diabetes(ADA). Marland Kitchen   01/19/2017 09:25 AM 5.0 <5.7 % Final    Comment:      For the purpose of screening for the presence of diabetes:   <5.7%       Consistent with the absence of diabetes 5.7-6.4 %   Consistent with increased risk for diabetes (prediabetes) >=6.5 %     Consistent with diabetes   This assay result is consistent with a decreased risk of diabetes.   Currently, no consensus exists regarding use of hemoglobin A1c for diagnosis of diabetes in children.   According to American Diabetes Association (ADA) guidelines, hemoglobin A1c <7.0% represents optimal control in non-pregnant diabetic patients. Different metrics may apply to specific patient populations. Standards of Medical Care in Diabetes (ADA).  CBG: No results for input(s): "GLUCAP" in the last 168 hours.  Review of Systems:   UNABLE TO OBTAIN DUE TO DEVELOPMENTAL DELAY  Past Medical History  He,  has a past medical history of Allergy, Moderate mental retardation, Seizures, and Vitamin D deficiency.   Surgical History    Past Surgical History:  Procedure Laterality Date   ANKLE SURGERY Right    CHOLECYSTECTOMY N/A 02/10/2023   Procedure: LAPAROSCOPIC CHOLECYSTECTOMY;  Surgeon: Leafy Ro, MD;  Location: [facility] ORS;  Service: General;  Laterality: N/A;   CLEFT LIP REPAIR     COLONOSCOPY N/A 12/30/2022    Procedure: COLONOSCOPY;  Surgeon: Wyline Mood, MD;  Location: [facility] ENDOSCOPY;  Service: Gastroenterology;  Laterality: N/A;  SPECIAL NEEDS - MOTHER NEEDS TO ACCOMPANY HIM.   ESOPHAGOGASTRODUODENOSCOPY (EGD) WITH PROPOFOL N/A 12/30/2022   Procedure: ESOPHAGOGASTRODUODENOSCOPY (EGD) WITH PROPOFOL;  Surgeon: Wyline Mood, MD;  Location: [facility] ENDOSCOPY;  Service: Gastroenterology;  Laterality: N/A;   HERNIA REPAIR     Inguinal   LAPAROSCOPIC RIGHT COLECTOMY N/A 02/10/2023   Procedure: LAPAROSCOPIC RIGHT COLECTOMY, hand assisted, RNFA to assist;  Surgeon: Leafy Ro, MD;  Location: [facility] ORS;  Service: General;  Laterality: N/A;   PALATE SURGERY     TYMPANOSTOMY TUBE PLACEMENT       Social History   reports that he has never smoked. He has never been exposed to tobacco smoke. He has never used smokeless tobacco. He reports that he does not drink alcohol and does not use drugs.   Family History   His family history includes Alcohol abuse in his brother; Aneurysm in his brother; CAD in his father; COPD in his brother; Diabetes in his brother; Emphysema in his brother; Healthy in his brother and sister; Hyperlipidemia in his father; Hypertension in his brother, father, and mother; Thyroid disease in his mother.   Allergies No Known Allergies   Home Medications  Prior to Admission medications   Medication Sig Start Date End Date Taking? Authorizing Provider  carbamazepine (TEGRETOL) 200 MG tablet TAKE 2 TABLETS BY MOUTH IN THE MORNING, THEN TAKE  1 TABLET AT LUNCH AND TAKE 1 AT NIGHT 12/10/22   Alba Cory, MD  ibuprofen (ADVIL) 600 MG tablet Take 1 tablet (600 mg total) by mouth every 6 (six) hours as needed. 02/11/23   Donovan Kail, PA-C  loperamide (IMODIUM A-D) 2 MG tablet Take 1 tablet (2 mg total) by mouth 3 (three) times daily as needed for diarrhea or loose stools. 02/15/23 03/17/23  Donovan Kail, PA-C  loratadine (CLARITIN) 10 MG tablet Take 1 tablet (10 mg total) by mouth  daily. Patient taking differently: Take 10 mg by mouth daily as needed for allergies. 01/22/19   Alba Cory, MD  Multiple Vitamin (MULTIVITAMIN) tablet Take 1 tablet by mouth daily.    [provider]  ondansetron (ZOFRAN-ODT) 4 MG disintegrating tablet Take 1 tablet (4 mg total) by mouth every 6 (six) hours as needed for nausea. 02/11/23   Donovan Kail, PA-C  oxyCODONE (OXY IR/ROXICODONE) 5 MG immediate release tablet Take 1 tablet (5 mg total) by mouth every 6 (six) hours as needed for severe pain or breakthrough pain. 02/11/23   Donovan Kail, PA-C  PHENobarbital (LUMINAL) 64.8 MG tablet Take 1 tablet (64.8 mg total) by mouth 2 (two) times daily. 12/10/22   Alba Cory, MD  PREVIDENT 5000 BOOSTER PLUS 1.1 % PSTE See admin instructions. 01/21/23   [provider]  PREVIDENT 5000 SENSITIVE 1.1-5 %  GEL Place onto teeth 2 (two) times daily. 05/11/21   [provider]  rosuvastatin (CRESTOR) 10 MG tablet Take 1 tablet (10 mg total) by mouth daily. 12/10/22   Alba Cory, MD  Sodium Sulfate-Mag Sulfate-KCl (SUTAB) [phone] MG TABS At 5 PM take 12 tablets using the 8 oz cup provided in the kit drinking 5 cups of water and 5 hours before your procedure repeat the same process. 01/27/23   Wyline Mood, MD  valACYclovir (VALTREX) 1000 MG tablet TAKE 1 TABLET BY MOUTH TWICE DAILY AS NEEDED FOR  OUTBREAK 08/29/22   Alba Cory, MD  Scheduled Meds:  heparin  5,000 Units Subcutaneous Q8H   ipratropium-albuterol  3 mL Nebulization Q6H   Continuous Infusions:  sodium chloride 250 mL (02/19/23 0504)   lactated ringers     lactated ringers 150 mL/hr at 02/19/23 0350   norepinephrine (LEVOPHED) Adult infusion     PRN Meds:.docusate sodium, ipratropium-albuterol, polyethylene glycol   Active Hospital Problem list     Assessment & Plan:  #Sepsis with Septic Shock # Multifocal vs Aspiration Pneumonia and ?UTI Initial interventions/workup included: 3 L of NS/LR   & Cefepime/ Vancomycin/ Metronidazole meets SIRS criteria: Heart Rate 131 beats/minute, Respiratory Rate 30 breaths/minute,Temperature 103.2, Shock Index (SI):3 -Supplemental oxygen as needed, to maintain SpO2 > 90% -F/u cultures, trend lactic/ PCT -Monitor WBC/ fever curve -IV antibiotics: Start Zosyn will add Vanc pending MRSA -IVF hydration as needed -Pressors for MAP goal >65 -Strict I/O's  # AKI, likely prerenal in the setting of severe dehydration/Hypotension # Hyponatremia # Hypokalemia Baseline creatinine 1.03, now 1.45 in setting of poor PO intake/ dehydration.  -Strict I/O's: GoalUOP < 0.5 mL/kg/hr -Follow BMP -Check TSH and Free T4 -Ensure adequate renal perfusion -Avoid nephrotoxic agents as able -Replace electrolytes as indicated  # Diarrhea  # unresectable ascending colon mass as well as cholelithiasis s/p HALS right colectomy and cholecystectomy 02/10/23 CT abdomen/pelvis as above -Keep NPO -NGT for decompression -Check GI Panel with Cdiff -IVFs -Abx as above -Surgery consult for follow up   # Hx of Seizure Disorder ? Syncope unclear if related to seizure or above issues -Npo -Check Dilantin, Tegretol and phenobarb levels -Continue home meds -Seizures precaution    Best practice:  Diet:  NPO Pain/Anxiety/Delirium protocol (if indicated): No VAP protocol (if indicated): Not indicated DVT prophylaxis: Subcutaneous Heparin GI prophylaxis: N/A Glucose control:  SSI Yes Central venous access:  N/A Arterial line:  N/A Foley:  N/A Mobility:  bed rest  PT consulted: N/A Last date of multidisciplinary goals of care discussion [4/20] Code Status:  full code Disposition: ICU   = Goals of Care = Code Status Order: FULL  Primary Emergency Contact: GENARO, BEKKER, Home Phone: [phone] Wishes to pursue full aggressive treatment and intervention options, including CPR and intubation, but goals of care will be addressed on going with family if that should  become necessary.   Critical care time: 45 minutes        Webb Silversmith DNP, CCRN, FNP-C, AGACNP-BC Acute Care & Family Nurse Practitioner [facility] PCCM on call pager [phone]

## 2023-02-19 NOTE — Progress Notes (Signed)
PHARMACY -  BRIEF ANTIBIOTIC NOTE   Pharmacy has received consult(s) for Vancomycin from an ED provider.  The patient's profile has been reviewed for ht/wt/allergies/indication/available labs.    One time order(s) placed for Vancomycin 1750 mg per pt wt: 71.7 kg  Further antibiotics/pharmacy consults should be ordered by admitting physician if indicated.                       Thank you, Otelia Sergeant, PharmD, [facility] 02/19/2023 2:19 AM

## 2023-02-20 ENCOUNTER — Inpatient Hospital Stay: Payer: 59

## 2023-02-20 DIAGNOSIS — K56609 Unspecified intestinal obstruction, unspecified as to partial versus complete obstruction: Secondary | ICD-10-CM | POA: Diagnosis not present

## 2023-02-20 DIAGNOSIS — J189 Pneumonia, unspecified organism: Secondary | ICD-10-CM | POA: Diagnosis present

## 2023-02-20 LAB — GASTROINTESTINAL PANEL BY PCR, STOOL (REPLACES STOOL CULTURE)

## 2023-02-20 LAB — BASIC METABOLIC PANEL
Anion gap: 10 (ref 5–15)
BUN: 22 mg/dL — ABNORMAL HIGH (ref 6–20)
CO2: 24 mmol/L (ref 22–32)
Calcium: 7.8 mg/dL — ABNORMAL LOW (ref 8.9–10.3)
Chloride: 95 mmol/L — ABNORMAL LOW (ref 98–111)
Creatinine, Ser: 0.99 mg/dL (ref 0.61–1.24)
GFR, Estimated: >60 mL/min (ref >60)
Glucose, Bld: 121 mg/dL — ABNORMAL HIGH (ref 70–99)
Potassium: 3.1 mmol/L — ABNORMAL LOW (ref 3.5–5.1)
Sodium: 129 mmol/L — ABNORMAL LOW (ref 135–145)

## 2023-02-20 LAB — CBC
HCT: 28.2 % — ABNORMAL LOW (ref 39.0–52.0)
Hemoglobin: 10 g/dL — ABNORMAL LOW (ref 13.0–17.0)
MCH: 29.6 pg (ref 26.0–34.0)
MCHC: 35.5 g/dL (ref 30.0–36.0)
MCV: 83.4 fL (ref 80.0–100.0)
Platelets: 191 10*3/uL (ref 150–400)
RBC: 3.38 MIL/uL — ABNORMAL LOW (ref 4.22–5.81)
RDW: 12.7 % (ref 11.5–15.5)
WBC: 22.5 10*3/uL — ABNORMAL HIGH (ref 4.0–10.5)
nRBC: 0 % (ref 0.0–0.2)

## 2023-02-20 LAB — URINE CULTURE: Culture: NO GROWTH

## 2023-02-20 LAB — C DIFFICILE QUICK SCREEN W PCR REFLEX
C Diff antigen: NEGATIVE
C Diff interpretation: NOT DETECTED
C Diff toxin: NEGATIVE

## 2023-02-20 LAB — PHOSPHORUS: Phosphorus: 2.8 mg/dL (ref 2.5–4.6)

## 2023-02-20 LAB — MAGNESIUM: Magnesium: 2 mg/dL (ref 1.7–2.4)

## 2023-02-20 MED ORDER — POTASSIUM CHLORIDE 10 MEQ/100ML IV SOLN
10.0000 meq | INTRAVENOUS | Status: AC
  Administered 2023-02-20 (×4): 10 meq via INTRAVENOUS
  Filled 2023-02-20 (×4): qty 100

## 2023-02-20 MED ORDER — POTASSIUM CHLORIDE IN NACL 20-0.9 MEQ/L-% IV SOLN
INTRAVENOUS | Status: AC
  Filled 2023-02-20 (×4): qty 1000

## 2023-02-20 MED ORDER — ORAL CARE MOUTH RINSE
15.0000 mL | OROMUCOSAL | Status: DC
  Administered 2023-02-20 – 2023-03-03 (×39): 15 mL via OROMUCOSAL

## 2023-02-20 MED ORDER — ORAL CARE MOUTH RINSE: 15.0000 mL | OROMUCOSAL | Status: DC | PRN

## 2023-02-20 NOTE — Progress Notes (Signed)
PHARMACY CONSULT NOTE  Pharmacy Consult for Electrolyte Monitoring and Replacement   Recent Labs: Potassium (mmol/L)  Date Value  02/20/2023 3.1 (L)   Magnesium (mg/dL)  Date Value  16/08/9603 2.0   Calcium (mg/dL)  Date Value  54/07/8118 7.8 (L)   Albumin (g/dL)  Date Value  14/78/2956 3.1 (L)  07/27/2016 4.7   Phosphorus (mg/dL)  Date Value  21/30/8657 2.8   Sodium (mmol/L)  Date Value  02/20/2023 129 (L)  07/27/2016 133 (L)    Assessment: 52 y.o. male w/ PMH of seizure disorder, IDA, HLD, bilateral Inguinal hernia, ascending colonic mass with tubular adenoma with high grade dysplasia, recent right hemicolectomy admitted on 02/19/2023 with sepsis. Pharmacy is asked to follow and replace electrolytes while in CCU.  Goal of Therapy:  Electrolytes WNL  Plan:  ---10 mEq IC KCl x 4  ---recheck electrolytes in am  Lowella Bandy ,PharmD Clinical Pharmacist 02/20/2023 7:18 AM

## 2023-02-20 NOTE — Progress Notes (Signed)
NAME:  Alec Campbell, MRN:  [id number], DOB:  August 22, 1971, LOS: 1 ADMISSION DATE:  02/19/2023, CONSULTATION DATE:  02/19/23 REFERRING MD:  Rochele Raring CHIEF COMPLAINT:  Diarrhea, syncope, N/V    HPI  52 y.o male with significant PMH of mild mental retardation/developmental delay, seizure disorder, IDA, HLD,  bilateral Inguinal hernia, ascending colonic mass with tubular adenoma with high grade dysplasia, recent right hemicolectomy with stapled ileocolostomy and Laparoscopic cholecystectomy who presented to the ED with chief complaints of  diarrhea, nausea, vomiting, hiccups, fevers and chills x 7 days.  Patient underwent uncomplicated right-sided colectomy for an ascending colon mass and cholecystectomy for cholelithiasis and chronic cholecystitis by Dr. Everlene Farrier on 02/10/2023.  He was discharge don 02/11/2023 in stable condition. Per pt's mother, patient developed nausea, vomiting and diarrhea on Saturday, April 13.  Was seen by Dr. Everlene Farrier in the office on 02/16/2023 and 2 L of fluids were ordered for 02/17/2023.  Patient's mother reported new fever and 2 syncopal episodes and possible myoclonic jerks last night with brief LOC.  Unclear if episode was true seizures or not but per mother "he came back too quickly unlike his true seizure".  He is febrile and hypotensive with EMS    ED Course: Initial vital signs showed HR of beats/minute, BP mm Hg, the RR 30 breaths/minute, and the oxygen saturation % on and a temperature of 98.54F (36.9C).   Pertinent Labs/Diagnostics Findings: Na+/ K+: 125/3.1  Glucose: 206 BUN/Cr.:36/1.145 WBC: Unremarkable  PCT: pending Lactic acid: 4.6 COVID PCR: Negative, Troponin:48   CT Abd/pelvis>multifocal pneumonia. Partial bowel obstruction and ?acute cystitis.  Patient given 30 cc/kg of fluids and started on broad-spectrum antibiotics Vanco cefepime and Flagyl for sepsis with septic shock. Patient remained hypotensive despite IVF boluses therefore was started on Levophed.   (Sepsis reassessment completed). PCCM consulted.  Past Medical History  mild mental retardation/developmental delay, seizure disorder, IDA, HLD,  bilateral Inguinal hernia, ascending colonic mass with tubular adenoma with high grade dysplasia, recent right hemicolectomy with stapled ileocolostomy and Laparoscopic cholecystectomy   Significant Hospital Events   4/20:Admit to ICU with sepsis secondary to multifocal pneumonia and suspected bowel obstruction  Consults:  General Surgery  Procedures:  None  Significant Diagnostic Tests:  4/20: Chest Xray> IMPRESSION: 1. Pulmonary hypoinflation.    4/20: CT Chest abdomen and pelvis> IMPRESSION: 1. Marked severity bilateral lower lobe and mild right middle lobe infiltrate, consistent with multifocal pneumonia. 2. Findings consistent with a partial small bowel obstruction. 3. Evidence of prior cholecystectomy and right colectomy. 4. Bilateral fat containing inguinal hernias. 5. Fluid-filled left scrotal hernia. 6. Mild prostate gland enlargement. 7. Mild diffuse urinary bladder wall thickening which may be, in part, secondary to chronic bladder outlet obstruction. Correlation with urinalysis is recommended to exclude the presence of acute cystitis. 8. Aortic atherosclerosis.  Interim History / Subjective:    Micro Data:  4/20: Blood culture x2> 4/20: Urine Culture> 4/20: MRSA PCR>>  4/20: Strep pneumo urinary antigen> 4/20: Legionella urinary antigen>  Antimicrobials:  Vancomycin 4/20> Cefepime 4/20> Metronidazole 4/20>  OBJECTIVE  Blood pressure (!) 140/72, pulse (!) 111, temperature 98.4 F (36.9 C), temperature source Oral, resp. rate (!) 24, height 5\' 10"  (1.778 m), weight 75.6 kg, SpO2 93 %.        Intake/Output Summary (Last 24 hours) at 02/20/2023 1044 Last data filed at 02/20/2023 0800 Gross per 24 hour  Intake 2853.38 ml  Output 3440 ml  Net -586.62 ml   Filed Weights   02/19/23  1610 02/19/23 0814  02/20/23 0200  Weight: 76.7 kg 75.6 kg 75.6 kg   Physical Examination  GENERAL:  52 year-old critically ill patient lying in the bed in no acute distress EYES: PEERLA. No scleral icterus. Extraocular muscles intact.  HEENT: Head atraumatic, normocephalic. Oropharynx and nasopharynx clear.  NECK:  No JVD, supple  LUNGS: Normal breath sounds bilaterally.  No use of accessory muscles of respiration.  CARDIOVASCULAR: S1, S2 normal. No murmurs, rubs, or gallops.  ABDOMEN: Soft, NTND.healing midline incision EXTREMITIES: No swelling or erythema.  Capillary refill is less than 3 seconds in all extremities. Pulses palpable distally. NEUROLOGIC: The patient is alert and oriented to self.  No focal neurological deficit. Cranial nerves are intact.  SKIN: No obvious rash, lesion, or ulcer. Warm to touch Labs/imaging that I havepersonally reviewed  (right click and "Reselect all SmartList Selections" daily)     Labs   CBC: Recent Labs  Lab 02/19/23 0210 02/20/23 0427  WBC 8.4 22.5*  NEUTROABS 6.8  --   HGB 13.8 10.0*  HCT 39.6 28.2*  MCV 83.9 83.4  PLT 381 191     Basic Metabolic Panel: Recent Labs  Lab 02/19/23 0210 02/20/23 0427  NA 125* 129*  K 3.1* 3.1*  CL 86* 95*  CO2 24 24  GLUCOSE 206* 121*  BUN 36* 22*  CREATININE 1.45* 0.99  CALCIUM 8.2* 7.8*  MG 1.7 2.0  PHOS  --  2.8    GFR: Estimated Creatinine Clearance: 90.1 mL/min (by C-G formula based on SCr of 0.99 mg/dL). Recent Labs  Lab 02/19/23 0210 02/19/23 0630 02/20/23 0427  PROCALCITON 0.22  --   --   WBC 8.4  --  22.5*  LATICACIDVEN 4.6* 2.9*  --      Liver Function Tests: Recent Labs  Lab 02/19/23 0210  AST 33  ALT 21  ALKPHOS 110  BILITOT 1.0  PROT 6.3*  ALBUMIN 3.1*    Recent Labs  Lab 02/19/23 0210  LIPASE 49    No results for input(s): "AMMONIA" in the last 168 hours.  ABG No results found for: "PHART", "PCO2ART", "PO2ART", "HCO3", "TCO2", "ACIDBASEDEF", "O2SAT"   Coagulation  Profile: Recent Labs  Lab 02/19/23 0210  INR 1.5*     Cardiac Enzymes: No results for input(s): "CKTOTAL", "CKMB", "CKMBINDEX", "TROPONINI" in the last 168 hours.  HbA1C: Hgb A1c MFr Bld  Date/Time Value Ref Range Status  09/06/2017 11:49 AM 5.2 <5.7 % of total Hgb Final    Comment:    For the purpose of screening for the presence of diabetes: . <5.7%       Consistent with the absence of diabetes 5.7-6.4%    Consistent with increased risk for diabetes             (prediabetes) > or =6.5%  Consistent with diabetes . This assay result is consistent with a decreased risk of diabetes. . Currently, no consensus exists regarding use of hemoglobin A1c for diagnosis of diabetes in children. . According to American Diabetes Association (ADA) guidelines, hemoglobin A1c <7.0% represents optimal control in non-pregnant diabetic patients. Different metrics may apply to specific patient populations.  Standards of Medical Care in Diabetes(ADA). Marland Kitchen   01/19/2017 09:25 AM 5.0 <5.7 % Final    Comment:      For the purpose of screening for the presence of diabetes:   <5.7%       Consistent with the absence of diabetes 5.7-6.4 %   Consistent with increased risk for diabetes (  prediabetes) >=6.5 %     Consistent with diabetes   This assay result is consistent with a decreased risk of diabetes.   Currently, no consensus exists regarding use of hemoglobin A1c for diagnosis of diabetes in children.   According to American Diabetes Association (ADA) guidelines, hemoglobin A1c <7.0% represents optimal control in non-pregnant diabetic patients. Different metrics may apply to specific patient populations. Standards of Medical Care in Diabetes (ADA).       CBG: No results for input(s): "GLUCAP" in the last 168 hours.  Review of Systems:   UNABLE TO OBTAIN DUE TO DEVELOPMENTAL DELAY  Past Medical History  He,  has a past medical history of Allergy, Moderate mental retardation,  Seizures, and Vitamin D deficiency.   Surgical History    Past Surgical History:  Procedure Laterality Date   ANKLE SURGERY Right    CHOLECYSTECTOMY N/A 02/10/2023   Procedure: LAPAROSCOPIC CHOLECYSTECTOMY;  Surgeon: Leafy Ro, MD;  Location: [facility] ORS;  Service: General;  Laterality: N/A;   CLEFT LIP REPAIR     COLONOSCOPY N/A 12/30/2022   Procedure: COLONOSCOPY;  Surgeon: Wyline Mood, MD;  Location: [facility] ENDOSCOPY;  Service: Gastroenterology;  Laterality: N/A;  SPECIAL NEEDS - MOTHER NEEDS TO ACCOMPANY HIM.   ESOPHAGOGASTRODUODENOSCOPY (EGD) WITH PROPOFOL N/A 12/30/2022   Procedure: ESOPHAGOGASTRODUODENOSCOPY (EGD) WITH PROPOFOL;  Surgeon: Wyline Mood, MD;  Location: [facility];  Service: Gastroenterology;  Laterality: N/A;   HERNIA REPAIR     Inguinal   LAPAROSCOPIC RIGHT COLECTOMY N/A 02/10/2023   Procedure: LAPAROSCOPIC RIGHT COLECTOMY, hand assisted, RNFA to assist;  Surgeon: Leafy Ro, MD;  Location: [facility] ORS;  Service: General;  Laterality: N/A;   PALATE SURGERY     TYMPANOSTOMY TUBE PLACEMENT       Social History   reports that he has never smoked. He has never been exposed to tobacco smoke. He has never used smokeless tobacco. He reports that he does not drink alcohol and does not use drugs.   Family History   His family history includes Alcohol abuse in his brother; Aneurysm in his brother; CAD in his father; COPD in his brother; Diabetes in his brother; Emphysema in his brother; Healthy in his brother and sister; Hyperlipidemia in his father; Hypertension in his brother, father, and mother; Thyroid disease in his mother.   Allergies No Known Allergies   Home Medications  Prior to Admission medications   Medication Sig Start Date End Date Taking? Authorizing Provider  carbamazepine (TEGRETOL) 200 MG tablet TAKE 2 TABLETS BY MOUTH IN THE MORNING, THEN TAKE  1 TABLET AT LUNCH AND TAKE 1 AT NIGHT 12/10/22   Alba Cory, MD  ibuprofen (ADVIL) 600 MG tablet Take  1 tablet (600 mg total) by mouth every 6 (six) hours as needed. 02/11/23   Donovan Kail, PA-C  loperamide (IMODIUM A-D) 2 MG tablet Take 1 tablet (2 mg total) by mouth 3 (three) times daily as needed for diarrhea or loose stools. 02/15/23 03/17/23  Donovan Kail, PA-C  loratadine (CLARITIN) 10 MG tablet Take 1 tablet (10 mg total) by mouth daily. Patient taking differently: Take 10 mg by mouth daily as needed for allergies. 01/22/19   Alba Cory, MD  Multiple Vitamin (MULTIVITAMIN) tablet Take 1 tablet by mouth daily.    [provider]  ondansetron (ZOFRAN-ODT) 4 MG disintegrating tablet Take 1 tablet (4 mg total) by mouth every 6 (six) hours as needed for nausea. 02/11/23   Donovan Kail, PA-C  oxyCODONE (OXY  IR/ROXICODONE) 5 MG immediate release tablet Take 1 tablet (5 mg total) by mouth every 6 (six) hours as needed for severe pain or breakthrough pain. 02/11/23   Donovan Kail, PA-C  PHENobarbital (LUMINAL) 64.8 MG tablet Take 1 tablet (64.8 mg total) by mouth 2 (two) times daily. 12/10/22   Alba Cory, MD  PREVIDENT 5000 BOOSTER PLUS 1.1 % PSTE See admin instructions. 01/21/23   [provider]  PREVIDENT 5000 SENSITIVE 1.1-5 % GEL Place onto teeth 2 (two) times daily. 05/11/21   [provider]  rosuvastatin (CRESTOR) 10 MG tablet Take 1 tablet (10 mg total) by mouth daily. 12/10/22   Alba Cory, MD  Sodium Sulfate-Mag Sulfate-KCl (SUTAB) [phone] MG TABS At 5 PM take 12 tablets using the 8 oz cup provided in the kit drinking 5 cups of water and 5 hours before your procedure repeat the same process. 01/27/23   Wyline Mood, MD  valACYclovir (VALTREX) 1000 MG tablet TAKE 1 TABLET BY MOUTH TWICE DAILY AS NEEDED FOR  OUTBREAK 08/29/22   Alba Cory, MD  Scheduled Meds:  Chlorhexidine Gluconate Cloth  6 each Topical Q0600   heparin  5,000 Units Subcutaneous Q8H   mouth rinse  15 mL Mouth Rinse 4 times per day   Continuous Infusions:  sodium  chloride Stopped (02/19/23 0755)   levETIRAcetam 432 mL/hr at 02/19/23 2346   piperacillin-tazobactam (ZOSYN)  IV 12.5 mL/hr at 02/20/23 0800   potassium chloride 10 mEq (02/20/23 1031)   PRN Meds:.docusate sodium, ipratropium-albuterol, morphine injection, mouth rinse, polyethylene glycol   Active Hospital Problem list     Assessment & Plan:  #Sepsis with Septic Shock- RESOLVED  # Multifocal vs Aspiration Pneumonia and ?UTI Initial interventions/workup included: 3 L of NS/LR  & Cefepime/ Vancomycin/ Metronidazole meets SIRS criteria: Heart Rate 131 beats/minute, Respiratory Rate 30 breaths/minute,Temperature 103.2, Shock Index (SI):3 -Supplemental oxygen as needed, to maintain SpO2 > 90% -F/u cultures, trend lactic/ PCT -Monitor WBC/ fever curve -IV antibiotics: Start Zosyn will add Vanc pending MRSA -IVF hydration as needed -Pressors for MAP goal >65 -Strict I/O's   # AKI, likely prerenal in the setting of severe dehydration/Hypotension # Hyponatremia # Hypokalemia Baseline creatinine 1.03, now 1.45 in setting of poor PO intake/ dehydration.  -Strict I/O's: GoalUOP < 0.5 mL/kg/hr -Follow BMP -Check TSH and Free T4 -Ensure adequate renal perfusion -Avoid nephrotoxic agents as able -Replace electrolytes as indicated  # Diarrhea -cdiff negative, Stool viral panel negative,  stool ova and parasites ordered, stool culture ordered           - no need for rectal tube due to cognitive impairment patient will likely remove it and traumatize rectum  # unresectable ascending colon mass as well as cholelithiasis s/p HALS right colectomy and cholecystectomy 02/10/23 CT abdomen/pelvis as above -Keep NPO -NGT for decompression -Check GI Panel with Cdiff -IVFs -Abx as above -Surgery consult for follow up   # Hx of Seizure Disorder ? Syncope unclear if related to seizure or above issues -Npo -Check Dilantin, Tegretol and phenobarb levels -Continue home meds -Seizures precaution     Best practice:  Diet:  NPO Pain/Anxiety/Delirium protocol (if indicated): No VAP protocol (if indicated): Not indicated DVT prophylaxis: Subcutaneous Heparin GI prophylaxis: N/A Glucose control:  SSI Yes Central venous access:  N/A Arterial line:  N/A Foley:  N/A Mobility:  bed rest  PT consulted: N/A Last date of multidisciplinary goals of care discussion [4/20] Code Status:  full code Disposition: ICU   =  Goals of Care = Code Status Order: FULL  Primary Emergency Contact: AVEDIS, BEVIS, Home Phone: [phone] Wishes to pursue full aggressive treatment and intervention options, including CPR and intubation, but goals of care will be addressed on going with family if that should become necessary.   Critical care provider statement:   Total critical care time: 33 minutes   Performed by: Karna Christmas MD   Critical care time was exclusive of separately billable procedures and treating other patients.   Critical care was necessary to treat or prevent imminent or life-threatening deterioration.   Critical care was time spent personally by me on the following activities: development of treatment plan with patient and/or surrogate as well as nursing, discussions with consultants, evaluation of patient's response to treatment, examination of patient, obtaining history from patient or surrogate, ordering and performing treatments and interventions, ordering and review of laboratory studies, ordering and review of radiographic studies, pulse oximetry and re-evaluation of patient's condition.    Vida Rigger, M.D.  Pulmonary & Critical Care Medicine

## 2023-02-20 NOTE — Progress Notes (Signed)
02/20/2023  Subjective: Patient pulled his NG tube last night but replaced again without complications.  NG output has been high of more than 2 L over 24 hours.  However he has also had bouts of diarrhea.  Stool studies were obtained yesterday and both GI panel and C. difficile were negative.  White blood cell count is elevated today to 22.5, likely a reflection of his pneumonia.  Electrolytes still off but his creatinine is much improved.  Vital signs: Temp:  [98.3 F (36.8 C)-98.8 F (37.1 C)] 98.4 F (36.9 C) (04/21 0800) Pulse Rate:  [97-111] 106 (04/21 1100) Resp:  [16-29] 19 (04/21 1100) BP: (99-158)/(57-113) 158/66 (04/21 1100) SpO2:  [93 %-100 %] 95 % (04/21 1100) Weight:  [75.6 kg] 75.6 kg (04/21 0200)   Intake/Output: 04/20 0701 - 04/21 0700 In: 2829.2 [I.V.:2221.4; IV Piggyback:607.9] Out: 3590 [Urine:1240; Emesis/NG output:2350] Last BM Date : 02/20/23  Physical Exam: Constitutional: No acute distress Abdomen: Soft, nondistended, nontender to palpation.  Midline incision is clean, dry, intact.  NG tube is in place with bilious fluid in canister.  Labs:  Recent Labs    02/19/23 0210 02/20/23 0427  WBC 8.4 22.5*  HGB 13.8 10.0*  HCT 39.6 28.2*  PLT 381 191   Recent Labs    02/19/23 0210 02/20/23 0427  NA 125* 129*  K 3.1* 3.1*  CL 86* 95*  CO2 24 24  GLUCOSE 206* 121*  BUN 36* 22*  CREATININE 1.45* 0.99  CALCIUM 8.2* 7.8*   Recent Labs    02/19/23 0210  LABPROT 17.5*  INR 1.5*    Imaging: DG Abd 1 View  Result Date: 02/20/2023 CLINICAL DATA:  [id number] SBO (small bowel obstruction) [id number] EXAM: ABDOMEN - 1 VIEW COMPARISON:  the previous day's study FINDINGS: Gastric tube has been advanced into the decompressed stomach. Some decrease in dilatation of distended bowel loops in the right and lower abdomen since previous exam. Right abdominal surgical clips stable. Regional bones unremarkable. IMPRESSION: Gastric tube advanced into the decompressed stomach,  with partial decompression of bowel. Electronically Signed   By: Corlis Leak M.D.   On: 02/20/2023 09:31    Assessment/Plan: This is a 52 y.o. male with SBO versus diarrheal illness  -Discussed with the patient's mother that currently the NG output has remained high and although he is having bouts of diarrhea, his intestines still appear distended on KUB today.  There has been improvement however but still remains distended.  For now, would recommend continuing NG tube to suction, n.p.o., IV fluid hydration. - Will repeat KUB tomorrow to further evaluate.   I spent 35 minutes dedicated to the care of this patient on the date of this encounter to include pre-visit review of records, face-to-face time with the patient discussing diagnosis and management, and any post-visit coordination of care.  Howie Ill, MD [facility]

## 2023-02-21 ENCOUNTER — Inpatient Hospital Stay: Payer: 59

## 2023-02-21 ENCOUNTER — Other Ambulatory Visit: Payer: Self-pay

## 2023-02-21 ENCOUNTER — Telehealth: Payer: Self-pay | Admitting: Family Medicine

## 2023-02-21 DIAGNOSIS — A419 Sepsis, unspecified organism: Secondary | ICD-10-CM | POA: Diagnosis not present

## 2023-02-21 DIAGNOSIS — E876 Hypokalemia: Secondary | ICD-10-CM | POA: Diagnosis present

## 2023-02-21 DIAGNOSIS — R6521 Severe sepsis with septic shock: Secondary | ICD-10-CM

## 2023-02-21 DIAGNOSIS — N179 Acute kidney failure, unspecified: Secondary | ICD-10-CM | POA: Diagnosis present

## 2023-02-21 DIAGNOSIS — R197 Diarrhea, unspecified: Secondary | ICD-10-CM | POA: Diagnosis present

## 2023-02-21 LAB — CULTURE, BLOOD (ROUTINE X 2)

## 2023-02-21 LAB — BASIC METABOLIC PANEL
Anion gap: 8 (ref 5–15)
BUN: 18 mg/dL (ref 6–20)
CO2: 28 mmol/L (ref 22–32)
Calcium: 7.8 mg/dL — ABNORMAL LOW (ref 8.9–10.3)
Chloride: 97 mmol/L — ABNORMAL LOW (ref 98–111)
Creatinine, Ser: 0.8 mg/dL (ref 0.61–1.24)
GFR, Estimated: >60 mL/min (ref >60)
Glucose, Bld: 133 mg/dL — ABNORMAL HIGH (ref 70–99)
Potassium: 3.1 mmol/L — ABNORMAL LOW (ref 3.5–5.1)
Sodium: 133 mmol/L — ABNORMAL LOW (ref 135–145)

## 2023-02-21 LAB — CBC
HCT: 29.7 % — ABNORMAL LOW (ref 39.0–52.0)
Hemoglobin: 10.1 g/dL — ABNORMAL LOW (ref 13.0–17.0)
MCH: 29 pg (ref 26.0–34.0)
MCHC: 34 g/dL (ref 30.0–36.0)
MCV: 85.3 fL (ref 80.0–100.0)
Platelets: 195 10*3/uL (ref 150–400)
RBC: 3.48 MIL/uL — ABNORMAL LOW (ref 4.22–5.81)
RDW: 12.7 % (ref 11.5–15.5)
WBC: 16.4 10*3/uL — ABNORMAL HIGH (ref 4.0–10.5)
nRBC: 0 % (ref 0.0–0.2)

## 2023-02-21 LAB — GLUCOSE, CAPILLARY
Glucose-Capillary: 120 mg/dL — ABNORMAL HIGH (ref 70–99)
Glucose-Capillary: 153 mg/dL — ABNORMAL HIGH (ref 70–99)

## 2023-02-21 LAB — MAGNESIUM: Magnesium: 2.1 mg/dL (ref 1.7–2.4)

## 2023-02-21 MED ORDER — SODIUM CHLORIDE 0.9% FLUSH
10.0000 mL | Freq: Two times a day (BID) | INTRAVENOUS | Status: DC
  Administered 2023-02-21 – 2023-03-02 (×16): 10 mL
  Administered 2023-03-03: 20 mL

## 2023-02-21 MED ORDER — CHLORHEXIDINE GLUCONATE CLOTH 2 % EX PADS
6.0000 | MEDICATED_PAD | Freq: Every day | CUTANEOUS | Status: DC
  Administered 2023-02-21 – 2023-03-03 (×11): 6 via TOPICAL

## 2023-02-21 MED ORDER — DIAZEPAM 5 MG/ML IJ SOLN
2.5000 mg | Freq: Three times a day (TID) | INTRAMUSCULAR | Status: DC | PRN
  Administered 2023-02-21: 2.5 mg via INTRAVENOUS
  Filled 2023-02-21: qty 2

## 2023-02-21 MED ORDER — DIAZEPAM 5 MG/ML IJ SOLN
5.0000 mg | Freq: Four times a day (QID) | INTRAMUSCULAR | Status: DC | PRN
  Administered 2023-02-21 – 2023-02-22 (×3): 5 mg via INTRAVENOUS
  Filled 2023-02-21 (×3): qty 2

## 2023-02-21 MED ORDER — SODIUM CHLORIDE 0.9% FLUSH: 10.0000 mL | INTRAVENOUS | Status: DC | PRN

## 2023-02-21 MED ORDER — POTASSIUM CHLORIDE IN NACL 20-0.9 MEQ/L-% IV SOLN
INTRAVENOUS | Status: AC
  Filled 2023-02-21: qty 1000

## 2023-02-21 MED ORDER — POTASSIUM CHLORIDE 10 MEQ/100ML IV SOLN
10.0000 meq | INTRAVENOUS | Status: AC
  Administered 2023-02-21 (×3): 10 meq via INTRAVENOUS
  Filled 2023-02-21 (×2): qty 100

## 2023-02-21 MED ORDER — INSULIN ASPART 100 UNIT/ML IJ SOLN
0.0000 [IU] | Freq: Four times a day (QID) | INTRAMUSCULAR | Status: DC
  Administered 2023-02-22: 1 [IU] via SUBCUTANEOUS
  Administered 2023-02-22 – 2023-02-23 (×3): 2 [IU] via SUBCUTANEOUS
  Administered 2023-02-23 – 2023-02-24 (×2): 1 [IU] via SUBCUTANEOUS
  Filled 2023-02-21 (×8): qty 1

## 2023-02-21 MED ORDER — TRAVASOL 10 % IV SOLN
INTRAVENOUS | Status: AC
  Filled 2023-02-21: qty 561.6

## 2023-02-21 NOTE — Assessment & Plan Note (Signed)
POA with SIRS criteria: Heart Rate 131 beats/minute, Respiratory Rate 30 breaths/minute,Temperature 103.2, lactic acid 4.6.  Admitted to ICU requiring vasopressors. --Septic shock & sepsis physiology improved --Trend lactic acid, fever curve, CBC, hemodynamics

## 2023-02-21 NOTE — Consult Note (Signed)
   [facility] CM Inpatient Consult   02/21/2023  Alec Campbell 09-29-1971 [id number]   Location: [facility] Liaison screened pt [facility]).   [facility] Customer service manager [employer]) Accountable Care Organization [ACO] Patient: Alec Campbell)    Primary Care Provider:  Alba Cory, MD [facility]   Patient screened for 30 days readmission hospitalization with noted low risk score for unplanned readmission risk with 2 IP in 6 months. [facility]/Population Health RN liaison will assess for potential [facility]) Care Management service needs with no needs found at this time. DPR father Fredrik Cove) was given an appointment reminder card with an introduction to [facility] services.  Pt noted to have cognitive impairment in the care of his parents as primary caregivers.   Plan:   Anticipate [facility] TOC will follow up post hospital discharge.   [facility] Care Management/Population Health does not replace or interfere with any arrangements made by the Inpatient Transition of Care team.   For questions contact:   Elliot Cousin, RN, BSN [facility] Liaison [place]   [facility]  Population Health Office Hours MTWF 8:00 am to 6 pm off on Thursday [phone] mobile [phone] [Office toll free line][facility] Office Hours are M-F 8:30 - 5 pm 24 hour nurse advise line [phone] Conceirge  [email or web address]

## 2023-02-21 NOTE — Assessment & Plan Note (Signed)
Additional support from family and staff as needed. Agitation - resuming phenobarbital by IV, changing Keppra >> Depakote IV, hope for improvement. Qtc is normal.  IV Haldol PRN if agitated despite above changes.  Behaviors do not respond to IV Valium.

## 2023-02-21 NOTE — Assessment & Plan Note (Signed)
In the setting of recent right hemicolectomy and cholecystectomy. --Mgmt per General Surgery --NG tube in place to wall suction --NPO + maintenance IV fluids --Plan for PICC line placement and TPN --Pain control PRN --IV Valium PRN for restlessness / agitation / anxiety -- to keep lines/tubes in place

## 2023-02-21 NOTE — Assessment & Plan Note (Signed)
Vs Aspiration PNA and ? UTI Complicated by Septic Shock which has resolved.  Initially treated with IV Vanc / Cefepime / Flagyl --Now on IV Zosyn - continue --Maintenance IV hydration  --Trend lactate, procal, fever curve, CBC, hemodynamics

## 2023-02-21 NOTE — Progress Notes (Signed)
Progress Note   Patient: Alec Campbell WRU:[id number] DOB: 1971/09/16 DOA: 02/19/2023     2 DOS: the patient was seen and examined on 02/21/2023   Brief hospital course: "52 y.o male with significant PMH of mild mental retardation/developmental delay, seizure disorder, IDA, HLD,  bilateral Inguinal hernia, ascending colonic mass with tubular adenoma with high grade dysplasia, recent right hemicolectomy with stapled ileocolostomy and Laparoscopic cholecystectomy who presented to the ED with chief complaints of  diarrhea, nausea, vomiting, hiccups, fevers and chills x 7 days.   Patient underwent uncomplicated right-sided colectomy for an ascending colon mass and cholecystectomy for cholelithiasis and chronic cholecystitis by Dr. Everlene Farrier on 02/10/2023.  He was discharge don 02/11/2023 in stable condition. Per pt's mother, patient developed nausea, vomiting and diarrhea on Saturday, April 13.  Was seen by Dr. Everlene Farrier in the office on 02/16/2023 and 2 L of fluids were ordered for 02/17/2023.  Patient's mother reported new fever and 2 syncopal episodes and possible myoclonic jerks last night with brief LOC.  Unclear if episode was true seizures or not but per mother "he came back too quickly unlike his true seizure".  He is febrile and hypotensive with EMS    ED Course: Initial vital signs showed HR of beats/minute, BP mm Hg, the RR 30 breaths/minute, and the oxygen saturation % on and a temperature of 98.20F (36.9C).    Pertinent Labs/Diagnostics Findings: Na+/ K+: 125/3.1  Glucose: 206 BUN/Cr.:36/1.145 WBC: Unremarkable  PCT: pending Lactic acid: 4.6 COVID PCR: Negative, Troponin:48   CT Abd/pelvis>multifocal pneumonia. Partial bowel obstruction and ?acute cystitis..."   Pt was treated with broad spectrum IV antibiotics and IV fluids per sepsis protocol and admitted to ICU with septic shock requiring pressors.   General surgery following for SBO. TRH service assumed care 02/21/23. Further hospital course and  management as outlined below.  Assessment and Plan: * Septic shock POA with SIRS criteria: Heart Rate 131 beats/minute, Respiratory Rate 30 breaths/minute,Temperature 103.2, lactic acid 4.6.  Admitted to ICU requiring vasopressors. --Septic shock & sepsis physiology improved --Trend lactic acid, fever curve, CBC, hemodynamics  Diarrhea Negative C diff and GI panel Follow up pending stool culture & ova/parasites IV hydration Monitor electrolytes & replace PRN  Hypokalemia K 3.1 on admission Ongoing IV replacement with IV fluid additive and K riders. Monitor BMP, replace K as needed Check Mg level  AKI (acute kidney injury) Likely pre-renal from volume depletion / dehydration, poor PO intake.   Cr 1.45 on admission, baseline 1.03. --Continue IV fluids --Monitor BMP  Multifocal pneumonia Vs Aspiration PNA and ? UTI Complicated by Septic Shock which has resolved.  Initially treated with IV Vanc / Cefepime / Flagyl --Now on IV Zosyn - continue --Maintenance IV hydration  --Trend lactate, procal, fever curve, CBC, hemodynamics    SBO (small bowel obstruction) In the setting of recent right hemicolectomy and cholecystectomy. --Mgmt per General Surgery --NG tube in place to wall suction --NPO + maintenance IV fluids --Plan for PICC line placement and TPN --Pain control PRN --IV Valium PRN for restlessness / agitation / anxiety -- to keep lines/tubes in place  Colonic mass S/P recent R hemicolectomy. (02/10/23) General surgery following, see SBO  Seizure disorder ? Syncope unclear if related to seizure or above issues -Npo -Seizures precaution -IV Keppra while NPO -resume home regimen when taking PO  Intellectual disability with epilepsy Additional support from family and staff as needed        Subjective: Pt seen with family at bedside, mother  was sleeping.  Per bedside RN, pt has been highly anxious, restless, intermittently agitated and difficult to control /  redirect.  Low dose IV valium earlier this AM had little effect.  Pt having mid/lower abdominal pain.  He is unable to express other specific complaints.   Physical Exam: Vitals:   02/20/23 1553 02/20/23 1959 02/21/23 0432 02/21/23 0744  BP: 125/79 (!) 158/81 (!) 156/88 (!) 167/74  Pulse: 100 100 95 99  Resp: Temp: 98.2 F (36.8 C) 99.1 F (37.3 C) 98.9 F (37.2 C)   TempSrc:  Oral Oral   SpO2: 96% 96% 94% 95%  Weight:      Height:       General exam: awake, alert, no acute distress HEENT: NG tube in place to wall suction, moist mucus membranes, hearing grossly normal  Respiratory system: CTAB, no wheezes, rales or rhonchi, normal respiratory effort. Cardiovascular system: normal S1/S2, tachycardic, regular rhythm Gastrointestinal system: soft, mildly distended, mid/lower tenderness without rebound or guarding, recent surgical scars healing well. Central nervous system:.exam limited by intellectual disability, does not follow commands, grossly non-focal Extremities: moves all, no edema, normal tone Skin: dry, intact, normal temperature Psychiatry: anxious mood, congruent affect, abnormal judgment and insight   Data Reviewed:  Notable labs ---  Na 133, K 3.1, Cl 97, glucose 133, Ca 7.9, WBC improved 16.4k, Hbg stable 10.1   Family Communication: at bedside on rounds  Disposition: Status is: Inpatient Remains inpatient appropriate because: severity of illness as above  Planned Discharge Destination: Home    Time spent: 46 minutes  Author: Pennie Banter, DO 02/21/2023 3:38 PM  For on call review [email or web address].

## 2023-02-21 NOTE — Progress Notes (Signed)
[facility] SURGICAL PROGRESS NOTE  Hospital Day(s): 2.   Interval History:  Patient seen and examined No acute events or new complaints overnight.  Patient still with abdominal distension, complaining of RLQ pain Hiccupping frequently No more diarrhea  Leukocytosis is improving; down to 16.4K Hgb to 10.1 Renal function normal; sCr - 0.80; UO - 1900 ccs Mild hypokalemia to 3.1 NGT output 800 ccs; bilious He continues to have bowel function KUB still with dilated loops in RLQ  Vital signs in last 24 hours: [min-max] current  Temp:  [97.7 F (36.5 C)-99.1 F (37.3 C)] 98.9 F (37.2 C) (04/22 0432) Pulse Rate:  [95-111] 95 (04/22 0432) Resp:  [16-25] 18 (04/22 0432) BP: (125-166)/(66-91) 156/88 (04/22 0432) SpO2:  [93 %-97 %] 94 % (04/22 0432)     Height:  (177.8 cm) Weight: 75.6 kg BMI (Calculated): 23.91   Intake/Output last 2 shifts:  04/21 0701 - 04/22 0700 In: 756.2 [I.V.:178.3; IV Piggyback:577.9] Out: 2700 [Urine:1900; Emesis/NG output:800]   Physical Exam:  Constitutional: alert, cooperative and no distress HEENT: NGT in place; output bilious  Respiratory: breathing non-labored at rest  Cardiovascular: regular rate and sinus rhythm  Gastrointestinal: Soft, distended, he does report RLQ/lower abdominal discomfort, no rebound, he does not appear peritonitic  Integumentary: Laparoscopic incisions are CDI with dermabond, no erythema or drainage. Ecchymosis in RLQ.   Labs:     Latest Ref Rng & Units 02/21/2023    4:43 AM 02/20/2023    4:27 AM 02/19/2023    2:10 AM  CBC  WBC 4.0 - 10.5 K/uL 16.4  22.5  8.4   Hemoglobin 13.0 - 17.0 g/dL 16.1  09.6  04.5   Hematocrit 39.0 - 52.0 % 29.7  28.2  39.6   Platelets 150 - 400 K/uL 195  191  381       Latest Ref Rng & Units 02/21/2023    4:43 AM 02/20/2023    4:27 AM 02/19/2023    2:10 AM  CMP  Glucose 70 - 99 mg/dL 409  811  914   BUN 6 - 20 mg/dL 18  22  36   Creatinine 0.61 - 1.24 mg/dL 7.82   9.56  2.13   Sodium 135 - 145 mmol/L 133  129  125   Potassium 3.5 - 5.1 mmol/L 3.1  3.1  3.1   Chloride 98 - 111 mmol/L 97  95  86   CO2 22 - 32 mmol/L Calcium 8.9 - 10.3 mg/dL 7.8  7.8  8.2   Total Protein 6.5 - 8.1 g/dL   6.3   Total Bilirubin 0.3 - 1.2 mg/dL   1.0   Alkaline Phos 38 - 126 U/L   110   AST 15 - 41 U/L   33   ALT 0 - 44 U/L   21     Imaging studies:   KUB (02/20/2022) personally reviewed which shows dilated loops of small bowel in RLQ; there is gas in colon, no gross pneumoperitoneum, and radiologist report pending....   Assessment/Plan:  52 y.o. male with likely ileus 11 days s/p hand assisted laparoscopic right hemicolectomy and cholecystectomy for right colon cancer and cholelithiasis, complicated by pertinent comorbidities including developmental delay.    - Unfortunately, still without progression of bowel distension with continued high output NGT. I do think at this point he will benefit from PICC placement + TPN; ordered  - Continue NPO for now; okay for a few  ice chips - Continue IVF support - Continue NGT decompression; LIS; monitor and record output - Monitor abdominal examination; on-going bowel function - Serial KUBs; ordered for AM - Monitor leukocytosis; improving - Monitor renal function - Mobilize as tolerated   - Further management per primary service; we will follow    All of the above findings and recommendations were discussed with the patient, patient's family (mother and father at bedside), and the medical team, and all of their questions were answered to their expressed satisfaction.  -- Lynden Oxford, PA-C [facility] 02/21/2023, 7:23 AM M-F: 7am - 4pm

## 2023-02-21 NOTE — Progress Notes (Signed)
Peripherally Inserted Central Catheter Placement  The IV Nurse has discussed with the patient and/or persons authorized to consent for the patient, the purpose of this procedure and the potential benefits and risks involved with this procedure.  The benefits include less needle sticks, lab draws from the catheter, and the patient may be discharged home with the catheter. Risks include, but not limited to, infection, bleeding, blood clot (thrombus formation), and puncture of an artery; nerve damage and irregular heartbeat and possibility to perform a PICC exchange if needed/ordered by physician.  Alternatives to this procedure were also discussed.  Bard Power PICC patient education guide, fact sheet on infection prevention and patient information card has been provided to patient /or left at bedside.    PICC Placement Documentation  PICC Double Lumen 02/21/23 Right Basilic 38 cm 0 cm (Active)  Indication for Insertion or Continuance of Line Administration of hyperosmolar/irritating solutions (i.e. TPN, Vancomycin, etc.) 02/21/23 1800  Exposed Catheter (cm) 0 cm 02/21/23 1800  Site Assessment Clean, Dry, Intact 02/21/23 1800  Lumen #1 Status Flushed;Blood return noted;Saline locked 02/21/23 1800  Lumen #2 Status Flushed;Blood return noted;Saline locked 02/21/23 1800  Dressing Type Transparent 02/21/23 1800  Dressing Status Antimicrobial disc in place 02/21/23 1800  Line Care Connections checked and tightened 02/21/23 1800  Line Adjustment (NICU/IV Team Only) Yes 02/21/23 1800  Dressing Intervention New dressing 02/21/23 1800  Dressing Change Due 02/28/23 02/21/23 1800       Audrie Gallus 02/21/2023, 6:04 PM

## 2023-02-21 NOTE — Assessment & Plan Note (Addendum)
S/P recent R hemicolectomy. (02/10/23) General surgery following, see SBO

## 2023-02-21 NOTE — Assessment & Plan Note (Signed)
Negative C diff and GI panel Follow up pending stool culture & ova/parasites IV hydration Monitor electrolytes & replace PRN

## 2023-02-21 NOTE — Progress Notes (Signed)
Initial Nutrition Assessment  DOCUMENTATION CODES:   Not applicable  INTERVENTION:   TPN per pharmacy  Recommend thiamine  daily in TPN x 3 days  Pt at high refeed risk; recommend monitor potassium, magnesium and phosphorus labs daily until stable  Daily weights  NUTRITION DIAGNOSIS:   Inadequate oral intake related to acute illness as evidenced by NPO status.  GOAL:   Patient will meet greater than or equal to 90% of their needs  MONITOR:   Diet advancement, Labs, Weight trends, I & O's, Skin, Other (Comment) (TPN)  REASON FOR ASSESSMENT:   Consult New TPN/TNA  ASSESSMENT:   52 y/o male with h/o intellectual disability with epilepsy, HLD, H. Pylori, colon mass and cholelithiasis s/p hand assisted laparoscopic right hemicolectomy with stapled ileocolostomy and laparoscopic cholecystectomy 4/11 who is admitted with diarrhea, sepsis, AKI, PNA and possible SBO.  Unable to see pt today as pt getting PICC line at time of RD visit. Per chart, pt with nausea, diarrhea, vomiting and hiccups that started on 4/13. Mother reports pt with poor oral intake since surgery. Pt with abdominal pain, distension and hiccups today. KUB reporting small bowel distention possibly r/t ileus per MD. Pt remains NPO. Diarrhea improved. NGT in place with output. Plan today is for PICC line and TPN. Pt is at high refeed risk. Per chart, pt appears to be down ~3lbs(2%) since his surgery. RD will follow up to obtain nutrition related exam and history.    Medications reviewed and include: heparin, insulin, NaCl w/ KCl /hr, zosyn   Labs reviewed: Na 133(L), K 3.1(L), Mg 2.1wnl P 2.8 wnl- 4/21 Wbc- 16.4(H), Hgb 10.1(L), Hct 29.7(L)  NUTRITION - FOCUSED PHYSICAL EXAM: Unable to perform at this time   Diet Order:   Diet Order             Diet NPO time specified  Diet effective now                  EDUCATION NEEDS:   Not appropriate for education at this time  Skin:  Skin  Assessment: Reviewed RN Assessment (incision abdomen)  Last BM:  4/22- TYPE 7  Height:   Ht Readings from Last 1 Encounters:  02/19/23  (1.778 m)    Weight:   Wt Readings from Last 1 Encounters:  02/20/23 75.6 kg    Ideal Body Weight:  75.45 kg  BMI:  Body mass index is 23.91 kg/m.  Estimated Nutritional Needs:   Kcal:  2200-2500kcal/day  Protein:  110-125g/day  Fluid:  2.3-2.6L/day  Betsey Holiday MS, RD, LDN Please refer to [facility] for RD and/or RD on-call/weekend/after hours pager

## 2023-02-21 NOTE — Assessment & Plan Note (Signed)
RESOLVED with fluids. Likely pre-renal from volume depletion / dehydration, poor PO intake.   Cr 1.45 on admission, baseline 1.03. 4/24: Cr 0.70 --Continue IV fluids --Monitor BMP

## 2023-02-21 NOTE — Assessment & Plan Note (Signed)
?   Syncope unclear if related to seizure or above issues -Npo -Seizures precaution -IV Keppra while NPO -resume home regimen when taking PO

## 2023-02-21 NOTE — Assessment & Plan Note (Signed)
K 3.1 on admission Ongoing IV replacement with IV fluid additive and K riders. Monitor BMP, replace K as needed Check Mg level

## 2023-02-21 NOTE — Progress Notes (Signed)
PHARMACY - TOTAL PARENTERAL NUTRITION CONSULT NOTE   Indication: Prolonged ileus  Patient Measurements: Height:  (177.8 cm) Weight: 75.6 kg (166 lb 10.7 oz) IBW/kg (Calculated) : 73 TPN AdjBW (KG): 76.7 Body mass index is 23.91 kg/m.  Assessment:  52 y.o. male w/ PMH of seizure disorder, IDA, HLD, bilateral Inguinal hernia, ascending colonic mass with tubular adenoma with high grade dysplasia, recent right hemicolectomy admitted on 02/19/2023 with sepsis.   Glucose / Insulin: no insulin requirements this admission Electrolytes: hypokalemia Renal:  Hepatic: LFTs wnl Intake / Output 04/21 0701 - 04/22 0700 In: 756.2 [I.V.:178.3; IV Piggyback:577.9] Out: 2700 [Urine:1900; Emesis/NG output:800]  MIVF: 0.9 % NaCl with KCl 20 mEq/ L at 100 mL/hr GI Imaging:  KUB (02/20/2022) personally reviewed which shows dilated loops of small bowel in RLQ; there is gas in colon, no gross pneumoperitoneum  GI Surgeries / Procedures: s/p hand assisted laparoscopic right hemicolectomy and cholecystectomy   Central access: 02/21/23 (pending) TPN start date: 02/21/23  Nutritional Goals: Goal TPN rate is 90 mL/hr (provides 112 g of protein and 2468 kcals per day)  RD Assessment: 2200-2500kcal/day, 110-125g/day protein, 2.3-2.6L/day    Current Nutrition:  NPO  Plan:  ---Start TPN at 63mL/hr at 1800 ---Electrolytes in TPN (standard): Na 36mEq/L, K 65mEq/L, Ca 58mEq/L, Mg 53mEq/L, and Phos 3mmol/L. Cl:Ac 1:1 ---Add standard MVI, thiamine 100 mg and trace elements to TPN ---Initiate Sensitive q6h SSI and adjust as needed  ---10 mEq IV KCl x 3 ---Reduce MIVF to 55 mL/hr at 1800 Monitor TPN labs on Mon/Thurs, daily until stable  Lowella Bandy 02/21/2023,8:16 AM

## 2023-02-21 NOTE — Progress Notes (Signed)
  Transition of Care (TOC) Screening Note   Patient Details  Name: Alec Campbell Date of Birth: Apr 22, 1971   Transition of Care [facility]) CM/SW Contact:    Chapman Fitch, RN Phone Number: 02/21/2023, 11:58 AM    Transition of Care Department [facility]) has reviewed patient and no TOC needs have been identified at this time. We will continue to monitor patient advancement through interdisciplinary progression rounds. If new patient transition needs arise, please place a TOC consult.

## 2023-02-21 NOTE — Hospital Course (Addendum)
52 y.o male with significant PMH of developmental delay, seizure disorder, IDA, HLD,  bilateral Inguinal hernia, 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   Patient underwent uncomplicated right-sided colectomy for an ascending colon mass and cholecystectomy for cholelithiasis and chronic cholecystitis by Dr. Everlene Farrier on 02/10/2023.  He was discharged on 02/11/2023 in stable condition. Per pt's mother, patient developed nausea, vomiting and diarrhea on Saturday, April 13.  Was seen by Dr. Everlene Farrier in the office on 02/16/2023 and 2 L of fluids were ordered for 02/17/2023.  Patient's mother reported new fever and 2 syncopal episodes and possible myoclonic jerks night prior to arrival in ED, with brief LOC.  Unclear if episode was true seizures or not but per mother "he came back too quickly unlike his true seizure".  He was febrile and hypotensive with EMS    04/20: CT Abd/pelvis (+)multifocal pneumonia. Partial bowel obstruction and ?acute cystitis. Pt was treated with broad spectrum IV antibiotics and IV fluids per sepsis protocol and admitted to ICU with septic shock requiring pressors. General surgery following for SBO. 04/22: TRH service assumed care. 04/23-04/29: improving, completed tx for pneumonia. Was on TPN, toelrating diet as of 05/01. Anticipate d/c home 05/02 if continues to tolerate po     Consultants:  General surgery  Pallitaive care Infectious disease   Procedures: none      ASSESSMENT & PLAN:   Principal Problem:   Septic shock (HCC) Active Problems:   Hyponatremia   Intellectual disability with epilepsy (HCC)   Seizure disorder (HCC)   Colonic mass   SBO (small bowel obstruction) (HCC)   Multifocal pneumonia   AKI (acute kidney injury) (HCC)    Hypokalemia   Diarrhea   Hypophosphatemia  Septic shock secondary to multifocal pneumonia-present on admission, resolved  Septic shock & sepsis physiology improved Patient has completed his pneumonia treatment and antibiotics have been discontinued today after 10 days of therapy with infectious disease input Infectious disease has d/c abx and s/o   Hypophosphatemia Refeeding syndrome Phos 1.4 02/22/2023 IV K-phos was given with improvement was on TPN, now doing well on po diet    Chronic diarrhea Negative C diff and GI panel. From discussion with infectious disease this is not concerning for C. difficile since patient tested negative on presentation. IV hydration --> po  Monitor electrolytes & replace PRN   Hypokalemia-improved K 3.1 on admission monitor potassium levels   AKI (acute kidney injury) RESOLVED with fluids. Likely pre-renal from volume depletion / dehydration, poor PO intake.   IV fluids --> po   Monitor BMP   Multifocal pneumonia Vs Aspiration PNA and ? UTI Acute respiratory failure with hypoxia due to PNA - resolved Complicated by Septic Shock which has resolved.  Initially treated with IV Vanc / Cefepime / Flagyl Has completed Zosyn course   SBO (small bowel obstruction) In the setting of recent right hemicolectomy d/t colonic mass, and cholecystectomy  Mgmt per General Surgery NG tube have been removed Advancement of diet according to surgical recommendation TPN --> po  Pain control PRN   Seizure disorder ? Syncope unclear if related to seizure or above issues Seizures precaution Stop IV Keppra (?causing agitation) Discussed with Neurologist Resumed phenobarbital  Closely monitor levels    Oral herpes Infectious disease following Continue valacyclovir   Intellectual disability  with epilepsy Additional support from family and staff as needed. Agitation -continue phenobarbital  Qtc is normal.   IV Haldol PRN if agitated despite above changes.      Hyponatremia likely secondary to dehydration - stable/baseline Monitor BMP.     DVT prophylaxis: lovenox  Pertinent IV fluids/nutrition: tolerating po, d/c TPN, no continuous iv fluids  Central lines / invasive devices: PICC, will remove tomorrow morning / prior to d/c   Code Status: FULL CODE ACP documentation reviewed: 03/02/23 - none on file   Current Admission Status: inpatient   TOC needs / Dispo plan: home  Barriers to discharge / significant pending items: if tolerating po can d/c home tomorrow

## 2023-02-22 ENCOUNTER — Inpatient Hospital Stay: Payer: 59

## 2023-02-22 DIAGNOSIS — R6521 Severe sepsis with septic shock: Secondary | ICD-10-CM | POA: Diagnosis not present

## 2023-02-22 DIAGNOSIS — A419 Sepsis, unspecified organism: Secondary | ICD-10-CM | POA: Diagnosis not present

## 2023-02-22 LAB — LEGIONELLA PNEUMOPHILA SEROGP 1 UR AG: L. pneumophila Serogp 1 Ur Ag: NEGATIVE

## 2023-02-22 LAB — CBC
HCT: 28.4 % — ABNORMAL LOW (ref 39.0–52.0)
Hemoglobin: 9.8 g/dL — ABNORMAL LOW (ref 13.0–17.0)
MCH: 29.4 pg (ref 26.0–34.0)
MCHC: 34.5 g/dL (ref 30.0–36.0)
MCV: 85.3 fL (ref 80.0–100.0)
Platelets: 188 10*3/uL (ref 150–400)
RBC: 3.33 MIL/uL — ABNORMAL LOW (ref 4.22–5.81)
RDW: 12.7 % (ref 11.5–15.5)
WBC: 11.5 10*3/uL — ABNORMAL HIGH (ref 4.0–10.5)
nRBC: 0 % (ref 0.0–0.2)

## 2023-02-22 LAB — COMPREHENSIVE METABOLIC PANEL
ALT: 28 U/L (ref 0–44)
AST: 37 U/L (ref 15–41)
Albumin: 2.4 g/dL — ABNORMAL LOW (ref 3.5–5.0)
Alkaline Phosphatase: 89 U/L (ref 38–126)
Anion gap: 6 (ref 5–15)
BUN: 17 mg/dL (ref 6–20)
CO2: 26 mmol/L (ref 22–32)
Calcium: 7.8 mg/dL — ABNORMAL LOW (ref 8.9–10.3)
Chloride: 99 mmol/L (ref 98–111)
Creatinine, Ser: 0.7 mg/dL (ref 0.61–1.24)
GFR, Estimated: >60 mL/min (ref >60)
Glucose, Bld: 174 mg/dL — ABNORMAL HIGH (ref 70–99)
Potassium: 3.2 mmol/L — ABNORMAL LOW (ref 3.5–5.1)
Sodium: 131 mmol/L — ABNORMAL LOW (ref 135–145)
Total Bilirubin: 0.5 mg/dL (ref 0.3–1.2)
Total Protein: 5.5 g/dL — ABNORMAL LOW (ref 6.5–8.1)

## 2023-02-22 LAB — GLUCOSE, CAPILLARY
Glucose-Capillary: 142 mg/dL — ABNORMAL HIGH (ref 70–99)
Glucose-Capillary: 156 mg/dL — ABNORMAL HIGH (ref 70–99)
Glucose-Capillary: 157 mg/dL — ABNORMAL HIGH (ref 70–99)
Glucose-Capillary: 164 mg/dL — ABNORMAL HIGH (ref 70–99)

## 2023-02-22 LAB — PHOSPHORUS: Phosphorus: 1.4 mg/dL — ABNORMAL LOW (ref 2.5–4.6)

## 2023-02-22 LAB — CULTURE, BLOOD (ROUTINE X 2)

## 2023-02-22 LAB — MAGNESIUM: Magnesium: 1.8 mg/dL (ref 1.7–2.4)

## 2023-02-22 LAB — TRIGLYCERIDES: Triglycerides: 203 mg/dL — ABNORMAL HIGH (ref <150)

## 2023-02-22 MED ORDER — DIAZEPAM 5 MG/ML IJ SOLN: 5.0000 mg | Freq: Four times a day (QID) | INTRAMUSCULAR | Status: DC | PRN

## 2023-02-22 MED ORDER — TRAVASOL 10 % IV SOLN
INTRAVENOUS | Status: AC
  Filled 2023-02-22: qty 561.6

## 2023-02-22 MED ORDER — VALPROATE SODIUM 100 MG/ML IV SOLN
250.0000 mg | Freq: Three times a day (TID) | INTRAVENOUS | Status: DC
  Administered 2023-02-22 – 2023-02-25 (×8): 250 mg via INTRAVENOUS
  Filled 2023-02-22 (×9): qty 2.5

## 2023-02-22 MED ORDER — LABETALOL HCL 5 MG/ML IV SOLN: 10.0000 mg | INTRAVENOUS | Status: DC | PRN

## 2023-02-22 MED ORDER — POTASSIUM PHOSPHATES 15 MMOLE/5ML IV SOLN
30.0000 mmol | Freq: Once | INTRAVENOUS | Status: AC
  Administered 2023-02-22: 30 mmol via INTRAVENOUS
  Filled 2023-02-22: qty 10

## 2023-02-22 MED ORDER — MAGNESIUM SULFATE 2 GM/50ML IV SOLN
2.0000 g | Freq: Once | INTRAVENOUS | Status: AC
  Administered 2023-02-22: 2 g via INTRAVENOUS
  Filled 2023-02-22: qty 50

## 2023-02-22 MED ORDER — HALOPERIDOL LACTATE 5 MG/ML IJ SOLN
2.0000 mg | Freq: Four times a day (QID) | INTRAMUSCULAR | Status: DC | PRN
  Administered 2023-02-22 – 2023-03-01 (×6): 2 mg via INTRAVENOUS
  Filled 2023-02-22 (×6): qty 1

## 2023-02-22 MED ORDER — ENOXAPARIN SODIUM 40 MG/0.4ML IJ SOSY
40.0000 mg | PREFILLED_SYRINGE | Freq: Every day | INTRAMUSCULAR | Status: DC
  Administered 2023-02-22 – 2023-03-01 (×8): 40 mg via SUBCUTANEOUS
  Filled 2023-02-22 (×9): qty 0.4

## 2023-02-22 MED ORDER — PHENOBARBITAL SODIUM 65 MG/ML IJ SOLN
65.0000 mg | Freq: Two times a day (BID) | INTRAMUSCULAR | Status: DC
  Administered 2023-02-22 (×2): 65 mg via INTRAVENOUS
  Filled 2023-02-22 (×4): qty 1

## 2023-02-22 NOTE — Assessment & Plan Note (Signed)
Refeeding syndrome Phos 1.4 this AM IV K-phos replacement ordered Monitor phos daily and replace PRN

## 2023-02-22 NOTE — Progress Notes (Signed)
Patient has been very active this shift. Patient has slept about an hour. Patient has had many loose incontinent bowel movents this shift. Patient is restless in spite of receiving in valium. Update NP [place]oust about patient's current condition. Due to diminished mental capacity and ongoing treatment. Staff requested to have a sitter order in place. Family has been exhausted and need time to rest.

## 2023-02-22 NOTE — Care Management Important Message (Signed)
Important Message  Patient Details  Name: Alec Campbell MRN: [id number] Date of Birth: [birth year]   Medicare Important Message Given:  Yes     [name] 02/22/2023, 11:10 AM

## 2023-02-22 NOTE — Consult Note (Signed)
MEDICATION RELATED CONSULT NOTE  Pharmacy Consult for AED monitoring / phenobarbital and VPA drug-drug interaction Indication: Risk for SUPRAtherapeutic phenobarbital and SUBtherapeutic VPA  Patient Measurements: Height:  (177.8 cm) Weight: 70.3 kg (154 lb 15.7 oz) IBW/kg (Calculated) : 73  Labs: Recent Labs    02/20/23 0427 02/21/23 0443 02/22/23 0551  WBC 22.5* 16.4* 11.5*  HGB 10.0* 10.1* 9.8*  HCT 28.2* 29.7* 28.4*  PLT 191 195 188  CREATININE 0.99 0.80 0.70  MG 2.0 2.1 1.8  PHOS 2.8  --  1.4*  ALBUMIN  --   --  2.4*  PROT  --   --  5.5*  AST  --   --  37  ALT  --   --  28  ALKPHOS  --   --  89  BILITOT  --   --  0.5   Estimated Creatinine Clearance: 107.4 mL/min (by C-G formula based on SCr of 0.7 mg/dL).  Medical History: Past Medical History:  Diagnosis Date   Allergy    Moderate mental retardation    Seizures    Vitamin D deficiency    Medications:   Prior to admission AED regimen: Tegretol 400 mg qAM + 200 mg qLunch + 200 mg qHS Phenobarbital 64.8 mg BID  Inpatient regimen Discontinued: Keppra 750 mg IV q12h (4/20 - 4/23) Phenobarbital 65 mg IV BID (4/23 - ongoing) VPA 250 mg IV q8h (4/23 - ongoing) Valium PRN seizures  Assessment: Patient is a 52 y/o M with medical history including developmental delay, seizure disorder, IDA, HLD, bilateral inguinal hernia, ascending colonic mass with tubular adenoma with high grade dysplasia, recent right hemicolectomy with stapled ileocolostomy and laparoscopic cholecystectomy admitted 4/20 with pnuemonia and possible SBO vs viral illness. NG tube has been placed for decompression. Patient is NPO. TPN has been started. Given patient un-able to tolerate oral medications at this time, Tegretol has not been re-started. Patient was placed on Keppra but is now having issues with severe agitation. Phenobarbital has been re-started. Plan is now to discontinue Keppra due to agitation and start VPA. However, drug-drug  interactions exist with may results in SUPRAtherapeutic levels of phenobarbital and SUBtherapeutic levels of VPA.  Goal of Therapy:  Phenobarbital level 10 - 40 mcg/mL Valproic acid level 50 - 100 mcg/mL  Plan:  --Will check phenobarbital level 4/24 at 0800. Do not anticipate full effect of drug-drug interaction will be elucidated given phenobarbital re-started 4/23 and VPA initiated same day. Will plan to check levels for first several days while on regimen --Will check valproic acid level at Css 4/25 AM --Pharmacy will continue to monitor and alert provider where changes might be indicated  Tressie Ellis 02/22/2023,10:04 AM

## 2023-02-22 NOTE — Assessment & Plan Note (Signed)
Na 133 >> 131 today. Continue IV fluids and TPN Monitor BMP

## 2023-02-22 NOTE — Progress Notes (Signed)
[facility] SURGICAL PROGRESS NOTE  Hospital Day(s): 3.   Interval History:  Patient seen and examined No acute events or new complaints overnight.  Patient still very restless; agitated from time to time Denied any abdominal pain this AM  Leukocytosis is improving; down to 11.5K Hgb to 9.8 Renal function normal; sCr - 0.70; UO - 1900 ccs Mild hypokalemia to 3.2 NGT output 1250 ccs; bilious He continues to have bowel function KUB pending  Vital signs in last 24 hours: [min-max] current  Temp:  [98.2 F (36.8 C)-100.7 F (38.2 C)] 98.2 F (36.8 C) (04/23 0325) Pulse Rate:  [96-101] 101 (04/23 0325) Resp:  [18-19] 19 (04/23 0325) BP: (161-167)/(74-89) 161/89 (04/23 0325) SpO2:  [93 %-95 %] 95 % (04/23 0325) Weight:  [70.3 kg] 70.3 kg (04/23 0144)     Height:  (177.8 cm) Weight: 70.3 kg BMI (Calculated): 22.24   Intake/Output last 2 shifts:  04/22 0701 - 04/23 0700 In: 1765.9 [P.O.:200; I.V.:901.9; NG/GT:50; IV Piggyback:614] Out: 1550 [Urine:300; Emesis/NG output:1250]   Physical Exam:  Constitutional: alert, cooperative and no distress HEENT: NGT in place; output bilious  Respiratory: breathing non-labored at rest  Cardiovascular: regular rate and sinus rhythm  Gastrointestinal: Soft, distended, he does report RLQ/lower abdominal discomfort, no rebound, he does not appear peritonitic  Integumentary: Laparoscopic incisions are CDI with dermabond, no erythema or drainage. Ecchymosis in RLQ.   Labs:     Latest Ref Rng & Units 02/22/2023    5:51 AM 02/21/2023    4:43 AM 02/20/2023    4:27 AM  CBC  WBC 4.0 - 10.5 K/uL 11.5  16.4  22.5   Hemoglobin 13.0 - 17.0 g/dL 9.8  16.1  09.6   Hematocrit 39.0 - 52.0 % 28.4  29.7  28.2   Platelets 150 - 400 K/uL 188  195  191       Latest Ref Rng & Units 02/22/2023    5:51 AM 02/21/2023    4:43 AM 02/20/2023    4:27 AM  CMP  Glucose 70 - 99 mg/dL 045  409  811   BUN 6 - 20 mg/dL Creatinine 0.61  - 1.24 mg/dL 9.14  7.82  9.56   Sodium 135 - 145 mmol/L 131  133  129   Potassium 3.5 - 5.1 mmol/L 3.2  3.1  3.1   Chloride 98 - 111 mmol/L 99  97  95   CO2 22 - 32 mmol/L Calcium 8.9 - 10.3 mg/dL 7.8  7.8  7.8   Total Protein 6.5 - 8.1 g/dL 5.5     Total Bilirubin 0.3 - 1.2 mg/dL 0.5     Alkaline Phos 38 - 126 U/L 89     AST 15 - 41 U/L 37     ALT 0 - 44 U/L 28       Imaging studies:  KUB pending this AM   Assessment/Plan:  52 y.o. male with likely ileus 12 days s/p hand assisted laparoscopic right hemicolectomy and cholecystectomy for right colon cancer and cholelithiasis, complicated by pertinent comorbidities including developmental delay.    - Right now seems agitation and restlessness are biggest barrier, certainly taking a toll on family at bedside. I do think we can get safety sitter (if available), at a minimum in PM. Additionally, may be worthwhile consider strong medication for sedation/relaxation. Will discuss with medicine.    - Continue PICC + TPN;  advance to goal   - Continue NPO for now; okay for a few ice chips - Continue NGT decompression; LIS; monitor and record output - Monitor abdominal examination; on-going bowel function - Serial KUBs; pending this AM - Monitor leukocytosis; improving - Monitor renal function - Mobilize as tolerated   - Further management per primary service; we will follow    All of the above findings and recommendations were discussed with the patient, patient's family (mother and father at bedside), and the medical team, and all of their questions were answered to their expressed satisfaction.  -- Lynden Oxford, PA-C [facility] 02/22/2023, 7:39 AM M-F: 7am - 4pm

## 2023-02-22 NOTE — Progress Notes (Signed)
PHARMACY - TOTAL PARENTERAL NUTRITION CONSULT NOTE   Indication: Prolonged ileus  Patient Measurements: Height:  (177.8 cm) Weight: 70.3 kg (154 lb 15.7 oz) IBW/kg (Calculated) : 73 TPN AdjBW (KG): 76.7 Body mass index is 22.24 kg/m.  Assessment:  52 y.o. male w/ PMH of seizure disorder, IDA, HLD, bilateral Inguinal hernia, ascending colonic mass with tubular adenoma with high grade dysplasia, recent right hemicolectomy admitted on 02/19/2023 with sepsis.   Glucose / Insulin:  --No history of DM --Ordered sensitive SSI q6h --SSI usage last 24h: 2 units --Blood sugar is controlled Electrolytes: Hyponatremia, hypokalemia, hypophosphatemia Renal: Scr < 1, stable Hepatic: LFTs within normal limits. No transaminitis Intake / Output:  --I&O: -2.7L --Only 300 cc UOP 4/22 --1.25L NG tube output on 4/22 --9 bowel movements charted 4/22 --On MIVF with NS + 20 mEq K/L at 55 cc/hr GI Imaging:  KUB (02/20/2022): Shows dilated loops of small bowel in RLQ; there is gas in colon, no gross pneumoperitoneum  GI Surgeries / Procedures: s/p hand assisted laparoscopic right hemicolectomy and cholecystectomy   Central access: 02/21/23 TPN start date: 02/21/23  Nutritional Goals: Goal TPN rate is 90 mL/hr (provides 112 g of protein and 2468 kcals per day)  RD Assessment: Estimated Needs Total Energy Estimated Needs: 2200-2500kcal/day Total Protein Estimated Needs: 110-125g/day Total Fluid Estimated Needs: 2.3-2.6L/day  Current Nutrition:  NPO  Plan:  --Continue TPN at 45 mL/hr (1/2 rate) at 1800 Hold off on advancing today given signs of re-feeding --Electrolytes in TPN (standard): Na 74mEq/L, K 84mEq/L, Ca 49mEq/L, Mg 36mEq/L, and Phos 24mmol/L. Cl:Ac 1:1 Potassium phosphate 30 mmol IV x 1 (~45 mEq K+) ordered per hospitalist Will receive additional 26.4 mEq K+ from maintenance fluids Magnesium sulfate 2 g IV x 1 --Add standard MVI, thiamine, and trace elements to TPN --Continue  Sensitive q6h SSI and adjust as needed  --Continue MIVF to 55 mL/hr at 1800 --Monitor TPN labs on Mon/Thurs at minimum, daily until stable  Tressie Ellis 02/22/2023,8:32 AM

## 2023-02-22 NOTE — Progress Notes (Addendum)
Progress Note   Patient: Alec Campbell ZOX:[id number] DOB: Jul 13, 1971 DOA: 02/19/2023     3 DOS: the patient was seen and examined on 02/22/2023   Brief hospital course: "52 y.o male with significant PMH of mild mental retardation/developmental delay, seizure disorder, IDA, HLD,  bilateral Inguinal hernia, ascending colonic mass with tubular adenoma with high grade dysplasia, recent right hemicolectomy with stapled ileocolostomy and Laparoscopic cholecystectomy who presented to the ED with chief complaints of  diarrhea, nausea, vomiting, hiccups, fevers and chills x 7 days.   Patient underwent uncomplicated right-sided colectomy for an ascending colon mass and cholecystectomy for cholelithiasis and chronic cholecystitis by Dr. Everlene Farrier on 02/10/2023.  He was discharge don 02/11/2023 in stable condition. Per pt's mother, patient developed nausea, vomiting and diarrhea on Saturday, April 13.  Was seen by Dr. Everlene Farrier in the office on 02/16/2023 and 2 L of fluids were ordered for 02/17/2023.  Patient's mother reported new fever and 2 syncopal episodes and possible myoclonic jerks last night with brief LOC.  Unclear if episode was true seizures or not but per mother "he came back too quickly unlike his true seizure".  He is febrile and hypotensive with EMS    ED Course: Initial vital signs showed HR of beats/minute, BP mm Hg, the RR 30 breaths/minute, and the oxygen saturation % on and a temperature of 98.36F (36.9C).    Pertinent Labs/Diagnostics Findings: Na+/ K+: 125/3.1  Glucose: 206 BUN/Cr.:36/1.145 WBC: Unremarkable  PCT: pending Lactic acid: 4.6 COVID PCR: Negative, Troponin:48   CT Abd/pelvis>multifocal pneumonia. Partial bowel obstruction and ?acute cystitis..."   Pt was treated with broad spectrum IV antibiotics and IV fluids per sepsis protocol and admitted to ICU with septic shock requiring pressors.   General surgery following for SBO. TRH service assumed care 02/21/23. Further hospital course and  management as outlined below.  Assessment and Plan: * Septic shock POA with SIRS criteria: Heart Rate 131 beats/minute, Respiratory Rate 30 breaths/minute,Temperature 103.2, lactic acid 4.6.  Admitted to ICU requiring vasopressors. --Septic shock & sepsis physiology improved --Trend lactic acid, fever curve, CBC, hemodynamics  Hypophosphatemia Refeeding syndrome Phos 1.4 this AM IV K-phos replacement ordered Monitor phos daily and replace PRN  Diarrhea Negative C diff and GI panel Follow up pending stool culture & ova/parasites IV hydration Monitor electrolytes & replace PRN  Hypokalemia K 3.1 on admission Ongoing IV replacement with IV fluid additive and K riders. Monitor BMP, replace K as needed Check Mg level  AKI (acute kidney injury) RESOLVED with fluids. Likely pre-renal from volume depletion / dehydration, poor PO intake.   Cr 1.45 on admission, baseline 1.03. 4/24: Cr 0.70 --Continue IV fluids --Monitor BMP  Multifocal pneumonia Vs Aspiration PNA and ? UTI Acute respiratory failure with hypoxia due to PNA - resolved Complicated by Septic Shock which has resolved.  Initially treated with IV Vanc / Cefepime / Flagyl --Now on IV Zosyn - continue --Maintenance IV hydration  --Trend lactate, procal, fever curve, CBC, hemodynamics    SBO (small bowel obstruction) In the setting of recent right hemicolectomy and cholecystectomy. --Mgmt per General Surgery --NG tube in place to wall suction --NPO + maintenance IV fluids --Plan for PICC line placement and TPN --Pain control PRN --IV Valium PRN for restlessness / agitation / anxiety -- to keep lines/tubes in place  Colonic mass S/P recent R hemicolectomy. (02/10/23) General surgery following, see SBO  Seizure disorder ? Syncope unclear if related to seizure or above issues --Seizures precaution --Stop IV Keppra (?causing agitation) --  Discussed case with Neurologist --Resume phenobarbital by IV --IV  Depakote in place of Keppra --Closely monitor levels    Intellectual disability with epilepsy Additional support from family and staff as needed. Agitation - resuming phenobarbital by IV, changing Keppra >> Depakote IV, hope for improvement. Qtc is normal.  IV Haldol PRN if agitated despite above changes.  Behaviors do not respond to IV Valium.  Hyponatremia Na 133 >> 131 today. Continue IV fluids and TPN Monitor BMP        Subjective: Pt seen with mother at bedside this AM.  He continues to be very restless and agitated at times.  Pt in recliner chair.  Has ongoing mid/lower abdominal pain, no N/V.     Physical Exam: Vitals:   02/22/23 0144 02/22/23 0325 02/22/23 0720 02/22/23 1139  BP:  (!) 161/89 (!) 159/88 (!) 141/83  Pulse:  (!) 101 99 (!) 107  Resp:  Temp:  98.2 F (36.8 C) 98.1 F (36.7 C) 97.7 F (36.5 C)  TempSrc:   Axillary   SpO2:  95% 98% 95%  Weight: 70.3 kg     Height:       General exam: awake, alert, no acute distress, restless, in recliner HEENT: NG tube in place to wall suction, moist mucus membranes, hearing grossly normal  Respiratory system: CTAB, no wheezes, rales or rhonchi, normal respiratory effort. Cardiovascular system: normal S1/S2, RRR, regular rhythm Gastrointestinal system: soft, mildly distended, mid/lower tenderness without rebound or guarding, recent surgical scars healing well. Central nervous system:.exam limited by intellectual disability, does not follow commands, grossly non-focal Extremities: moves all, no edema, normal tone Skin: dry, intact, normal temperature Psychiatry: anxious mood, congruent affect, abnormal judgment and insight   Data Reviewed:  Notable labs ---  Na 131, K 3.2, glucose 174, Ca 7.8, Phos 1.4, albumin 2.4, WBC improved 11.5, Hbg stable 9.8 stable   Triglycerides 203  KUB this AM - slightly decreased dilated bowel in mid-abdomen with significant residuals.  Family Communication: mother bedside  on rounds  Disposition: Status is: Inpatient Remains inpatient appropriate because: severity of illness as above  Planned Discharge Destination: Home    Time spent: 46 minutes  Author: Pennie Banter, DO 02/22/2023 4:54 PM  For on call review [email or web address].

## 2023-02-23 ENCOUNTER — Encounter: Payer: 59 | Admitting: Surgery

## 2023-02-23 DIAGNOSIS — A419 Sepsis, unspecified organism: Secondary | ICD-10-CM | POA: Diagnosis not present

## 2023-02-23 DIAGNOSIS — R6521 Severe sepsis with septic shock: Secondary | ICD-10-CM | POA: Diagnosis not present

## 2023-02-23 LAB — BASIC METABOLIC PANEL
Anion gap: 7 (ref 5–15)
BUN: 19 mg/dL (ref 6–20)
CO2: 23 mmol/L (ref 22–32)
Calcium: 8.3 mg/dL — ABNORMAL LOW (ref 8.9–10.3)
Chloride: 104 mmol/L (ref 98–111)
Creatinine, Ser: 0.76 mg/dL (ref 0.61–1.24)
GFR, Estimated: >60 mL/min (ref >60)
Glucose, Bld: 148 mg/dL — ABNORMAL HIGH (ref 70–99)
Potassium: 3.7 mmol/L (ref 3.5–5.1)
Sodium: 134 mmol/L — ABNORMAL LOW (ref 135–145)

## 2023-02-23 LAB — PHOSPHORUS: Phosphorus: 2.5 mg/dL (ref 2.5–4.6)

## 2023-02-23 LAB — GLUCOSE, CAPILLARY
Glucose-Capillary: 149 mg/dL — ABNORMAL HIGH (ref 70–99)
Glucose-Capillary: 130 mg/dL — ABNORMAL HIGH (ref 70–99)
Glucose-Capillary: 139 mg/dL — ABNORMAL HIGH (ref 70–99)
Glucose-Capillary: 147 mg/dL — ABNORMAL HIGH (ref 70–99)

## 2023-02-23 LAB — PHENOBARBITAL LEVEL: Phenobarbital: 15.4 ug/mL (ref 15.0–40.0)

## 2023-02-23 LAB — CULTURE, BLOOD (ROUTINE X 2): Special Requests: ADEQUATE

## 2023-02-23 LAB — OVA + PARASITE EXAM

## 2023-02-23 LAB — STOOL CULTURE

## 2023-02-23 LAB — O&P RESULT

## 2023-02-23 MED ORDER — PHENOBARBITAL SODIUM 65 MG/ML IJ SOLN
65.0000 mg | Freq: Two times a day (BID) | INTRAMUSCULAR | Status: DC
  Administered 2023-02-23 – 2023-02-24 (×4): 65 mg via INTRAVENOUS
  Filled 2023-02-23 (×3): qty 1

## 2023-02-23 MED ORDER — TRAVASOL 10 % IV SOLN
INTRAVENOUS | Status: AC
  Filled 2023-02-23: qty 1123.2

## 2023-02-23 MED ORDER — POTASSIUM CHLORIDE IN NACL 20-0.9 MEQ/L-% IV SOLN: INTRAVENOUS | Status: DC

## 2023-02-23 NOTE — Progress Notes (Signed)
PHARMACY - TOTAL PARENTERAL NUTRITION CONSULT NOTE   Indication: Prolonged ileus  Patient Measurements: Height:  (177.8 cm) Weight: 70.3 kg (154 lb 15.7 oz) IBW/kg (Calculated) : 73 TPN AdjBW (KG): 76.7 Body mass index is 22.24 kg/m.  Assessment:  52 y.o. male w/ PMH of seizure disorder, IDA, HLD, bilateral Inguinal hernia, ascending colonic mass with tubular adenoma with high grade dysplasia, recent right hemicolectomy admitted on 02/19/2023 with sepsis.   Glucose / Insulin:  --No history of DM --Ordered sensitive SSI q6h --SSI usage last 24h: 6 units --Blood sugar is controlled Electrolytes: Hyponatremia Renal: Scr < 1, stable Hepatic: LFTs within normal limits. No transaminitis Intake / Output:  04/23 0701 - 04/24 0700 In: 1734.2 [I.V.:1488.4; IV Piggyback:245.8] Out: 950 [Urine:200; Emesis/NG output:750]  --On MIVF with NS + 20 mEq K/L at 55 cc/hr GI Imaging:  KUB (02/20/2022): Shows dilated loops of small bowel in RLQ; there is gas in colon, no gross pneumoperitoneum  GI Surgeries / Procedures: s/p hand assisted laparoscopic right hemicolectomy and cholecystectomy   Central access: 02/21/23 TPN start date: 02/21/23  Nutritional Goals: Goal TPN rate is 90 mL/hr (provides 112 g of protein and 2468 kcals per day)  RD Assessment: Estimated Needs Total Energy Estimated Needs: 2200-2500kcal/day Total Protein Estimated Needs: 110-125g/day Total Fluid Estimated Needs: 2.3-2.6L/day  Current Nutrition:  NPO  Plan:  --advance TPN to goal rate of  90 mL/hr  at 1800 --Electrolytes in TPN: Na 22mEq/L, K 13mEq/L, Ca 90mEq/L, Mg 58mEq/L, and Phos 72mmol/L. Cl:Ac 1:1 --Add standard MVI, thiamine, and trace elements to TPN --Continue Sensitive q6h SSI and adjust as needed  --stop MIVF at 1800 --Monitor TPN labs on Mon/Thurs at minimum, daily until stable  Lowella Bandy 02/23/2023,7:00 AM

## 2023-02-23 NOTE — Progress Notes (Addendum)
Nutrition Follow Up Note   DOCUMENTATION CODES:   Not applicable  INTERVENTION:   Continue TPN per pharmacy- provides 2468kcal/day and 112g/day protein   RD will add supplements with diet advancement   Pt at high refeed risk; recommend monitor potassium, magnesium and phosphorus labs daily until stable  NUTRITION DIAGNOSIS:   Inadequate oral intake related to acute illness as evidenced by NPO status. -ongoing   GOAL:   Patient will meet greater than or equal to 90% of their needs -progressing   MONITOR:   Diet advancement, Labs, Weight trends, I & O's, Skin, Other (Comment) (TPN)  ASSESSMENT:   52 y/o male with h/o intellectual disability with epilepsy, HLD, H. Pylori, colon mass and cholelithiasis s/p hand assisted laparoscopic right hemicolectomy with stapled ileocolostomy and laparoscopic cholecystectomy 4/11 who is admitted with diarrhea, sepsis, AKI, PNA and possible SBO.  Visited pt's room 4/23. RD was asked by pt's mother to please not disturb the patient as he was finally sleeping for the first time in many days. Pt does not appear malnourished on gross exam but unable complete NFPE. Spoke with pt's mother outside of the room. Pt's mother is tearful and reports that she is exhausted from this whole ordeal. Mother reports that at baseline, pt is kind hearted and loves everyone. Mother reports that it has been hard for her to watch pt being agitated and confused. Mother reports that pt eats very well at baseline. Pt loves finger foods and mother reports that hotdogs are one of his favorite foods to eat. Mother reports that patient did not eat much of anything after his surgery; mother reports that he would eat a bite or two and then refuse anything else reporting that he wasn't hungry. Mother reports that pt has lost about 12lbs since his surgery; per chart, pt is down ~15lbs(9%) since 4/11. Mother reports that she tried to get pt to drink some Pedialyte prior to admission but  he drank about 50% and refused to drink anymore. Mother reports that she thinks she can get pt to drink some Ensure with diet advancement. Pt is tolerating TPN well; plan is to advance to goal rate tonight. Pt is refeeding; electrolytes being monitored and repleted. NGT in place with output. KUB reports improved dilation of bowel. Pt is having bowel function. Pt still with hiccups. RD will add supplements with diet advancement.      Medications reviewed and include: heparin, insulin, NaCl w/ KCl /hr, zosyn   Labs reviewed: Na 134(L), K 3.7 wnl, P 2.5 wnl Mg 1.8 wnl- 4/23 Triglycerides- 203(H) P 2.8 wnl- 4/21 Wbc- 11.5(H), Hgb 9.8(L), Hct 28.4(L)  NUTRITION - FOCUSED PHYSICAL EXAM: Unable to perform at this time   Diet Order:   Diet Order             Diet NPO time specified  Diet effective now                  EDUCATION NEEDS:   Not appropriate for education at this time  Skin:  Skin Assessment: Reviewed RN Assessment (incision abdomen)  Last BM:  4/23- type 7  Height:   Ht Readings from Last 1 Encounters:  02/19/23  (1.778 m)    Weight:   Wt Readings from Last 1 Encounters:  02/23/23 70.3 kg    Ideal Body Weight:  75.45 kg  BMI:  Body mass index is 22.24 kg/m.  Estimated Nutritional Needs:   Kcal:  2200-2500kcal/day  Protein:  110-125g/day  Fluid:  2.3-2.6L/day  Betsey Holiday MS, RD, LDN Please refer to [facility] for RD and/or RD on-call/weekend/after hours pager

## 2023-02-23 NOTE — Progress Notes (Signed)
Per Dr. Everlene Farrier order, NGT was clamped for 6 hours and residuals from NGT was 25mL. Per order NGT was removed. Patient handled NGT removal very well.

## 2023-02-23 NOTE — Progress Notes (Signed)
Progress Note   Patient: Alec Campbell [id number]:[id number] DOB: 06-15-1971 DOA: 02/19/2023     4 DOS: the patient was seen and examined on 02/23/2023    Subjective:  Patient seen and examined at bedside in the presence of the provider Still having NG tube in place draining a lot of gastric fluid. He admits to improvement in abdominal pain Denies nausea vomiting chest pain or cough    Brief hospital course: "52 y.o male with significant PMH of mild mental retardation/developmental delay, seizure disorder, IDA, HLD,  bilateral Inguinal hernia, ascending colonic mass with tubular adenoma with high grade dysplasia, recent right hemicolectomy with stapled ileocolostomy and Laparoscopic cholecystectomy who presented to the ED with chief complaints of  diarrhea, nausea, vomiting, hiccups, fevers and chills x 7 days.  Patient underwent uncomplicated right-sided colectomy for an ascending colon mass and cholecystectomy for cholelithiasis and chronic cholecystitis by Dr. Everlene Farrier on 02/10/2023.  He was discharge don 02/11/2023 in stable condition. Per pt's mother, patient developed nausea, vomiting and diarrhea on Saturday, April 13.  Was seen by Dr. Everlene Farrier in the office on 02/16/2023 and 2 L of fluids were ordered for 02/17/2023.  Patient's mother reported new fever and 2 syncopal episodes and possible myoclonic jerks the night before presentation with brief LOC.  Unclear if episode was true seizures or not but per mother "he came back too quickly unlike his true seizure".     Pertinent Labs/Diagnostics Findings: Na+/ K+: 125/3.1  Glucose: 206 BUN/Cr.:36/1.145 WBC: Unremarkable  PCT: pending Lactic acid: 4.6 COVID PCR: Negative, Troponin:48   CT Abd/pelvis>multifocal pneumonia. Partial bowel obstruction and ?acute cystitis..."   Pt was treated with broad spectrum IV antibiotics and IV fluids per sepsis protocol and admitted to ICU with septic shock requiring pressors.   General surgery following for SBO. TRH  service assumed care 02/21/23. Further hospital course and management as outlined below.   Assessment and Plan: * Septic shock secondary to multifocal pneumonia-present on admission POA with SIRS criteria: Heart Rate 131 beats/minute, Respiratory Rate 30 breaths/minute,Temperature 103.2, lactic acid 4.6.  Admitted to ICU requiring vasopressors. Currently off vasopressor support --Septic shock & sepsis physiology improved --Trend lactic acid, fever curve, CBC, hemodynamics   Hypophosphatemia Refeeding syndrome Phos 1.4 02/22/2023 IV K-phos was given with improvement Monitor phos daily and replace PRN   Diarrhea Negative C diff and GI panel Follow up pending stool culture & ova/parasites IV hydration Monitor electrolytes & replace PRN   Hypokalemia K 3.1 on admission Ongoing IV replacement with IV fluid additive and K riders. Monitor BMP, replace K as needed Monitor magnesium levels as well as potassium level   AKI (acute kidney injury) RESOLVED with fluids. Likely pre-renal from volume depletion / dehydration, poor PO intake.   Cr 1.45 on admission, baseline 1.03. 4/24: Cr 0.70 --Continue IV fluids --Monitor BMP   Multifocal pneumonia Vs Aspiration PNA and ? UTI Acute respiratory failure with hypoxia due to PNA - resolved Complicated by Septic Shock which has resolved.  Initially treated with IV Vanc / Cefepime / Flagyl --Now on IV Zosyn - continue --Maintenance IV hydration  --Trend lactate, procal, fever curve, CBC, hemodynamics       SBO (small bowel obstruction) In the setting of recent right hemicolectomy and cholecystectomy. --Mgmt per General Surgery --NG tube in place to wall suction --NPO + maintenance IV fluids --Plan for PICC line placement and TPN --Pain control PRN --IV Valium PRN for restlessness / agitation / anxiety -- to keep lines/tubes in place  Colonic mass S/P recent R hemicolectomy. (02/10/23) General surgery following, see SBO   Seizure  disorder ? Syncope unclear if related to seizure or above issues --Seizures precaution --Stop IV Keppra (?causing agitation) --Discussed case with Neurologist --Resume phenobarbital by IV --IV Depakote in place of Keppra --Closely monitor levels      Intellectual disability with epilepsy Additional support from family and staff as needed. Agitation - resuming phenobarbital by IV, changing Keppra >> Depakote IV, hope for improvement. Qtc is normal.  IV Haldol PRN if agitated despite above changes.  Behaviors do not respond to IV Valium.   Hyponatremia likely secondary to the hydration Na 133 >> 131 on 02/22/2023 Continue IV fluids and TPN Monitor BMP     Physical examination General exam: awake, alert, no acute distress, restless, in recliner HEENT: NG tube in place to wall suction, moist mucus membranes, hearing grossly normal  Respiratory system: CTAB, no wheezes, rales or rhonchi, normal respiratory effort. Cardiovascular system: normal S1/S2, RRR, regular rhythm Gastrointestinal system: soft, mildly distended, mid/lower tenderness without rebound or guarding, recent surgical scars healing well. Central nervous system:.exam limited by intellectual disability, does not follow commands, grossly non-focal Extremities: moves all, no edema, normal tone Skin: dry, intact, normal temperature Psychiatry: anxious mood, congruent affect, abnormal judgment and insight      Time spent: 46 minutes  Vitals:   02/22/23 1930 02/23/23 0440 02/23/23 0500 02/23/23 0811  BP: (!) 147/86 (!) 156/88  (!) 148/92  Pulse: (!) 109 (!) 109  (!) 107  Resp: Temp: 98.3 F (36.8 C) 98.2 F (36.8 C)  97.7 F (36.5 C)  TempSrc: Oral Oral  Oral  SpO2: 93% 98%  93%  Weight:   70.3 kg   Height:        Data Reviewed: Laboratory results reviewed by me today showed sodium 134 potassium 3.7 phosphorus 2.5  Family Communication: Patient's brother present at bedside  Disposition: Status is:  Inpatient Still continues to meet inpatient criteria given acute illness requiring NG tube for gastric decompression as well as TPN and surgical consultation   Author: Loyce Dys, MD 02/23/2023 1:57 PM  For on call review [email or web address].

## 2023-02-23 NOTE — Telephone Encounter (Unsigned)
Copied from CRM [id number]. Topic: General - Other >> Feb 23, 2023 12:55 PM Dominique A wrote: Reason for CRM: Vangie Bicker Case Manager is wanting to let pt PCP know pt has been at [facility] since Friday night. Pt had surgery for abnormality found in colonoscopy. They removed part of the colon and got the cancer out, pt is being treated for infection. At this time there is no discharge date for pt.

## 2023-02-23 NOTE — Progress Notes (Signed)
[facility] SURGICAL PROGRESS NOTE  Hospital Day(s): 4.   Interval History:  Patient seen and examined No acute events or new complaints overnight.  Patient's brother at bedside this morning No complaints of pain Still with frequent hiccups No new labs this morning  NGT output 750 ccs; bilious He continues to have watery stool   Vital signs in last 24 hours: [min-max] current  Temp:  [97.7 F (36.5 C)-98.3 F (36.8 C)] 98.2 F (36.8 C) (04/24 0440) Pulse Rate:  [99-109] 109 (04/24 0440) Resp:  [16-18] 18 (04/24 0440) BP: (141-159)/(83-88) 156/88 (04/24 0440) SpO2:  [93 %-98 %] 98 % (04/24 0440) Weight:  [70.3 kg] 70.3 kg (04/24 0500)     Height:  (177.8 cm) Weight: 70.3 kg BMI (Calculated): 22.24   Intake/Output last 2 shifts:  04/23 0701 - 04/24 0700 In: 1734.2 [I.V.:1488.4; IV Piggyback:245.8] Out: 950 [Urine:200; Emesis/NG output:750]   Physical Exam:  Constitutional: alert, cooperative and no distress HEENT: NGT in place; output bilious  Respiratory: breathing non-labored at rest  Cardiovascular: regular rate and sinus rhythm  Gastrointestinal: Soft, distended, no overt tenderness, no rebound, he does not appear peritonitic Integumentary: Laparoscopic incisions are CDI with dermabond, no erythema or drainage. Ecchymosis in RLQ.   Labs:     Latest Ref Rng & Units 02/22/2023    5:51 AM 02/21/2023    4:43 AM 02/20/2023    4:27 AM  CBC  WBC 4.0 - 10.5 K/uL 11.5  16.4  22.5   Hemoglobin 13.0 - 17.0 g/dL 9.8  16.1  09.6   Hematocrit 39.0 - 52.0 % 28.4  29.7  28.2   Platelets 150 - 400 K/uL 188  195  191       Latest Ref Rng & Units 02/22/2023    5:51 AM 02/21/2023    4:43 AM 02/20/2023    4:27 AM  CMP  Glucose 70 - 99 mg/dL 045  409  811   BUN 6 - 20 mg/dL Creatinine 0.61 - 1.24 mg/dL 9.14  7.82  9.56   Sodium 135 - 145 mmol/L 131  133  129   Potassium 3.5 - 5.1 mmol/L 3.2  3.1  3.1   Chloride 98 - 111 mmol/L 99  97  95   CO2  22 - 32 mmol/L Calcium 8.9 - 10.3 mg/dL 7.8  7.8  7.8   Total Protein 6.5 - 8.1 g/dL 5.5     Total Bilirubin 0.3 - 1.2 mg/dL 0.5     Alkaline Phos 38 - 126 U/L 89     AST 15 - 41 U/L 37     ALT 0 - 44 U/L 28       Imaging studies:  No new imaging studies   Assessment/Plan:  52 y.o. male with likely ileus 12 days s/p hand assisted laparoscopic right hemicolectomy and cholecystectomy for right colon cancer and cholelithiasis, complicated by pertinent comorbidities including developmental delay.    - Continue NGT decompression; LIS; monitor and record output. I do think he is making progress but cautious to remove NGT too early. I do think we may be able to proceed with clamping trial tomorrow morning pending clinical condition.    - Continue PICC + TPN; advance to goal  - Continue NPO for now; okay for a few ice chips - Monitor abdominal examination; on-going bowel function - Serial KUBs prn - Monitor leukocytosis; improving - Monitor  renal function - Mobilize as tolerated   - Further management per primary service; we will follow   All of the above findings and recommendations were discussed with the patient, patient's family (brother at bedside), and the medical team, and all of their questions were answered to their expressed satisfaction.  -- Lynden Oxford, PA-C [facility] 02/23/2023, 7:06 AM M-F: 7am - 4pm

## 2023-02-24 ENCOUNTER — Inpatient Hospital Stay: Payer: 59

## 2023-02-24 DIAGNOSIS — R6521 Severe sepsis with septic shock: Secondary | ICD-10-CM | POA: Diagnosis not present

## 2023-02-24 DIAGNOSIS — A419 Sepsis, unspecified organism: Secondary | ICD-10-CM | POA: Diagnosis not present

## 2023-02-24 LAB — VALPROIC ACID LEVEL: Valproic Acid Lvl: 25 ug/mL — ABNORMAL LOW (ref 50.0–100.0)

## 2023-02-24 LAB — CBC WITH DIFFERENTIAL/PLATELET
Abs Immature Granulocytes: 0.85 10*3/uL — ABNORMAL HIGH (ref 0.00–0.07)
Basophils Absolute: 0.1 10*3/uL (ref 0.0–0.1)
Basophils Relative: 1 %
Eosinophils Absolute: 0.4 10*3/uL (ref 0.0–0.5)
Eosinophils Relative: 3 %
HCT: 27.1 % — ABNORMAL LOW (ref 39.0–52.0)
Hemoglobin: 9.1 g/dL — ABNORMAL LOW (ref 13.0–17.0)
Immature Granulocytes: 6 %
Lymphocytes Relative: 9 %
Lymphs Abs: 1.2 10*3/uL (ref 0.7–4.0)
MCH: 29.3 pg (ref 26.0–34.0)
MCHC: 33.6 g/dL (ref 30.0–36.0)
MCV: 87.1 fL (ref 80.0–100.0)
Monocytes Absolute: 1.4 10*3/uL — ABNORMAL HIGH (ref 0.1–1.0)
Monocytes Relative: 9 %
Neutro Abs: 10.5 10*3/uL — ABNORMAL HIGH (ref 1.7–7.7)
Neutrophils Relative %: 72 %
Platelets: 202 10*3/uL (ref 150–400)
RBC: 3.11 MIL/uL — ABNORMAL LOW (ref 4.22–5.81)
RDW: 13.4 % (ref 11.5–15.5)
Smear Review: NORMAL
WBC: 14.4 10*3/uL — ABNORMAL HIGH (ref 4.0–10.5)
nRBC: 0 % (ref 0.0–0.2)

## 2023-02-24 LAB — COMPREHENSIVE METABOLIC PANEL
ALT: 25 U/L (ref 0–44)
AST: 30 U/L (ref 15–41)
Albumin: 2.4 g/dL — ABNORMAL LOW (ref 3.5–5.0)
Alkaline Phosphatase: 93 U/L (ref 38–126)
Anion gap: 7 (ref 5–15)
BUN: 21 mg/dL — ABNORMAL HIGH (ref 6–20)
CO2: 23 mmol/L (ref 22–32)
Calcium: 8 mg/dL — ABNORMAL LOW (ref 8.9–10.3)
Chloride: 106 mmol/L (ref 98–111)
Creatinine, Ser: 0.61 mg/dL (ref 0.61–1.24)
GFR, Estimated: >60 mL/min (ref >60)
Glucose, Bld: 145 mg/dL — ABNORMAL HIGH (ref 70–99)
Potassium: 3.8 mmol/L (ref 3.5–5.1)
Sodium: 136 mmol/L (ref 135–145)
Total Bilirubin: 0.3 mg/dL (ref 0.3–1.2)
Total Protein: 5.7 g/dL — ABNORMAL LOW (ref 6.5–8.1)

## 2023-02-24 LAB — PHOSPHORUS: Phosphorus: 2.6 mg/dL (ref 2.5–4.6)

## 2023-02-24 LAB — TRIGLYCERIDES: Triglycerides: 217 mg/dL — ABNORMAL HIGH (ref <150)

## 2023-02-24 LAB — GLUCOSE, CAPILLARY
Glucose-Capillary: 111 mg/dL — ABNORMAL HIGH (ref 70–99)
Glucose-Capillary: 145 mg/dL — ABNORMAL HIGH (ref 70–99)
Glucose-Capillary: 138 mg/dL — ABNORMAL HIGH (ref 70–99)

## 2023-02-24 LAB — MAGNESIUM: Magnesium: 1.8 mg/dL (ref 1.7–2.4)

## 2023-02-24 LAB — CULTURE, BLOOD (ROUTINE X 2): Special Requests: ADEQUATE

## 2023-02-24 LAB — PHENOBARBITAL LEVEL: Phenobarbital: 14.1 ug/mL — ABNORMAL LOW (ref 15.0–40.0)

## 2023-02-24 MED ORDER — MAGNESIUM SULFATE 2 GM/50ML IV SOLN
2.0000 g | Freq: Once | INTRAVENOUS | Status: AC
  Administered 2023-02-24: 2 g via INTRAVENOUS
  Filled 2023-02-24: qty 50

## 2023-02-24 MED ORDER — TRAVASOL 10 % IV SOLN
INTRAVENOUS | Status: AC
  Filled 2023-02-24: qty 1123.2

## 2023-02-24 NOTE — Progress Notes (Signed)
[facility] SURGICAL PROGRESS NOTE  Hospital Day(s): 5.   Interval History:  Patient seen and examined No acute events or new complaints overnight.  Patient's mother reports he had a good night No complaints this AM  Leukocytosis slightly elevated this AM; 14.4K Hgb to 9.1 CMP reassuring; no electrolyte derangements NGT removed last night after passing clamping trial He continues to have watery stool  NPO this AM  Vital signs in last 24 hours: [min-max] current  Temp:  [97.7 F (36.5 C)-98.6 F (37 C)] 98 F (36.7 C) (04/25 0718) Pulse Rate:  [96-108] 97 (04/25 0718) Resp:  [18-20] 20 (04/25 0718) BP: (146-165)/(82-92) 165/82 (04/25 0718) SpO2:  [93 %-97 %] 96 % (04/25 0718) Weight:  [70.4 kg] 70.4 kg (04/25 0500)     Height:  (177.8 cm) Weight: 70.4 kg BMI (Calculated): 22.25   Intake/Output last 2 shifts:  04/24 0701 - 04/25 0700 In: 1366.2 [I.V.:1057.1; IV Piggyback:309.1] Out: -    Physical Exam:  Constitutional: alert, cooperative and no distress; sitting in chair Respiratory: breathing non-labored at rest  Cardiovascular: regular rate and sinus rhythm  Gastrointestinal: Soft, distended, no overt tenderness, no rebound, he does not appear peritonitic Integumentary: Laparoscopic incisions are CDI with dermabond, no erythema or drainage. Ecchymosis in RLQ.   Labs:     Latest Ref Rng & Units 02/24/2023    5:06 AM 02/22/2023    5:51 AM 02/21/2023    4:43 AM  CBC  WBC 4.0 - 10.5 K/uL 14.4  11.5  16.4   Hemoglobin 13.0 - 17.0 g/dL 9.1  9.8  78.2   Hematocrit 39.0 - 52.0 % 27.1  28.4  29.7   Platelets 150 - 400 K/uL 202  188  195       Latest Ref Rng & Units 02/24/2023    5:06 AM 02/23/2023    8:01 AM 02/22/2023    5:51 AM  CMP  Glucose 70 - 99 mg/dL 956  213  086   BUN 6 - 20 mg/dL Creatinine 0.61 - 1.24 mg/dL 5.78  4.69  6.29   Sodium 135 - 145 mmol/L 136  134  131   Potassium 3.5 - 5.1 mmol/L 3.8  3.7  3.2   Chloride 98 -  111 mmol/L 106  104  99   CO2 22 - 32 mmol/L Calcium 8.9 - 10.3 mg/dL 8.0  8.3  7.8   Total Protein 6.5 - 8.1 g/dL 5.7   5.5   Total Bilirubin 0.3 - 1.2 mg/dL 0.3   0.5   Alkaline Phos 38 - 126 U/L 93   89   AST 15 - 41 U/L 30   37   ALT 0 - 44 U/L 25   28     Imaging studies:  No new imaging studies   Assessment/Plan:  52 y.o. male with likely ileus 12 days s/p hand assisted laparoscopic right hemicolectomy and cholecystectomy for right colon cancer and cholelithiasis, complicated by pertinent comorbidities including developmental delay.    - CLD this AM  - Continue PICC + TPN; at goal - Monitor abdominal examination; on-going bowel function - Monitor leukocytosis - Monitor renal function - Mobilize as tolerated   - Further management per primary service; we will follow   All of the above findings and recommendations were discussed with the patient, patient's family (mother at bedside), and the medical team, and all of their  questions were answered to their expressed satisfaction.  -- Lynden Oxford, PA-C [facility] 02/24/2023, 7:49 AM M-F: 7am - 4pm

## 2023-02-24 NOTE — Progress Notes (Signed)
PHARMACY - TOTAL PARENTERAL NUTRITION CONSULT NOTE   Indication: Prolonged ileus  Patient Measurements: Height:  (177.8 cm) Weight: 70.4 kg (155 lb 1.5 oz) IBW/kg (Calculated) : 73 TPN AdjBW (KG): 76.7 Body mass index is 22.25 kg/m.  Assessment:  52 y.o. male w/ PMH of seizure disorder, IDA, HLD, bilateral Inguinal hernia, ascending colonic mass with tubular adenoma with high grade dysplasia, recent right hemicolectomy admitted on 02/19/2023 with sepsis.   Glucose / Insulin:  --No history of DM --Ordered sensitive SSI q6h --SSI usage last 24h: 1 units --Blood sugar is controlled Electrolytes: Hyponatremia Renal: Scr < 1, stable Hepatic: LFTs within normal limits. No transaminitis TG 203 -- >217 Intake / Output: I/O net (-) 1700 mL GI Imaging:  KUB (02/20/2022): Shows dilated loops of small bowel in RLQ; there is gas in colon, no gross pneumoperitoneum  GI Surgeries / Procedures: s/p hand assisted laparoscopic right hemicolectomy and cholecystectomy   Central access: 02/21/23 TPN start date: 02/21/23  Nutritional Goals: Goal TPN rate is 90 mL/hr (provides 112 g of protein and 2468 kcals per day)  RD Assessment: Estimated Needs Total Energy Estimated Needs: 2200-2500kcal/day Total Protein Estimated Needs: 110-125g/day Total Fluid Estimated Needs: 2.3-2.6L/day  Current Nutrition:  NPO  Plan:  --continue TPN at goal rate of  90 mL/hr  --Electrolytes in TPN: Na 58mEq/L, K 51mEq/L, Ca 33mEq/L, Mg 73mEq/L, and Phos 67mmol/L. Cl:Ac 1:1 --Add standard MVI, thiamine, and trace elements to TPN --Continue Sensitive q6h SSI and adjust as needed  --2 grams IV magnesium sulfate x 1 --Monitor TPN labs on Mon/Thurs at minimum, daily until stable  Lowella Bandy 02/24/2023,6:59 AM

## 2023-02-24 NOTE — Progress Notes (Signed)
Progress Note   Patient: Alec Campbell WUX:[id number] DOB: 1971-05-06 DOA: 02/19/2023     5 DOS: the patient was seen and examined on 02/24/2023     Subjective:  Patient seen and examined at bedside in the presence of the mother NG tube has been removed by surgeon He admits to improvement in abdominal pain Denies nausea vomiting chest pain or cough     Brief hospital course: "52 y.o male with significant PMH of mild mental retardation/developmental delay, seizure disorder, IDA, HLD,  bilateral Inguinal hernia, ascending colonic mass with tubular adenoma with high grade dysplasia, recent right hemicolectomy with stapled ileocolostomy and Laparoscopic cholecystectomy who presented to the ED with chief complaints of  diarrhea, nausea, vomiting, hiccups, fevers and chills x 7 days.  Patient underwent uncomplicated right-sided colectomy for an ascending colon mass and cholecystectomy for cholelithiasis and chronic cholecystitis by Dr. Everlene Farrier on 02/10/2023.  He was discharge don 02/11/2023 in stable condition. Per pt's mother, patient developed nausea, vomiting and diarrhea on Saturday, April 13.  Was seen by Dr. Everlene Farrier in the office on 02/16/2023 and 2 L of fluids were ordered for 02/17/2023.  Patient's mother reported new fever and 2 syncopal episodes and possible myoclonic jerks the night before presentation with brief LOC.  Unclear if episode was true seizures or not but per mother "he came back too quickly unlike his true seizure".      Pertinent Labs/Diagnostics Findings: Na+/ K+: 125/3.1  Glucose: 206 BUN/Cr.:36/1.145 WBC: Unremarkable  PCT: pending Lactic acid: 4.6 COVID PCR: Negative, Troponin:48   CT Abd/pelvis>multifocal pneumonia. Partial bowel obstruction and ?acute cystitis..."   Pt was treated with broad spectrum IV antibiotics and IV fluids per sepsis protocol and admitted to ICU with septic shock requiring pressors.   General surgery following for SBO. TRH service assumed care  02/21/23.  Further hospital course and management as outlined below.   Assessment and Plan: * Septic shock secondary to multifocal pneumonia-present on admission POA with SIRS criteria: Heart Rate 131 beats/minute, Respiratory Rate 30 breaths/minute,Temperature 103.2, lactic acid 4.6.  Admitted to ICU requiring vasopressors. Currently off vasopressor support --Septic shock & sepsis physiology improved --Trend lactic acid, fever curve, CBC, hemodynamics   Hypophosphatemia Refeeding syndrome Phos 1.4 02/22/2023 IV K-phos was given with improvement Monitor phos daily and replace PRN   Diarrhea Negative C diff and GI panel Follow up pending stool culture & ova/parasites IV hydration Monitor electrolytes & replace PRN   Hypokalemia K 3.1 on admission Ongoing IV replacement with IV fluid additive and K riders. Monitor BMP, replace K as needed Monitor magnesium levels as well as potassium level   AKI (acute kidney injury) RESOLVED with fluids. Likely pre-renal from volume depletion / dehydration, poor PO intake.   Cr 1.45 on admission, baseline 1.03. 4/24: Cr 0.70 --Continue IV fluids --Monitor BMP   Multifocal pneumonia Vs Aspiration PNA and ? UTI Acute respiratory failure with hypoxia due to PNA - resolved Complicated by Septic Shock which has resolved.  Initially treated with IV Vanc / Cefepime / Flagyl --Now on IV Zosyn - continue --Maintenance IV hydration  --Trend lactate, procal, fever curve, CBC, hemodynamics       SBO (small bowel obstruction) In the setting of recent right hemicolectomy and cholecystectomy. --Mgmt per General Surgery -- NG tube have been removed by surgeon ---Advancement of diet according to surgical recommendation -- Continue TPN through PICC line --Pain control PRN --IV Valium PRN for restlessness / agitation / anxiety -- to keep lines/tubes in place  Colonic mass S/P recent R hemicolectomy. (02/10/23) General surgery following, see SBO    Seizure disorder ? Syncope unclear if related to seizure or above issues --Seizures precaution --Stop IV Keppra (?causing agitation) --Discussed case with Neurologist --Resumed phenobarbital by IV --IV Depakote in place of Keppra --Closely monitor levels      Intellectual disability with epilepsy Additional support from family and staff as needed. Agitation - resuming phenobarbital by IV, changing Keppra >> Depakote IV, hope for improvement. Qtc is normal.  IV Haldol PRN if agitated despite above changes.  Behaviors do not respond to IV Valium.   Hyponatremia likely secondary to the hydration Na 133 >> 131 on 02/22/2023 Continue IV fluids and TPN Monitor BMP     Physical examination General exam: awake, alert, no acute distress, restless, in recliner HEENT: NG tube in place to wall suction, moist mucus membranes, hearing grossly normal  Respiratory system: CTAB, no wheezes, rales or rhonchi, normal respiratory effort. Cardiovascular system: normal S1/S2, RRR, regular rhythm Gastrointestinal system: soft, mildly distended, mid/lower tenderness without rebound or guarding, recent surgical scars healing well. Central nervous system:.exam limited by intellectual disability, does not follow commands, grossly non-focal Extremities: moves all, no edema, normal tone Skin: dry, intact, normal temperature Psychiatry: Normal mood     Data Reviewed: I reviewed patient's laboratory results showing sodium 136 potassium 3.8 glucose 145 and hemoglobin 14.4    Family Communication: Patient's brother present at bedside   Disposition: Status is: Inpatient Still continues to meet inpatient criteria given acute illness requiring TPN and surgical consultation   Time spent: 45 minutes     Vitals:   02/23/23 2006 02/24/23 0332 02/24/23 0500 02/24/23 0718  BP: (!) 154/88 (!) 150/91  (!) 165/82  Pulse: (!) 108 96  97  Resp: Temp: 97.8 F (36.6 C) 98.2 F (36.8 C)  98 F (36.7  C)  TempSrc: Oral     SpO2: 95% 97%  96%  Weight:   70.4 kg   Height:         Author: Loyce Dys, MD 02/24/2023 1:38 PM  For on call review [email or web address].

## 2023-02-24 NOTE — TOC Progression Note (Signed)
Transition of Care [facility]) - Progression Note    Patient Details  Name: Alec Campbell MRN: [id number] Date of Birth: 28-Jan-1971  [facility] CM/SW Contact  Chapman Fitch, RN Phone Number: 02/24/2023, 1:37 PM  Clinical Narrative:     Mother Alec Campbell requests information on incontinence supplies through [place].  She states they do have an assigned case worker through [employer].  I encouraged her to reach out to them to determine what the process is        Expected Discharge Plan and Services                                               Social Determinants of Health (SDOH) Interventions SDOH Screenings   Food Insecurity: No Food Insecurity (02/19/2023)  Housing: Low Risk  (02/19/2023)  Transportation Needs: No Transportation Needs (02/19/2023)  Utilities: Not At Risk (02/19/2023)  Alcohol Screen: Low Risk  (04/15/2022)  Depression (PHQ2-9): Low Risk  (01/13/2023)  Financial Resource Strain: Low Risk  (04/15/2022)  Physical Activity: Insufficiently Active (04/15/2022)  Social Connections: Socially Isolated (04/15/2022)  Stress: No Stress Concern Present (04/15/2022)  Tobacco Use: Low Risk  (02/17/2023)    Readmission Risk Interventions     No data to display

## 2023-02-25 ENCOUNTER — Inpatient Hospital Stay: Payer: 59

## 2023-02-25 DIAGNOSIS — R6521 Severe sepsis with septic shock: Secondary | ICD-10-CM | POA: Diagnosis not present

## 2023-02-25 DIAGNOSIS — A419 Sepsis, unspecified organism: Secondary | ICD-10-CM | POA: Diagnosis not present

## 2023-02-25 LAB — VALPROIC ACID LEVEL: Valproic Acid Lvl: 32 ug/mL — ABNORMAL LOW (ref 50.0–100.0)

## 2023-02-25 LAB — CBC WITH DIFFERENTIAL/PLATELET
Abs Immature Granulocytes: 1.43 10*3/uL — ABNORMAL HIGH (ref 0.00–0.07)
Basophils Absolute: 0.1 10*3/uL (ref 0.0–0.1)
Basophils Relative: 1 %
Eosinophils Absolute: 0.5 10*3/uL (ref 0.0–0.5)
Eosinophils Relative: 3 %
HCT: 27.9 % — ABNORMAL LOW (ref 39.0–52.0)
Hemoglobin: 9.5 g/dL — ABNORMAL LOW (ref 13.0–17.0)
Immature Granulocytes: 8 %
Lymphocytes Relative: 9 %
Lymphs Abs: 1.7 10*3/uL (ref 0.7–4.0)
MCH: 29.3 pg (ref 26.0–34.0)
MCHC: 34.1 g/dL (ref 30.0–36.0)
MCV: 86.1 fL (ref 80.0–100.0)
Monocytes Absolute: 1.6 10*3/uL — ABNORMAL HIGH (ref 0.1–1.0)
Monocytes Relative: 8 %
Neutro Abs: 13.6 10*3/uL — ABNORMAL HIGH (ref 1.7–7.7)
Neutrophils Relative %: 71 %
Platelets: 255 10*3/uL (ref 150–400)
RBC: 3.24 MIL/uL — ABNORMAL LOW (ref 4.22–5.81)
RDW: 13.3 % (ref 11.5–15.5)
Smear Review: NORMAL
WBC: 18.8 10*3/uL — ABNORMAL HIGH (ref 4.0–10.5)
nRBC: 0 % (ref 0.0–0.2)

## 2023-02-25 LAB — GLUCOSE, CAPILLARY
Glucose-Capillary: 142 mg/dL — ABNORMAL HIGH (ref 70–99)
Glucose-Capillary: 135 mg/dL — ABNORMAL HIGH (ref 70–99)
Glucose-Capillary: 105 mg/dL — ABNORMAL HIGH (ref 70–99)
Glucose-Capillary: 109 mg/dL — ABNORMAL HIGH (ref 70–99)

## 2023-02-25 LAB — BASIC METABOLIC PANEL
Anion gap: 6 (ref 5–15)
BUN: 16 mg/dL (ref 6–20)
CO2: 22 mmol/L (ref 22–32)
Calcium: 8.1 mg/dL — ABNORMAL LOW (ref 8.9–10.3)
Chloride: 103 mmol/L (ref 98–111)
Creatinine, Ser: 0.52 mg/dL — ABNORMAL LOW (ref 0.61–1.24)
GFR, Estimated: >60 mL/min (ref >60)
Glucose, Bld: 131 mg/dL — ABNORMAL HIGH (ref 70–99)
Potassium: 3.7 mmol/L (ref 3.5–5.1)
Sodium: 131 mmol/L — ABNORMAL LOW (ref 135–145)

## 2023-02-25 LAB — STOOL CULTURE: E coli, Shiga toxin Assay: NEGATIVE

## 2023-02-25 LAB — MAGNESIUM: Magnesium: 1.8 mg/dL (ref 1.7–2.4)

## 2023-02-25 MED ORDER — ZINC OXIDE 40 % EX OINT
TOPICAL_OINTMENT | Freq: Two times a day (BID) | CUTANEOUS | Status: DC | PRN
  Filled 2023-02-25: qty 113

## 2023-02-25 MED ORDER — TRAVASOL 10 % IV SOLN
INTRAVENOUS | Status: AC
  Filled 2023-02-25: qty 1123.2

## 2023-02-25 MED ORDER — PHENOBARBITAL 32.4 MG PO TABS
64.8000 mg | ORAL_TABLET | Freq: Two times a day (BID) | ORAL | Status: DC
  Administered 2023-02-25 – 2023-03-03 (×12): 64.8 mg via ORAL
  Filled 2023-02-25 (×12): qty 2

## 2023-02-25 MED ORDER — IOHEXOL 9 MG/ML PO SOLN
500.0000 mL | ORAL | Status: AC
  Administered 2023-02-25 (×2): 500 mL via ORAL

## 2023-02-25 MED ORDER — CARBAMAZEPINE 200 MG PO TABS
200.0000 mg | ORAL_TABLET | Freq: Two times a day (BID) | ORAL | Status: DC
  Administered 2023-02-25 – 2023-03-02 (×12): 200 mg via ORAL
  Filled 2023-02-25 (×13): qty 1

## 2023-02-25 MED ORDER — IOHEXOL 300 MG/ML  SOLN
100.0000 mL | Freq: Once | INTRAMUSCULAR | Status: AC | PRN
  Administered 2023-02-25: 100 mL via INTRAVENOUS

## 2023-02-25 MED ORDER — LOPERAMIDE HCL 2 MG PO CAPS
2.0000 mg | ORAL_CAPSULE | Freq: Two times a day (BID) | ORAL | Status: DC
  Administered 2023-02-25 – 2023-02-27 (×6): 2 mg via ORAL
  Filled 2023-02-25 (×6): qty 1

## 2023-02-25 MED ORDER — CARBAMAZEPINE 200 MG PO TABS
400.0000 mg | ORAL_TABLET | Freq: Every morning | ORAL | Status: DC
  Administered 2023-02-26 – 2023-03-03 (×6): 400 mg via ORAL
  Filled 2023-02-25 (×6): qty 2

## 2023-02-25 NOTE — Care Management Important Message (Signed)
Important Message  Patient Details  Name: Alec Campbell MRN: [id number] Date of Birth: 29-Apr-1971   Medicare Important Message Given:  Yes     [name] 02/25/2023, 11:56 AM

## 2023-02-25 NOTE — Progress Notes (Signed)
Progress Note   Patient: Alec Campbell ZOX:[id number] DOB: [birth year] DOA: 02/19/2023     6 DOS: the patient was seen and examined on 02/25/2023     Subjective:  Patient seen and examined at bedside in the presence of the mother According to the mother patient had episode of diarrhea today however is unchanged from his chronic diarrhea Leukocytosis of worsening today and CT scan have been requested Denies nausea vomiting chest pain or cough     Brief hospital course: "52 y.o male with significant PMH of mild mental retardation/developmental delay, seizure disorder, IDA, HLD,  bilateral Inguinal hernia, ascending colonic mass with tubular adenoma with high grade dysplasia, recent right hemicolectomy with stapled ileocolostomy and Laparoscopic cholecystectomy who presented to the ED with chief complaints of  diarrhea, nausea, vomiting, hiccups, fevers and chills x 7 days.  Patient underwent uncomplicated right-sided colectomy for an ascending colon mass and cholecystectomy for cholelithiasis and chronic cholecystitis by Dr. Everlene Farrier on 02/10/2023.  He was discharge don 02/11/2023 in stable condition. Per pt's mother, patient developed nausea, vomiting and diarrhea on Saturday, April 13.  Was seen by Dr. Everlene Farrier in the office on 02/16/2023 and 2 L of fluids were ordered for 02/17/2023.  Patient's mother reported new fever and 2 syncopal episodes and possible myoclonic jerks the night before presentation with brief LOC.  Unclear if episode was true seizures or not but per mother "he came back too quickly unlike his true seizure".      Pertinent Labs/Diagnostics Findings: Na+/ K+: 125/3.1  Glucose: 206 BUN/Cr.:36/1.145 WBC: Unremarkable  PCT: pending Lactic acid: 4.6 COVID PCR: Negative, Troponin:48   CT Abd/pelvis>multifocal pneumonia. Partial bowel obstruction and ?acute cystitis..."   Pt was treated with broad spectrum IV antibiotics and IV fluids per sepsis protocol and admitted to ICU with septic shock  requiring pressors.   General surgery following for SBO. TRH service assumed care 02/21/23.   Further hospital course and management as outlined below.   Assessment and Plan: Septic shock secondary to multifocal pneumonia-present on admission POA with SIRS criteria: Heart Rate 131 beats/minute, Respiratory Rate 30 breaths/minute,Temperature 103.2, lactic acid 4.6.  Admitted to ICU requiring vasopressors. Currently off vasopressor support --Septic shock & sepsis physiology improved -- Continue current antibiotics   Hypophosphatemia Refeeding syndrome Phos 1.4 02/22/2023 IV K-phos was given with improvement Currently on TPN   Chronic diarrhea Negative C diff and GI panel According to patient's family patient has been this chronic diarrhea for some time now  IV hydration From discussion with infectious disease this is not concerning for C. difficile since patient tested negative on presentation. Monitor electrolytes & replace PRN   Hypokalemia K 3.1 on admission Ongoing IV replacement with IV fluid additive and K riders. Monitor BMP, replace K as needed Monitor magnesium levels as well as potassium level   AKI (acute kidney injury) RESOLVED with fluids. Likely pre-renal from volume depletion / dehydration, poor PO intake.   Cr 1.45 on admission, baseline 1.03. 4/24: Cr 0.70 --Continue IV fluids --Monitor BMP   Multifocal pneumonia Vs Aspiration PNA and ? UTI Acute respiratory failure with hypoxia due to PNA - resolved Complicated by Septic Shock which has resolved.  Initially treated with IV Vanc / Cefepime / Flagyl --Now on IV Zosyn - continue --Maintenance IV hydration        SBO (small bowel obstruction) In the setting of recent right hemicolectomy and cholecystectomy. --Mgmt per General Surgery -- NG tube have been removed by surgeon ---Advancement of diet according  to surgical recommendation -- Continue TPN through PICC line --Pain control PRN --IV Valium PRN  for restlessness / agitation / anxiety -- to keep lines/tubes in place Follow-up on abdominal CT scan requested by surgeon   Colonic mass S/P recent R hemicolectomy. (02/10/23) General surgery following, see SBO   Seizure disorder ? Syncope unclear if related to seizure or above issues --Seizures precaution --Stop IV Keppra (?causing agitation) --Discussed case with Neurologist --Resumed phenobarbital by IV --IV Depakote in place of Keppra --Closely monitor levels      Intellectual disability with epilepsy Additional support from family and staff as needed. Agitation -continue phenobarbital by IV, changing Keppra >> Depakote IV, hope for improvement. Qtc is normal.  IV Haldol PRN if agitated despite above changes.      Hyponatremia likely secondary to the hydration Na 133 >> 131 on 02/22/2023 Continue IV fluids and TPN Monitor BMP     Physical examination General exam: awake, alert, no acute distress, restless, in recliner HEENT: NG tube in place to wall suction, moist mucus membranes, hearing grossly normal  Respiratory system: CTAB, no wheezes, rales or rhonchi, normal respiratory effort. Cardiovascular system: normal S1/S2, RRR, regular rhythm Gastrointestinal system: soft, mildly distended, mid/lower tenderness without rebound or guarding, recent surgical scars healing well. Central nervous system:.exam limited by intellectual disability, does not follow commands, grossly non-focal Extremities: moves all, no edema, normal tone Skin: dry, intact, normal temperature Psychiatry: Normal mood       Data Reviewed: I reviewed patient's laboratory results showing sodium 131 potassium 3.7 with worsening leukocytosis WBC 18.8 hemoglobin 9.5     Family Communication: Patient's mother present at bedside   Disposition: Status is: Inpatient Still continues to meet inpatient criteria given acute illness requiring TPN and surgical consultation   Time spent: 45 minutes        Vitals:   02/24/23 1906 02/25/23 0524 02/25/23 0718 02/25/23 1000  BP: (!) 152/82 (!) 155/84 136/74   Pulse: 90 95 100   Resp: 20 18 (!) 23   Temp: 97.9 F (36.6 C) 97.9 F (36.6 C) (!) 97.4 F (36.3 C)   TempSrc:      SpO2: 98% 97% 97%   Weight:    69.9 kg  Height:        Author: Loyce Dys, MD 02/25/2023 1:08 PM  For on call review [email or web address].

## 2023-02-25 NOTE — Progress Notes (Signed)
PHARMACY - TOTAL PARENTERAL NUTRITION CONSULT NOTE   Indication: Prolonged ileus  Patient Measurements: Height: 5\' 10"  (177.8 cm) Weight: 70.4 kg (155 lb 1.5 oz) IBW/kg (Calculated) : 73 TPN AdjBW (KG): 76.7 Body mass index is 22.25 kg/m.  Assessment:  52 y.o. male w/ PMH of seizure disorder, IDA, HLD, bilateral Inguinal hernia, ascending colonic mass with tubular adenoma with high grade dysplasia, recent right hemicolectomy admitted on 02/19/2023 with sepsis.   Glucose / Insulin:  --No history of DM --Ordered sensitive SSI q6h --SSI usage last 24h: 0 units --Blood sugar is controlled Electrolytes: Hyponatremia Renal: Scr < 1, stable Hepatic: LFTs within normal limits. No transaminitis TG 203 -- >217 Intake / Output: I/O net (-) 1700 mL GI Imaging:  KUB (02/20/2022): Shows dilated loops of small bowel in RLQ; there is gas in colon, no gross pneumoperitoneum  GI Surgeries / Procedures: s/p hand assisted laparoscopic right hemicolectomy and cholecystectomy   Central access: 02/21/23 TPN start date: 02/21/23  Nutritional Goals: Goal TPN rate is 90 mL/hr (provides 112 g of protein and 2468 kcals per day)  RD Assessment: Estimated Needs Total Energy Estimated Needs: 2200-2500kcal/day Total Protein Estimated Needs: 110-125g/day Total Fluid Estimated Needs: 2.3-2.6L/day  Current Nutrition:  NPO  Plan:  --continue TPN at goal rate of  90 mL/hr  --Electrolytes in TPN: Na 36mEq/L, K 27mEq/L, Ca 25mEq/L, Mg 45mEq/L, and Phos 59mmol/L. Cl:Ac 1:1 --Add standard MVI, thiamine, and trace elements to TPN --stop SSI  --Monitor TPN labs on Mon/Thurs at minimum, daily until stable  Lowella Bandy 02/25/2023,8:22 AM

## 2023-02-25 NOTE — Progress Notes (Signed)
[facility] SURGICAL PROGRESS NOTE  Hospital Day(s): 6.   Interval History:  Patient seen and examined No acute events or new complaints overnight.  Biggest issue remains diarrhea; frequent; watery Does not appear to have any abdominal pain, no nausea, emesis  Leukocytosis continues to climb; now 18.8K Hgb to 9.5 Renal function normal; sCr - 0.52; UO - unmeasured CLD; tolerating but no appetite according to mother  Vital signs in last 24 hours: [min-max] current  Temp:  [97.4 F (36.3 C)-97.9 F (36.6 C)] 97.4 F (36.3 C) (04/26 0718) Pulse Rate:  [90-100] 100 (04/26 0718) Resp:  [18-23] 23 (04/26 0718) BP: (136-155)/(74-84) 136/74 (04/26 0718) SpO2:  [97 %-98 %] 97 % (04/26 0718)     Height: 5\' 10"  (177.8 cm) Weight: 70.4 kg BMI (Calculated): 22.25   Intake/Output last 2 shifts:  04/25 0701 - 04/26 0700 In: 1792.6 [P.O.:600; I.V.:989.2; IV Piggyback:203.4] Out: -    Physical Exam:  Constitutional: alert, cooperative and no distress; sitting in chair Respiratory: breathing non-labored at rest  Cardiovascular: regular rate and sinus rhythm  Gastrointestinal: Soft, non-distended, no overt tenderness, no rebound, he does not appear peritonitic Integumentary: Laparoscopic incisions are CDI with dermabond, no erythema or drainage. Ecchymosis in RLQ.   Labs:     Latest Ref Rng & Units 02/25/2023    4:17 AM 02/24/2023    5:06 AM 02/22/2023    5:51 AM  CBC  WBC 4.0 - 10.5 K/uL 18.8  14.4  11.5   Hemoglobin 13.0 - 17.0 g/dL 9.5  9.1  9.8   Hematocrit 39.0 - 52.0 % 27.9  27.1  28.4   Platelets 150 - 400 K/uL 255  202  188       Latest Ref Rng & Units 02/25/2023    4:17 AM 02/24/2023    5:06 AM 02/23/2023    8:01 AM  CMP  Glucose 70 - 99 mg/dL 409  811  914   BUN 6 - 20 mg/dL 16  21  19    Creatinine 0.61 - 1.24 mg/dL 7.82  9.56  2.13   Sodium 135 - 145 mmol/L 131  136  134   Potassium 3.5 - 5.1 mmol/L 3.7  3.8  3.7   Chloride 98 - 111 mmol/L 103  106  104    CO2 22 - 32 mmol/L 22  23  23    Calcium 8.9 - 10.3 mg/dL 8.1  8.0  8.3   Total Protein 6.5 - 8.1 g/dL  5.7    Total Bilirubin 0.3 - 1.2 mg/dL  0.3    Alkaline Phos 38 - 126 U/L  93    AST 15 - 41 U/L  30    ALT 0 - 44 U/L  25      Imaging studies:  No new imaging studies   Assessment/Plan:  51 y.o. male with likely ileus 15 days s/p hand assisted laparoscopic right hemicolectomy and cholecystectomy for right colon cancer and cholelithiasis, complicated by pertinent comorbidities including developmental delay.    - Given rising leukocytosis will obtain CT Abdomen/Pelvis with PO/IV contrast this morning to ensure no missed intra-abdominal process. Abdomen is benign but unsure how to explain leukocytosis   - Continue CLD for now. If CT is reassuring, I think we can start advancing diet  - Consider anti-diarrheals today pending CT   - Continue PICC + TPN; at goal - Monitor abdominal examination; on-going bowel function - Monitor leukocytosis - Monitor renal function - Mobilize as tolerated   -  Further management per primary service; we will follow   - Discharge Planning: Anticipate patient staying through weekend  All of the above findings and recommendations were discussed with the patient, patient's family (mother at bedside), and the medical team, and all of their questions were answered to their expressed satisfaction.  -- Lynden Oxford, PA-C [facility] 02/25/2023, 7:30 AM M-F: 7am - 4pm

## 2023-02-26 ENCOUNTER — Encounter: Payer: Self-pay | Admitting: Pulmonary Disease

## 2023-02-26 DIAGNOSIS — R6521 Severe sepsis with septic shock: Secondary | ICD-10-CM | POA: Diagnosis not present

## 2023-02-26 DIAGNOSIS — A419 Sepsis, unspecified organism: Secondary | ICD-10-CM | POA: Diagnosis not present

## 2023-02-26 LAB — CBC WITH DIFFERENTIAL/PLATELET
Abs Immature Granulocytes: 1.25 10*3/uL — ABNORMAL HIGH (ref 0.00–0.07)
Basophils Absolute: 0.1 10*3/uL (ref 0.0–0.1)
Basophils Relative: 0 %
Eosinophils Absolute: 0.4 10*3/uL (ref 0.0–0.5)
Eosinophils Relative: 2 %
HCT: 29.3 % — ABNORMAL LOW (ref 39.0–52.0)
Hemoglobin: 10 g/dL — ABNORMAL LOW (ref 13.0–17.0)
Immature Granulocytes: 5 %
Lymphocytes Relative: 8 %
Lymphs Abs: 1.7 10*3/uL (ref 0.7–4.0)
MCH: 29.5 pg (ref 26.0–34.0)
MCHC: 34.1 g/dL (ref 30.0–36.0)
MCV: 86.4 fL (ref 80.0–100.0)
Monocytes Absolute: 1.8 10*3/uL — ABNORMAL HIGH (ref 0.1–1.0)
Monocytes Relative: 8 %
Neutro Abs: 17.9 10*3/uL — ABNORMAL HIGH (ref 1.7–7.7)
Neutrophils Relative %: 77 %
Platelets: 301 10*3/uL (ref 150–400)
RBC: 3.39 MIL/uL — ABNORMAL LOW (ref 4.22–5.81)
RDW: 13.5 % (ref 11.5–15.5)
WBC: 23.1 10*3/uL — ABNORMAL HIGH (ref 4.0–10.5)
nRBC: 0 % (ref 0.0–0.2)

## 2023-02-26 LAB — BASIC METABOLIC PANEL
Anion gap: 8 (ref 5–15)
BUN: 19 mg/dL (ref 6–20)
CO2: 22 mmol/L (ref 22–32)
Calcium: 8.6 mg/dL — ABNORMAL LOW (ref 8.9–10.3)
Chloride: 102 mmol/L (ref 98–111)
Creatinine, Ser: 0.65 mg/dL (ref 0.61–1.24)
GFR, Estimated: >60 mL/min (ref >60)
Glucose, Bld: 131 mg/dL — ABNORMAL HIGH (ref 70–99)
Potassium: 3.8 mmol/L (ref 3.5–5.1)
Sodium: 132 mmol/L — ABNORMAL LOW (ref 135–145)

## 2023-02-26 LAB — MAGNESIUM: Magnesium: 2.1 mg/dL (ref 1.7–2.4)

## 2023-02-26 LAB — STOOL CULTURE REFLEX - RSASHR

## 2023-02-26 MED ORDER — TRAVASOL 10 % IV SOLN
INTRAVENOUS | Status: AC
  Filled 2023-02-26: qty 1123.2

## 2023-02-26 NOTE — Progress Notes (Signed)
Progress Note   Patient: Alec Campbell ZOX:[id number] DOB: 1971/10/11 DOA: 02/19/2023     7 DOS: the patient was seen and examined on 02/26/2023     Subjective:  Patient seen and examined at bedside in the presence of his brother Leukocytosis worsened today CT scan of the abdomen did not show any worsening of abdominal findings Denies nausea vomiting chest pain or cough     Brief hospital course: "52 y.o male with significant PMH of mild mental retardation/developmental delay, seizure disorder, IDA, HLD,  bilateral Inguinal hernia, ascending colonic mass with tubular adenoma with high grade dysplasia, recent right hemicolectomy with stapled ileocolostomy and Laparoscopic cholecystectomy who presented to the ED with chief complaints of  diarrhea, nausea, vomiting, hiccups, fevers and chills x 7 days.  Patient underwent uncomplicated right-sided colectomy for an ascending colon mass and cholecystectomy for cholelithiasis and chronic cholecystitis by Dr. Everlene Farrier on 02/10/2023.  He was discharge don 02/11/2023 in stable condition. Per pt's mother, patient developed nausea, vomiting and diarrhea on Saturday, April 13.  Was seen by Dr. Everlene Farrier in the office on 02/16/2023 and 2 L of fluids were ordered for 02/17/2023.  Patient's mother reported new fever and 2 syncopal episodes and possible myoclonic jerks the night before presentation with brief LOC.  Unclear if episode was true seizures or not but per mother "he came back too quickly unlike his true seizure".      Pertinent Labs/Diagnostics Findings: Na+/ K+: 125/3.1  Glucose: 206 BUN/Cr.:36/1.145 WBC: Unremarkable   Lactic acid: 4.6 COVID PCR: Negative, Troponin:48   CT Abd/pelvis>multifocal pneumonia. Partial bowel obstruction and ?acute cystitis..." Pt was treated with broad spectrum IV antibiotics and IV fluids per sepsis protocol and admitted to ICU with septic shock requiring pressors.   General surgery following for SBO. TRH service assumed care  02/21/23.   Further hospital course and management as outlined below.   Assessment and Plan: Septic shock secondary to multifocal pneumonia-present on admission POA with SIRS criteria: Heart Rate 131 beats/minute, Respiratory Rate 30 breaths/minute,Temperature 103.2, lactic acid 4.6.  Admitted to ICU requiring vasopressors. Currently off vasopressor support --Septic shock & sepsis physiology improved -- Continue current antibiotics   Hypophosphatemia Refeeding syndrome Phos 1.4 02/22/2023 IV K-phos was given with improvement Currently on TPN Advancement of diet according to surgical recommendation  Chronic diarrhea Negative C diff and GI panel According to patient's family patient has been this chronic diarrhea for some time now  IV hydration From discussion with infectious disease this is not concerning for C. difficile since patient tested negative on presentation. Monitor electrolytes & replace PRN   Hypokalemia K 3.1 on admission Ongoing IV replacement with IV fluid additive and K riders. Monitor BMP, replace K as needed Monitor magnesium levels as well as potassium level   AKI (acute kidney injury) RESOLVED with fluids. Likely pre-renal from volume depletion / dehydration, poor PO intake.   Cr 1.45 on admission, baseline 1.03. 4/24: Cr 0.70 --Continue IV fluids --Monitor BMP   Multifocal pneumonia Vs Aspiration PNA and ? UTI Acute respiratory failure with hypoxia due to PNA - resolved Complicated by Septic Shock which has resolved.  Initially treated with IV Vanc / Cefepime / Flagyl --Now on IV Zosyn - continue --Maintenance IV hydration      SBO (small bowel obstruction) In the setting of recent right hemicolectomy and cholecystectomy. --Mgmt per General Surgery -- NG tube have been removed by surgeon ---Advancement of diet according to surgical recommendation -- Continue TPN through PICC line --Pain  control PRN --IV Valium PRN for restlessness / agitation /  anxiety -- to keep lines/tubes in place Repeat CT scan of the abdomen did not show worsening of abdominal pathology   Colonic mass S/P recent R hemicolectomy. (02/10/23) General surgery following, see SBO   Seizure disorder ? Syncope unclear if related to seizure or above issues --Seizures precaution --Stop IV Keppra (?causing agitation) --Discussed case with Neurologist --Resumed phenobarbital by IV --IV Depakote in place of Keppra --Closely monitor levels      Intellectual disability with epilepsy Additional support from family and staff as needed. Agitation -continue phenobarbital by IV, changing Keppra >> Depakote IV, hope for improvement. Qtc is normal.  IV Haldol PRN if agitated despite above changes.       Hyponatremia likely secondary to the hydration Na 133 >> 131 on 02/22/2023 Continue IV fluids and TPN Monitor BMP     Physical examination General exam: awake, alert, no acute distress, restless, in recliner HEENT: NG tube in place to wall suction, moist mucus membranes, hearing grossly normal  Respiratory system: CTAB, no wheezes, rales or rhonchi, normal respiratory effort. Cardiovascular system: normal S1/S2, RRR, regular rhythm Gastrointestinal system: soft, mildly distended, mid/lower tenderness without rebound or guarding, recent surgical scars healing well. Central nervous system:.exam limited by intellectual disability, does not follow commands, grossly non-focal Extremities: moves all, no edema, normal tone Skin: dry, intact, normal temperature Psychiatry: Normal mood       Data Reviewed: I reviewed patient's laboratory results showing sodium 132 potassium 3.7 with worsening leukocytosis WBC 23.21 hemoglobin 10     Family Communication: Patient's mother present at bedside   Disposition: Status is: Inpatient Still continues to meet inpatient criteria given acute illness requiring TPN and surgical consultation   Time spent: 40 minutes        Vitals:   02/25/23 1532 02/25/23 2004 02/26/23 0501 02/26/23 0721  BP: (!) 147/81 (!) 160/80 (!) 147/86 (!) 144/77  Pulse: 99 91 100 95  Resp: 18 20 16    Temp: 100.3 F (37.9 C) 98 F (36.7 C) 98.9 F (37.2 C) 98.9 F (37.2 C)  TempSrc: Axillary Oral Oral Oral  SpO2: 97% 100% 96% 97%  Weight:      Height:        Author: Loyce Dys, MD 02/26/2023 2:13 PM  For on call review [email or web address].

## 2023-02-26 NOTE — Progress Notes (Signed)
Patient ID: Alec Campbell, male   DOB: 1971-09-23, 52 y.o.   MRN: [id number]     SURGICAL PROGRESS NOTE   Hospital Day(s): 7.   Interval History: Patient seen and examined, no acute events or new complaints overnight. Patient reports feeling okay.  He is sitting down on the recliner and answering simple questions.  He is a comfortable.  As per nurse he has had couple bowel movement since yesterday.  There has been no issues with nausea.  Unsure of the amount of food that he is eating.  Vital signs in last 24 hours: [min-max] current  Temp:  [98 F (36.7 C)-100.3 F (37.9 C)] 98.9 F (37.2 C) (04/27 0721) Pulse Rate:  [91-100] 95 (04/27 0721) Resp:  [16-20] 16 (04/27 0501) BP: (144-160)/(77-86) 144/77 (04/27 0721) SpO2:  [96 %-100 %] 97 % (04/27 0721) Weight:  [69.9 kg] 69.9 kg (04/26 1000)     Height: 5\' 10"  (177.8 cm) Weight: 69.9 kg BMI (Calculated): 22.11   Physical Exam:  Constitutional: alert, cooperative and no distress  Respiratory: breathing non-labored at rest  Cardiovascular: regular rate and sinus rhythm  Gastrointestinal: soft, non-tender, and non-distended.  Wounds are dry and clean  Labs:     Latest Ref Rng & Units 02/26/2023    5:30 AM 02/25/2023    4:17 AM 02/24/2023    5:06 AM  CBC  WBC 4.0 - 10.5 K/uL 23.1  18.8  14.4   Hemoglobin 13.0 - 17.0 g/dL 40.9  9.5  9.1   Hematocrit 39.0 - 52.0 % 29.3  27.9  27.1   Platelets 150 - 400 K/uL 301  255  202       Latest Ref Rng & Units 02/26/2023    5:30 AM 02/25/2023    4:17 AM 02/24/2023    5:06 AM  CMP  Glucose 70 - 99 mg/dL 811  914  782   BUN 6 - 20 mg/dL 19  16  21    Creatinine 0.61 - 1.24 mg/dL 9.56  2.13  0.86   Sodium 135 - 145 mmol/L 132  131  136   Potassium 3.5 - 5.1 mmol/L 3.8  3.7  3.8   Chloride 98 - 111 mmol/L 102  103  106   CO2 22 - 32 mmol/L 22  22  23    Calcium 8.9 - 10.3 mg/dL 8.6  8.1  8.0   Total Protein 6.5 - 8.1 g/dL   5.7   Total Bilirubin 0.3 - 1.2 mg/dL   0.3   Alkaline Phos 38 - 126 U/L    93   AST 15 - 41 U/L   30   ALT 0 - 44 U/L   25     Imaging studies: I personally evaluated the CT scan of the abdomen and pelvis done yesterday.  Improved with small bowel dilation.  No sign of intra-abdominal infection.   Assessment/Plan:  52 y.o. male with resolving postoperative ileus most likely due to multifocal pneumonia, possible viral illness.  -Abdominal exam benign with soft abdomen, nontender. -Patient has had multiple bowel movements including diarrhea.  As per chart patient has had a chronic diarrhea in the past. -Unsure the amount of oral intake.  Will advance to soft diet.  Continue TPN for today.  If he tolerates soft diet might start decreasing TPN tomorrow. -Continue antibiotic therapy and medical management as per hospitalist -Will continue to follow along.  Gae Gallop, MD

## 2023-02-26 NOTE — Progress Notes (Signed)
PHARMACY - TOTAL PARENTERAL NUTRITION CONSULT NOTE   Indication: Prolonged ileus  Patient Measurements: Height: 5\' 10"  (177.8 cm) Weight: 69.9 kg (154 lb 1.6 oz) IBW/kg (Calculated) : 73 TPN AdjBW (KG): 76.7 Body mass index is 22.11 kg/m.  Assessment:  52 y.o. male w/ PMH of seizure disorder, IDA, HLD, bilateral Inguinal hernia, ascending colonic mass with tubular adenoma with high grade dysplasia, recent right hemicolectomy admitted on 02/19/2023 with sepsis.   Glucose / Insulin:  --No history of DM --Sliding scale insulin discontinued --Blood sugar is controlled Electrolytes: Hyponatremia Renal: Scr < 1 Hepatic: LFTs within normal limits. No transaminitis. Mild hypertriglyceridemia Intake / Output: I/O net (+) 2.2L GI Imaging:  KUB (02/20/2022): Shows dilated loops of small bowel in RLQ; there is gas in colon, no gross pneumoperitoneum  GI Surgeries / Procedures: s/p hand assisted laparoscopic right hemicolectomy and cholecystectomy   Central access: 02/21/23 TPN start date: 02/21/23  Nutritional Goals: Goal TPN rate is 90 mL/hr (provides 112 g of protein and 2468 kcals per day)  RD Assessment: Estimated Needs Total Energy Estimated Needs: 2200-2500kcal/day Total Protein Estimated Needs: 110-125g/day Total Fluid Estimated Needs: 2.3-2.6L/day  Current Nutrition:  Full liquids  Plan:  --Continue TPN at goal rate of  90 mL/hr  --Electrolytes in TPN: Na 73mEq/L, K 44mEq/L, Ca 23mEq/L, Mg 39mEq/L, and Phos 19mmol/L. Cl:Ac 1:1 --Add standard MVI, thiamine, and trace elements to TPN --Monitor TPN labs on Mon/Thurs or as clinically indicated  Tressie Ellis 02/26/2023,9:37 AM

## 2023-02-27 DIAGNOSIS — R6521 Severe sepsis with septic shock: Secondary | ICD-10-CM | POA: Diagnosis not present

## 2023-02-27 DIAGNOSIS — A419 Sepsis, unspecified organism: Secondary | ICD-10-CM | POA: Diagnosis not present

## 2023-02-27 LAB — STOOL CULTURE REFLEX - CMPCXR

## 2023-02-27 LAB — CBC WITH DIFFERENTIAL/PLATELET
Abs Immature Granulocytes: 1.18 10*3/uL — ABNORMAL HIGH (ref 0.00–0.07)
Basophils Absolute: 0.1 10*3/uL (ref 0.0–0.1)
Basophils Relative: 1 %
Eosinophils Absolute: 0.3 10*3/uL (ref 0.0–0.5)
Eosinophils Relative: 2 %
HCT: 30.5 % — ABNORMAL LOW (ref 39.0–52.0)
Hemoglobin: 10.2 g/dL — ABNORMAL LOW (ref 13.0–17.0)
Immature Granulocytes: 6 %
Lymphocytes Relative: 9 %
Lymphs Abs: 1.7 10*3/uL (ref 0.7–4.0)
MCH: 29 pg (ref 26.0–34.0)
MCHC: 33.4 g/dL (ref 30.0–36.0)
MCV: 86.6 fL (ref 80.0–100.0)
Monocytes Absolute: 1.1 10*3/uL — ABNORMAL HIGH (ref 0.1–1.0)
Monocytes Relative: 6 %
Neutro Abs: 14.4 10*3/uL — ABNORMAL HIGH (ref 1.7–7.7)
Neutrophils Relative %: 76 %
Platelets: 244 10*3/uL (ref 150–400)
RBC: 3.52 MIL/uL — ABNORMAL LOW (ref 4.22–5.81)
RDW: 13.6 % (ref 11.5–15.5)
Smear Review: NORMAL
WBC: 18.8 10*3/uL — ABNORMAL HIGH (ref 4.0–10.5)
nRBC: 0 % (ref 0.0–0.2)

## 2023-02-27 MED ORDER — TRAVASOL 10 % IV SOLN
INTRAVENOUS | Status: AC
  Filled 2023-02-27: qty 561.6

## 2023-02-27 NOTE — Plan of Care (Signed)
Very poor oral appetite. RN and family trying to encourage intake. Possibly due to cognitive delay patient needs a lot of reinforcement and cues.   Problem: Activity: Goal: Risk for activity intolerance will decrease Outcome: Not Met (add Reason)   Problem: Nutrition: Goal: Adequate nutrition will be maintained Outcome: Not Met (add Reason)

## 2023-02-27 NOTE — Progress Notes (Signed)
PHARMACY - TOTAL PARENTERAL NUTRITION CONSULT NOTE   Indication: Prolonged ileus  Patient Measurements: Height: 5\' 10"  (177.8 cm) Weight: 67.6 kg (149 lb 0.5 oz) IBW/kg (Calculated) : 73 TPN AdjBW (KG): 76.7 Body mass index is 21.38 kg/m.  Assessment:  52 y.o. male w/ PMH of seizure disorder, IDA, HLD, bilateral Inguinal hernia, ascending colonic mass with tubular adenoma with high grade dysplasia, recent right hemicolectomy admitted on 02/19/2023 with sepsis.   Glucose / Insulin:  --No history of DM --Sliding scale insulin discontinued --Blood sugar is controlled Electrolytes: Hyponatremia Renal: Scr < 1 Hepatic: LFTs within normal limits. No transaminitis. Mild hypertriglyceridemia Intake / Output: I/O net (+) 4L GI Imaging:  KUB (02/20/2022): Shows dilated loops of small bowel in RLQ; there is gas in colon, no gross pneumoperitoneum  GI Surgeries / Procedures: s/p hand assisted laparoscopic right hemicolectomy and cholecystectomy   Central access: 02/21/23 TPN start date: 02/21/23  Nutritional Goals: Goal TPN rate is 90 mL/hr (provides 112 g of protein and 2468 kcals per day)  RD Assessment: Estimated Needs Total Energy Estimated Needs: 2200-2500kcal/day Total Protein Estimated Needs: 110-125g/day Total Fluid Estimated Needs: 2.3-2.6L/day  Current Nutrition:  Full liquids  Plan:  --Will wean TPN to half rate 45 mL/hr. --Electrolytes in TPN: Na 22mEq/L, K 21mEq/L, Ca 10mEq/L, Mg 19mEq/L, and Phos 36mmol/L. Cl:Ac 1:1. --Continue standard MVI, thiamine, and trace elements to TPN --Monitor TPN labs on Mon/Thurs or as clinically indicated  Alec Campbell 02/27/2023,11:04 AM

## 2023-02-27 NOTE — TOC Progression Note (Signed)
Transition of Care [facility]) - Progression Note    Patient Details  Name: Alec Campbell MRN: [id number] Date of Birth: 07/14/1971  Transition of Care [facility]) CM/SW Contact  Carmina Miller, LCSWA Phone Number: 02/27/2023, 9:25 AM  Clinical Narrative:     CSW spoke with pt's mother concerning incontinence supplies, she is agreeable, MD made aware DME order needs to be placed for supplies. TOC will continue to follow.        Expected Discharge Plan and Services                                               Social Determinants of Health (SDOH) Interventions SDOH Screenings   Food Insecurity: No Food Insecurity (02/19/2023)  Housing: Low Risk  (02/19/2023)  Transportation Needs: No Transportation Needs (02/19/2023)  Utilities: Not At Risk (02/19/2023)  Alcohol Screen: Low Risk  (04/15/2022)  Depression (PHQ2-9): Low Risk  (01/13/2023)  Financial Resource Strain: Low Risk  (04/15/2022)  Physical Activity: Insufficiently Active (04/15/2022)  Social Connections: Socially Isolated (04/15/2022)  Stress: No Stress Concern Present (04/15/2022)  Tobacco Use: Low Risk  (02/26/2023)    Readmission Risk Interventions     No data to display

## 2023-02-27 NOTE — Progress Notes (Signed)
Patient ID: Alec Campbell, male   DOB: 25-Aug-1971, 52 y.o.   MRN: [id number]     SURGICAL PROGRESS NOTE   Hospital Day(s): 8.   Interval History: Patient seen and examined, no acute events or new complaints overnight. Patient reports endorses feeling well.  He denies any complaint.  He repeatedly mentioned that he wants to go home.  As per caregiver at bedside patient has not been eating more than half of his diet.  No nausea or vomiting.  Having good bowel movements.  Vital signs in last 24 hours: [min-max] current  Temp:  [97.8 F (36.6 C)-98.6 F (37 C)] 97.8 F (36.6 C) (04/28 0730) Pulse Rate:  [95-107] 107 (04/28 0730) Resp:  [16] 16 (04/28 0730) BP: (135-152)/(83-84) 138/83 (04/28 0730) SpO2:  [95 %-98 %] 95 % (04/28 0730) Weight:  [67.6 kg] 67.6 kg (04/28 1019)     Height: 5\' 10"  (177.8 cm) Weight: 67.6 kg BMI (Calculated): 21.38   Physical Exam:  Constitutional: alert, cooperative and no distress  Respiratory: breathing non-labored at rest  Cardiovascular: regular rate and sinus rhythm  Gastrointestinal: soft, non-tender, and non-distended  Labs:     Latest Ref Rng & Units 02/27/2023    9:10 AM 02/26/2023    5:30 AM 02/25/2023    4:17 AM  CBC  WBC 4.0 - 10.5 K/uL 18.8  23.1  18.8   Hemoglobin 13.0 - 17.0 g/dL 40.9  81.1  9.5   Hematocrit 39.0 - 52.0 % 30.5  29.3  27.9   Platelets 150 - 400 K/uL 244  301  255       Latest Ref Rng & Units 02/26/2023    5:30 AM 02/25/2023    4:17 AM 02/24/2023    5:06 AM  CMP  Glucose 70 - 99 mg/dL 914  782  956   BUN 6 - 20 mg/dL 19  16  21    Creatinine 0.61 - 1.24 mg/dL 2.13  0.86  5.78   Sodium 135 - 145 mmol/L 132  131  136   Potassium 3.5 - 5.1 mmol/L 3.8  3.7  3.8   Chloride 98 - 111 mmol/L 102  103  106   CO2 22 - 32 mmol/L 22  22  23    Calcium 8.9 - 10.3 mg/dL 8.6  8.1  8.0   Total Protein 6.5 - 8.1 g/dL   5.7   Total Bilirubin 0.3 - 1.2 mg/dL   0.3   Alkaline Phos 38 - 126 U/L   93   AST 15 - 41 U/L   30   ALT 0 - 44 U/L    25     Imaging studies: No new pertinent imaging studies   Assessment/Plan:  52 y.o. male with resolving postoperative ileus most likely due to multifocal pneumonia, possible viral illness.   -No clinical sideration since yesterday. -Adequate vital signs -Abdominal physical exam is benign.  Patient is tolerating small amount of diet and having bowel movement. -Will decrease TPN to half today.  Hopefully it can be discontinued tomorrow if patient continues to eat better.  Will add Ensure as a supplement. -Continue IV antibiotic therapy -Will continue to follow  Gae Gallop, MD

## 2023-02-27 NOTE — Progress Notes (Signed)
RN and patient's mother assisted walking patient two laps around nurses station. Patient tolerated very well. RN reminded patient that in order to go home must walk and increase oral intake.    02/27/23 1500  Mobility  Activity Ambulated with assistance in hallway  Range of Motion/Exercises Active;All extremities  Level of Assistance Modified independent, requires aide device or extra time  Assistive Device None  Distance Ambulated (ft) 320 ft  Activity Response Tolerated well

## 2023-02-27 NOTE — Progress Notes (Signed)
Progress Note   Patient: Alec Campbell ZOX:[id number] DOB: 1971-07-05 DOA: 02/19/2023     8 DOS: the patient was seen and examined on 02/27/2023    Subjective:  Patient seen and examined at bedside in the presence of his mother Leukocytosis improved today He tells me he wants to be discharged Mother tells me diarrhea and  abdominal pain is improving Denies nausea vomiting chest pain or cough     Brief hospital course: "52 y.o male with significant PMH of mild mental retardation/developmental delay, seizure disorder, IDA, HLD,  bilateral Inguinal hernia, ascending colonic mass with tubular adenoma with high grade dysplasia, recent right hemicolectomy with stapled ileocolostomy and Laparoscopic cholecystectomy who presented to the ED with chief complaints of  diarrhea, nausea, vomiting, hiccups, fevers and chills x 7 days.  Patient underwent uncomplicated right-sided colectomy for an ascending colon mass and cholecystectomy for cholelithiasis and chronic cholecystitis by Dr. Everlene Farrier on 02/10/2023.  He was discharge don 02/11/2023 in stable condition. Per pt's mother, patient developed nausea, vomiting and diarrhea on Saturday, April 13.  Was seen by Dr. Everlene Farrier in the office on 02/16/2023 and 2 L of fluids were ordered for 02/17/2023.  Patient's mother reported new fever and 2 syncopal episodes and possible myoclonic jerks the night before presentation with brief LOC.  Unclear if episode was true seizures or not but per mother "he came back too quickly unlike his true seizure".      Pertinent Labs/Diagnostics Findings: Na+/ K+: 125/3.1  Glucose: 206 BUN/Cr.:36/1.145 WBC: Unremarkable   Lactic acid: 4.6 COVID PCR: Negative, Troponin:48   CT Abd/pelvis>multifocal pneumonia. Partial bowel obstruction and ?acute cystitis..." Pt was treated with broad spectrum IV antibiotics and IV fluids per sepsis protocol and admitted to ICU with septic shock requiring pressors.  General surgery following for SBO. TR[facility]  service assumed care 02/21/23.   Further hospital course and management as outlined below.   Assessment and Plan: Septic shock secondary to multifocal pneumonia-present on admission POA with SIRS criteria: Heart Rate 131 beats/minute, Respiratory Rate 30 breaths/minute,Temperature 103.2, lactic acid 4.6.  Admitted to ICU requiring vasopressors. Currently off vasopressor support --Septic shock & sepsis physiology improved -- Continue current antibiotics   Hypophosphatemia Refeeding syndrome Phos 1.4 02/22/2023 IV K-phos was given with improvement Currently on TPN Advancement of diet according to surgical recommendation   Chronic diarrhea Negative C diff and GI panel According to patient's family patient has been this chronic diarrhea for some time now  IV hydration From discussion with infectious disease this is not concerning for C. difficile since patient tested negative on presentation. Monitor electrolytes & replace PRN   Hypokalemia K 3.1 on admission Ongoing IV replacement with IV fluid additive and K riders. Monitor BMP, replace K as needed Monitor magnesium levels as well as potassium level   AKI (acute kidney injury) RESOLVED with fluids. Likely pre-renal from volume depletion / dehydration, poor PO intake.   Cr 1.45 on admission, baseline 1.03. 4/24: Cr 0.70 --Continue IV fluids --Monitor BMP   Multifocal pneumonia Vs Aspiration PNA and ? UTI Acute respiratory failure with hypoxia due to PNA - resolved Complicated by Septic Shock which has resolved.  Initially treated with IV Vanc / Cefepime / Flagyl --Now on IV Zosyn - continue --Maintenance IV hydration      SBO (small bowel obstruction) In the setting of recent right hemicolectomy and cholecystectomy. --Mgmt per General Surgery -- NG tube have been removed by surgeon ---Advancement of diet according to surgical recommendation -- Continue TPN  through PICC line --Pain control PRN --IV Valium PRN for  restlessness / agitation / anxiety -- to keep lines/tubes in place Repeat CT scan of the abdomen did not show worsening of abdominal pathology   Colonic mass S/P recent R hemicolectomy. (02/10/23) General surgery following, see SBO   Seizure disorder ? Syncope unclear if related to seizure or above issues --Seizures precaution --Stop IV Keppra (?causing agitation) --Discussed case with Neurologist --Resumed phenobarbital by IV --IV Depakote in place of Keppra --Closely monitor levels      Intellectual disability with epilepsy Additional support from family and staff as needed. Agitation -continue phenobarbital by IV, changing Keppra >> Depakote IV, hope for improvement. Qtc is normal.  IV Haldol PRN if agitated despite above changes.       Hyponatremia likely secondary to the hydration Na 133 >> 131 on 02/22/2023 Continue IV fluids and TPN Monitor BMP     Physical examination General exam: awake, alert, no acute distress, restless, in recliner HEENT: NG tube in place to wall suction, moist mucus membranes, hearing grossly normal  Respiratory system: CTAB, no wheezes, rales or rhonchi, normal respiratory effort. Cardiovascular system: normal S1/S2, RRR, regular rhythm Gastrointestinal system: soft, mildly distended, mid/lower tenderness without rebound or guarding, recent surgical scars healing well. Central nervous system:.exam limited by intellectual disability, does not follow commands, grossly non-focal Extremities: moves all, no edema, normal tone Skin: dry, intact, normal temperature Psychiatry: Normal mood       Data Reviewed: I reviewed patient's laboratory results showing WBC 18.8 hemoglobin 10.2     Family Communication: Patient's mother present at bedside   Disposition: Status is: Inpatient Still continues to meet inpatient criteria given acute illness requiring TPN and surgical consultation   Time spent: 42 minutes      Vitals:   02/27/23 0730 02/27/23  0730 02/27/23 0730 02/27/23 1019  BP: 138/83 138/83 138/83   Pulse: (!) 107 (!) 107 (!) 107   Resp: 16  16   Temp: 97.8 F (36.6 C) 97.8 F (36.6 C) 97.8 F (36.6 C)   TempSrc: Axillary     SpO2: 95% 95% 95%   Weight:    67.6 kg  Height:        Author: Loyce Dys, MD 02/27/2023 1:45 PM  For on call review [email or web address].

## 2023-02-28 ENCOUNTER — Encounter: Payer: 59 | Admitting: Surgery

## 2023-02-28 ENCOUNTER — Inpatient Hospital Stay: Payer: 59

## 2023-02-28 DIAGNOSIS — D72829 Elevated white blood cell count, unspecified: Secondary | ICD-10-CM

## 2023-02-28 DIAGNOSIS — K567 Ileus, unspecified: Secondary | ICD-10-CM

## 2023-02-28 DIAGNOSIS — J181 Lobar pneumonia, unspecified organism: Secondary | ICD-10-CM

## 2023-02-28 DIAGNOSIS — K1370 Unspecified lesions of oral mucosa: Secondary | ICD-10-CM

## 2023-02-28 DIAGNOSIS — A419 Sepsis, unspecified organism: Secondary | ICD-10-CM | POA: Diagnosis not present

## 2023-02-28 DIAGNOSIS — R6521 Severe sepsis with septic shock: Secondary | ICD-10-CM | POA: Diagnosis not present

## 2023-02-28 LAB — COMPREHENSIVE METABOLIC PANEL
ALT: 32 U/L (ref 0–44)
AST: 32 U/L (ref 15–41)
Albumin: 2.9 g/dL — ABNORMAL LOW (ref 3.5–5.0)
Alkaline Phosphatase: 172 U/L — ABNORMAL HIGH (ref 38–126)
Anion gap: 9 (ref 5–15)
BUN: 21 mg/dL — ABNORMAL HIGH (ref 6–20)
CO2: 21 mmol/L — ABNORMAL LOW (ref 22–32)
Calcium: 8.8 mg/dL — ABNORMAL LOW (ref 8.9–10.3)
Chloride: 99 mmol/L (ref 98–111)
Creatinine, Ser: 0.69 mg/dL (ref 0.61–1.24)
GFR, Estimated: >60 mL/min (ref >60)
Glucose, Bld: 131 mg/dL — ABNORMAL HIGH (ref 70–99)
Potassium: 4.2 mmol/L (ref 3.5–5.1)
Sodium: 129 mmol/L — ABNORMAL LOW (ref 135–145)
Total Bilirubin: 0.5 mg/dL (ref 0.3–1.2)
Total Protein: 6.8 g/dL (ref 6.5–8.1)

## 2023-02-28 LAB — CBC WITH DIFFERENTIAL/PLATELET
Abs Immature Granulocytes: 1.1 10*3/uL — ABNORMAL HIGH (ref 0.00–0.07)
Basophils Absolute: 0.2 10*3/uL — ABNORMAL HIGH (ref 0.0–0.1)
Basophils Relative: 1 %
Eosinophils Absolute: 0.3 10*3/uL (ref 0.0–0.5)
Eosinophils Relative: 1 %
HCT: 29.7 % — ABNORMAL LOW (ref 39.0–52.0)
Hemoglobin: 10.1 g/dL — ABNORMAL LOW (ref 13.0–17.0)
Immature Granulocytes: 4 %
Lymphocytes Relative: 6 %
Lymphs Abs: 1.9 10*3/uL (ref 0.7–4.0)
MCH: 29.4 pg (ref 26.0–34.0)
MCHC: 34 g/dL (ref 30.0–36.0)
MCV: 86.3 fL (ref 80.0–100.0)
Monocytes Absolute: 1.8 10*3/uL — ABNORMAL HIGH (ref 0.1–1.0)
Monocytes Relative: 6 %
Neutro Abs: 26.1 10*3/uL — ABNORMAL HIGH (ref 1.7–7.7)
Neutrophils Relative %: 82 %
Platelets: 257 10*3/uL (ref 150–400)
RBC: 3.44 MIL/uL — ABNORMAL LOW (ref 4.22–5.81)
RDW: 13.6 % (ref 11.5–15.5)
WBC: 31.4 10*3/uL — ABNORMAL HIGH (ref 4.0–10.5)
nRBC: 0 % (ref 0.0–0.2)

## 2023-02-28 LAB — TRIGLYCERIDES: Triglycerides: 163 mg/dL — ABNORMAL HIGH (ref <150)

## 2023-02-28 LAB — MAGNESIUM: Magnesium: 1.7 mg/dL (ref 1.7–2.4)

## 2023-02-28 LAB — PHOSPHORUS: Phosphorus: 3.6 mg/dL (ref 2.5–4.6)

## 2023-02-28 MED ORDER — ACETAMINOPHEN 325 MG PO TABS
650.0000 mg | ORAL_TABLET | Freq: Once | ORAL | Status: AC
  Administered 2023-02-28: 650 mg via ORAL
  Filled 2023-02-28: qty 2

## 2023-02-28 MED ORDER — IOHEXOL 300 MG/ML  SOLN
100.0000 mL | Freq: Once | INTRAMUSCULAR | Status: AC | PRN
  Administered 2023-02-28: 100 mL via INTRAVENOUS

## 2023-02-28 MED ORDER — SODIUM CHLORIDE 0.9 % IV BOLUS
1000.0000 mL | Freq: Once | INTRAVENOUS | Status: AC
  Administered 2023-02-28: 1000 mL via INTRAVENOUS

## 2023-02-28 MED ORDER — METOPROLOL TARTRATE 5 MG/5ML IV SOLN
5.0000 mg | Freq: Once | INTRAVENOUS | Status: AC
  Administered 2023-02-28: 5 mg via INTRAVENOUS
  Filled 2023-02-28: qty 5

## 2023-02-28 MED ORDER — BACITRACIN ZINC 500 UNIT/GM EX OINT
TOPICAL_OINTMENT | CUTANEOUS | Status: DC | PRN
  Filled 2023-02-28: qty 0.9

## 2023-02-28 MED ORDER — MORPHINE SULFATE (PF) 2 MG/ML IV SOLN
2.0000 mg | INTRAVENOUS | Status: DC | PRN
  Administered 2023-02-28: 2 mg via INTRAVENOUS
  Administered 2023-02-28: 4 mg via INTRAVENOUS
  Administered 2023-02-28 – 2023-03-01 (×3): 2 mg via INTRAVENOUS
  Filled 2023-02-28 (×2): qty 1
  Filled 2023-02-28: qty 2
  Filled 2023-02-28 (×2): qty 1

## 2023-02-28 MED ORDER — ALBUMIN HUMAN 25 % IV SOLN
25.0000 g | Freq: Once | INTRAVENOUS | Status: AC
  Administered 2023-02-28: 25 g via INTRAVENOUS
  Filled 2023-02-28: qty 100

## 2023-02-28 MED ORDER — VALACYCLOVIR HCL 500 MG PO TABS
1000.0000 mg | ORAL_TABLET | Freq: Two times a day (BID) | ORAL | Status: DC
  Administered 2023-02-28 – 2023-03-03 (×7): 1000 mg via ORAL
  Filled 2023-02-28 (×7): qty 2

## 2023-02-28 MED ORDER — OXYCODONE HCL 5 MG PO TABS
5.0000 mg | ORAL_TABLET | ORAL | Status: DC | PRN
  Administered 2023-02-28 – 2023-03-01 (×2): 5 mg via ORAL
  Filled 2023-02-28 (×3): qty 1

## 2023-02-28 MED ORDER — TRAVASOL 10 % IV SOLN
INTRAVENOUS | Status: AC
  Filled 2023-02-28: qty 561.6

## 2023-02-28 MED ORDER — BACITRACIN ZINC 500 UNIT/GM EX OINT: TOPICAL_OINTMENT | Freq: Two times a day (BID) | CUTANEOUS | Status: DC

## 2023-02-28 NOTE — Consult Note (Addendum)
[facility]    Date of Admission:  02/19/2023   Total days of inpatient antibiotics         Reason for Consult:     Principal Problem:   Septic shock (HCC) Active Problems:   Hyponatremia   Intellectual disability with epilepsy (HCC)   Seizure disorder (HCC)   Colonic mass   SBO (small bowel obstruction) (HCC)   Multifocal pneumonia   AKI (acute kidney injury) (HCC)   Hypokalemia   Diarrhea   Hypophosphatemia   Assessment: 52 year old male admitted with of ascending colon mass status post right-sided lap hand- assisted colectomy and cholecystectomy for cholelithiasis and chronic cholecystitis on 4/11 with Dr. Everlene Farrier. Did well post =op until he developed diarrhea and emesis. Admitted for aspiration pneumonia with diarrhea.  Infectious disease engaged and patient had worsening abdominal pain and leukocytosis at 31 K.  #Leukocytosis #Ileus #Soft stools Stool studies including C. difficile, GIP, stool ova parasite negative on 4/21, blood and urine cultures negative.  Patient's parents report that patient is having pudding consistency stools, last bowel movement was around 4 PM yesterday.  They deny patient is having watery stools.   His abdominal pain worsened this morning.  I looked at the wound sites in the abdomen and and were draining, no surrounding fluid erythema. CT abdomen pelvis today showed respiratory motion artifact throughout CT, similar mild diffuse ileus pattern involving colon.  Slight improvement in posterior bibasilar pulmonary consolidative opacity likely representing atelectasis, stable bilateral inguinal hernias containing fat right greater than left. Recommendations:  -Okay to continue for pip-tazo for now - I do not think this is related to diarrheal illness, suspect anatomical issue ie  stricture/block  perhaps not visualized on imaging as it was motion degraded - Spoke with general surgery, the plan is for Dr. Kerry Hough to  evaluate patient as of now. - Plan relayed to primary   #Pneumonia - CT on arrival showed bilateral lower lobe intra for trace consistent with multifocal pneumonia - Respiratory status now is at baseline, patient is on on room air. He  has been on pip-tazo for about 10 days.  And considered treated for pneumonia      #Oral lesion consistent with oral herpes - Will get oral swab - Start Valtrex as patient has a history of oral herpes.  Preparing to   I have personally spent 110 minutes involved in face-to-face and non-face-to-face activities for this patient on the day of the visit. Professional time spent includes the following activities: Preparing to see the patient (review of tests), Obtaining and/or reviewing separately obtained history (admission/discharge record), Performing a medically appropriate examination and/or evaluation , Ordering medications/tests/procedures, referring and communicating with other health care professionals, Documenting clinical information in the EMR, Independently interpreting results (not separately reported), Communicating results to the patient/family/caregiver, Counseling and educating the patient/family/caregiver and Care coordination (not separately reported).   Microbiology:   Antibiotics: Cefepime 4/19 Metronidazole 4/19 Vancomycin 4/19 Pip-tazo 4/20-present  Cultures: Blood /20 no growth Urine 4/21 no growth Other 4/21 C. difficile toxin/antigen negative GIP negative Stool O&P negative  HPI: Alec Campbell is a 52 y.o. male with past medical history of developmental delay, seizure disorder, IDA, hyperlipidemia, bilateral inguinal hernia, ascending colonic mass with tubular adenoma and high-grade dysplasia, recent right hemicolectomy with stapled ileocolostomy and lap chole admitted with acute respiratory failure due to multifocal pneumonia, suspected aspiration due to seizure disorder, diarrhea.  He was started on pip-tazo.  CT chest abdomen  pelvis showed bilateral lower lobe and middle lobe infiltrates consistent multifocal pneumonia, partial SBO, bilateral fat stranding and inguinal hernias, fluid-filled scrotal hernia, diffuse urinary bladder wall thickening may be secondary to chronic bladder outlet obstruction. - Workup including stool culture, O&P, C. difficile and GIP were negative urine cultures negative, blood cultures negative Infectious disease engaged if patient had worsening abdominal pain  Review of Systems: Review of Systems  All other systems reviewed and are negative.   Past Medical History:  Diagnosis Date   Allergy    Moderate mental retardation    Seizures (HCC)    Vitamin D deficiency     Social History   Tobacco Use   Smoking status: Never    Passive exposure: Never   Smokeless tobacco: Never  Vaping Use   Vaping Use: Never used  Substance Use Topics   Alcohol use: No    Alcohol/week: 0.0 standard drinks of alcohol   Drug use: No    Family History  Problem Relation Age of Onset   Hypertension Mother    Thyroid disease Mother    CAD Father    Hypertension Father    Hyperlipidemia Father    Healthy Brother    Healthy Sister    Diabetes Brother    COPD Brother    Emphysema Brother    Alcohol abuse Brother    Hypertension Brother    Aneurysm Brother        brain   Scheduled Meds:  carbamazepine  200 mg Oral BID   carbamazepine  400 mg Oral q AM   Chlorhexidine Gluconate Cloth  6 each Topical Daily   enoxaparin (LOVENOX) injection  40 mg Subcutaneous QHS   loperamide  2 mg Oral BID   metoprolol tartrate  5 mg Intravenous Once   mouth rinse  15 mL Mouth Rinse 4 times per day   phenobarbital  64.8 mg Oral BID   sodium chloride flush  10-40 mL Intracatheter Q12H   Continuous Infusions:  sodium chloride Stopped (02/27/23 2201)   piperacillin-tazobactam (ZOSYN)  IV 3.375 g (02/28/23 0509)   TPN ADULT (ION) 45 mL/hr at 02/28/23 0020   TPN ADULT (ION)     PRN Meds:.bacitracin,  diazepam, docusate sodium, haloperidol lactate, ipratropium-albuterol, labetalol, liver oil-zinc oxide, morphine injection, mouth rinse, polyethylene glycol, sodium chloride flush No Known Allergies  OBJECTIVE: Blood pressure (!) 150/139, pulse (!) 130, temperature 98 F (36.7 C), resp. rate 20, height 5\' 10"  (1.778 m), weight 70.5 kg, SpO2 94 %.  Physical Exam Constitutional:      General: He is not in acute distress.    Appearance: He is normal weight. He is not toxic-appearing.  HENT:     Head: Normocephalic and atraumatic.     Right Ear: External ear normal.     Left Ear: External ear normal.     Nose: No congestion or rhinorrhea.     Mouth/Throat:     Mouth: Mucous membranes are moist.     Pharynx: Oropharynx is clear.  Eyes:     Extraocular Movements: Extraocular movements intact.     Conjunctiva/sclera: Conjunctivae normal.     Pupils: Pupils are equal, round, and reactive to light.  Cardiovascular:     Rate and Rhythm: Normal rate and regular rhythm.     Heart sounds: No murmur heard.    No friction rub. No gallop.  Pulmonary:     Effort: Pulmonary effort is normal.     Breath sounds: Normal  breath sounds.  Abdominal:     General: Abdomen is flat. Bowel sounds are normal.     Palpations: Abdomen is soft.     Comments: Post op wound C/D/I  Musculoskeletal:        General: No swelling. Normal range of motion.     Cervical back: Normal range of motion and neck supple.  Skin:    General: Skin is warm and dry.  Neurological:     General: No focal deficit present.     Mental Status: He is oriented to person, place, and time.  Psychiatric:        Mood and Affect: Mood normal.     Lab Results Lab Results  Component Value Date   WBC 31.4 (H) 02/28/2023   HGB 10.1 (L) 02/28/2023   HCT 29.7 (L) 02/28/2023   MCV 86.3 02/28/2023   PLT 257 02/28/2023    Lab Results  Component Value Date   CREATININE 0.69 02/28/2023   BUN 21 (H) 02/28/2023   NA 129 (L) 02/28/2023    K 4.2 02/28/2023   CL 99 02/28/2023   CO2 21 (L) 02/28/2023    Lab Results  Component Value Date   ALT 32 02/28/2023   AST 32 02/28/2023   ALKPHOS 172 (H) 02/28/2023   BILITOT 0.5 02/28/2023       Danelle Earthly, MD [facility] 02/28/2023, 11:55 AM

## 2023-02-28 NOTE — Progress Notes (Signed)
Pt received 2mg  IV Haldol an hour and a half ago and pt is still awake but sleepy still c/o abdominal pain throughout lower abdomen.   I have given 2mg  IV Morphine twice today and both times, it has barely helped so I just gave him 4mg  IV Morphine 30 min ago and he is still fighting sleep.

## 2023-02-28 NOTE — Progress Notes (Signed)
Progress Note   Patient: Alec Campbell VWU:[id number] DOB: Nov 24, 1970 DOA: 02/19/2023     9 DOS: the patient was seen and examined on 02/28/2023     Subjective:  Patient seen and examined at bedside in the presence of his mother Leukocytosis seen today Patient having more abdominal pains Have consulted infectious disease for input For goals of care discussion I have consulted palliative care Repeat abdominal CT scan done today did not show any worsening findings Denies nausea vomiting chest pain or cough     Brief hospital course: "52 y.o male with significant PMH of mild mental retardation/developmental delay, seizure disorder, IDA, HLD,  bilateral Inguinal hernia, ascending colonic mass with tubular adenoma with high grade dysplasia, recent right hemicolectomy with stapled ileocolostomy and Laparoscopic cholecystectomy who presented to the ED with chief complaints of  diarrhea, nausea, vomiting, hiccups, fevers and chills x 7 days.  Patient underwent uncomplicated right-sided colectomy for an ascending colon mass and cholecystectomy for cholelithiasis and chronic cholecystitis by Dr. Everlene Farrier on 02/10/2023.  He was discharge don 02/11/2023 in stable condition. Per pt's mother, patient developed nausea, vomiting and diarrhea on Saturday, April 13.  Was seen by Dr. Everlene Farrier in the office on 02/16/2023 and 2 L of fluids were ordered for 02/17/2023.  Patient's mother reported new fever and 2 syncopal episodes and possible myoclonic jerks the night before presentation with brief LOC.  Unclear if episode was true seizures or not but per mother "he came back too quickly unlike his true seizure".      Pertinent Labs/Diagnostics Findings: Na+/ K+: 125/3.1  Glucose: 206 BUN/Cr.:36/1.145 WBC: Unremarkable   Lactic acid: 4.6 COVID PCR: Negative, Troponin:48   CT Abd/pelvis>multifocal pneumonia. Partial bowel obstruction and ?acute cystitis..." Pt was treated with broad spectrum IV antibiotics and IV fluids per  sepsis protocol and admitted to ICU with septic shock requiring pressors.  General surgery following for SBO. TRH service assumed care 02/21/23.   Further hospital course and management as outlined below.   Assessment and Plan: Septic shock secondary to multifocal pneumonia-present on admission POA with SIRS criteria: Heart Rate 131 beats/minute, Respiratory Rate 30 breaths/minute,Temperature 103.2, lactic acid 4.6.  Admitted to ICU requiring vasopressors. Currently off vasopressor support --Septic shock & sepsis physiology improved -- Continue current antibiotics Infectious disease on board we appreciate input   Hypophosphatemia Refeeding syndrome Phos 1.4 02/22/2023 IV K-phos was given with improvement Currently on TPN, we will continue as recommended by surgeon Advancement of diet according to surgical recommendation   Chronic diarrhea Negative C diff and GI panel According to patient's family patient has been this chronic diarrhea for some time now  IV hydration From discussion with infectious disease this is not concerning for C. difficile since patient tested negative on presentation. Monitor electrolytes & replace PRN   Hypokalemia K 3.1 on admission Ongoing IV replacement with IV fluid additive and K riders. Monitor BMP, replace K as needed Monitor magnesium levels as well as potassium level   AKI (acute kidney injury) RESOLVED with fluids. Likely pre-renal from volume depletion / dehydration, poor PO intake.   Cr 1.45 on admission, baseline 1.03. 4/24: Cr 0.70 --Continue IV fluids --Monitor BMP   Multifocal pneumonia Vs Aspiration PNA and ? UTI Acute respiratory failure with hypoxia due to PNA - resolved Complicated by Septic Shock which has resolved.  Initially treated with IV Vanc / Cefepime / Flagyl --Now on IV Zosyn - continue --Maintenance IV hydration      SBO (small bowel obstruction) In  the setting of recent right hemicolectomy and  cholecystectomy. --Mgmt per General Surgery -- NG tube have been removed by surgeon ---Advancement of diet according to surgical recommendation -- Continue TPN through PICC line --Pain control PRN --IV Valium PRN for restlessness / agitation / anxiety -- to keep lines/tubes in place Repeat CT scan of the abdomen did not show worsening of abdominal pathology   Colonic mass S/P recent R hemicolectomy. (02/10/23) General surgery following, see SBO   Seizure disorder ? Syncope unclear if related to seizure or above issues --Seizures precaution --Stop IV Keppra (?causing agitation) --Discussed case with Neurologist --Resumed phenobarbital by IV --IV Depakote in place of Keppra --Closely monitor levels      Intellectual disability with epilepsy Additional support from family and staff as needed. Agitation -continue phenobarbital by IV, changing Keppra >> Depakote IV, hope for improvement. Qtc is normal.  IV Haldol PRN if agitated despite above changes.       Hyponatremia likely secondary to the hydration Na 133 >> 131 on 02/22/2023 Continue IV fluids and TPN Monitor BMP. Follow-up on urine electrolytes     Physical examination General exam: awake, alert, no acute distress, restless, in recliner HEENT: NG tube in place to wall suction, moist mucus membranes, hearing grossly normal  Respiratory system: CTAB, no wheezes, rales or rhonchi, normal respiratory effort. Cardiovascular system: normal S1/S2, RRR, regular rhythm Gastrointestinal system: soft, mildly distended, mid/lower tenderness without rebound or guarding, recent surgical scars healing well. Central nervous system:.exam limited by intellectual disability, does not follow commands, grossly non-focal Extremities: moves all, no edema, normal tone Skin: dry, intact, normal temperature Psychiatry: Normal mood       Data Reviewed: I reviewed patient's laboratory results showing WBC 318 sodium 129, hemoglobin 10.1      Family Communication: Patient's mother present at bedside   Disposition: Status is: Inpatient Still continues to meet inpatient criteria given acute illness requiring TPN and surgical consultation, as well as infectious disease input   Time spent: 55 minutes       Vitals:   02/28/23 0926 02/28/23 1108 02/28/23 1234 02/28/23 1315  BP:  (!) 150/139 105/69 120/78  Pulse:  (!) 130 (!) 115 (!) 117  Resp:  20    Temp:  98 F (36.7 C) 98.7 F (37.1 C)   TempSrc:   Oral   SpO2:  94% 94%   Weight: 70.5 kg     Height:        Author: Loyce Dys, MD 02/28/2023 2:37 PM  For on call review [email or web address].

## 2023-02-28 NOTE — Progress Notes (Signed)
Pt had a total of 525 ml urine output on my shift 7a-7p. Pt has drank a total of 250 ml's of water today.

## 2023-02-28 NOTE — TOC Initial Note (Signed)
Transition of Care [facility]) - Initial/Assessment Note    Patient Details  Name: Alec Campbell MRN: [id number] Date of Birth: [birth year]  [facility] CM/SW Contact:    Chapman Fitch, RN Phone Number: 02/28/2023, 10:21 AM  Clinical Narrative:                  Admitted WUJ:WJXB op ileus Admitted from: hx developmental delay.  Lives with parents.  Mother at bedside  PCP: Carlynn Purl - mother manages medications and transportation Current home health/prior home health/DME: NA  Patient currently NPO for imaging. Continue TPN Make referral to [employer] for incontinence supplies closer to [place]    Expected Discharge Plan: Home/Self Care Barriers to Discharge: Continued Medical Work up   Patient Goals and CMS Choice            Expected Discharge Plan and Services                                              Prior Living Arrangements/Services   Lives with:: Parents                   Activities of Daily Living Home Assistive Devices/Equipment: None ADL Screening (condition at time of admission) Patient's cognitive ability adequate to safely complete daily activities?: Yes Is the patient deaf or have difficulty hearing?: No Does the patient have difficulty seeing, even when wearing glasses/contacts?: No Does the patient have difficulty concentrating, remembering, or making decisions?: Yes Patient able to express need for assistance with ADLs?: Yes Does the patient have difficulty dressing or bathing?: No Independently performs ADLs?: No Communication: Needs assistance Is this a change from baseline?: Pre-admission baseline Dressing (OT): Independent Grooming: Independent Feeding: Independent Bathing: Independent Toileting: Independent In/Out Bed: Independent Walks in Home: Independent Does the patient have difficulty walking or climbing stairs?: No Weakness of Legs: None Weakness of Arms/Hands: None  Permission Sought/Granted                   Emotional Assessment              Admission diagnosis:  Hypokalemia [E87.6] Hyponatremia [E87.1] SBO (small bowel obstruction) (HCC) [K56.609] AKI (acute kidney injury) (HCC) [N17.9] HCAP (healthcare-associated pneumonia) [J18.9] Nausea vomiting and diarrhea [R11.2, R19.7] Sepsis with hypotension (HCC) [A41.9, I95.9] Patient Active Problem List   Diagnosis Date Noted   Hypophosphatemia 02/22/2023   AKI (acute kidney injury) (HCC) 02/21/2023   Hypokalemia 02/21/2023   Diarrhea 02/21/2023   Multifocal pneumonia 02/20/2023   Septic shock (HCC) 02/19/2023   SBO (small bowel obstruction) (HCC) 02/19/2023   S/P partial resection of colon 02/10/2023   Calculus of gallbladder with chronic cholecystitis without obstruction 02/10/2023   Colonic mass 01/13/2023   Gastritis, Helicobacter pylori 01/02/2023   Colon polyps 01/02/2023   Absolute anemia 12/30/2022   Adenomatous polyp of colon 12/30/2022   History of Helicobacter pylori infection 12/10/2022   History of anemia 12/10/2022   Inguinal hernia with gangrene, recurrent bilateral 06/17/2020   Cold sore 02/13/2016   Vitamin D deficiency 07/20/2015   Intellectual disability with epilepsy (HCC) 07/16/2015   Seizure disorder (HCC) 07/16/2015   Seasonal allergic rhinitis 07/16/2015   Dyslipidemia 07/16/2015   Hyponatremia 06/13/2015   Abnormal blood sugar 06/19/2008   PCP:  Alba Cory, MD Pharmacy:   [facility] [address] [place] - [address]  [address] [place] [address] Phone: [phone] Fax: [phone]     Social Determinants of Health (SDOH) Social History: SDOH Screenings   Food Insecurity: No Food Insecurity (02/19/2023)  Housing: Low Risk  (02/19/2023)  Transportation Needs: No Transportation Needs (02/19/2023)  Utilities: Not At Risk (02/19/2023)  Alcohol Screen: Low Risk  (04/15/2022)  Depression (PHQ2-9): Low Risk  (01/13/2023)  Financial Resource Strain: Low Risk  (04/15/2022)   Physical Activity: Insufficiently Active (04/15/2022)  Social Connections: Socially Isolated (04/15/2022)  Stress: No Stress Concern Present (04/15/2022)  Tobacco Use: Low Risk  (02/26/2023)   SDOH Interventions:     Readmission Risk Interventions    02/28/2023   10:19 AM  Readmission Risk Prevention Plan  Transportation Screening Complete  PCP or Specialist Appt within 3-5 Days Complete  Social Work Consult for Recovery Care Planning/Counseling Complete  Medication Review Oceanographer) Complete

## 2023-02-28 NOTE — Progress Notes (Signed)
[facility] SURGICAL PROGRESS NOTE  Hospital Day(s): 9.   Interval History:  Patient seen and examined Overnight had increase in pain, RLQ - needing analgesics  This morning he looks very uncomfortable; writhing in chair Complains of lower abdomen pain/back pain No fever, no nausea/emesis  Significant increase in leukocytosis this AM; 31.4K (from 18.8K) Hgb to 10.1 Renal function normal; sCr - 0.69; UO - 8150 ccs??? Hyponatremia to 129 Stools are improved Soft diet TPN to 1/2 rate  Vital signs in last 24 hours: [min-max] current  Temp:  [97.4 F (36.3 C)-98.1 F (36.7 C)] 98.1 F (36.7 C) (04/29 0446) Pulse Rate:  [59-110] 110 (04/29 0446) Resp:  [16-18] 16 (04/29 0446) BP: (108-148)/(47-85) 108/76 (04/29 0446) SpO2:  [95 %-98 %] 95 % (04/29 0446) Weight:  [67.6 kg] 67.6 kg (04/28 1019)     Height: 5\' 10"  (177.8 cm) Weight: 67.6 kg BMI (Calculated): 21.38   Intake/Output last 2 shifts:  04/28 0701 - 04/29 0700 In: 2322.7 [P.O.:480; I.V.:1663.8; IV Piggyback:178.8] Out: 8150 [Urine:8150]   Physical Exam:  Constitutional: alert, writhing in chair; complaining of pain Respiratory: breathing non-labored at rest  Cardiovascular: regular rate and sinus rhythm  Gastrointestinal: Soft, he endorses tenderness in lower abdomen but developmental delay is unreliable Integumentary: Laparoscopic incisions are CDI with dermabond, no erythema or drainage. Ecchymosis in RLQ.   Labs:     Latest Ref Rng & Units 02/28/2023    4:45 AM 02/27/2023    9:10 AM 02/26/2023    5:30 AM  CBC  WBC 4.0 - 10.5 K/uL 31.4  18.8  23.1   Hemoglobin 13.0 - 17.0 g/dL 32.4  40.1  02.7   Hematocrit 39.0 - 52.0 % 29.7  30.5  29.3   Platelets 150 - 400 K/uL 257  244  301       Latest Ref Rng & Units 02/28/2023    4:45 AM 02/26/2023    5:30 AM 02/25/2023    4:17 AM  CMP  Glucose 70 - 99 mg/dL 253  664  403   BUN 6 - 20 mg/dL 21  19  16    Creatinine 0.61 - 1.24 mg/dL 4.74  2.59  5.63    Sodium 135 - 145 mmol/L 129  132  131   Potassium 3.5 - 5.1 mmol/L 4.2  3.8  3.7   Chloride 98 - 111 mmol/L 99  102  103   CO2 22 - 32 mmol/L 21  22  22    Calcium 8.9 - 10.3 mg/dL 8.8  8.6  8.1   Total Protein 6.5 - 8.1 g/dL 6.8     Total Bilirubin 0.3 - 1.2 mg/dL 0.5     Alkaline Phos 38 - 126 U/L 172     AST 15 - 41 U/L 32     ALT 0 - 44 U/L 32       Imaging studies:  No new imaging studies   Assessment/Plan:  52 y.o. male with likely ileus 18 days s/p hand assisted laparoscopic right hemicolectomy and cholecystectomy for right colon cancer and cholelithiasis, complicated by pertinent comorbidities including developmental delay.    - Will get STAT CT Abdomen/Pelvis to ensure no missed intra-abdominal etiology of his worsened abdominal pain and leukocytosis   - NPO this AM  - Consider anti-diarrheals   - Continue TPN; 1/2 rate - Monitor abdominal examination; on-going bowel function - Monitor leukocytosis; worsened - Monitor renal function; normalized - Mobilize as tolerated   - Further management per  primary service; we will follow   - Discharge Planning: Not ready  All of the above findings and recommendations were discussed with the patient, patient's family (mother at bedside), and the medical team, and all of their questions were answered to their expressed satisfaction.  -- Lynden Oxford, PA-C [facility] 02/28/2023, 7:23 AM M-F: 7am - 4pm

## 2023-02-28 NOTE — Progress Notes (Signed)
[facility] PA notified of pt's increased pain and increased WBC.

## 2023-02-28 NOTE — Progress Notes (Signed)
PHARMACY - TOTAL PARENTERAL NUTRITION CONSULT NOTE   Indication: Prolonged ileus  Patient Measurements: Height: 5\' 10"  (177.8 cm) Weight: 70.5 kg (155 lb 8 oz) IBW/kg (Calculated) : 73 TPN AdjBW (KG): 76.7 Body mass index is 22.31 kg/m.  Assessment:  52 y.o. male w/ PMH of seizure disorder, IDA, HLD, bilateral Inguinal hernia, ascending colonic mass with tubular adenoma with high grade dysplasia, recent right hemicolectomy admitted on 02/19/2023 with sepsis.   Glucose / Insulin:  --No history of DM --Sliding scale insulin discontinued --Blood sugar is controlled Electrolytes: Hyponatremia Renal: Scr < 1 Hepatic: LFTs within normal limits. No transaminitis. Mild hypertriglyceridemia Intake / Output:  Intake/Output Summary (Last 24 hours) at 02/28/2023 1001 Last data filed at 02/28/2023 0647 Gross per 24 hour  Intake 2082.66 ml  Output 7700 ml  Net -5617.34 ml    GI Imaging:  KUB (02/20/2022): Shows dilated loops of small bowel in RLQ; there is gas in colon, no gross pneumoperitoneum  CT 4/29: Similar mild diffuse ileus pattern involving the remaining colon. At the level of the ileocolic anastomosis, no gross evidence of anastomotic leak with potential stable small foci or two of extraluminal air postoperatively. GI Surgeries / Procedures: s/p hand assisted laparoscopic right hemicolectomy and cholecystectomy   Central access: 02/21/23 TPN start date: 02/21/23  Nutritional Goals: Goal TPN rate is 90 mL/hr (provides 112 g of protein and 2468 kcals per day)  RD Assessment: Estimated Needs Total Energy Estimated Needs: 2200-2500kcal/day Total Protein Estimated Needs: 110-125g/day Total Fluid Estimated Needs: 2.3-2.6L/day  Current Nutrition:  Full liquids  Plan:  --Will wean TPN to half rate 45 mL/hr.  Plan is to continue at half rate for at least another 24 hours. --Electrolytes in TPN: Na 47mEq/L, K 8mEq/L, Ca 37mEq/L, Mg 13mEq/L, and Phos 74mmol/L. Cl:Ac 1:1. --Continue  standard MVI, thiamine, and trace elements to TPN --Monitor TPN labs on Mon/Thurs or as clinically indicated  Barrie Folk, PharmD 02/28/2023,10:00 AM

## 2023-03-01 ENCOUNTER — Inpatient Hospital Stay: Payer: 59

## 2023-03-01 DIAGNOSIS — K567 Ileus, unspecified: Secondary | ICD-10-CM | POA: Diagnosis not present

## 2023-03-01 DIAGNOSIS — A419 Sepsis, unspecified organism: Secondary | ICD-10-CM | POA: Diagnosis not present

## 2023-03-01 DIAGNOSIS — D72829 Elevated white blood cell count, unspecified: Secondary | ICD-10-CM | POA: Diagnosis not present

## 2023-03-01 DIAGNOSIS — R6521 Severe sepsis with septic shock: Secondary | ICD-10-CM | POA: Diagnosis not present

## 2023-03-01 LAB — MAGNESIUM: Magnesium: 1.7 mg/dL (ref 1.7–2.4)

## 2023-03-01 LAB — CBC WITH DIFFERENTIAL/PLATELET
Abs Immature Granulocytes: 0.4 10*3/uL — ABNORMAL HIGH (ref 0.00–0.07)
Basophils Absolute: 0.1 10*3/uL (ref 0.0–0.1)
Basophils Relative: 0 %
Eosinophils Absolute: 0.1 10*3/uL (ref 0.0–0.5)
Eosinophils Relative: 0 %
HCT: 25.2 % — ABNORMAL LOW (ref 39.0–52.0)
Hemoglobin: 8.3 g/dL — ABNORMAL LOW (ref 13.0–17.0)
Immature Granulocytes: 2 %
Lymphocytes Relative: 6 %
Lymphs Abs: 1.4 10*3/uL (ref 0.7–4.0)
MCH: 29.2 pg (ref 26.0–34.0)
MCHC: 32.9 g/dL (ref 30.0–36.0)
MCV: 88.7 fL (ref 80.0–100.0)
Monocytes Absolute: 1.4 10*3/uL — ABNORMAL HIGH (ref 0.1–1.0)
Monocytes Relative: 6 %
Neutro Abs: 19.6 10*3/uL — ABNORMAL HIGH (ref 1.7–7.7)
Neutrophils Relative %: 86 %
Platelets: 167 10*3/uL (ref 150–400)
RBC: 2.84 MIL/uL — ABNORMAL LOW (ref 4.22–5.81)
RDW: 13.4 % (ref 11.5–15.5)
WBC: 22.9 10*3/uL — ABNORMAL HIGH (ref 4.0–10.5)
nRBC: 0 % (ref 0.0–0.2)

## 2023-03-01 LAB — COMPREHENSIVE METABOLIC PANEL
ALT: 24 U/L (ref 0–44)
AST: 36 U/L (ref 15–41)
Albumin: 2.8 g/dL — ABNORMAL LOW (ref 3.5–5.0)
Alkaline Phosphatase: 130 U/L — ABNORMAL HIGH (ref 38–126)
Anion gap: 3 — ABNORMAL LOW (ref 5–15)
BUN: 22 mg/dL — ABNORMAL HIGH (ref 6–20)
CO2: 27 mmol/L (ref 22–32)
Calcium: 8.2 mg/dL — ABNORMAL LOW (ref 8.9–10.3)
Chloride: 99 mmol/L (ref 98–111)
Creatinine, Ser: 0.73 mg/dL (ref 0.61–1.24)
GFR, Estimated: >60 mL/min (ref >60)
Glucose, Bld: 139 mg/dL — ABNORMAL HIGH (ref 70–99)
Potassium: 3.9 mmol/L (ref 3.5–5.1)
Sodium: 129 mmol/L — ABNORMAL LOW (ref 135–145)
Total Bilirubin: 0.6 mg/dL (ref 0.3–1.2)
Total Protein: 6 g/dL — ABNORMAL LOW (ref 6.5–8.1)

## 2023-03-01 LAB — PHOSPHORUS: Phosphorus: 2.9 mg/dL (ref 2.5–4.6)

## 2023-03-01 LAB — LACTATE DEHYDROGENASE: LDH: 239 U/L — ABNORMAL HIGH (ref 98–192)

## 2023-03-01 LAB — CK: Total CK: 544 U/L — ABNORMAL HIGH (ref 49–397)

## 2023-03-01 MED ORDER — ADULT MULTIVITAMIN W/MINERALS CH
1.0000 | ORAL_TABLET | Freq: Every day | ORAL | Status: DC
  Administered 2023-03-03: 1 via ORAL
  Filled 2023-03-01: qty 1

## 2023-03-01 MED ORDER — BANATROL TF EN LIQD
60.0000 mL | Freq: Two times a day (BID) | ENTERAL | Status: DC
  Administered 2023-03-01 – 2023-03-03 (×4): 60 mL via ORAL
  Filled 2023-03-01 (×4): qty 60

## 2023-03-01 MED ORDER — ENSURE ENLIVE PO LIQD
237.0000 mL | Freq: Two times a day (BID) | ORAL | Status: DC
  Administered 2023-03-01: 237 mL via ORAL

## 2023-03-01 MED ORDER — LOPERAMIDE HCL 2 MG PO CAPS
4.0000 mg | ORAL_CAPSULE | Freq: Once | ORAL | Status: AC
  Administered 2023-03-01: 4 mg via ORAL
  Filled 2023-03-01: qty 2

## 2023-03-01 MED ORDER — TRAVASOL 10 % IV SOLN
INTRAVENOUS | Status: DC
  Filled 2023-03-01: qty 561.6

## 2023-03-01 MED ORDER — KATE FARMS STANDARD 1.4 PO LIQD
325.0000 mL | Freq: Three times a day (TID) | ORAL | Status: DC
  Administered 2023-03-01 – 2023-03-03 (×3): 325 mL via ORAL

## 2023-03-01 NOTE — Progress Notes (Signed)
Progress Note   Patient: Alec Campbell ZOX:[id number] DOB: [birth year] DOA: 02/19/2023     10 DOS: the patient was seen and examined on 03/01/2023    Subjective:  Patient seen and examined at bedside in the presence of his mother Leukocytosis has improved today and abdominal pain is also better today Patient has completed his pneumonia treatment and antibiotics have been discontinued after 10 days of therapy with infectious disease input Denies nausea vomiting chest pain or cough     Brief hospital course: "52 y.o male with significant PMH of mild mental retardation/developmental delay, seizure disorder, IDA, HLD,  bilateral Inguinal hernia, ascending colonic mass with tubular adenoma with high grade dysplasia, recent right hemicolectomy with stapled ileocolostomy and Laparoscopic cholecystectomy who presented to the ED with chief complaints of  diarrhea, nausea, vomiting, hiccups, fevers and chills x 7 days.  Patient underwent uncomplicated right-sided colectomy for an ascending colon mass and cholecystectomy for cholelithiasis and chronic cholecystitis by Dr. Everlene Farrier on 02/10/2023.  He was discharge don 02/11/2023 in stable condition. Per pt's mother, patient developed nausea, vomiting and diarrhea on Saturday, April 13.  Was seen by Dr. Everlene Farrier in the office on 02/16/2023 and 2 L of fluids were ordered for 02/17/2023.  Patient's mother reported new fever and 2 syncopal episodes and possible myoclonic jerks the night before presentation with brief LOC.  Unclear if episode was true seizures or not but per mother "he came back too quickly unlike his true seizure".      Pertinent Labs/Diagnostics Findings: Na+/ K+: 125/3.1  Glucose: 206 BUN/Cr.:36/1.145 WBC: Unremarkable   Lactic acid: 4.6 COVID PCR: Negative, Troponin:48   CT Abd/pelvis>multifocal pneumonia. Partial bowel obstruction and ?acute cystitis..." Pt was treated with broad spectrum IV antibiotics and IV fluids per sepsis protocol and admitted to  ICU with septic shock requiring pressors.  General surgery following for SBO. TRH service assumed care 02/21/23.   Further hospital course and management as outlined below.   Assessment and Plan: Septic shock secondary to multifocal pneumonia-present on admission POA with SIRS criteria: Heart Rate 131 beats/minute, Respiratory Rate 30 breaths/minute,Temperature 103.2, lactic acid 4.6.  Admitted to ICU requiring vasopressors. Currently off vasopressor support --Septic shock & sepsis physiology improved -- Leukocytosis has improved today Patient has completed his pneumonia treatment and antibiotics have been discontinued today after 10 days of therapy with infectious disease input Infectious disease on board we appreciate input   Hypophosphatemia Refeeding syndrome Phos 1.4 02/22/2023 IV K-phos was given with improvement Currently on TPN, we will continue as recommended by surgeon Advancement of diet according to surgical recommendation   Chronic diarrhea Negative C diff and GI panel According to patient's family patient has been this chronic diarrhea for some time now  IV hydration From discussion with infectious disease this is not concerning for C. difficile since patient tested negative on presentation. Monitor electrolytes & replace PRN   Hypokalemia-improved K 3.1 on admission Continue to monitor potassium levels   AKI (acute kidney injury) RESOLVED with fluids. Likely pre-renal from volume depletion / dehydration, poor PO intake.   Cr 1.45 on admission, baseline 1.03. 4/24: Cr 0.70 --Continue IV fluids --Monitor BMP   Multifocal pneumonia Vs Aspiration PNA and ? UTI Acute respiratory failure with hypoxia due to PNA - resolved Complicated by Septic Shock which has resolved.  Initially treated with IV Vanc / Cefepime / Flagyl -- Has completed Zosyn course --Maintenance IV hydration      SBO (small bowel obstruction) In the setting of recent  right hemicolectomy and  cholecystectomy. --Mgmt per General Surgery -- NG tube have been removed by surgeon ---Advancement of diet according to surgical recommendation -- Continue TPN through PICC line --Pain control PRN --IV Valium PRN for restlessness / agitation / anxiety -- to keep lines/tubes in place Repeat CT scan of the abdomen did not show worsening of abdominal pathology   Colonic mass S/P recent R hemicolectomy. (02/10/23) General surgery following, see SBO   Seizure disorder ? Syncope unclear if related to seizure or above issues --Seizures precaution --Stop IV Keppra (?causing agitation) --Discussed case with Neurologist --Resumed phenobarbital  --Closely monitor levels    Oral herpes Infectious disease following Continue valacyclovir  Intellectual disability with epilepsy Additional support from family and staff as needed. Agitation -continue phenobarbital  Qtc is normal.  IV Haldol PRN if agitated despite above changes.       Hyponatremia likely secondary to dehydration Na 133 >> 131 on 02/22/2023 Continue IV fluids and TPN Monitor BMP. Follow-up on urine electrolytes     Physical examination General exam: awake, alert, no acute distress, restless, in recliner HEENT: NG tube in place to wall suction, moist mucus membranes, hearing grossly normal  Respiratory system: CTAB, no wheezes, rales or rhonchi, normal respiratory effort. Cardiovascular system: normal S1/S2, RRR, regular rhythm Gastrointestinal system: soft, mildly distended, mid/lower tenderness without rebound or guarding, recent surgical scars healing well. Central nervous system:.exam limited by intellectual disability, does not follow commands, grossly non-focal Extremities: moves all, no edema, normal tone Skin: dry, intact, normal temperature Psychiatry: Normal mood       Data Reviewed: I reviewed patient's laboratory results showing WBC 22.9 hemoglobin 8.3 sodium 129 potassium 3.9   Family Communication:  Patient's mother present at bedside   Disposition: Status is: Inpatient Still continues to meet inpatient criteria given acute illness requiring TPN and surgical consultation, as well as infectious disease input   Time spent: 50 minutes       Vitals:   02/28/23 2213 03/01/23 0201 03/01/23 0410 03/01/23 0959  BP: 106/69 119/64 (!) 122/50 (!) 146/85  Pulse: (!) 107 (!) 114 (!) 109 (!) 106  Resp: 16 20 16 20   Temp: 97.8 F (36.6 C) 97.7 F (36.5 C) 97.9 F (36.6 C) 98 F (36.7 C)  TempSrc: Axillary Oral    SpO2: 96% 94% 96% 95%  Weight:      Height:        Author: Loyce Dys, MD 03/01/2023 2:28 PM  For on call review [email or web address].

## 2023-03-01 NOTE — Progress Notes (Signed)
[facility] SURGICAL PROGRESS NOTE  Hospital Day(s): 10.   Interval History:  Patient seen and examined Patient had a much better night; rested well per mother No complaints this morning Denied any abdominal pain No fever, no nausea/emesis  Leukocytosis improved; 22.9K Hgb to 8.3; suspect dilution Renal function normal; sCr - 0.73; UO - 525 ccs + unrecorded Hyponatremia to 129 LDH elevated at 239 CK elevated at 544 Stools are still loose  NPO TPN to 1/2 rate  Vital signs in last 24 hours: [min-max] current  Temp:  [97.7 F (36.5 C)-98.8 F (37.1 C)] 97.9 F (36.6 C) (04/30 0410) Pulse Rate:  [107-138] 109 (04/30 0410) Resp:  [16-20] 16 (04/30 0410) BP: (105-150)/(50-139) 122/50 (04/30 0410) SpO2:  [91 %-96 %] 96 % (04/30 0410) Weight:  [70.5 kg] 70.5 kg (04/29 0926)     Height: 5\' 10"  (177.8 cm) Weight: 70.5 kg BMI (Calculated): 22.31   Intake/Output last 2 shifts:  04/29 0701 - 04/30 0700 In: 438.8 [I.V.:273.4; IV Piggyback:165.3] Out: 525 [Urine:525]   Physical Exam:  Constitutional: alert, sleeping in bed; appears markedly more comfortable this morning Respiratory: breathing non-labored at rest  Cardiovascular: regular rate and sinus rhythm  Gastrointestinal: Soft, abdomen is non-tender, non-distended, no rebound/guarding. He is certainly without evidence of peritonitis this AM Integumentary: Laparoscopic incisions are CDI with dermabond, no erythema or drainage. Ecchymosis in RLQ.   Labs:     Latest Ref Rng & Units 03/01/2023    4:37 AM 02/28/2023    4:45 AM 02/27/2023    9:10 AM  CBC  WBC 4.0 - 10.5 K/uL 22.9  31.4  18.8   Hemoglobin 13.0 - 17.0 g/dL 8.3  16.1  09.6   Hematocrit 39.0 - 52.0 % 25.2  29.7  30.5   Platelets 150 - 400 K/uL 167  257  244       Latest Ref Rng & Units 03/01/2023    4:37 AM 02/28/2023    4:45 AM 02/26/2023    5:30 AM  CMP  Glucose 70 - 99 mg/dL 045  409  811   BUN 6 - 20 mg/dL 22  21  19    Creatinine 0.61 - 1.24  mg/dL 9.14  7.82  9.56   Sodium 135 - 145 mmol/L 129  129  132   Potassium 3.5 - 5.1 mmol/L 3.9  4.2  3.8   Chloride 98 - 111 mmol/L 99  99  102   CO2 22 - 32 mmol/L 27  21  22    Calcium 8.9 - 10.3 mg/dL 8.2  8.8  8.6   Total Protein 6.5 - 8.1 g/dL 6.0  6.8    Total Bilirubin 0.3 - 1.2 mg/dL 0.6  0.5    Alkaline Phos 38 - 126 U/L 130  172    AST 15 - 41 U/L 36  32    ALT 0 - 44 U/L 24  32      Imaging studies:   KUB (03/01/2023) personally reviewed; air in colon, no free air, no pneumatosis, and radiologisr report reviewed below:  IMPRESSION: Slightly decreased small bowel dilatation is noted suggesting improving ileus.   Assessment/Plan:  52 y.o. male with likely ileus 18 days s/p hand assisted laparoscopic right hemicolectomy and cholecystectomy for right colon cancer and cholelithiasis, complicated by pertinent comorbidities including developmental delay.    - Labs appear to be consistent with Rhabdomyolysis: unsure etiology, question drug induced? Possibly from anti-psychotics (ie: Haldol)? Will discuss with medicine    -  Will restart FLD; monitor   - Continue anti-diarrheals   - Continue TPN; 1/2 rate today. If tolerates diet and clinically improving, may discontinue this tomorrow (05/01) - Monitor abdominal examination; on-going bowel function - Monitor leukocytosis; improving - Monitor renal function; normalized - Mobilize as tolerated   - Further management per primary service; we will follow   - Discharge Planning: Not quite ready, clinical improvement from yesterday  All of the above findings and recommendations were discussed with the patient, patient's family (mother at bedside), and the medical team, and all of their questions were answered to their expressed satisfaction.  -- Lynden Oxford, PA-C [facility] 03/01/2023, 7:43 AM M-F: 7am - 4pm

## 2023-03-01 NOTE — Consult Note (Signed)
Consultation Note Date: 03/01/2023   Patient Name: Alec Campbell  DOB: 1970-11-24  MRN: [id number]  Age / Sex: 52 y.o., male  PCP: Alba Cory, MD Referring Physician: Loyce Dys, MD  Reason for Consultation: Establishing goals of care  HPI/Patient Profile: "52 y.o male with significant PMH of mild mental retardation/developmental delay, seizure disorder, IDA, HLD,  bilateral Inguinal hernia, ascending colonic mass with tubular adenoma with high grade dysplasia, recent right hemicolectomy with stapled ileocolostomy and Laparoscopic cholecystectomy who presented to the ED with chief complaints of  diarrhea, nausea, vomiting, hiccups, fevers and chills x 7 days.  Patient underwent uncomplicated right-sided colectomy for an ascending colon mass and cholecystectomy for cholelithiasis and chronic cholecystitis by Dr. Everlene Farrier on 02/10/2023.  He was discharge don 02/11/2023 in stable condition. Per pt's mother, patient developed nausea, vomiting and diarrhea on Saturday, April 13.  Was seen by Dr. Everlene Farrier in the office on 02/16/2023 and 2 L of fluids were ordered for 02/17/2023.  Patient's mother reported new fever and 2 syncopal episodes and possible myoclonic jerks the night before presentation with brief LOC.  Unclear if episode was true seizures or not but per mother "he came back too quickly unlike his true seizure".   Clinical Assessment and Goals of Care:  Notes and labs reviewed.  In to see patient.  Patient's mother, father, and cousin are sitting at bedside.  Patient's mother discusses that she has been at bedside without leaving, but that patient will not allow her to leave, that he wants her present.  She states her husband is bringing her changes of clothing, and she is okay with this.  She states he had been restless and agitated, but this has stopped since his pain has decreased.  She advises he was able to sleep  last night until 9 this morning.    She discusses his hospitalization up to this point.  She discusses the hope that he will tolerate an oral diet and be able to wean from TPN.  Patient is currently resting in bed. He sleepily raises his head, opening his eyes a few times, and then lays his head back down and dozes.  She states he has been trying to sleep.  Discussed that I would follow back up in the morning to check on them and talk further.  SUMMARY OF RECOMMENDATIONS   PMT will follow  Prognosis:  Unable to determine       Primary Diagnoses: Present on Admission:  Septic shock (HCC)  SBO (small bowel obstruction) (HCC)  Multifocal pneumonia  Colonic mass  AKI (acute kidney injury) (HCC)  Hypokalemia  Diarrhea  Hyponatremia   I have reviewed the medical record, interviewed the patient and family, and examined the patient. The following aspects are pertinent.  Past Medical History:  Diagnosis Date   Allergy    Moderate mental retardation    Seizures (HCC)    Vitamin D deficiency    Social History   Socioeconomic History   Marital status: Single    Spouse  name: Not on file   Number of children: 0   Years of education: Handicapped diploma   Highest education level: Not on file  Occupational History    Employer: DISABLED  Tobacco Use   Smoking status: Never    Passive exposure: Never   Smokeless tobacco: Never  Vaping Use   Vaping Use: Never used  Substance and Sexual Activity   Alcohol use: No    Alcohol/week: 0.0 standard drinks of alcohol   Drug use: No   Sexual activity: Never  Other Topics Concern   Not on file  Social History Narrative   Moderate mental retardation. Parents are the primary caregiver, he lives with them   Social Determinants of Health   Financial Resource Strain: Low Risk  (04/15/2022)   Overall Financial Resource Strain (CARDIA)    Difficulty of Paying Living Expenses: Not hard at all  Food Insecurity: No Food Insecurity  (02/19/2023)   Hunger Vital Sign    Worried About Running Out of Food in the Last Year: Never true    Ran Out of Food in the Last Year: Never true  Transportation Needs: No Transportation Needs (02/19/2023)   PRAPARE - Administrator, Civil Service (Medical): No    Lack of Transportation (Non-Medical): No  Physical Activity: Insufficiently Active (04/15/2022)   Exercise Vital Sign    Days of Exercise per Week: 7 days    Minutes of Exercise per Session: 10 min  Stress: No Stress Concern Present (04/15/2022)   [employer] of Occupational Health - Occupational Stress Questionnaire    Feeling of Stress : Not at all  Social Connections: Socially Isolated (04/15/2022)   Social Connection and Isolation Panel [NHANES]    Frequency of Communication with Friends and Family: Twice a week    Frequency of Social Gatherings with Friends and Family: Once a week    Attends Religious Services: Never    Database administrator or Organizations: No    Attends Engineer, structural: Never    Marital Status: Never married   Family History  Problem Relation Age of Onset   Hypertension Mother    Thyroid disease Mother    CAD Father    Hypertension Father    Hyperlipidemia Father    Healthy Brother    Healthy Sister    Diabetes Brother    COPD Brother    Emphysema Brother    Alcohol abuse Brother    Hypertension Brother    Aneurysm Brother        brain   Scheduled Meds:  carbamazepine  200 mg Oral BID   carbamazepine  400 mg Oral q AM   Chlorhexidine Gluconate Cloth  6 each Topical Daily   enoxaparin (LOVENOX) injection  40 mg Subcutaneous QHS   feeding supplement  237 mL Oral BID BM   mouth rinse  15 mL Mouth Rinse 4 times per day   phenobarbital  64.8 mg Oral BID   sodium chloride flush  10-40 mL Intracatheter Q12H   valACYclovir  1,000 mg Oral BID   Continuous Infusions:  sodium chloride Stopped (02/27/23 2201)   TPN ADULT (ION) 45 mL/hr at 02/28/23 1752   TPN  ADULT (ION)     PRN Meds:.bacitracin, diazepam, docusate sodium, haloperidol lactate, ipratropium-albuterol, labetalol, liver oil-zinc oxide, morphine injection, mouth rinse, oxyCODONE, sodium chloride flush Medications Prior to Admission:  Prior to Admission medications   Medication Sig Start Date End Date Taking? Authorizing Provider  carbamazepine (TEGRETOL) 200  MG tablet TAKE 2 TABLETS BY MOUTH IN THE MORNING, THEN TAKE  1 TABLET AT LUNCH AND TAKE 1 AT NIGHT 12/10/22  Yes Sowles, Danna Hefty, MD  chlorpheniramine (CHLOR-TRIMETON) 4 MG tablet Take 4 mg by mouth 2 (two) times daily as needed for allergies.   Yes [provider]  ondansetron (ZOFRAN-ODT) 4 MG disintegrating tablet Take 1 tablet (4 mg total) by mouth every 6 (six) hours as needed for nausea. 02/11/23  Yes Donovan Kail, PA-C  PHENobarbital (LUMINAL) 64.8 MG tablet Take 1 tablet (64.8 mg total) by mouth 2 (two) times daily. 12/10/22  Yes Sowles, Danna Hefty, MD  rosuvastatin (CRESTOR) 10 MG tablet Take 1 tablet (10 mg total) by mouth daily. 12/10/22  Yes Sowles, Danna Hefty, MD  valACYclovir (VALTREX) 1000 MG tablet TAKE 1 TABLET BY MOUTH TWICE DAILY AS NEEDED FOR  OUTBREAK 08/29/22  Yes Sowles, Danna Hefty, MD  PREVIDENT 5000 SENSITIVE 1.1-5 % GEL Place onto teeth 2 (two) times daily. 05/11/21   [provider]   No Known Allergies Review of Systems  Unable to perform ROS   Physical Exam Constitutional:      Comments: Opens eyes intermittently.  No distress noted.     Vital Signs: BP (!) 146/85 (BP Location: Left Arm)   Pulse (!) 106   Temp 98 F (36.7 C)   Resp 20   Ht 5\' 10"  (1.778 m)   Wt 70.5 kg   SpO2 95%   BMI 22.31 kg/m  Pain Scale: Faces POSS *See Group Information*: 1-Acceptable,Awake and alert Pain Score: Asleep   SpO2: SpO2: 95 % O2 Device:SpO2: 95 % O2 Flow Rate: .   IO: Intake/output summary:  Intake/Output Summary (Last 24 hours) at 03/01/2023 1411 Last data filed at 03/01/2023 1006 Gross per  24 hour  Intake 558.76 ml  Output 1225 ml  Net -666.24 ml    LBM: Last BM Date : 03/01/23 Baseline Weight: Weight: 76.7 kg Most recent weight: Weight: 70.5 kg       Signed by: Morton Stall, NP   Please contact Palliative Medicine Team phone at [phone] for questions and concerns.  For individual provider: See Loretha Stapler

## 2023-03-01 NOTE — Progress Notes (Signed)
PHARMACY - TOTAL PARENTERAL NUTRITION CONSULT NOTE   Indication: Prolonged ileus  Patient Measurements: Height: 5\' 10"  (177.8 cm) Weight: 70.5 kg (155 lb 8 oz) IBW/kg (Calculated) : 73 TPN AdjBW (KG): 76.7 Body mass index is 22.31 kg/m.  Assessment:  52 y.o. male w/ PMH of seizure disorder, IDA, HLD, bilateral Inguinal hernia, ascending colonic mass with tubular adenoma with high grade dysplasia, recent right hemicolectomy admitted on 02/19/2023 with sepsis.   Glucose / Insulin:  --No history of DM --Sliding scale insulin discontinued --Blood sugar is controlled Electrolytes: Hyponatremia Renal: Scr < 1 Hepatic: LFTs within normal limits. No transaminitis. Mild hypertriglyceridemia Intake / Output:  Intake/Output Summary (Last 24 hours) at 03/01/2023 0850 Last data filed at 03/01/2023 0300 Gross per 24 hour  Intake 438.76 ml  Output 525 ml  Net -86.24 ml     GI Imaging:  KUB (02/20/2022): Shows dilated loops of small bowel in RLQ; there is gas in colon, no gross pneumoperitoneum  CT 4/29: Similar mild diffuse ileus pattern involving the remaining colon. At the level of the ileocolic anastomosis, no gross evidence of anastomotic leak with potential stable small foci or two of extraluminal air postoperatively. GI Surgeries / Procedures: s/p hand assisted laparoscopic right hemicolectomy and cholecystectomy   Central access: 02/21/23 TPN start date: 02/21/23  Nutritional Goals: Goal TPN rate is 90 mL/hr (provides 112 g of protein and 2468 kcals per day)  RD Assessment: Estimated Needs Total Energy Estimated Needs: 2200-2500kcal/day Total Protein Estimated Needs: 110-125g/day Total Fluid Estimated Needs: 2.3-2.6L/day  Current Nutrition:  Full liquids  Plan:  --Will wean TPN to half rate 45 mL/hr.  Plan is to continue at half rate for at least another 24 hours.  If tolerates diet hope to stop TPN on 03/02/23. --Electrolytes in TPN: Na 71mEq/L, K 28mEq/L, Ca 78mEq/L, Mg  68mEq/L, and Phos 23mmol/L. Cl:Ac 1:1. --Continue standard MVI, thiamine, and trace elements to TPN --Monitor TPN labs on Mon/Thurs or as clinically indicated  Barrie Folk, PharmD 03/01/2023,8:50 AM

## 2023-03-01 NOTE — Progress Notes (Signed)
Nutrition Follow Up Note   DOCUMENTATION CODES:   Not applicable  INTERVENTION:   Continue TPN per pharmacy- currently at 1/2 rate   Molli Posey Nutrition Shake po TID- each supplement provides 330kcal and 16g protein  MVI po daily   Banatrol TF po BID- Provides 45kcal, 5g soluble fiber and 2g protein per serving.  NUTRITION DIAGNOSIS:   Inadequate oral intake related to acute illness as evidenced by NPO status. -ongoing   GOAL:   Patient will meet greater than or equal to 90% of their needs - met with TPN   MONITOR:   PO intake, Supplement acceptance, Labs, Weight trends, I & O's, Skin  ASSESSMENT:   52 y/o male with h/o intellectual disability with epilepsy, HLD, H. Pylori, colon mass and cholelithiasis s/p hand assisted laparoscopic right hemicolectomy with stapled ileocolostomy and laparoscopic cholecystectomy 4/11 who is admitted with diarrhea, sepsis, AKI, PNA and possible SBO.  Met with pt and family in room today. Pt is asking to go home. Mother reports pt with abdominal pain yesterday but reports this is resolved today. KUB from today reports resolving ileus. Mother reports that patient is having large amounts of diarrhea. Mother reports pt eating mainly pudding cups as this is the only thing he likes off of the full liquid menu. Pt did drink 100% of an Ensure but mother reports that this made the diarrhea worse. RD will change pt over to a plant based supplement to see if this is better tolerated. RD will add banatrol to help thicken up pt's stools. Pt tolerating TPN at half rate; plan is to possibly discontinue tomorrow. Refeed labs stable. Per chart, pt appears to be at his UBW.       Medications reviewed and include: lovenox  Labs reviewed: Na 129(L), K 3.9 wnl, P 2.9 wnl, Mg 1.7 wnl CK 544(H) Triglycerides- 163(H)- 4/29 Wbc- 22.9(H), Hgb 8.3(L), Hct 25.2(L)  NUTRITION - FOCUSED PHYSICAL EXAM:  Flowsheet Row Most Recent Value  Orbital Region No depletion   Upper Arm Region Mild depletion  Thoracic and Lumbar Region No depletion  Buccal Region No depletion  Temple Region No depletion  Clavicle Bone Region Mild depletion  Clavicle and Acromion Bone Region Mild depletion  Scapular Bone Region No depletion  Dorsal Hand Mild depletion  Patellar Region Severe depletion  Anterior Thigh Region Severe depletion  Posterior Calf Region Severe depletion  Edema (RD Assessment) None  Hair Reviewed  Eyes Reviewed  Mouth Reviewed  Skin Reviewed  Nails Reviewed   Diet Order:   Diet Order             Diet full liquid Fluid consistency: Thin  Diet effective now                  EDUCATION NEEDS:   Not appropriate for education at this time  Skin:  Skin Assessment: Reviewed RN Assessment (incision abdomen)  Last BM:  4/30- type 7  Height:   Ht Readings from Last 1 Encounters:  02/19/23 5\' 10"  (1.778 m)    Weight:   Wt Readings from Last 1 Encounters:  02/28/23 70.5 kg    Ideal Body Weight:  75.45 kg  BMI:  Body mass index is 22.31 kg/m.  Estimated Nutritional Needs:   Kcal:  2200-2500kcal/day  Protein:  110-125g/day  Fluid:  2.3-2.6L/day  Betsey Holiday MS, RD, LDN Please refer to [facility] for RD and/or RD on-call/weekend/after hours pager

## 2023-03-01 NOTE — TOC Progression Note (Signed)
Transition of Care [facility]) - Progression Note    Patient Details  Name: Alec Campbell MRN: [id number] Date of Birth: [birth year]  Transition of [facility]) CM/SW Contact  Chapman Fitch, RN Phone Number: 03/01/2023, 9:26 AM  Clinical Narrative:    Plan to restart full liquid today, and continue TPN at half rate    Expected Discharge Plan: Home/Self Care Barriers to Discharge: Continued Medical Work up  Expected Discharge Plan and Services                                               Social Determinants of Health (SDOH) Interventions SDOH Screenings   Food Insecurity: No Food Insecurity (02/19/2023)  Housing: Low Risk  (02/19/2023)  Transportation Needs: No Transportation Needs (02/19/2023)  Utilities: Not At Risk (02/19/2023)  Alcohol Screen: Low Risk  (04/15/2022)  Depression (PHQ2-9): Low Risk  (01/13/2023)  Financial Resource Strain: Low Risk  (04/15/2022)  Physical Activity: Insufficiently Active (04/15/2022)  Social Connections: Socially Isolated (04/15/2022)  Stress: No Stress Concern Present (04/15/2022)  Tobacco Use: Low Risk  (02/26/2023)    Readmission Risk Interventions    02/28/2023   10:19 AM  Readmission Risk Prevention Plan  Transportation Screening Complete  PCP or Specialist Appt within 3-5 Days Complete  Social Work Consult for Recovery Care Planning/Counseling Complete  Medication Review Oceanographer) Complete

## 2023-03-01 NOTE — Progress Notes (Signed)
[facility]  Date of Admission:  02/19/2023   Principal Problem:   Septic shock [facility]) Active Problems:   Hyponatremia   Intellectual disability with epilepsy (HCC)   Seizure disorder (HCC)   Colonic mass   SBO (small bowel obstruction) (HCC)   Multifocal pneumonia   AKI (acute kidney injury) (HCC)   Hypokalemia   Diarrhea   Hypophosphatemia          Assessment: 52 year old male admitted with of ascending colon mass status post right-sided lap hand- assisted colectomy and cholecystectomy for cholelithiasis and chronic cholecystitis on 4/11 with Dr. Everlene Farrier. Did well post =op until he developed diarrhea and emesis. Admitted for aspiration pneumonia with diarrhea.  Infectious disease engaged and patient had worsening abdominal pain and leukocytosis at 31 K.   #Leukocytosis #Ileus #Soft stools Stool studies including C. difficile, GIP, stool ova parasite negative on 4/21, blood and urine cultures negative.  Patient's parents report that patient is having pudding consistency stools, last bowel movement was around 4 PM yesterday.  They deny patient is having watery stools.   His abdominal pain worsened this morning.  I looked at the wound sites in the abdomen and and were draining, no surrounding fluid erythema. CT abdomen pelvis today showed respiratory motion artifact throughout CT, similar mild diffuse ileus pattern involving colon.  Slight improvement in posterior bibasilar pulmonary consolidative opacity likely representing atelectasis, stable bilateral inguinal hernias containing fat right greater than left. Surgery reviewed CT, plan on surgical management prior to seeing if there is need for diagnostic laparoscopy.  Patient clinically improved today with decreasing white cell count.  As CK was 544 suspect some degree of rhabdo.  No plans for surgical intervention. Recommendations:  -Hold antibiotics.  Mother noted the patient had some diarrhea today,  he did not get his dose of Imodium yesterday(last dose on 4/28).  Agree with concern and antidiarrhea medicine. -ID will sign off   #Pneumonia - CT on arrival showed bilateral lower lobe intra for trace consistent with multifocal pneumonia - Respiratory status at baseline, patient is on on room air.  Treated with pip-tazo x 10d           #Oral lesion consistent with oral herpes -HSV oral swab ordered - Valtrex x 10d     Microbiology:   Antibiotics: Cefepime 4/19 Metronidazole 4/19 Vancomycin 4/19 Pip-tazo 4/20-present   Cultures: Blood /20 no growth Urine 4/21 no growth Other 4/21 C. difficile toxin/antigen negative GIP negative Stool O&P negative    SUBJECTIVE: Resting in bed, pain is improved. Mother at bedside Interval: afebrile wbc 22k  Review of Systems: ROS   Scheduled Meds:  carbamazepine  200 mg Oral BID   carbamazepine  400 mg Oral q AM   Chlorhexidine Gluconate Cloth  6 each Topical Daily   enoxaparin (LOVENOX) injection  40 mg Subcutaneous QHS   feeding supplement (KATE FARMS STANDARD 1.4)  325 mL Oral TID BM   fiber supplement (BANATROL TF)  60 mL Oral BID   [START ON 03/03/2023] multivitamin with minerals  1 tablet Oral Daily   mouth rinse  15 mL Mouth Rinse 4 times per day   phenobarbital  64.8 mg Oral BID   sodium chloride flush  10-40 mL Intracatheter Q12H   valACYclovir  1,000 mg Oral BID   Continuous Infusions:  sodium chloride Stopped (02/27/23 2201)   TPN ADULT (ION) 45 mL/hr at 03/01/23 1807   PRN Meds:.bacitracin, diazepam, docusate  sodium, haloperidol lactate, ipratropium-albuterol, labetalol, liver oil-zinc oxide, morphine injection, mouth rinse, oxyCODONE, sodium chloride flush No Known Allergies  OBJECTIVE: Vitals:   03/01/23 0201 03/01/23 0410 03/01/23 0959 03/01/23 2039  BP: 119/64 (!) 122/50 (!) 146/85 121/73  Pulse: (!) 114 (!) 109 (!) 106 90  Resp: 20 16 20 20   Temp: 97.7 F (36.5 C) 97.9 F (36.6 C) 98 F (36.7 C)  99.1 F (37.3 C)  TempSrc: Oral   Axillary  SpO2: 94% 96% 95% 100%  Weight:      Height:       Body mass index is 22.31 kg/m.  Physical Exam    Lab Results Lab Results  Component Value Date   WBC 22.9 (H) 03/01/2023   HGB 8.3 (L) 03/01/2023   HCT 25.2 (L) 03/01/2023   MCV 88.7 03/01/2023   PLT 167 03/01/2023    Lab Results  Component Value Date   CREATININE 0.73 03/01/2023   BUN 22 (H) 03/01/2023   NA 129 (L) 03/01/2023   K 3.9 03/01/2023   CL 99 03/01/2023   CO2 27 03/01/2023    Lab Results  Component Value Date   ALT 24 03/01/2023   AST 36 03/01/2023   ALKPHOS 130 (H) 03/01/2023   BILITOT 0.6 03/01/2023        Danelle Earthly, MD [facility] 03/01/2023, 10:38 PM

## 2023-03-02 DIAGNOSIS — A419 Sepsis, unspecified organism: Secondary | ICD-10-CM | POA: Diagnosis not present

## 2023-03-02 DIAGNOSIS — R6521 Severe sepsis with septic shock: Secondary | ICD-10-CM | POA: Diagnosis not present

## 2023-03-02 DIAGNOSIS — Z7189 Other specified counseling: Secondary | ICD-10-CM

## 2023-03-02 LAB — CBC WITH DIFFERENTIAL/PLATELET
Abs Immature Granulocytes: 0.2 10*3/uL — ABNORMAL HIGH (ref 0.00–0.07)
Basophils Absolute: 0.1 10*3/uL (ref 0.0–0.1)
Basophils Relative: 0 %
Eosinophils Absolute: 0.2 10*3/uL (ref 0.0–0.5)
Eosinophils Relative: 1 %
HCT: 26.5 % — ABNORMAL LOW (ref 39.0–52.0)
Hemoglobin: 9.1 g/dL — ABNORMAL LOW (ref 13.0–17.0)
Immature Granulocytes: 1 %
Lymphocytes Relative: 11 %
Lymphs Abs: 1.7 10*3/uL (ref 0.7–4.0)
MCH: 29.3 pg (ref 26.0–34.0)
MCHC: 34.3 g/dL (ref 30.0–36.0)
MCV: 85.2 fL (ref 80.0–100.0)
Monocytes Absolute: 1 10*3/uL (ref 0.1–1.0)
Monocytes Relative: 7 %
Neutro Abs: 12.4 10*3/uL — ABNORMAL HIGH (ref 1.7–7.7)
Neutrophils Relative %: 80 %
Platelets: 224 10*3/uL (ref 150–400)
RBC: 3.11 MIL/uL — ABNORMAL LOW (ref 4.22–5.81)
RDW: 13.2 % (ref 11.5–15.5)
WBC: 15.6 10*3/uL — ABNORMAL HIGH (ref 4.0–10.5)
nRBC: 0 % (ref 0.0–0.2)

## 2023-03-02 MED ORDER — LOPERAMIDE HCL 2 MG PO CAPS: 4.0000 mg | ORAL_CAPSULE | Freq: Two times a day (BID) | ORAL | 0 refills | Status: AC

## 2023-03-02 MED ORDER — LOPERAMIDE HCL 2 MG PO CAPS
4.0000 mg | ORAL_CAPSULE | Freq: Two times a day (BID) | ORAL | Status: DC
  Administered 2023-03-02: 4 mg via ORAL
  Filled 2023-03-02: qty 2

## 2023-03-02 MED ORDER — LOPERAMIDE HCL 2 MG PO CAPS
4.0000 mg | ORAL_CAPSULE | Freq: Three times a day (TID) | ORAL | Status: DC
  Administered 2023-03-02 – 2023-03-03 (×3): 4 mg via ORAL
  Filled 2023-03-02 (×3): qty 2

## 2023-03-02 NOTE — Progress Notes (Signed)
[facility] SURGICAL PROGRESS NOTE  Hospital Day(s): 11.   Interval History:  Patient seen and examined Patient is doing well No complaints of abdominal pain but not most reliable historian  No fever, no nausea/emesis  Leukocytosis improved further even after cessation of Abx; 15.6K Hgb to 9.1 Stools are still loose again; imodium appears have been stopped FLD; enjoyed the pudding TPN to 1/2 rate  Vital signs in last 24 hours: [min-max] current  Temp:  [97 F (36.1 C)-100.2 F (37.9 C)] 97 F (36.1 C) (05/01 0733) Pulse Rate:  [90-106] 90 (05/01 0733) Resp:  [18-20] 18 (05/01 0733) BP: (119-146)/(67-85) 119/67 (05/01 0733) SpO2:  [95 %-100 %] 95 % (05/01 0733)     Height: 5\' 10"  (177.8 cm) Weight: 70.5 kg BMI (Calculated): 22.31   Intake/Output last 2 shifts:  04/30 0701 - 05/01 0700 In: 600 [P.O.:600] Out: 200 [Urine:200]   Physical Exam:  Constitutional: alert, sitting up in bed; NAD Respiratory: breathing non-labored at rest  Cardiovascular: regular rate and sinus rhythm  Gastrointestinal: Soft, abdomen is non-tender, non-distended, no rebound/guarding. He is certainly without evidence of peritonitis this AM Integumentary: Laparoscopic incisions are CDI with dermabond, no erythema or drainage. Ecchymosis in RLQ.   Labs:     Latest Ref Rng & Units 03/02/2023    7:04 AM 03/01/2023    4:37 AM 02/28/2023    4:45 AM  CBC  WBC 4.0 - 10.5 K/uL 15.6  22.9  31.4   Hemoglobin 13.0 - 17.0 g/dL 9.1  8.3  09.8   Hematocrit 39.0 - 52.0 % 26.5  25.2  29.7   Platelets 150 - 400 K/uL 224  167  257       Latest Ref Rng & Units 03/01/2023    4:37 AM 02/28/2023    4:45 AM 02/26/2023    5:30 AM  CMP  Glucose 70 - 99 mg/dL 119  147  829   BUN 6 - 20 mg/dL 22  21  19    Creatinine 0.61 - 1.24 mg/dL 5.62  1.30  8.65   Sodium 135 - 145 mmol/L 129  129  132   Potassium 3.5 - 5.1 mmol/L 3.9  4.2  3.8   Chloride 98 - 111 mmol/L 99  99  102   CO2 22 - 32 mmol/L 27  21  22     Calcium 8.9 - 10.3 mg/dL 8.2  8.8  8.6   Total Protein 6.5 - 8.1 g/dL 6.0  6.8    Total Bilirubin 0.3 - 1.2 mg/dL 0.6  0.5    Alkaline Phos 38 - 126 U/L 130  172    AST 15 - 41 U/L 36  32    ALT 0 - 44 U/L 24  32      Imaging studies:  No new imaging studies this AM   Assessment/Plan:  52 y.o. male with likely ileus 18 days s/p hand assisted laparoscopic right hemicolectomy and cholecystectomy for right colon cancer and cholelithiasis, complicated by pertinent comorbidities including developmental delay.    - Will advance to regular diet  - Stop TPN today; he is hyper focused on this this AM  - Remain OFF Abx  - Restart anti-diarrheals; I do not think he has C diff - Monitor abdominal examination; on-going bowel function - Monitor leukocytosis; improving - Mobilize as tolerated   - Further management per primary service; we will follow   - Discharge Planning: He is doing much better; WBC improving, stop TPN, diet  advanced. I think if he does well today it is reasonable to send him home tomorrow (05/02) morning. I will arrange follow up and do Rx for anti-diarrheal today.   All of the above findings and recommendations were discussed with the patient, patient's family (mother at bedside), and the medical team, and all of their questions were answered to their expressed satisfaction.  -- Lynden Oxford, PA-C [facility] 03/02/2023, 8:07 AM M-F: 7am - 4pm

## 2023-03-02 NOTE — Progress Notes (Signed)
PROGRESS NOTE    Alec Campbell   EXB:[id number] DOB: 08/28/1971  DOA: 02/19/2023 Date of Service: 03/02/23 PCP: Alba Cory, MD     Brief Narrative / Hospital Course:  52 y.o male with significant PMH of developmental delay, seizure disorder, IDA, HLD,  bilateral Inguinal hernia, 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   Patient underwent uncomplicated right-sided colectomy for an ascending colon mass and cholecystectomy for cholelithiasis and chronic cholecystitis by Dr. Everlene Farrier on 02/10/2023.  He was discharged on 02/11/2023 in stable condition. Per pt's mother, patient developed nausea, vomiting and diarrhea on Saturday, April 13.  Was seen by Dr. Everlene Farrier in the office on 02/16/2023 and 2 L of fluids were ordered for 02/17/2023.  Patient's mother reported new fever and 2 syncopal episodes and possible myoclonic jerks night prior to arrival in ED, with brief LOC.  Unclear if episode was true seizures or not but per mother "he came back too quickly unlike his true seizure".  He was febrile and hypotensive with EMS    04/20: CT Abd/pelvis (+)multifocal pneumonia. Partial bowel obstruction and ?acute cystitis. Pt was treated with broad spectrum IV antibiotics and IV fluids per sepsis protocol and admitted to ICU with septic shock requiring pressors. General surgery following for SBO. 04/22: [facility] service assumed care. 04/23-04/29: improving, completed tx for pneumonia. Was on TPN, toelrating diet as of 05/01. Anticipate d/c home 05/02 if continues to tolerate po     Consultants:  General surgery  Pallitaive care Infectious disease   Procedures: none      ASSESSMENT & PLAN:   Principal Problem:   Septic shock (HCC) Active Problems:   Hyponatremia    Intellectual disability with epilepsy (HCC)   Seizure disorder (HCC)   Colonic mass   SBO (small bowel obstruction) (HCC)   Multifocal pneumonia   AKI (acute kidney injury) (HCC)   Hypokalemia   Diarrhea   Hypophosphatemia  Septic shock secondary to multifocal pneumonia-present on admission, resolved  Septic shock & sepsis physiology improved Patient has completed his pneumonia treatment and antibiotics have been discontinued today after 10 days of therapy with infectious disease input Infectious disease has d/c abx and s/o   Hypophosphatemia Refeeding syndrome Phos 1.4 02/22/2023 IV K-phos was given with improvement was on TPN, now doing well on po diet    Chronic diarrhea Negative C diff and GI panel. From discussion with infectious disease this is not concerning for C. difficile since patient tested negative on presentation. IV hydration --> po  Monitor electrolytes & replace PRN   Hypokalemia-improved K 3.1 on admission monitor potassium levels   AKI (acute kidney injury) RESOLVED with fluids. Likely pre-renal from volume depletion / dehydration, poor PO intake.   IV fluids --> po   Monitor BMP   Multifocal pneumonia Vs Aspiration PNA and ? UTI Acute respiratory failure with hypoxia due to PNA - resolved Complicated by Septic Shock which has resolved.  Initially treated with IV Vanc / Cefepime / Flagyl Has completed Zosyn course   SBO (small bowel obstruction) In the setting of recent right hemicolectomy d/t colonic mass, and cholecystectomy  Mgmt per General Surgery NG tube have been removed Advancement of diet according to surgical recommendation TPN --> po  Pain control PRN   Seizure disorder ? Syncope unclear if related to seizure  or above issues Seizures precaution Stop IV Keppra (?causing agitation) Discussed with Neurologist Resumed phenobarbital  Closely monitor levels    Oral herpes Infectious disease following Continue valacyclovir    Intellectual disability with epilepsy Additional support from family and staff as needed. Agitation -continue phenobarbital  Qtc is normal.   IV Haldol PRN if agitated despite above changes.     Hyponatremia likely secondary to dehydration - stable/baseline Monitor BMP.     DVT prophylaxis: lovenox  Pertinent IV fluids/nutrition: tolerating po, d/c TPN, no continuous iv fluids  Central lines / invasive devices: PICC, will remove tomorrow morning / prior to d/c   Code Status: FULL CODE ACP documentation reviewed: 03/02/23 - none on file   Current Admission Status: inpatient   TOC needs / Dispo plan: home  Barriers to discharge / significant pending items: if tolerating po can d/c home tomorrow              Subjective / Brief ROS:  Patient reports no concerns but mother provides other subjective information Mother concerned for loose stool No CP/SOB. Tolerating diet.  Reports no concerns w/ urination.   Family Communication: parents at bedside on rounds     Objective Findings:  Vitals:   03/01/23 2039 03/02/23 0418 03/02/23 0733 03/02/23 1117  BP: 121/73 130/78 119/67   Pulse: 90 98 90   Resp: 20 20 18    Temp: 99.1 F (37.3 C) 100.2 F (37.9 C) (!) 97 F (36.1 C)   TempSrc: Axillary  Axillary   SpO2: 100% 98% 95%   Weight:    68.2 kg  Height:        Intake/Output Summary (Last 24 hours) at 03/02/2023 1507 Last data filed at 03/02/2023 1505 Gross per 24 hour  Intake 480 ml  Output 200 ml  Net 280 ml   Filed Weights   02/27/23 1019 02/28/23 0926 03/02/23 1117  Weight: 67.6 kg 70.5 kg 68.2 kg    Examination:  Physical Exam Constitutional:      General: He is not in acute distress.    Appearance: He is not ill-appearing.  Cardiovascular:     Rate and Rhythm: Normal rate and regular rhythm.  Pulmonary:     Effort: Pulmonary effort is normal.     Breath sounds: Normal breath sounds.  Abdominal:     General: Bowel sounds are normal.      Palpations: Abdomen is soft.     Tenderness: There is no abdominal tenderness.     Comments: Surgical incisions healing   Skin:    General: Skin is warm and dry.  Neurological:     Mental Status: He is alert. Mental status is at baseline.  Psychiatric:        Behavior: Behavior normal.          Scheduled Medications:   carbamazepine  200 mg Oral BID   carbamazepine  400 mg Oral q AM   Chlorhexidine Gluconate Cloth  6 each Topical Daily   enoxaparin (LOVENOX) injection  40 mg Subcutaneous QHS   feeding supplement (KATE FARMS STANDARD 1.4)  325 mL Oral TID BM   fiber supplement (BANATROL TF)  60 mL Oral BID   loperamide  4 mg Oral TID   [START ON 03/03/2023] multivitamin with minerals  1 tablet Oral Daily   mouth rinse  15 mL Mouth Rinse 4 times per day   phenobarbital  64.8 mg Oral BID   sodium chloride flush  10-40 mL Intracatheter Q12H  valACYclovir  1,000 mg Oral BID    Continuous Infusions:  sodium chloride Stopped (02/27/23 2201)    PRN Medications:  bacitracin, diazepam, haloperidol lactate, ipratropium-albuterol, labetalol, liver oil-zinc oxide, morphine injection, mouth rinse, oxyCODONE, sodium chloride flush  Antimicrobials from admission:  Anti-infectives (From admission, onward)    Start     Dose/Rate Route Frequency Ordered Stop   02/28/23 1500  valACYclovir (VALTREX) tablet 1,000 mg        1,000 mg Oral 2 times daily 02/28/23 1414 03/10/23 0959   02/19/23 0715  piperacillin-tazobactam (ZOSYN) IVPB 3.375 g  Status:  Discontinued        3.375 g 12.5 mL/hr over 240 Minutes Intravenous Every 8 hours 02/19/23 0700 03/01/23 0934   02/19/23 0230  vancomycin (VANCOREADY) IVPB 1750 mg/350 mL        1,750 mg 175 mL/hr over 120 Minutes Intravenous  Once 02/19/23 0218 02/19/23 0452   02/19/23 0215  ceFEPIme (MAXIPIME) 2 g in sodium chloride 0.9 % 100 mL IVPB        2 g 200 mL/hr over 30 Minutes Intravenous  Once 02/19/23 0211 02/19/23 0238   02/19/23 0215   metroNIDAZOLE (FLAGYL) IVPB 500 mg        500 mg 100 mL/hr over 60 Minutes Intravenous  Once 02/19/23 0211 02/19/23 0330   02/19/23 0215  vancomycin (VANCOCIN) IVPB 1000 mg/200 mL premix  Status:  Discontinued        1,000 mg 200 mL/hr over 60 Minutes Intravenous  Once 02/19/23 0211 02/19/23 4098           Data Reviewed:  I have personally reviewed the following...  CBC: Recent Labs  Lab 02/26/23 0530 02/27/23 0910 02/28/23 0445 03/01/23 0437 03/02/23 0704  WBC 23.1* 18.8* 31.4* 22.9* 15.6*  NEUTROABS 17.9* 14.4* 26.1* 19.6* 12.4*  HGB 10.0* 10.2* 10.1* 8.3* 9.1*  HCT 29.3* 30.5* 29.7* 25.2* 26.5*  MCV 86.4 86.6 86.3 88.7 85.2  PLT 301 244 257 167 224   Basic Metabolic Panel: Recent Labs  Lab 02/24/23 0506 02/25/23 0417 02/26/23 0530 02/28/23 0445 03/01/23 0437  NA 136 131* 132* 129* 129*  K 3.8 3.7 3.8 4.2 3.9  CL 106 103 102 99 99  CO2 23 22 22  21* 27  GLUCOSE 145* 131* 131* 131* 139*  BUN 21* 16 19 21* 22*  CREATININE 0.61 0.52* 0.65 0.69 0.73  CALCIUM 8.0* 8.1* 8.6* 8.8* 8.2*  MG 1.8 1.8 2.1 1.7 1.7  PHOS 2.6  --   --  3.6 2.9   GFR: Estimated Creatinine Clearance: 104.2 mL/min (by C-G formula based on SCr of 0.73 mg/dL). Liver Function Tests: Recent Labs  Lab 02/24/23 0506 02/28/23 0445 03/01/23 0437  AST 30 32 36  ALT 25 32 24  ALKPHOS 93 172* 130*  BILITOT 0.3 0.5 0.6  PROT 5.7* 6.8 6.0*  ALBUMIN 2.4* 2.9* 2.8*   No results for input(s): "LIPASE", "AMYLASE" in the last 168 hours. No results for input(s): "AMMONIA" in the last 168 hours. Coagulation Profile: No results for input(s): "INR", "PROTIME" in the last 168 hours. Cardiac Enzymes: Recent Labs  Lab 03/01/23 0437  CKTOTAL 544*   BNP (last 3 results) No results for input(s): "PROBNP" in the last 8760 hours. HbA1C: No results for input(s): "HGBA1C" in the last 72 hours. CBG: Recent Labs  Lab 02/24/23 1748 02/25/23 0112 02/25/23 0552 02/25/23 1151 02/25/23 1734  GLUCAP  138* 142* 135* 105* 109*   Lipid Profile: Recent Labs  02/28/23 0445  TRIG 163*   Thyroid Function Tests: No results for input(s): "TSH", "T4TOTAL", "FREET4", "T3FREE", "THYROIDAB" in the last 72 hours. Anemia Panel: No results for input(s): "VITAMINB12", "FOLATE", "FERRITIN", "TIBC", "IRON", "RETICCTPCT" in the last 72 hours. Most Recent Urinalysis On File:     Component Value Date/Time   COLORURINE YELLOW (A) 02/19/2023 0630   APPEARANCEUR CLOUDY (A) 02/19/2023 0630   APPEARANCEUR Clear 10/28/2022 1530   LABSPEC 1.045 (H) 02/19/2023 0630   PHURINE 5.0 02/19/2023 0630   GLUCOSEU NEGATIVE 02/19/2023 0630   HGBUR NEGATIVE 02/19/2023 0630   BILIRUBINUR NEGATIVE 02/19/2023 0630   BILIRUBINUR Negative 10/28/2022 1530   KETONESUR NEGATIVE 02/19/2023 0630   PROTEINUR 100 (A) 02/19/2023 0630   NITRITE NEGATIVE 02/19/2023 0630   LEUKOCYTESUR NEGATIVE 02/19/2023 0630   Sepsis Labs: @LABRCNTIP (procalcitonin:4,lacticidven:4) Microbiology: Recent Results (from the past 240 hour(s))  OVA + PARASITE EXAM     Status: None   Collection Time: 02/21/23  2:30 AM   Specimen: Stool  Result Value Ref Range Status   OVA + PARASITE EXAM Final report  Final    Comment: (NOTE) These results were obtained using wet preparation(s) and trichrome stained smear. This test does not include testing for Cryptosporidium parvum, Cyclospora, or Microsporidia. Performed At: [facility] [address] [place], [place] [id number] Jolene Schimke MD WU:[id number]    Source of Sample STOOL  Final    Comment: Performed at [facility], [address] [place], [place] [address]      Radiology Studies last 3 days: DG Abd Portable 1V  Result Date: 03/01/2023 CLINICAL DATA:  Postoperative ileus. EXAM: PORTABLE ABDOMEN - 1 VIEW COMPARISON:  February 24, 2023. FINDINGS: Status post cholecystectomy. Slightly decreased small bowel dilatation is noted suggesting improving ileus. No colonic  dilatation is noted. IMPRESSION: Slightly decreased small bowel dilatation is noted suggesting improving ileus. Electronically Signed   By: Lupita Raider M.D.   On: 03/01/2023 08:12   CT ABDOMEN PELVIS W CONTRAST  Result Date: 02/28/2023 CLINICAL DATA:  Status post right hemicolectomy and cholecystectomy on 02/10/2023 for carcinoma of the ascending colon. Increased right lower quadrant abdominal pain and leukocytosis. EXAM: CT ABDOMEN AND PELVIS WITH CONTRAST TECHNIQUE: Multidetector CT imaging of the abdomen and pelvis was performed using the standard protocol following bolus administration of intravenous contrast. RADIATION DOSE REDUCTION: This exam was performed according to the departmental dose-optimization program which includes automated exposure control, adjustment of the mA and/or kV according to patient size and/or use of iterative reconstruction technique. CONTRAST:  OMNIPAQUE IOHEXOL 300 MG/ML  SOLN COMPARISON:  Multiple prior studies with the most recent dated 02/25/2023. FINDINGS: Lower chest: Slight improvement in posterior bibasilar pulmonary consolidative opacities likely representing atelectasis. Hepatobiliary: No focal liver abnormality is seen. Status post cholecystectomy. No biliary dilatation. No evidence to suggest bile leak. Pancreas: Unremarkable. No pancreatic ductal dilatation or surrounding inflammatory changes. Spleen: Normal in size without focal abnormality. Adrenals/Urinary Tract: Adrenal glands are unremarkable. Kidneys are normal, without renal calculi, focal lesion, or hydronephrosis. Bladder is unremarkable. Stomach/Bowel: Evaluation of bowel is hampered by respiratory motion artifact throughout much of the CT acquisition. Similar mild diffuse ileus pattern involving the remaining colon. No significant small bowel dilatation. No gross significant free intraperitoneal air. At the level of the ileocolic anastomosis, no gross evidence of anastomotic leak with potential  stable small foci or two of extraluminal air postoperatively. Vascular/Lymphatic: No significant vascular findings are present. No enlarged abdominal or pelvic lymph nodes. Reproductive: Prostate is  unremarkable. Other: Stable bilateral inguinal hernias containing fat, right greater than left. No ascites or focal abscess. Musculoskeletal: No acute or significant osseous findings. IMPRESSION: 1. Evaluation of bowel is hampered by respiratory motion artifact throughout much of the CT acquisition. Similar mild diffuse ileus pattern involving the remaining colon. At the level of the ileocolic anastomosis, no gross evidence of anastomotic leak with potential stable small foci or two of extraluminal air postoperatively. 2. No focal postoperative abscess identified. 3. Slight improvement in posterior bibasilar pulmonary consolidative opacities likely representing atelectasis. 4. Stable bilateral inguinal hernias containing fat, right greater than left. Electronically Signed   By: Irish Lack M.D.   On: 02/28/2023 09:36             LOS: 11 days    Time spent: 50 min    Sunnie Nielsen, DO [facility] 03/02/2023, 3:07 PM    Dictation software may have been used to generate the above note. Typos may occur and escape review in typed/dictated notes. Please contact Dr Lyn Hollingshead directly for clarity if needed.  Staff may message me via secure chat in Epic  but this may not receive an immediate response,  please page me for urgent matters!  If 7PM-7AM, please contact night coverage [email or web address]

## 2023-03-02 NOTE — Progress Notes (Addendum)
Daily Progress Note   Patient Name: Alec Campbell       Date: 03/02/2023 DOB: 02-03-1971  Age: 52 y.o. MRN#: [id number] Attending Physician: Sunnie Nielsen, DO Primary Care Physician: Alba Cory, MD Admit Date: 02/19/2023  Reason for Consultation/Follow-up: Establishing goals of care  Subjective: Notes and labs reviewed. In to see patient. His mother is at bedside. She states they slept well last night. Mother states he ate most of his eggs for breakfast, and he is feeling much better. TPN currently stopped. Patient currently denies complaint.   Mother advises they are looking to be able to go home tomorrow if he does well today.    We discussed his diagnosis, and GOC.  Created space and opportunity for patient  to explore thoughts and feelings regarding current medical information.   A detailed discussion was had today regarding advanced directives.  Concepts specific to code status, ventilator support, artifical feeding and hydration, were discussed.  The difference between an aggressive medical intervention path and a comfort care path was discussed.  Values and goals of care important to patient and family were attempted to be elicited.  Discussed limitations of medical interventions to prolong quality of life in some situations and discussed the concept of human mortality.    In regards to GOC, she states she wants any care indicated without restriction at this time, including CPR. She states patient lives with her, and they go everywhere together; she advises she cannot imagine life without him. She states at times she will take him to the movies and drop him off, and go back to get him.  She advises that when she and her husband are no longer able to care for him, he will go  live with her daughter (patient's sister). She states if her daughter were unable to bring him to live with her, her sons would bring him home with them. She states this has been discussed in detail to prevent his ever needing to move into a group home.   With discussion of scenarios, patient's mother does state she would not want him to live in a persistent vegetative state, as this would be an unacceptable QOL. She is unsure of boundaries or timelines to decision making regarding an unaccetable QOL.  She states she will consider her wishes, and will discuss with  her other children to be sure they know what decisions to make.      Length of Stay: 11  Current Medications: Scheduled Meds:   carbamazepine  200 mg Oral BID   carbamazepine  400 mg Oral q AM   Chlorhexidine Gluconate Cloth  6 each Topical Daily   enoxaparin (LOVENOX) injection  40 mg Subcutaneous QHS   feeding supplement (KATE FARMS STANDARD 1.4)  325 mL Oral TID BM   fiber supplement (BANATROL TF)  60 mL Oral BID   loperamide  4 mg Oral BID   [START ON 03/03/2023] multivitamin with minerals  1 tablet Oral Daily   mouth rinse  15 mL Mouth Rinse 4 times per day   phenobarbital  64.8 mg Oral BID   sodium chloride flush  10-40 mL Intracatheter Q12H   valACYclovir  1,000 mg Oral BID    Continuous Infusions:  sodium chloride Stopped (02/27/23 2201)   TPN ADULT (ION) Stopped (03/02/23 0924)    PRN Meds: bacitracin, diazepam, docusate sodium, haloperidol lactate, ipratropium-albuterol, labetalol, liver oil-zinc oxide, morphine injection, mouth rinse, oxyCODONE, sodium chloride flush  Physical Exam Pulmonary:     Effort: Pulmonary effort is normal.  Neurological:     Mental Status: He is alert.             Vital Signs: BP 119/67 (BP Location: Left Arm)   Pulse 90   Temp (!) 97 F (36.1 C) (Axillary)   Resp 18   Ht 5\' 10"  (1.778 m)   Wt 70.5 kg   SpO2 95%   BMI 22.31 kg/m  SpO2: SpO2: 95 % O2 Device: O2 Device:  Room Air O2 Flow Rate:    Intake/output summary:  Intake/Output Summary (Last 24 hours) at 03/02/2023 1049 Last data filed at 03/01/2023 2025 Gross per 24 hour  Intake 480 ml  Output 200 ml  Net 280 ml   LBM: Last BM Date : 03/02/23 Baseline Weight: Weight: 76.7 kg Most recent weight: Weight: 70.5 kg   Patient Active Problem List   Diagnosis Date Noted   Hypophosphatemia 02/22/2023   AKI (acute kidney injury) (HCC) 02/21/2023   Hypokalemia 02/21/2023   Diarrhea 02/21/2023   Multifocal pneumonia 02/20/2023   Septic shock (HCC) 02/19/2023   SBO (small bowel obstruction) (HCC) 02/19/2023   S/P partial resection of colon 02/10/2023   Calculus of gallbladder with chronic cholecystitis without obstruction 02/10/2023   Colonic mass 01/13/2023   Gastritis, Helicobacter pylori 01/02/2023   Colon polyps 01/02/2023   Absolute anemia 12/30/2022   Adenomatous polyp of colon 12/30/2022   History of Helicobacter pylori infection 12/10/2022   History of anemia 12/10/2022   Inguinal hernia with gangrene, recurrent bilateral 06/17/2020   Cold sore 02/13/2016   Vitamin D deficiency 07/20/2015   Intellectual disability with epilepsy (HCC) 07/16/2015   Seizure disorder (HCC) 07/16/2015   Seasonal allergic rhinitis 07/16/2015   Dyslipidemia 07/16/2015   Hyponatremia 06/13/2015   Abnormal blood sugar 06/19/2008    Palliative Care Assessment & Plan     Recommendations/Plan: Full code/ full scope treatment.   Code Status:    Code Status Orders  (From admission, onward)           Start     Ordered   02/19/23 0418  Full code  Continuous       Question:  By:  Answer:  Consent: discussion documented in EHR   02/19/23 0420           Code Status History  Date Active Date Inactive Code Status Order ID Comments User Context   02/10/2023 1214 02/11/2023 1903 Full Code [id number]  Leafy Ro, MD Inpatient       Prognosis:  Unable to determine   Thank you for allowing  the Palliative Medicine Team to assist in the care of this patient.    Morton Stall, NP  Please contact Palliative Medicine Team phone at [phone] for questions and concerns.

## 2023-03-02 NOTE — TOC Progression Note (Signed)
Transition of Care [facility]) - Progression Note    Patient Details  Name: Alec Campbell MRN: [id number] Date of Birth: 1971-08-12  Transition of Care [facility]) CM/SW Contact  Chapman Fitch, RN Phone Number: 03/02/2023, 9:15 AM  Clinical Narrative:     Incontinence supply order sent to [facility] with [employer]   Expected Discharge Plan: Home/Self Care Barriers to Discharge: Continued Medical Work up  Expected Discharge Plan and Services                                               Social Determinants of Health (SDOH) Interventions SDOH Screenings   Food Insecurity: No Food Insecurity (02/19/2023)  Housing: Low Risk  (02/19/2023)  Transportation Needs: No Transportation Needs (02/19/2023)  Utilities: Not At Risk (02/19/2023)  Alcohol Screen: Low Risk  (04/15/2022)  Depression (PHQ2-9): Low Risk  (01/13/2023)  Financial Resource Strain: Low Risk  (04/15/2022)  Physical Activity: Insufficiently Active (04/15/2022)  Social Connections: Socially Isolated (04/15/2022)  Stress: No Stress Concern Present (04/15/2022)  Tobacco Use: Low Risk  (02/26/2023)    Readmission Risk Interventions    02/28/2023   10:19 AM  Readmission Risk Prevention Plan  Transportation Screening Complete  PCP or Specialist Appt within 3-5 Days Complete  Social Work Consult for Recovery Care Planning/Counseling Complete  Medication Review Oceanographer) Complete

## 2023-03-03 DIAGNOSIS — R6521 Severe sepsis with septic shock: Secondary | ICD-10-CM | POA: Diagnosis not present

## 2023-03-03 DIAGNOSIS — A419 Sepsis, unspecified organism: Secondary | ICD-10-CM | POA: Diagnosis not present

## 2023-03-03 LAB — COMPREHENSIVE METABOLIC PANEL
ALT: 41 U/L (ref 0–44)
AST: 40 U/L (ref 15–41)
Albumin: 3 g/dL — ABNORMAL LOW (ref 3.5–5.0)
Alkaline Phosphatase: 146 U/L — ABNORMAL HIGH (ref 38–126)
Anion gap: 7 (ref 5–15)
BUN: 19 mg/dL (ref 6–20)
CO2: 24 mmol/L (ref 22–32)
Calcium: 9 mg/dL (ref 8.9–10.3)
Chloride: 99 mmol/L (ref 98–111)
Creatinine, Ser: 0.68 mg/dL (ref 0.61–1.24)
GFR, Estimated: >60 mL/min (ref >60)
Glucose, Bld: 98 mg/dL (ref 70–99)
Potassium: 3.9 mmol/L (ref 3.5–5.1)
Sodium: 130 mmol/L — ABNORMAL LOW (ref 135–145)
Total Bilirubin: 0.5 mg/dL (ref 0.3–1.2)
Total Protein: 6.7 g/dL (ref 6.5–8.1)

## 2023-03-03 LAB — TRIGLYCERIDES: Triglycerides: 149 mg/dL (ref <150)

## 2023-03-03 MED ORDER — ADULT MULTIVITAMIN W/MINERALS CH: 1.0000 | ORAL_TABLET | Freq: Every day | ORAL | Status: AC

## 2023-03-03 MED ORDER — BACITRACIN ZINC 500 UNIT/GM EX OINT: TOPICAL_OINTMENT | CUTANEOUS | 0 refills | Status: DC | PRN

## 2023-03-03 MED ORDER — BANATROL TF EN LIQD: 60.0000 mL | Freq: Two times a day (BID) | ENTERAL | 0 refills | Status: AC

## 2023-03-03 MED ORDER — KATE FARMS STANDARD 1.4 PO LIQD: 325.0000 mL | Freq: Two times a day (BID) | ORAL | 0 refills | Status: AC

## 2023-03-03 NOTE — TOC Transition Note (Signed)
Transition of Care [facility]) - CM/SW Discharge Note   Patient Details  Name: Alec Campbell MRN: [id number] Date of Birth: [birth year]  Transition of Care [facility]) CM/SW Contact:  Chapman Fitch, RN Phone Number: 03/03/2023, 10:18 AM   Clinical Narrative:    Patient with order for incontinence supplies.  Notified Barbara Cower with [employer] that patient is discharging today      Barriers to Discharge: Continued Medical Work up   Patient Goals and CMS Choice      Discharge Placement                         Discharge Plan and Services Additional resources added to the After Visit Summary for                                       Social Determinants of Health (SDOH) Interventions SDOH Screenings   Food Insecurity: No Food Insecurity (02/19/2023)  Housing: Low Risk  (02/19/2023)  Transportation Needs: No Transportation Needs (02/19/2023)  Utilities: Not At Risk (02/19/2023)  Alcohol Screen: Low Risk  (04/15/2022)  Depression (PHQ2-9): Low Risk  (01/13/2023)  Financial Resource Strain: Low Risk  (04/15/2022)  Physical Activity: Insufficiently Active (04/15/2022)  Social Connections: Socially Isolated (04/15/2022)  Stress: No Stress Concern Present (04/15/2022)  Tobacco Use: Low Risk  (02/26/2023)     Readmission Risk Interventions    02/28/2023   10:19 AM  Readmission Risk Prevention Plan  Transportation Screening Complete  PCP or Specialist Appt within 3-5 Days Complete  Social Work Consult for Recovery Care Planning/Counseling Complete  Medication Review Oceanographer) Complete

## 2023-03-03 NOTE — Discharge Summary (Signed)
Physician Discharge Summary   Patient: Alec Campbell MRN: [id number]  DOB: May 07, 1971   Admit:     Date of Admission: 02/19/2023 Admitted from: home   Discharge: Date of discharge: 03/03/23 Disposition: Home health Condition at discharge: good  CODE STATUS: FULL CODE      Discharge Physician: Sunnie Nielsen, DO [facility]     PCP: Alba Cory, MD  Recommendations for Outpatient Follow-up:  Follow up with PCP Alba Cory, MD in 1-2 weeks Please obtain labs/tests: consider CBC, BMP if needed Please follow up on the following pending results: none PCP AND OTHER OUTPATIENT PROVIDERS: SEE BELOW FOR SPECIFIC DISCHARGE INSTRUCTIONS PRINTED FOR PATIENT IN ADDITION TO GENERIC AVS PATIENT INFO     Discharge Instructions     Diet - low sodium heart healthy   Complete by: As directed    Increase activity slowly   Complete by: As directed          Discharge Diagnoses: Principal Problem:   Septic shock (HCC) Active Problems:   Hyponatremia   Intellectual disability with epilepsy (HCC)   Seizure disorder (HCC)   Colonic mass   SBO (small bowel obstruction) (HCC)   Multifocal pneumonia   AKI (acute kidney injury) (HCC)   Hypokalemia   Diarrhea   Hypophosphatemia       Hospital Course: 52 y.o male with significant PMH of developmental delay, seizure disorder, IDA, HLD,  bilateral Inguinal hernia, 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   Patient underwent uncomplicated right-sided colectomy for an ascending colon mass and cholecystectomy for cholelithiasis and chronic cholecystitis by Dr. Everlene Farrier on 02/10/2023.  He was discharged on 02/11/2023 in stable condition. Per pt's mother, patient developed nausea, vomiting  and diarrhea on Saturday, April 13.  Was seen by Dr. Everlene Farrier in the office on 02/16/2023 and 2 L of fluids were ordered for 02/17/2023.  Patient's mother reported new fever and 2 syncopal episodes and possible myoclonic jerks night prior to arrival in ED, with brief LOC.  Unclear if episode was true seizures or not but per mother "he came back too quickly unlike his true seizure".  He was febrile and hypotensive with EMS    04/20: CT Abd/pelvis (+)multifocal pneumonia. Partial bowel obstruction and ?acute cystitis. Pt was treated with broad spectrum IV antibiotics and IV fluids per sepsis protocol and admitted to ICU with septic shock requiring pressors. General surgery following for SBO. 04/22: [facility] service assumed care. 04/23-04/29: improving, completed tx for pneumonia. Was on TPN, toelrating diet as of 05/01. Anticipate d/c home 05/02 if continues to tolerate po     Consultants:  General surgery  Pallitaive care Infectious disease   Procedures: none      ASSESSMENT & PLAN:   Septic shock secondary to multifocal pneumonia-present on admission, resolved  Septic shock & sepsis physiology improved Patient has completed his pneumonia treatment and antibiotics have been discontinued today after 10 days of therapy with infectious disease input Infectious disease has d/c abx and s/o   Hypophosphatemia Refeeding syndrome Phos 1.4 02/22/2023 IV K-phos was given with improvement was on TPN, now doing well on po diet    Chronic diarrhea Negative C diff and GI panel. From discussion with infectious disease this is not concerning for C. difficile since patient tested negative on presentation. IV  hydration --> po  Monitor electrolytes & replace PRN   Hypokalemia-improved K 3.1 on admission monitor potassium levels   AKI (acute kidney injury) RESOLVED with fluids. Likely pre-renal from volume depletion / dehydration, poor PO intake.   IV fluids --> po   Monitor BMP   Multifocal  pneumonia Vs Aspiration PNA and ? UTI Acute respiratory failure with hypoxia due to PNA - resolved Complicated by Septic Shock which has resolved.  Initially treated with IV Vanc / Cefepime / Flagyl Has completed Zosyn course   SBO (small bowel obstruction) In the setting of recent right hemicolectomy d/t colonic mass, and cholecystectomy  TPN --> po  Pain control PRN   Seizure disorder ? Syncope unclear if related to seizure or above issues Seizures precaution Stop IV Keppra (?causing agitation) Discussed with Neurologist Resumed phenobarbital  Closely monitor levels    Oral herpes Infectious disease following Continue valacyclovir   Intellectual disability with epilepsy Additional support from family and staff as needed. Agitation -continue phenobarbital  Qtc is normal.   IV Haldol PRN if agitated despite above changes.     Hyponatremia likely secondary to dehydration - stable/baseline Monitor BMP.              Discharge Instructions  Allergies as of 03/03/2023   No Known Allergies      Medication List     TAKE these medications    bacitracin ointment Apply topically as needed for wound care.   carbamazepine 200 MG tablet Commonly known as: TEGRETOL TAKE 2 TABLETS BY MOUTH IN THE MORNING, THEN TAKE  1 TABLET AT LUNCH AND TAKE 1 AT NIGHT   chlorpheniramine 4 MG tablet Commonly known as: CHLOR-TRIMETON Take 4 mg by mouth 2 (two) times daily as needed for allergies.   feeding supplement (KATE FARMS STANDARD 1.4) Liqd liquid Take 325 mLs by mouth 2 (two) times daily between meals for 14 days.   fiber supplement (BANATROL TF) liquid Take 60 mLs by mouth in the morning and at bedtime for 14 days.   loperamide 2 MG capsule Commonly known as: IMODIUM Take 2 capsules (4 mg total) by mouth 2 (two) times daily.   multivitamin with minerals Tabs tablet Take 1 tablet by mouth daily. Start taking on: Mar 04, 2023   ondansetron 4 MG disintegrating  tablet Commonly known as: ZOFRAN-ODT Take 1 tablet (4 mg total) by mouth every 6 (six) hours as needed for nausea.   PHENobarbital 64.8 MG tablet Commonly known as: LUMINAL Take 1 tablet (64.8 mg total) by mouth 2 (two) times daily.   PreviDent 5000 Sensitive 1.1-5 % Gel Generic drug: Sod Fluoride-Potassium Nitrate Place onto teeth 2 (two) times daily.   rosuvastatin 10 MG tablet Commonly known as: Crestor Take 1 tablet (10 mg total) by mouth daily.   valACYclovir 1000 MG tablet Commonly known as: VALTREX TAKE 1 TABLET BY MOUTH TWICE DAILY AS NEEDED FOR  OUTBREAK               Durable Medical Equipment  (From admission, onward)           Start     Ordered   02/27/23 1052  For home use only DME Other see comment  Once       Comments: Incontinence supplies  Question:  Length of Need  Answer:  6 Months   02/27/23 1051             Follow-up Information     Pabon, Merri Ray, MD. Nyra Capes  on 03/09/2023.   Specialty: General Surgery Why: follow up right colectomy, hospitalization.Go at 10:00am. Contact information: [address] [place] [address] [phone]                 No Known Allergies   Subjective: pt has no concerns, mother is present and she states "we are ready to go!"    Discharge Exam: BP (!) 117/91 (BP Location: Left Arm)   Pulse (!) 103   Temp (!) 97.5 F (36.4 C) (Oral)   Resp 18   Ht 5\' 10"  (1.778 m)   Wt 66.5 kg   SpO2 100%   BMI 21.04 kg/m  General: Pt is alert, awake, not in acute distress Cardiovascular: RRR, S1/S2 +, no rubs, no gallops Respiratory: CTA bilaterally, no wheezing, no rhonchi Abdominal: Soft, NT, ND, bowel sounds + Extremities: no edema, no cyanosis     The results of significant diagnostics from this hospitalization (including imaging, microbiology, ancillary and laboratory) are listed below for reference.     Microbiology: No results found for this or any previous visit (from  the past 240 hour(s)).   Labs: BNP (last 3 results) No results for input(s): "BNP" in the last 8760 hours. Basic Metabolic Panel: Recent Labs  Lab 02/25/23 0417 02/26/23 0530 02/28/23 0445 03/01/23 0437 03/03/23 0408  NA 131* 132* 129* 129* 130*  K 3.7 3.8 4.2 3.9 3.9  CL 103 102 99 99 99  CO2 22 22 21* 27 24  GLUCOSE 131* 131* 131* 139* 98  BUN 16 19 21* 22* 19  CREATININE 0.52* 0.65 0.69 0.73 0.68  CALCIUM 8.1* 8.6* 8.8* 8.2* 9.0  MG 1.8 2.1 1.7 1.7  --   PHOS  --   --  3.6 2.9  --    Liver Function Tests: Recent Labs  Lab 02/28/23 0445 03/01/23 0437 03/03/23 0408  AST 32 36 40  ALT 32 24 41  ALKPHOS 172* 130* 146*  BILITOT 0.5 0.6 0.5  PROT 6.8 6.0* 6.7  ALBUMIN 2.9* 2.8* 3.0*   No results for input(s): "LIPASE", "AMYLASE" in the last 168 hours. No results for input(s): "AMMONIA" in the last 168 hours. CBC: Recent Labs  Lab 02/26/23 0530 02/27/23 0910 02/28/23 0445 03/01/23 0437 03/02/23 0704  WBC 23.1* 18.8* 31.4* 22.9* 15.6*  NEUTROABS 17.9* 14.4* 26.1* 19.6* 12.4*  HGB 10.0* 10.2* 10.1* 8.3* 9.1*  HCT 29.3* 30.5* 29.7* 25.2* 26.5*  MCV 86.4 86.6 86.3 88.7 85.2  PLT 301 244 257 167 224   Cardiac Enzymes: Recent Labs  Lab 03/01/23 0437  CKTOTAL 544*   BNP: Invalid input(s): "POCBNP" CBG: Recent Labs  Lab 02/24/23 1748 02/25/23 0112 02/25/23 0552 02/25/23 1151 02/25/23 1734  GLUCAP 138* 142* 135* 105* 109*   D-Dimer No results for input(s): "DDIMER" in the last 72 hours. Hgb A1c No results for input(s): "HGBA1C" in the last 72 hours. Lipid Profile Recent Labs    03/03/23 0408  TRIG 149   Thyroid function studies No results for input(s): "TSH", "T4TOTAL", "T3FREE", "THYROIDAB" in the last 72 hours.  Invalid input(s): "FREET3" Anemia work up No results for input(s): "VITAMINB12", "FOLATE", "FERRITIN", "TIBC", "IRON", "RETICCTPCT" in the last 72 hours. Urinalysis    Component Value Date/Time   COLORURINE YELLOW (A) 02/19/2023  0630   APPEARANCEUR CLOUDY (A) 02/19/2023 0630   APPEARANCEUR Clear 10/28/2022 1530   LABSPEC 1.045 (H) 02/19/2023 0630   PHURINE 5.0 02/19/2023 0630   GLUCOSEU NEGATIVE 02/19/2023 0630   HGBUR NEGATIVE  02/19/2023 0630   BILIRUBINUR NEGATIVE 02/19/2023 0630   BILIRUBINUR Negative 10/28/2022 1530   KETONESUR NEGATIVE 02/19/2023 0630   PROTEINUR 100 (A) 02/19/2023 0630   NITRITE NEGATIVE 02/19/2023 0630   LEUKOCYTESUR NEGATIVE 02/19/2023 0630   Sepsis Labs Recent Labs  Lab 02/27/23 0910 02/28/23 0445 03/01/23 0437 03/02/23 0704  WBC 18.8* 31.4* 22.9* 15.6*   Microbiology No results found for this or any previous visit (from the past 240 hour(s)). Imaging DG Abd 1 View  Result Date: 02/20/2023 CLINICAL DATA:  Abdominal pain EXAM: ABDOMEN - 1 VIEW COMPARISON:  X-ray earlier 02/20/2023 and older FINDINGS: NG tube tip overlying the fundus of the stomach. Surgical clips in the right upper quadrant as well as bowel surgical changes. There is some air along nondilated loops colon on the left side. There are some loops of air in dilated small bowel as seen previously central and left midabdomen measuring up to 4.4 cm. This is consistent with a obstruction. Slight decrease in air-filled dilated loops in the right midabdomen. No obvious free air on these portable supine radiographs. Overlapping cardiac leads IMPRESSION: Persistent moderately dilated loops of small bowel in the midabdomen. Continued air in nondilated colon. Slight decrease in the level of small-bowel dilatation in the right midabdomen. Enteric tube.  Postop changes Electronically Signed   By: Karen Kays M.D.   On: 02/20/2023 20:10   DG Abd 1 View  Result Date: 02/20/2023 CLINICAL DATA:  [id number] SBO (small bowel obstruction) [id number] EXAM: ABDOMEN - 1 VIEW COMPARISON:  the previous day's study FINDINGS: Gastric tube has been advanced into the decompressed stomach. Some decrease in dilatation of distended bowel loops in the right and  lower abdomen since previous exam. Right abdominal surgical clips stable. Regional bones unremarkable. IMPRESSION: Gastric tube advanced into the decompressed stomach, with partial decompression of bowel. Electronically Signed   By: Corlis Leak M.D.   On: 02/20/2023 09:31   DG Abdomen 1 View  Result Date: 02/19/2023 CLINICAL DATA:  52 year old male status post nasogastric tube placement. EXAM: ABDOMEN - 1 VIEW COMPARISON:  Abdominal radiograph 02/19/2023. FINDINGS: Nasogastric tube has been advanced slightly, now with side port approximately 3 cm distal to the gastroesophageal junction. Several dilated loops of gas-filled small bowel remain evident in the visualized upper abdomen measuring up to 5.4 cm in diameter. Surgical clips project over the right upper quadrant of the abdomen, likely from prior cholecystectomy. IMPRESSION: 1. Support apparatus, as above. 2. Persistent dilatation of small-bowel loops concerning for small bowel obstruction, as above. Electronically Signed   By: Trudie Reed M.D.   On: 02/19/2023 07:41   DG Abd 1 View  Result Date: 02/19/2023 CLINICAL DATA:  52 year old male status post nasogastric tube placement. EXAM: ABDOMEN - 1 VIEW COMPARISON:  No priors. FINDINGS: Nasogastric tube noted with tip in the proximal stomach and side port just proximal to the distal gastroesophageal junction. Abdomen is incompletely imaged, but there appear to be multiple dilated loops of small bowel which are fluid-filled, concerning for bowel obstruction, measuring up to proximally 5.2 cm in diameter in the right upper quadrant of the abdomen. IMPRESSION: 1. Nasogastric tube in position, as above. Advancement of the tube approximately 10 cm for more optimal placement is suggested. 2. Dilated loops of small bowel concerning for small bowel obstruction, as above. Electronically Signed   By: Trudie Reed M.D.   On: 02/19/2023 06:33   CT CHEST ABDOMEN PELVIS W CONTRAST  Result Date:  02/19/2023 CLINICAL DATA:  Sepsis. EXAM:  CT CHEST, ABDOMEN, AND PELVIS WITH CONTRAST TECHNIQUE: Multidetector CT imaging of the chest, abdomen and pelvis was performed following the standard protocol during bolus administration of intravenous contrast. RADIATION DOSE REDUCTION: This exam was performed according to the departmental dose-optimization program which includes automated exposure control, adjustment of the mA and/or kV according to patient size and/or use of iterative reconstruction technique. CONTRAST:  OMNIPAQUE IOHEXOL 300 MG/ML  SOLN COMPARISON:  January 28, 2023 FINDINGS: CT CHEST FINDINGS Cardiovascular: No significant vascular findings. Normal heart size. No pericardial effusion. Mediastinum/Nodes: No enlarged mediastinal, hilar, or axillary lymph nodes. Thyroid gland, trachea, and esophagus demonstrate no significant findings. Lungs/Pleura: Marked severity patchy infiltrates are seen throughout the bilateral lower lobes. Mild right middle lobe infiltrate is also noted. There is no evidence of a pleural effusion or pneumothorax. Musculoskeletal: No chest wall mass or suspicious bone lesions identified. CT ABDOMEN PELVIS FINDINGS Hepatobiliary: No focal liver abnormality is seen. Status post cholecystectomy. No biliary dilatation. Pancreas: Unremarkable. No pancreatic ductal dilatation or surrounding inflammatory changes. Spleen: Normal in size without focal abnormality. Adrenals/Urinary Tract: Adrenal glands are unremarkable. Kidneys are normal, without renal calculi, focal lesion, or hydronephrosis. There is mild diffuse urinary bladder wall thickening. Stomach/Bowel: Stomach is within normal limits. Surgically anastomosed bowel is seen within the anterior aspect of the right upper quadrant, consistent with the patient's history of prior laparoscopic right colectomy. Surgical clips are also seen within the mid right abdomen. Numerous dilated small bowel loops are seen throughout the abdomen and  pelvis (maximum small bowel diameter of approximately 4.3 cm). A gradual transition zone is seen within the lateral aspect of the mid to upper right abdomen. Vascular/Lymphatic: Aortic atherosclerosis. No enlarged abdominal or pelvic lymph nodes. Reproductive: Mild prostate gland enlargement is seen. Other: A 4.2 cm x 1.8 cm fat containing right inguinal hernia is seen. An additional 2.8 cm x 1.2 cm fat containing left inguinal hernia is noted. There is a 4.4 cm x 2.2 cm fluid-filled left scrotal hernia. No abdominopelvic ascites. Musculoskeletal: No acute or significant osseous findings. IMPRESSION: 1. Marked severity bilateral lower lobe and mild right middle lobe infiltrate, consistent with multifocal pneumonia. 2. Findings consistent with a partial small bowel obstruction. 3. Evidence of prior cholecystectomy and right colectomy. 4. Bilateral fat containing inguinal hernias. 5. Fluid-filled left scrotal hernia. 6. Mild prostate gland enlargement. 7. Mild diffuse urinary bladder wall thickening which may be, in part, secondary to chronic bladder outlet obstruction. Correlation with urinalysis is recommended to exclude the presence of acute cystitis. 8. Aortic atherosclerosis. Aortic Atherosclerosis (ICD10-I70.0). Electronically Signed   By: Aram Candela M.D.   On: 02/19/2023 03:43   DG Chest Port 1 View  Result Date: 02/19/2023 CLINICAL DATA:  Sepsis EXAM: PORTABLE CHEST 1 VIEW COMPARISON:  01/22/2019 FINDINGS: Lung volumes are small and there is left basilar atelectasis. No pneumothorax or pleural effusion. Cardiac size within normal limits. Pulmonary vascularity is normal. No acute bone abnormality. IMPRESSION: 1. Pulmonary hypoinflation. Electronically Signed   By: Helyn Numbers M.D.   On: 02/19/2023 02:42      Time coordinating discharge: over 30 minutes  SIGNED:  Sunnie Nielsen DO [facility]

## 2023-03-03 NOTE — Plan of Care (Signed)
Adequate for discharge. Resolve Care Plan.  Alec Campbell V Devonne Kitchen  

## 2023-03-04 ENCOUNTER — Telehealth: Payer: Self-pay | Admitting: *Deleted

## 2023-03-04 NOTE — Transitions of Care (Post Inpatient/ED Visit) (Signed)
03/04/2023  Name: Alec Campbell MRN: [id number] DOB: 1971-07-28  Today's TOC FU Call Status: Today's TOC FU Call Status:: Successful TOC FU Call Competed TOC FU Call Complete Date: 03/04/23  Transition Care Management Follow-up Telephone Call Date of Discharge: 03/03/23 Discharge Facility: [facility]) Type of Discharge: Inpatient Admission Primary Inpatient Discharge Diagnosis:: Sepsis with hypotension How have you been since you were released from the hospital?: Better Any questions or concerns?: No  Items Reviewed: Did you receive and understand the discharge instructions provided?: Yes Medications obtained,verified, and reconciled?: Yes (Medications Reviewed) Any new allergies since your discharge?: No Dietary orders reviewed?: Yes Type of Diet Ordered:: REG Do you have support at home?: Yes People in Home: parent(s) Name of Support/Comfort Primary Source: Sharron  Medications Reviewed Today: Medications Reviewed Today     Reviewed by Luella Cook, RN (Case Manager) on 03/04/23 at 1353  Med List Status: <None>   Medication Order Taking? Sig Documenting Provider Last Dose Status Informant  bacitracin ointment [id number] Yes Apply topically as needed for wound care. Sunnie Nielsen, DO Taking Active   carbamazepine (TEGRETOL) 200 MG tablet [id number] Yes TAKE 2 TABLETS BY MOUTH IN THE MORNING, THEN TAKE  1 TABLET AT LUNCH AND TAKE 1 AT Mittie Bodo, Danna Hefty, MD Taking Active   chlorpheniramine (CHLOR-TRIMETON) 4 MG tablet [id number] Yes Take 4 mg by mouth 2 (two) times daily as needed for allergies. [provider] Taking Active Mother  fiber supplement, Winona Legato, liquid [id number] Yes Take 60 mLs by mouth in the morning and at bedtime for 14 days. Sunnie Nielsen, DO Taking Active   loperamide (IMODIUM) 2 MG capsule [id number] Yes Take 2 capsules (4 mg total) by mouth 2 (two) times daily. Donovan Kail, PA-C Taking Active    Multiple Vitamin (MULTIVITAMIN WITH MINERALS) TABS tablet [id number] Yes Take 1 tablet by mouth daily. Sunnie Nielsen, DO Taking Active   Nutritional Supplements (FEEDING SUPPLEMENT, KATE FARMS STANDARD 1.4,) LIQD liquid [id number] Yes Take 325 mLs by mouth 2 (two) times daily between meals for 14 days. Sunnie Nielsen, DO Taking Active   ondansetron (ZOFRAN-ODT) 4 MG disintegrating tablet [id number] Yes Take 1 tablet (4 mg total) by mouth every 6 (six) hours as needed for nausea. Donovan Kail, PA-C Taking Active   PHENobarbital (LUMINAL) 64.8 MG tablet [id number] Yes Take 1 tablet (64.8 mg total) by mouth 2 (two) times daily. Alba Cory, MD Taking Active   PREVIDENT 5000 SENSITIVE 1.1-5 % GEL [id number] Yes Place onto teeth 2 (two) times daily. [provider] Taking Active   rosuvastatin (CRESTOR) 10 MG tablet [id number] Yes Take 1 tablet (10 mg total) by mouth daily. Alba Cory, MD Taking Active   valACYclovir (VALTREX) 1000 MG tablet [id number] Yes TAKE 1 TABLET BY MOUTH TWICE DAILY AS NEEDED FOR  Elvera Maria, Danna Hefty, MD Taking Active             Home Care and Equipment/Supplies: Were Home Health Services Ordered?: No Any new equipment or medical supplies ordered?: No  Functional Questionnaire: Do you need assistance with bathing/showering or dressing?: Yes Do you need assistance with meal preparation?: Yes Do you need assistance with eating?: No Do you have difficulty maintaining continence: Yes Do you need assistance with getting out of bed/getting out of a chair/moving?: Yes Do you have difficulty managing or taking your medications?: No  Follow up appointments reviewed: PCP Follow-up appointment confirmed?: Yes Date of PCP follow-up appointment?: 03/08/23 Follow-up Provider: Danelle Berry [facility]  Follow-up appointment confirmed?: Yes Date of Specialist follow-up appointment?: 03/09/23 Follow-Up Specialty Provider:: Dr Everlene Farrier, [id number],  Dr Normand Sloop 1:30 Do you need transportation to your follow-up appointment?: No Do you understand care options if your condition(s) worsen?: Yes-patient verbalized understanding  SDOH Interventions Today    Flowsheet Row Most Recent Value  SDOH Interventions   Food Insecurity Interventions Intervention Not Indicated  Housing Interventions Intervention Not Indicated  Transportation Interventions Intervention Not Indicated      Interventions Today    Flowsheet Row Most Recent Value  General Interventions   General Interventions Discussed/Reviewed General Interventions Discussed, General Interventions Reviewed  Doctor Visits Discussed/Reviewed Doctor Visits Reviewed, Doctor Visits Discussed      TOC Interventions Today    Flowsheet Row Most Recent Value  TOC Interventions   TOC Interventions Discussed/Reviewed TOC Interventions Discussed, TOC Interventions Reviewed, Arranged PCP follow up within 7 days/Care Guide scheduled       Gean Maidens BSN RN [facility] [phone]

## 2023-03-04 NOTE — Transitions of Care (Post Inpatient/ED Visit) (Signed)
   03/04/2023  Name: Alec Campbell MRN: [id number] DOB: 1971/09/01  Today's TOC FU Call Status: Today's TOC FU Call Status:: Unsuccessul Call (1st Attempt) Unsuccessful Call (1st Attempt) Date: 03/04/23  Attempted to reach the patient regarding the most recent Inpatient/ED visit.  Follow Up Plan: Additional outreach attempts will be made to reach the patient to complete the Transitions of Care (Post Inpatient/ED visit) call.   Gean Maidens BSN RN [facility] [phone]

## 2023-03-07 ENCOUNTER — Telehealth: Payer: Self-pay | Admitting: Surgery

## 2023-03-07 NOTE — Progress Notes (Unsigned)
Patient ID: Alec Campbell, male    DOB: 10-31-1971, 52 y.o.   MRN: [id number]  PCP: Alba Cory, MD  No chief complaint on file.   Subjective:   Alec Campbell is a 52 y.o. male, presents to clinic with CC of the following:  HPI  PCP: Alba Cory, MD   Recommendations for Outpatient Follow-up:  Follow up with PCP Alba Cory, MD in 1-2 weeks Please obtain labs/tests: consider CBC, BMP if needed Please follow up on the following pending results: none PCP AND OTHER OUTPATIENT PROVIDERS: SEE BELOW FOR SPECIFIC DISCHARGE INSTRUCTIONS PRINTED FOR PATIENT IN ADDITION TO GENERIC AVS PATIENT INFO   Patient Active Problem List   Diagnosis Date Noted   Hypophosphatemia 02/22/2023   AKI (acute kidney injury) (HCC) 02/21/2023   Hypokalemia 02/21/2023   Diarrhea 02/21/2023   Multifocal pneumonia 02/20/2023   Septic shock (HCC) 02/19/2023   SBO (small bowel obstruction) (HCC) 02/19/2023   S/P partial resection of colon 02/10/2023   Calculus of gallbladder with chronic cholecystitis without obstruction 02/10/2023   Colonic mass 01/13/2023   Gastritis, Helicobacter pylori 01/02/2023   Colon polyps 01/02/2023   Absolute anemia 12/30/2022   Adenomatous polyp of colon 12/30/2022   History of Helicobacter pylori infection 12/10/2022   History of anemia 12/10/2022   Inguinal hernia with gangrene, recurrent bilateral 06/17/2020   Cold sore 02/13/2016   Vitamin D deficiency 07/20/2015   Intellectual disability with epilepsy (HCC) 07/16/2015   Seizure disorder (HCC) 07/16/2015   Seasonal allergic rhinitis 07/16/2015   Dyslipidemia 07/16/2015   Hyponatremia 06/13/2015   Abnormal blood sugar 06/19/2008      Current Outpatient Medications:    bacitracin ointment, Apply topically as needed for wound care., Disp: 120 g, Rfl: 0   carbamazepine (TEGRETOL) 200 MG tablet, TAKE 2 TABLETS BY MOUTH IN THE MORNING, THEN TAKE  1 TABLET AT LUNCH AND TAKE 1 AT NIGHT, Disp: 360 tablet, Rfl:  1   chlorpheniramine (CHLOR-TRIMETON) 4 MG tablet, Take 4 mg by mouth 2 (two) times daily as needed for allergies., Disp: , Rfl:    fiber supplement, BANATROL TF, liquid, Take 60 mLs by mouth in the morning and at bedtime for 14 days., Disp: 1680 mL, Rfl: 0   loperamide (IMODIUM) 2 MG capsule, Take 2 capsules (4 mg total) by mouth 2 (two) times daily., Disp: 120 capsule, Rfl: 0   Multiple Vitamin (MULTIVITAMIN WITH MINERALS) TABS tablet, Take 1 tablet by mouth daily., Disp: , Rfl:    Nutritional Supplements (FEEDING SUPPLEMENT, KATE FARMS STANDARD 1.4,) LIQD liquid, Take 325 mLs by mouth 2 (two) times daily between meals for 14 days., Disp: 9100 mL, Rfl: 0   ondansetron (ZOFRAN-ODT) 4 MG disintegrating tablet, Take 1 tablet (4 mg total) by mouth every 6 (six) hours as needed for nausea., Disp: 20 tablet, Rfl: 0   PHENobarbital (LUMINAL) 64.8 MG tablet, Take 1 tablet (64.8 mg total) by mouth 2 (two) times daily., Disp: 180 tablet, Rfl: 1   PREVIDENT 5000 SENSITIVE 1.1-5 % GEL, Place onto teeth 2 (two) times daily., Disp: , Rfl:    rosuvastatin (CRESTOR) 10 MG tablet, Take 1 tablet (10 mg total) by mouth daily., Disp: 90 tablet, Rfl: 1   valACYclovir (VALTREX) 1000 MG tablet, TAKE 1 TABLET BY MOUTH TWICE DAILY AS NEEDED FOR  OUTBREAK, Disp: 30 tablet, Rfl: 0   No Known Allergies   Social History   Tobacco Use   Smoking status: Never    Passive exposure: Never  Smokeless tobacco: Never  Vaping Use   Vaping Use: Never used  Substance Use Topics   Alcohol use: No    Alcohol/week: 0.0 standard drinks of alcohol   Drug use: No      Chart Review Today: ***  Review of Systems     Objective:   There were no vitals filed for this visit.  There is no height or weight on file to calculate BMI.  Physical Exam   Results for orders placed or performed during the hospital encounter of 02/19/23  Resp panel by RT-PCR (RSV, Flu A&B, Covid) Anterior Nasal Swab   Specimen: Anterior Nasal  Swab  Result Value Ref Range   SARS Coronavirus 2 by RT PCR NEGATIVE NEGATIVE   Influenza A by PCR NEGATIVE NEGATIVE   Influenza B by PCR NEGATIVE NEGATIVE   Resp Syncytial Virus by PCR NEGATIVE NEGATIVE  Blood Culture (routine x 2)   Specimen: BLOOD  Result Value Ref Range   Specimen Description BLOOD RIGHT ANTECUBITAL    Special Requests      BOTTLES DRAWN AEROBIC AND ANAEROBIC Blood Culture adequate volume   Culture      NO GROWTH 5 DAYS Performed at [facility], [address] [place], [place] [address]    Report Status 02/24/2023 FINAL   Blood Culture (routine x 2)   Specimen: BLOOD  Result Value Ref Range   Specimen Description BLOOD BLOOD RIGHT FOREARM    Special Requests      BOTTLES DRAWN AEROBIC AND ANAEROBIC Blood Culture adequate volume   Culture      NO GROWTH 5 DAYS Performed at [facility], [address], [place], [place] [address]    Report Status 02/24/2023 FINAL   Urine Culture (for pregnant, neutropenic or urologic patients or patients with an indwelling urinary catheter)   Specimen: Urine, Clean Catch  Result Value Ref Range   Specimen Description      URINE, CLEAN CATCH Performed at [facility], [address], [place], [place] [address]    Special Requests      NONE Performed at [facility], [address] [place], [place] [address]    Culture      NO GROWTH Performed at [facility], [address] [place], [place] [address]    Report Status 02/20/2023 FINAL   Gastrointestinal Panel by PCR , Stool   Specimen: Stool  Result Value Ref Range   Campylobacter species NOT DETECTED NOT DETECTED   Plesimonas shigelloides NOT DETECTED NOT DETECTED   Salmonella species NOT DETECTED NOT DETECTED   Yersinia enterocolitica NOT DETECTED NOT DETECTED   Vibrio species NOT DETECTED NOT DETECTED   Vibrio cholerae NOT DETECTED NOT DETECTED   Enteroaggregative E coli (EAEC) NOT DETECTED NOT DETECTED    Enteropathogenic E coli (EPEC) NOT DETECTED NOT DETECTED   Enterotoxigenic E coli (ETEC) NOT DETECTED NOT DETECTED   Shiga like toxin producing E coli (STEC) NOT DETECTED NOT DETECTED   Shigella/Enteroinvasive E coli (EIEC) NOT DETECTED NOT DETECTED   Cryptosporidium NOT DETECTED NOT DETECTED   Cyclospora cayetanensis NOT DETECTED NOT DETECTED   Entamoeba histolytica NOT DETECTED NOT DETECTED   Giardia lamblia NOT DETECTED NOT DETECTED   Adenovirus F40/41 NOT DETECTED NOT DETECTED   Astrovirus NOT DETECTED NOT DETECTED   Norovirus GI/GII NOT DETECTED NOT DETECTED   Rotavirus A NOT DETECTED NOT DETECTED   Sapovirus (I, II, IV, and V) NOT DETECTED NOT DETECTED  MRSA Next Gen  by PCR, Nasal   Specimen: Nasal Mucosa; Nasal Swab  Result Value Ref Range   MRSA by PCR Next Gen NOT DETECTED NOT DETECTED  C Difficile Quick Screen w PCR reflex   Specimen: STOOL  Result Value Ref Range   C Diff antigen NEGATIVE NEGATIVE   C Diff toxin NEGATIVE NEGATIVE   C Diff interpretation No C. difficile detected.   Stool culture   Specimen: Stool  Result Value Ref Range   Salmonella/Shigella Screen Final report    Campylobacter Culture Final report    E coli, Shiga toxin Assay Negative Negative  OVA + PARASITE EXAM   Specimen: Stool  Result Value Ref Range   OVA + PARASITE EXAM Final report    Source of Sample STOOL   STOOL CULTURE REFLEX - RSASHR  Result Value Ref Range   Stool Culture result 1 (RSASHR) Comment   STOOL CULTURE Reflex - CMPCXR  Result Value Ref Range   Stool Culture result 1 (CMPCXR) Comment   Lactic acid, plasma  Result Value Ref Range   Lactic Acid, Venous 4.6 (HH) 0.5 - 1.9 mmol/L  Lactic acid, plasma  Result Value Ref Range   Lactic Acid, Venous 2.9 (HH) 0.5 - 1.9 mmol/L  Comprehensive metabolic panel  Result Value Ref Range   Sodium 125 (L) 135 - 145 mmol/L   Potassium 3.1 (L) 3.5 - 5.1 mmol/L   Chloride 86 (L) 98 - 111 mmol/L   CO2 24 22 - 32 mmol/L   Glucose, Bld  206 (H) 70 - 99 mg/dL   BUN 36 (H) 6 - 20 mg/dL   Creatinine, Ser 1.61 (H) 0.61 - 1.24 mg/dL   Calcium 8.2 (L) 8.9 - 10.3 mg/dL   Total Protein 6.3 (L) 6.5 - 8.1 g/dL   Albumin 3.1 (L) 3.5 - 5.0 g/dL   AST 33 15 - 41 U/L   ALT 21 0 - 44 U/L   Alkaline Phosphatase 110 38 - 126 U/L   Total Bilirubin 1.0 0.3 - 1.2 mg/dL   GFR, Estimated 58 (L) >60 mL/min   Anion gap 15 5 - 15  CBC with Differential  Result Value Ref Range   WBC 8.4 4.0 - 10.5 K/uL   RBC 4.72 4.22 - 5.81 MIL/uL   Hemoglobin 13.8 13.0 - 17.0 g/dL   HCT 09.6 04.5 - 40.9 %   MCV 83.9 80.0 - 100.0 fL   MCH 29.2 26.0 - 34.0 pg   MCHC 34.8 30.0 - 36.0 g/dL   RDW 81.1 91.4 - 78.2 %   Platelets 381 150 - 400 K/uL   nRBC 0.0 0.0 - 0.2 %   Neutrophils Relative % 82 %   Neutro Abs 6.8 1.7 - 7.7 K/uL   Lymphocytes Relative 16 %   Lymphs Abs 1.3 0.7 - 4.0 K/uL   Monocytes Relative 2 %   Monocytes Absolute 0.1 0.1 - 1.0 K/uL   Eosinophils Relative 0 %   Eosinophils Absolute 0.0 0.0 - 0.5 K/uL   Basophils Relative 0 %   Basophils Absolute 0.0 0.0 - 0.1 K/uL   Immature Granulocytes 0 %   Abs Immature Granulocytes 0.03 0.00 - 0.07 K/uL  Protime-INR  Result Value Ref Range   Prothrombin Time 17.5 (H) 11.4 - 15.2 seconds   INR 1.5 (H) 0.8 - 1.2  APTT  Result Value Ref Range   aPTT 23 (L) 24 - 36 seconds  Urinalysis, Complete w Microscopic -Urine, Clean Catch  Result Value Ref Range  Color, Urine YELLOW (A) YELLOW   APPearance CLOUDY (A) CLEAR   Specific Gravity, Urine 1.045 (H) 1.005 - 1.030   pH 5.0 5.0 - 8.0   Glucose, UA NEGATIVE NEGATIVE mg/dL   Hgb urine dipstick NEGATIVE NEGATIVE   Bilirubin Urine NEGATIVE NEGATIVE   Ketones, ur NEGATIVE NEGATIVE mg/dL   Protein, ur 132 (A) NEGATIVE mg/dL   Nitrite NEGATIVE NEGATIVE   Leukocytes,Ua NEGATIVE NEGATIVE   RBC / HPF 0-5 0 - 5 RBC/hpf   WBC, UA 0-5 0 - 5 WBC/hpf   Bacteria, UA NONE SEEN NONE SEEN   Squamous Epithelial / HPF 0-5 0 - 5 /HPF   Mucus PRESENT     Hyaline Casts, UA PRESENT    Amorphous Crystal PRESENT   Carbamazepine level, total  Result Value Ref Range   Carbamazepine Lvl 6.3 4.0 - 12.0 ug/mL  Phenobarbital level  Result Value Ref Range   Phenobarbital 22.6 15.0 - 40.0 ug/mL  Lipase, blood  Result Value Ref Range   Lipase 49 11 - 51 U/L  Procalcitonin  Result Value Ref Range   Procalcitonin 0.22 ng/mL  Magnesium  Result Value Ref Range   Magnesium 1.7 1.7 - 2.4 mg/dL  Legionella Pneumophila Serogp 1 Ur Ag  Result Value Ref Range   L. pneumophila Serogp 1 Ur Ag Negative Negative   Source of Sample URINE, RANDOM   Strep pneumoniae urinary antigen (not at [facility])  Result Value Ref Range   Strep Pneumo Urinary Antigen NEGATIVE NEGATIVE  Basic metabolic panel  Result Value Ref Range   Sodium 129 (L) 135 - 145 mmol/L   Potassium 3.1 (L) 3.5 - 5.1 mmol/L   Chloride 95 (L) 98 - 111 mmol/L   CO2 24 22 - 32 mmol/L   Glucose, Bld 121 (H) 70 - 99 mg/dL   BUN 22 (H) 6 - 20 mg/dL   Creatinine, Ser 4.40 0.61 - 1.24 mg/dL   Calcium 7.8 (L) 8.9 - 10.3 mg/dL   GFR, Estimated >10 >27 mL/min   Anion gap 10 5 - 15  CBC  Result Value Ref Range   WBC 22.5 (H) 4.0 - 10.5 K/uL   RBC 3.38 (L) 4.22 - 5.81 MIL/uL   Hemoglobin 10.0 (L) 13.0 - 17.0 g/dL   HCT 25.3 (L) 66.4 - 40.3 %   MCV 83.4 80.0 - 100.0 fL   MCH 29.6 26.0 - 34.0 pg   MCHC 35.5 30.0 - 36.0 g/dL   RDW 47.4 25.9 - 56.3 %   Platelets 191 150 - 400 K/uL   nRBC 0.0 0.0 - 0.2 %  Magnesium  Result Value Ref Range   Magnesium 2.0 1.7 - 2.4 mg/dL  Phosphorus  Result Value Ref Range   Phosphorus 2.8 2.5 - 4.6 mg/dL  Basic metabolic panel  Result Value Ref Range   Sodium 133 (L) 135 - 145 mmol/L   Potassium 3.1 (L) 3.5 - 5.1 mmol/L   Chloride 97 (L) 98 - 111 mmol/L   CO2 28 22 - 32 mmol/L   Glucose, Bld 133 (H) 70 - 99 mg/dL   BUN 18 6 - 20 mg/dL   Creatinine, Ser 8.75 0.61 - 1.24 mg/dL   Calcium 7.8 (L) 8.9 - 10.3 mg/dL   GFR, Estimated >64 >33 mL/min   Anion gap 8 5  - 15  CBC  Result Value Ref Range   WBC 16.4 (H) 4.0 - 10.5 K/uL   RBC 3.48 (L) 4.22 - 5.81 MIL/uL   Hemoglobin 10.1 (  L) 13.0 - 17.0 g/dL   HCT 96.0 (L) 45.4 - 09.8 %   MCV 85.3 80.0 - 100.0 fL   MCH 29.0 26.0 - 34.0 pg   MCHC 34.0 30.0 - 36.0 g/dL   RDW 11.9 14.7 - 82.9 %   Platelets 195 150 - 400 K/uL   nRBC 0.0 0.0 - 0.2 %  Magnesium  Result Value Ref Range   Magnesium 2.1 1.7 - 2.4 mg/dL  Glucose, capillary  Result Value Ref Range   Glucose-Capillary 153 (H) 70 - 99 mg/dL  Comprehensive metabolic panel  Result Value Ref Range   Sodium 131 (L) 135 - 145 mmol/L   Potassium 3.2 (L) 3.5 - 5.1 mmol/L   Chloride 99 98 - 111 mmol/L   CO2 26 22 - 32 mmol/L   Glucose, Bld 174 (H) 70 - 99 mg/dL   BUN 17 6 - 20 mg/dL   Creatinine, Ser 5.62 0.61 - 1.24 mg/dL   Calcium 7.8 (L) 8.9 - 10.3 mg/dL   Total Protein 5.5 (L) 6.5 - 8.1 g/dL   Albumin 2.4 (L) 3.5 - 5.0 g/dL   AST 37 15 - 41 U/L   ALT 28 0 - 44 U/L   Alkaline Phosphatase 89 38 - 126 U/L   Total Bilirubin 0.5 0.3 - 1.2 mg/dL   GFR, Estimated >13 >08 mL/min   Anion gap 6 5 - 15  Magnesium  Result Value Ref Range   Magnesium 1.8 1.7 - 2.4 mg/dL  Phosphorus  Result Value Ref Range   Phosphorus 1.4 (L) 2.5 - 4.6 mg/dL  Triglycerides  Result Value Ref Range   Triglycerides 203 (H) <150 mg/dL  CBC  Result Value Ref Range   WBC 11.5 (H) 4.0 - 10.5 K/uL   RBC 3.33 (L) 4.22 - 5.81 MIL/uL   Hemoglobin 9.8 (L) 13.0 - 17.0 g/dL   HCT 65.7 (L) 84.6 - 96.2 %   MCV 85.3 80.0 - 100.0 fL   MCH 29.4 26.0 - 34.0 pg   MCHC 34.5 30.0 - 36.0 g/dL   RDW 95.2 84.1 - 32.4 %   Platelets 188 150 - 400 K/uL   nRBC 0.0 0.0 - 0.2 %  Glucose, capillary  Result Value Ref Range   Glucose-Capillary 120 (H) 70 - 99 mg/dL  Glucose, capillary  Result Value Ref Range   Glucose-Capillary 157 (H) 70 - 99 mg/dL  Glucose, capillary  Result Value Ref Range   Glucose-Capillary 164 (H) 70 - 99 mg/dL   Comment 1 Notify RN    Comment 2 Document in  Chart   Glucose, capillary  Result Value Ref Range   Glucose-Capillary 142 (H) 70 - 99 mg/dL  Basic metabolic panel  Result Value Ref Range   Sodium 134 (L) 135 - 145 mmol/L   Potassium 3.7 3.5 - 5.1 mmol/L   Chloride 104 98 - 111 mmol/L   CO2 23 22 - 32 mmol/L   Glucose, Bld 148 (H) 70 - 99 mg/dL   BUN 19 6 - 20 mg/dL   Creatinine, Ser 4.01 0.61 - 1.24 mg/dL   Calcium 8.3 (L) 8.9 - 10.3 mg/dL   GFR, Estimated >02 >72 mL/min   Anion gap 7 5 - 15  Phosphorus  Result Value Ref Range   Phosphorus 2.5 2.5 - 4.6 mg/dL  Phenobarbital level  Result Value Ref Range   Phenobarbital 15.4 15.0 - 40.0 ug/mL  Glucose, capillary  Result Value Ref Range   Glucose-Capillary 156 (H) 70 -  99 mg/dL  Glucose, capillary  Result Value Ref Range   Glucose-Capillary 149 (H) 70 - 99 mg/dL  Glucose, capillary  Result Value Ref Range   Glucose-Capillary 130 (H) 70 - 99 mg/dL   Comment 1 Notify RN    Comment 2 Document in Chart   O&P Result  Result Value Ref Range   O&P result 1 Comment   Comprehensive metabolic panel  Result Value Ref Range   Sodium 136 135 - 145 mmol/L   Potassium 3.8 3.5 - 5.1 mmol/L   Chloride 106 98 - 111 mmol/L   CO2 23 22 - 32 mmol/L   Glucose, Bld 145 (H) 70 - 99 mg/dL   BUN 21 (H) 6 - 20 mg/dL   Creatinine, Ser 0.98 0.61 - 1.24 mg/dL   Calcium 8.0 (L) 8.9 - 10.3 mg/dL   Total Protein 5.7 (L) 6.5 - 8.1 g/dL   Albumin 2.4 (L) 3.5 - 5.0 g/dL   AST 30 15 - 41 U/L   ALT 25 0 - 44 U/L   Alkaline Phosphatase 93 38 - 126 U/L   Total Bilirubin 0.3 0.3 - 1.2 mg/dL   GFR, Estimated >11 >91 mL/min   Anion gap 7 5 - 15  Magnesium  Result Value Ref Range   Magnesium 1.8 1.7 - 2.4 mg/dL  Phosphorus  Result Value Ref Range   Phosphorus 2.6 2.5 - 4.6 mg/dL  Triglycerides  Result Value Ref Range   Triglycerides 217 (H) <150 mg/dL  Valproic acid level  Result Value Ref Range   Valproic Acid Lvl 25 (L) 50.0 - 100.0 ug/mL  Phenobarbital level  Result Value Ref Range    Phenobarbital 14.1 (L) 15.0 - 40.0 ug/mL  Glucose, capillary  Result Value Ref Range   Glucose-Capillary 139 (H) 70 - 99 mg/dL   Comment 1 Notify RN    Comment 2 Document in Chart   CBC with Differential/Platelet  Result Value Ref Range   WBC 14.4 (H) 4.0 - 10.5 K/uL   RBC 3.11 (L) 4.22 - 5.81 MIL/uL   Hemoglobin 9.1 (L) 13.0 - 17.0 g/dL   HCT 47.8 (L) 29.5 - 62.1 %   MCV 87.1 80.0 - 100.0 fL   MCH 29.3 26.0 - 34.0 pg   MCHC 33.6 30.0 - 36.0 g/dL   RDW 30.8 65.7 - 84.6 %   Platelets 202 150 - 400 K/uL   nRBC 0.0 0.0 - 0.2 %   Neutrophils Relative % 72 %   Neutro Abs 10.5 (H) 1.7 - 7.7 K/uL   Lymphocytes Relative 9 %   Lymphs Abs 1.2 0.7 - 4.0 K/uL   Monocytes Relative 9 %   Monocytes Absolute 1.4 (H) 0.1 - 1.0 K/uL   Eosinophils Relative 3 %   Eosinophils Absolute 0.4 0.0 - 0.5 K/uL   Basophils Relative 1 %   Basophils Absolute 0.1 0.0 - 0.1 K/uL   WBC Morphology Mild Left Shift (1-5% metas, occ myelo)    RBC Morphology MORPHOLOGY UNREMARKABLE    Smear Review Normal platelet morphology    Immature Granulocytes 6 %   Abs Immature Granulocytes 0.85 (H) 0.00 - 0.07 K/uL  Glucose, capillary  Result Value Ref Range   Glucose-Capillary 147 (H) 70 - 99 mg/dL  Glucose, capillary  Result Value Ref Range   Glucose-Capillary 111 (H) 70 - 99 mg/dL  Glucose, capillary  Result Value Ref Range   Glucose-Capillary 145 (H) 70 - 99 mg/dL   Comment 1 Notify RN  Comment 2 Document in Chart   CBC with Differential/Platelet  Result Value Ref Range   WBC 18.8 (H) 4.0 - 10.5 K/uL   RBC 3.24 (L) 4.22 - 5.81 MIL/uL   Hemoglobin 9.5 (L) 13.0 - 17.0 g/dL   HCT 74.2 (L) 59.5 - 63.8 %   MCV 86.1 80.0 - 100.0 fL   MCH 29.3 26.0 - 34.0 pg   MCHC 34.1 30.0 - 36.0 g/dL   RDW 75.6 43.3 - 29.5 %   Platelets 255 150 - 400 K/uL   nRBC 0.0 0.0 - 0.2 %   Neutrophils Relative % 71 %   Neutro Abs 13.6 (H) 1.7 - 7.7 K/uL   Lymphocytes Relative 9 %   Lymphs Abs 1.7 0.7 - 4.0 K/uL   Monocytes  Relative 8 %   Monocytes Absolute 1.6 (H) 0.1 - 1.0 K/uL   Eosinophils Relative 3 %   Eosinophils Absolute 0.5 0.0 - 0.5 K/uL   Basophils Relative 1 %   Basophils Absolute 0.1 0.0 - 0.1 K/uL   WBC Morphology Mild Left Shift (1-5% metas, occ myelo)    RBC Morphology MORPHOLOGY UNREMARKABLE    Smear Review Normal platelet morphology    Immature Granulocytes 8 %   Abs Immature Granulocytes 1.43 (H) 0.00 - 0.07 K/uL  Basic metabolic panel  Result Value Ref Range   Sodium 131 (L) 135 - 145 mmol/L   Potassium 3.7 3.5 - 5.1 mmol/L   Chloride 103 98 - 111 mmol/L   CO2 22 22 - 32 mmol/L   Glucose, Bld 131 (H) 70 - 99 mg/dL   BUN 16 6 - 20 mg/dL   Creatinine, Ser 1.88 (L) 0.61 - 1.24 mg/dL   Calcium 8.1 (L) 8.9 - 10.3 mg/dL   GFR, Estimated >41 >66 mL/min   Anion gap 6 5 - 15  Magnesium  Result Value Ref Range   Magnesium 1.8 1.7 - 2.4 mg/dL  Valproic acid level  Result Value Ref Range   Valproic Acid Lvl 32 (L) 50.0 - 100.0 ug/mL  Glucose, capillary  Result Value Ref Range   Glucose-Capillary 138 (H) 70 - 99 mg/dL   Comment 1 Notify RN    Comment 2 Document in Chart   Glucose, capillary  Result Value Ref Range   Glucose-Capillary 142 (H) 70 - 99 mg/dL  Glucose, capillary  Result Value Ref Range   Glucose-Capillary 135 (H) 70 - 99 mg/dL  Glucose, capillary  Result Value Ref Range   Glucose-Capillary 105 (H) 70 - 99 mg/dL  CBC with Differential/Platelet  Result Value Ref Range   WBC 23.1 (H) 4.0 - 10.5 K/uL   RBC 3.39 (L) 4.22 - 5.81 MIL/uL   Hemoglobin 10.0 (L) 13.0 - 17.0 g/dL   HCT 06.3 (L) 01.6 - 01.0 %   MCV 86.4 80.0 - 100.0 fL   MCH 29.5 26.0 - 34.0 pg   MCHC 34.1 30.0 - 36.0 g/dL   RDW 93.2 35.5 - 73.2 %   Platelets 301 150 - 400 K/uL   nRBC 0.0 0.0 - 0.2 %   Neutrophils Relative % 77 %   Neutro Abs 17.9 (H) 1.7 - 7.7 K/uL   Lymphocytes Relative 8 %   Lymphs Abs 1.7 0.7 - 4.0 K/uL   Monocytes Relative 8 %   Monocytes Absolute 1.8 (H) 0.1 - 1.0 K/uL    Eosinophils Relative 2 %   Eosinophils Absolute 0.4 0.0 - 0.5 K/uL   Basophils Relative 0 %   Basophils  Absolute 0.1 0.0 - 0.1 K/uL   Immature Granulocytes 5 %   Abs Immature Granulocytes 1.25 (H) 0.00 - 0.07 K/uL  Basic metabolic panel  Result Value Ref Range   Sodium 132 (L) 135 - 145 mmol/L   Potassium 3.8 3.5 - 5.1 mmol/L   Chloride 102 98 - 111 mmol/L   CO2 22 22 - 32 mmol/L   Glucose, Bld 131 (H) 70 - 99 mg/dL   BUN 19 6 - 20 mg/dL   Creatinine, Ser 1.61 0.61 - 1.24 mg/dL   Calcium 8.6 (L) 8.9 - 10.3 mg/dL   GFR, Estimated >09 >60 mL/min   Anion gap 8 5 - 15  Magnesium  Result Value Ref Range   Magnesium 2.1 1.7 - 2.4 mg/dL  Glucose, capillary  Result Value Ref Range   Glucose-Capillary 109 (H) 70 - 99 mg/dL  CBC with Differential/Platelet  Result Value Ref Range   WBC 18.8 (H) 4.0 - 10.5 K/uL   RBC 3.52 (L) 4.22 - 5.81 MIL/uL   Hemoglobin 10.2 (L) 13.0 - 17.0 g/dL   HCT 45.4 (L) 09.8 - 11.9 %   MCV 86.6 80.0 - 100.0 fL   MCH 29.0 26.0 - 34.0 pg   MCHC 33.4 30.0 - 36.0 g/dL   RDW 14.7 82.9 - 56.2 %   Platelets 244 150 - 400 K/uL   nRBC 0.0 0.0 - 0.2 %   Neutrophils Relative % 76 %   Neutro Abs 14.4 (H) 1.7 - 7.7 K/uL   Lymphocytes Relative 9 %   Lymphs Abs 1.7 0.7 - 4.0 K/uL   Monocytes Relative 6 %   Monocytes Absolute 1.1 (H) 0.1 - 1.0 K/uL   Eosinophils Relative 2 %   Eosinophils Absolute 0.3 0.0 - 0.5 K/uL   Basophils Relative 1 %   Basophils Absolute 0.1 0.0 - 0.1 K/uL   WBC Morphology Mild Left Shift (1-5% metas, occ myelo)    RBC Morphology MORPHOLOGY UNREMARKABLE    Smear Review Normal platelet morphology    Immature Granulocytes 6 %   Abs Immature Granulocytes 1.18 (H) 0.00 - 0.07 K/uL  Comprehensive metabolic panel  Result Value Ref Range   Sodium 129 (L) 135 - 145 mmol/L   Potassium 4.2 3.5 - 5.1 mmol/L   Chloride 99 98 - 111 mmol/L   CO2 21 (L) 22 - 32 mmol/L   Glucose, Bld 131 (H) 70 - 99 mg/dL   BUN 21 (H) 6 - 20 mg/dL   Creatinine, Ser  1.30 0.61 - 1.24 mg/dL   Calcium 8.8 (L) 8.9 - 10.3 mg/dL   Total Protein 6.8 6.5 - 8.1 g/dL   Albumin 2.9 (L) 3.5 - 5.0 g/dL   AST 32 15 - 41 U/L   ALT 32 0 - 44 U/L   Alkaline Phosphatase 172 (H) 38 - 126 U/L   Total Bilirubin 0.5 0.3 - 1.2 mg/dL   GFR, Estimated >86 >57 mL/min   Anion gap 9 5 - 15  Magnesium  Result Value Ref Range   Magnesium 1.7 1.7 - 2.4 mg/dL  Phosphorus  Result Value Ref Range   Phosphorus 3.6 2.5 - 4.6 mg/dL  Triglycerides  Result Value Ref Range   Triglycerides 163 (H) <150 mg/dL  CBC with Differential/Platelet  Result Value Ref Range   WBC 31.4 (H) 4.0 - 10.5 K/uL   RBC 3.44 (L) 4.22 - 5.81 MIL/uL   Hemoglobin 10.1 (L) 13.0 - 17.0 g/dL   HCT 84.6 (L) 96.2 - 95.2 %  MCV 86.3 80.0 - 100.0 fL   MCH 29.4 26.0 - 34.0 pg   MCHC 34.0 30.0 - 36.0 g/dL   RDW 53.6 64.4 - 03.4 %   Platelets 257 150 - 400 K/uL   nRBC 0.0 0.0 - 0.2 %   Neutrophils Relative % 82 %   Neutro Abs 26.1 (H) 1.7 - 7.7 K/uL   Lymphocytes Relative 6 %   Lymphs Abs 1.9 0.7 - 4.0 K/uL   Monocytes Relative 6 %   Monocytes Absolute 1.8 (H) 0.1 - 1.0 K/uL   Eosinophils Relative 1 %   Eosinophils Absolute 0.3 0.0 - 0.5 K/uL   Basophils Relative 1 %   Basophils Absolute 0.2 (H) 0.0 - 0.1 K/uL   Immature Granulocytes 4 %   Abs Immature Granulocytes 1.10 (H) 0.00 - 0.07 K/uL  CBC with Differential/Platelet  Result Value Ref Range   WBC 22.9 (H) 4.0 - 10.5 K/uL   RBC 2.84 (L) 4.22 - 5.81 MIL/uL   Hemoglobin 8.3 (L) 13.0 - 17.0 g/dL   HCT 74.2 (L) 59.5 - 63.8 %   MCV 88.7 80.0 - 100.0 fL   MCH 29.2 26.0 - 34.0 pg   MCHC 32.9 30.0 - 36.0 g/dL   RDW 75.6 43.3 - 29.5 %   Platelets 167 150 - 400 K/uL   nRBC 0.0 0.0 - 0.2 %   Neutrophils Relative % 86 %   Neutro Abs 19.6 (H) 1.7 - 7.7 K/uL   Lymphocytes Relative 6 %   Lymphs Abs 1.4 0.7 - 4.0 K/uL   Monocytes Relative 6 %   Monocytes Absolute 1.4 (H) 0.1 - 1.0 K/uL   Eosinophils Relative 0 %   Eosinophils Absolute 0.1 0.0 - 0.5  K/uL   Basophils Relative 0 %   Basophils Absolute 0.1 0.0 - 0.1 K/uL   Immature Granulocytes 2 %   Abs Immature Granulocytes 0.40 (H) 0.00 - 0.07 K/uL  Lactate dehydrogenase  Result Value Ref Range   LDH 239 (H) 98 - 192 U/L  CK  Result Value Ref Range   Total CK 544 (H) 49 - 397 U/L  Comprehensive metabolic panel  Result Value Ref Range   Sodium 129 (L) 135 - 145 mmol/L   Potassium 3.9 3.5 - 5.1 mmol/L   Chloride 99 98 - 111 mmol/L   CO2 27 22 - 32 mmol/L   Glucose, Bld 139 (H) 70 - 99 mg/dL   BUN 22 (H) 6 - 20 mg/dL   Creatinine, Ser 1.88 0.61 - 1.24 mg/dL   Calcium 8.2 (L) 8.9 - 10.3 mg/dL   Total Protein 6.0 (L) 6.5 - 8.1 g/dL   Albumin 2.8 (L) 3.5 - 5.0 g/dL   AST 36 15 - 41 U/L   ALT 24 0 - 44 U/L   Alkaline Phosphatase 130 (H) 38 - 126 U/L   Total Bilirubin 0.6 0.3 - 1.2 mg/dL   GFR, Estimated >41 >66 mL/min   Anion gap 3 (L) 5 - 15  Magnesium  Result Value Ref Range   Magnesium 1.7 1.7 - 2.4 mg/dL  Phosphorus  Result Value Ref Range   Phosphorus 2.9 2.5 - 4.6 mg/dL  CBC with Differential/Platelet  Result Value Ref Range   WBC 15.6 (H) 4.0 - 10.5 K/uL   RBC 3.11 (L) 4.22 - 5.81 MIL/uL   Hemoglobin 9.1 (L) 13.0 - 17.0 g/dL   HCT 06.3 (L) 01.6 - 01.0 %   MCV 85.2 80.0 - 100.0 fL   MCH  29.3 26.0 - 34.0 pg   MCHC 34.3 30.0 - 36.0 g/dL   RDW 16.1 09.6 - 04.5 %   Platelets 224 150 - 400 K/uL   nRBC 0.0 0.0 - 0.2 %   Neutrophils Relative % 80 %   Neutro Abs 12.4 (H) 1.7 - 7.7 K/uL   Lymphocytes Relative 11 %   Lymphs Abs 1.7 0.7 - 4.0 K/uL   Monocytes Relative 7 %   Monocytes Absolute 1.0 0.1 - 1.0 K/uL   Eosinophils Relative 1 %   Eosinophils Absolute 0.2 0.0 - 0.5 K/uL   Basophils Relative 0 %   Basophils Absolute 0.1 0.0 - 0.1 K/uL   Immature Granulocytes 1 %   Abs Immature Granulocytes 0.20 (H) 0.00 - 0.07 K/uL  Comprehensive metabolic panel  Result Value Ref Range   Sodium 130 (L) 135 - 145 mmol/L   Potassium 3.9 3.5 - 5.1 mmol/L   Chloride 99 98 -  111 mmol/L   CO2 24 22 - 32 mmol/L   Glucose, Bld 98 70 - 99 mg/dL   BUN 19 6 - 20 mg/dL   Creatinine, Ser 4.09 0.61 - 1.24 mg/dL   Calcium 9.0 8.9 - 81.1 mg/dL   Total Protein 6.7 6.5 - 8.1 g/dL   Albumin 3.0 (L) 3.5 - 5.0 g/dL   AST 40 15 - 41 U/L   ALT 41 0 - 44 U/L   Alkaline Phosphatase 146 (H) 38 - 126 U/L   Total Bilirubin 0.5 0.3 - 1.2 mg/dL   GFR, Estimated >91 >47 mL/min   Anion gap 7 5 - 15  Triglycerides  Result Value Ref Range   Triglycerides 149 <150 mg/dL  Troponin I (High Sensitivity)  Result Value Ref Range   Troponin I (High Sensitivity) 48 (H) <18 ng/L       Assessment & Plan:   ***     Danelle Berry, PA-C 03/07/23 7:33 PM

## 2023-03-07 NOTE — Telephone Encounter (Signed)
Spoke with patient's mother and she said that his last bowel movement was yesterday and it was thin and only about 4 inches long, but no blood. She states that the blood on the toilet paper was bright red. None in the toilet or in his pants. He has not been wanting to eat and only eating some canned pasta and a breakfast bar. He has been drinking the Ensure when given to him. I instructed to reduce the Imodium to one tablet a day for a couple of days. He will follow up Wednesday as scheduled.

## 2023-03-07 NOTE — Telephone Encounter (Signed)
Spoke with the pt's mother Brextyn Linwood, pt felt like he needed to have a bowel movement but there was no stool. However when the pt was wiped, the tissue had blood on it, even after 2 wipes. Pt has not been eating and has lost 2lbs. Says she has been giving him the ensure and supplements but pt has had no desire to eat.   CB: [phone]

## 2023-03-08 ENCOUNTER — Encounter: Payer: Self-pay | Admitting: Family Medicine

## 2023-03-08 ENCOUNTER — Ambulatory Visit (INDEPENDENT_AMBULATORY_CARE_PROVIDER_SITE_OTHER): Payer: 59 | Admitting: Family Medicine

## 2023-03-08 VITALS — BP 116/80 | HR 91 | Temp 98.0°F | Resp 14 | Ht 70.0 in | Wt 149.3 lb

## 2023-03-08 DIAGNOSIS — D62 Acute posthemorrhagic anemia: Secondary | ICD-10-CM

## 2023-03-08 DIAGNOSIS — Z09 Encounter for follow-up examination after completed treatment for conditions other than malignant neoplasm: Secondary | ICD-10-CM

## 2023-03-08 DIAGNOSIS — E441 Mild protein-calorie malnutrition: Secondary | ICD-10-CM

## 2023-03-08 DIAGNOSIS — Z8619 Personal history of other infectious and parasitic diseases: Secondary | ICD-10-CM

## 2023-03-08 LAB — CBC WITH DIFFERENTIAL/PLATELET
Basophils Relative: 1.1 %
Hemoglobin: 10.3 g/dL — ABNORMAL LOW (ref 13.2–17.1)
MCHC: 32.9 g/dL (ref 32.0–36.0)
MPV: 11.7 fL (ref 7.5–12.5)
Monocytes Relative: 5 %
Neutro Abs: 6799 cells/uL (ref 1500–7800)
WBC: 9.2 10*3/uL (ref 3.8–10.8)

## 2023-03-08 NOTE — Progress Notes (Addendum)
Name: Alec Campbell   MRN: [id number]    DOB: 1970-11-11   Date:03/08/2023       Progress Note  Subjective  Chief Complaint  Hospital Follow-Up  HPI  Admitted: 02/19/23 Discharged: 03/03/23  Transitional of care called done 05/03/204   Medication reconciliation done   Patient had laparoscopic right colectomy for treatment of colon cancer and also cholecystectomy due to chronic cholecystitis by Dr. Everlene Farrier,  he went home on 04/12 but on 04/20 /2024 he went back to [facility] due to nausea , vomiting and diarrhea on April 13th, he was given IV fluids at Dr. Everlene Farrier on 04/18, fever started on 04/20 and had 2 syncopal episode and possible seizure. He was taken to [facility] by EMS and was found to be hypotensive, he was diagnosed with pneumonia, dehydration, AKI, septic shock  He went home on 05/02, his appetite is still poor, but no fever or chills. He vomited once this am after eating oatmeal, abdomen feels sore all over, doing well on Imodium - no diarrhea.  He has bowel movements about once a day . He has a mild cough but no SOB. He feels tired and lounging most of the day.   He has lost almost 30 lbs since surgery, he is malnourished   Patient Active Problem List   Diagnosis Date Noted   Hypophosphatemia 02/22/2023   AKI (acute kidney injury) (HCC) 02/21/2023   Hypokalemia 02/21/2023   Diarrhea 02/21/2023   Multifocal pneumonia 02/20/2023   Septic shock (HCC) 02/19/2023   SBO (small bowel obstruction) (HCC) 02/19/2023   S/P partial resection of colon 02/10/2023   Calculus of gallbladder with chronic cholecystitis without obstruction 02/10/2023   Colonic mass 01/13/2023   Gastritis, Helicobacter pylori 01/02/2023   Colon polyps 01/02/2023   Absolute anemia 12/30/2022   Adenomatous polyp of colon 12/30/2022   History of Helicobacter pylori infection 12/10/2022   History of anemia 12/10/2022   Inguinal hernia with gangrene, recurrent bilateral 06/17/2020   Cold sore 02/13/2016   Vitamin D deficiency  07/20/2015   Intellectual disability with epilepsy (HCC) 07/16/2015   Seizure disorder (HCC) 07/16/2015   Seasonal allergic rhinitis 07/16/2015   Dyslipidemia 07/16/2015   Hyponatremia 06/13/2015   Abnormal blood sugar 06/19/2008    Past Surgical History:  Procedure Laterality Date   ANKLE SURGERY Right    CHOLECYSTECTOMY N/A 02/10/2023   Procedure: LAPAROSCOPIC CHOLECYSTECTOMY;  Surgeon: Leafy Ro, MD;  Location: [facility] ORS;  Service: General;  Laterality: N/A;   CLEFT LIP REPAIR     COLONOSCOPY N/A 12/30/2022   Procedure: COLONOSCOPY;  Surgeon: Wyline Mood, MD;  Location: [facility] ENDOSCOPY;  Service: Gastroenterology;  Laterality: N/A;  SPECIAL NEEDS - MOTHER NEEDS TO ACCOMPANY HIM.   ESOPHAGOGASTRODUODENOSCOPY (EGD) WITH PROPOFOL N/A 12/30/2022   Procedure: ESOPHAGOGASTRODUODENOSCOPY (EGD) WITH PROPOFOL;  Surgeon: Wyline Mood, MD;  Location: [facility] ENDOSCOPY;  Service: Gastroenterology;  Laterality: N/A;   HERNIA REPAIR     Inguinal   LAPAROSCOPIC RIGHT COLECTOMY N/A 02/10/2023   Procedure: LAPAROSCOPIC RIGHT COLECTOMY, hand assisted, RNFA to assist;  Surgeon: Leafy Ro, MD;  Location: [facility] ORS;  Service: General;  Laterality: N/A;   PALATE SURGERY     TYMPANOSTOMY TUBE PLACEMENT      Family History  Problem Relation Age of Onset   Hypertension Mother    Thyroid disease Mother    CAD Father    Hypertension Father    Hyperlipidemia Father    Healthy Brother    Healthy Sister  Diabetes Brother    COPD Brother    Emphysema Brother    Alcohol abuse Brother    Hypertension Brother    Aneurysm Brother        brain    Social History   Tobacco Use   Smoking status: Never    Passive exposure: Never   Smokeless tobacco: Never  Substance Use Topics   Alcohol use: No    Alcohol/week: 0.0 standard drinks of alcohol     Current Outpatient Medications:    bacitracin ointment, Apply topically as needed for wound care., Disp: 120 g, Rfl: 0   carbamazepine (TEGRETOL)  200 MG tablet, TAKE 2 TABLETS BY MOUTH IN THE MORNING, THEN TAKE  1 TABLET AT LUNCH AND TAKE 1 AT NIGHT, Disp: 360 tablet, Rfl: 1   chlorpheniramine (CHLOR-TRIMETON) 4 MG tablet, Take 4 mg by mouth 2 (two) times daily as needed for allergies., Disp: , Rfl:    fiber supplement, BANATROL TF, liquid, Take 60 mLs by mouth in the morning and at bedtime for 14 days., Disp: 1680 mL, Rfl: 0   loperamide (IMODIUM) 2 MG capsule, Take 2 capsules (4 mg total) by mouth 2 (two) times daily., Disp: 120 capsule, Rfl: 0   Multiple Vitamin (MULTIVITAMIN WITH MINERALS) TABS tablet, Take 1 tablet by mouth daily., Disp: , Rfl:    Nutritional Supplements (FEEDING SUPPLEMENT, KATE FARMS STANDARD 1.4,) LIQD liquid, Take 325 mLs by mouth 2 (two) times daily between meals for 14 days., Disp: 9100 mL, Rfl: 0   ondansetron (ZOFRAN-ODT) 4 MG disintegrating tablet, Take 1 tablet (4 mg total) by mouth every 6 (six) hours as needed for nausea., Disp: 20 tablet, Rfl: 0   PHENobarbital (LUMINAL) 64.8 MG tablet, Take 1 tablet (64.8 mg total) by mouth 2 (two) times daily., Disp: 180 tablet, Rfl: 1   PREVIDENT 5000 SENSITIVE 1.1-5 % GEL, Place onto teeth 2 (two) times daily., Disp: , Rfl:    rosuvastatin (CRESTOR) 10 MG tablet, Take 1 tablet (10 mg total) by mouth daily., Disp: 90 tablet, Rfl: 1   valACYclovir (VALTREX) 1000 MG tablet, TAKE 1 TABLET BY MOUTH TWICE DAILY AS NEEDED FOR  OUTBREAK, Disp: 30 tablet, Rfl: 0  No Known Allergies  I personally reviewed active problem list, medication list, allergies, family history, social history, health maintenance with the patient/caregiver today.   ROS  Ten systems reviewed and is negative except as mentioned in HPI   Objective  Vitals:   03/08/23 1029  BP: 116/80  Pulse: 91  Resp: 14  Temp: 98 F (36.7 C)  TempSrc: Oral  SpO2: 94%  Weight: 149 lb 4.8 oz (67.7 kg)  Height: 5\' 10"  (1.778 m)    Body mass index is 21.42 kg/m.  Physical Exam  Constitutional: Patient  appears well-developed  No distress.  HEENT: head atraumatic, normocephalic, pupils equal and reactive to light, neck supple, cleft palate repair  Cardiovascular: Normal rate, regular rhythm and normal heart sounds.  No murmur heard. No BLE edema. Pulmonary/Chest: Effort normal and breath sounds normal. No respiratory distress. Abdominal: Soft. Decrease bowel sounds, scars healing well from recent surgery . No guarding or rebound tenderness  Psychiatric: cooperative   PHQ2/9:    01/13/2023   11:09 AM 12/10/2022   10:57 AM 06/09/2022    9:10 AM 04/15/2022   10:23 AM 12/10/2021    9:11 AM  Depression screen PHQ 2/9  Decreased Interest 0 0 0 0 0  Down, Depressed, Hopeless 0 0 0 0 0  PHQ - 2 Score 0 0 0 0 0  Altered sleeping  0   0  Tired, decreased energy  0   0  Change in appetite  0   0  Feeling bad or failure about yourself   0   0  Trouble concentrating  0   0  Moving slowly or fidgety/restless  0   0  Suicidal thoughts  0   0  PHQ-9 Score  0   0    phq 9 is negative   Fall Risk:    03/08/2023   10:29 AM 01/31/2023    2:01 PM 01/24/2023    2:03 PM 12/10/2022   10:57 AM 06/09/2022    9:10 AM  Fall Risk   Falls in the past year? 0 0 0 0 0  Number falls in past yr:     0  Injury with Fall?     0  Risk for fall due to : No Fall Risks   No Fall Risks No Fall Risks  Follow up Falls prevention discussed   Falls prevention discussed;Education provided;Falls evaluation completed Falls prevention discussed      Functional Status Survey: Is the patient deaf or have difficulty hearing?: No Does the patient have difficulty seeing, even when wearing glasses/contacts?: No Does the patient have difficulty concentrating, remembering, or making decisions?: Yes Does the patient have difficulty walking or climbing stairs?: No Does the patient have difficulty dressing or bathing?: No Does the patient have difficulty doing errands alone such as visiting a doctor's office or shopping?:  Yes    Assessment & Plan  1. Hospital discharge follow-up  - COMPLETE METABOLIC PANEL WITH GFR - CBC with Differential/Platelet  2. History of sepsis   3. Anemia due to blood loss, acute  - CBC with Differential/Platelet  4. Mild protein-calorie malnutrition (HCC)  - COMPLETE METABOLIC PANEL WITH GFR

## 2023-03-08 NOTE — Patient Instructions (Signed)
High-Protein and High-Calorie Diet Eating high-protein and high-calorie foods can help you to gain weight, heal after an injury, and recover after an illness or surgery. The specific amount of daily protein and calories you need depends on: Your body weight. The reason this diet is recommended for you. Generally, a high-protein, high-calorie diet involves: Eating 250-500 extra calories each day. Making sure that you get enough of your daily calories from protein. Ask your health care provider how many of your calories should come from protein. Talk with a health care provider or a dietitian about how much protein and how many calories you need each day. Follow the diet as directed by your health care provider. What are tips for following this plan?  Reading food labels Check the nutrition facts label for calories, grams of fat and protein. Items with more than 4 grams of protein are high-protein foods. Preparing meals Add whole milk, half-and-half, or heavy cream to cereal, pudding, soup, or hot cocoa. Add whole milk to instant breakfast drinks. Add peanut butter to oatmeal or smoothies. Add powdered milk to baked goods, smoothies, or milkshakes. Add powdered milk, cream, or butter to mashed potatoes. Add cheese to cooked vegetables. Make whole-milk yogurt parfaits. Top them with granola, fruit, or nuts. Add cottage cheese to fruit. Add avocado, cheese, or both to sandwiches or salads. Add avocado to smoothies. Add meat, poultry, or seafood to rice, pasta, casseroles, salads, and soups. Use mayonnaise when making egg salad, chicken salad, or tuna salad. Use peanut butter as a dip for fruits and vegetables or as a topping for pretzels, celery, or crackers. Add beans to casseroles, dips, and spreads. Add pureed beans to sauces and soups. Replace calorie-free drinks with calorie-containing drinks, such as milk and fruit juice. Replace water with milk or heavy cream when making foods such as  oatmeal, pudding, or cocoa. Add oil or butter to cooked vegetables and grains. Add cream cheese to sandwiches or as a topping on crackers and bread. Make cream-based pastas and soups. General information Ask your health care provider if you should take a nutritional supplement. Try to eat six small meals each day instead of three large meals. A general goal is to eat every 2 to 3 hours. Eat a balanced diet. In each meal, include one food that is high in protein and one food with fat in it. Keep nutritious snacks available, such as nuts, trail mixes, dried fruit, and yogurt. If you have kidney disease or diabetes, talk with your health care provider about how much protein is safe for you. Too much protein may put extra stress on your kidneys. Drink your calories. Choose high-calorie drinks and have them after your meals. Consider setting a timer to remind you to eat. You will want to eat even if you do not feel very hungry. What high-protein foods should I eat?  Vegetables Soybeans. Peas. Grains Quinoa. Bulgur wheat. Buckwheat. Meats and other proteins Beef, pork, and poultry. Fish and seafood. Eggs. Tofu. Textured vegetable protein (TVP). Peanut butter. Nuts and seeds. Dried beans. Protein powders. Hummus. Dairy Whole milk. Whole-milk yogurt. Powdered milk. Cheese. Cottage Cheese. Eggnog. Beverages High-protein supplement drinks. Soy milk. Other foods Protein bars. The items listed above may not be a complete list of foods and beverages you can eat and drink. Contact a dietitian for more information. What high-calorie foods should I eat? Fruits Dried fruit. Fruit leather. Canned fruit in syrup. Fruit juice. Avocado. Vegetables Vegetables cooked in oil or butter. Fried potatoes. Grains   Pasta. Quick breads. Muffins. Pancakes. Ready-to-eat cereal. Meats and other proteins Peanut butter. Nuts and seeds. Dairy Heavy cream. Whipped cream. Cream cheese. Sour cream. Ice cream. Custard.  Pudding. Whole milk dairy products. Beverages Meal-replacement beverages. Nutrition shakes. Fruit juice. Seasonings and condiments Salad dressing. Mayonnaise. Alfredo sauce. Fruit preserves or jelly. Honey. Syrup. Sweets and desserts Cake. Cookies. Pie. Pastries. Candy bars. Chocolate. Fats and oils Butter or margarine. Oil. Gravy. Other foods Meal-replacement bars. The items listed above may not be a complete list of foods and beverages you can eat and drink. Contact a dietitian for more information. Summary A high-protein, high-calorie diet can help you gain weight or heal faster after an injury, illness, or surgery. To increase your protein and calories, add ingredients such as whole milk, peanut butter, cheese, beans, meat, or seafood to meal items. To get enough extra calories each day, include high-calorie foods and beverages at each meal. Adding a high-calorie drink or shake can be an easy way to help you get enough calories each day. Talk with your healthcare provider or dietitian about the best options for you. This information is not intended to replace advice given to you by your health care provider. Make sure you discuss any questions you have with your health care provider. Document Revised: 09/21/2020 Document Reviewed: 09/21/2020 Elsevier Patient Education  2023 [employer]  

## 2023-03-09 ENCOUNTER — Encounter: Payer: Self-pay | Admitting: Surgery

## 2023-03-09 ENCOUNTER — Ambulatory Visit (INDEPENDENT_AMBULATORY_CARE_PROVIDER_SITE_OTHER): Payer: 59 | Admitting: Surgery

## 2023-03-09 VITALS — BP 125/56 | HR 121 | Temp 98.0°F | Ht 70.0 in | Wt 148.6 lb

## 2023-03-09 DIAGNOSIS — C189 Malignant neoplasm of colon, unspecified: Secondary | ICD-10-CM

## 2023-03-09 DIAGNOSIS — Z08 Encounter for follow-up examination after completed treatment for malignant neoplasm: Secondary | ICD-10-CM

## 2023-03-09 DIAGNOSIS — Z09 Encounter for follow-up examination after completed treatment for conditions other than malignant neoplasm: Secondary | ICD-10-CM

## 2023-03-09 LAB — CBC WITH DIFFERENTIAL/PLATELET
Absolute Monocytes: 460 cells/uL (ref 200–950)
Basophils Absolute: 101 cells/uL (ref 0–200)
Eosinophils Absolute: 460 cells/uL (ref 15–500)
Eosinophils Relative: 5 %
HCT: 31.3 % — ABNORMAL LOW (ref 38.5–50.0)
Lymphs Abs: 1380 cells/uL (ref 850–3900)
MCH: 28.9 pg (ref 27.0–33.0)
MCV: 87.9 fL (ref 80.0–100.0)
Neutrophils Relative %: 73.9 %
Platelets: 376 10*3/uL (ref 140–400)
RBC: 3.56 10*6/uL — ABNORMAL LOW (ref 4.20–5.80)
RDW: 13 % (ref 11.0–15.0)
Total Lymphocyte: 15 %

## 2023-03-09 LAB — COMPLETE METABOLIC PANEL WITH GFR
AG Ratio: 1.3 (calc) (ref 1.0–2.5)
ALT: 24 U/L (ref 9–46)
AST: 20 U/L (ref 10–35)
Albumin: 3.8 g/dL (ref 3.6–5.1)
Alkaline phosphatase (APISO): 186 U/L — ABNORMAL HIGH (ref 35–144)
BUN: 18 mg/dL (ref 7–25)
CO2: 24 mmol/L (ref 20–32)
Calcium: 9.5 mg/dL (ref 8.6–10.3)
Chloride: 98 mmol/L (ref 98–110)
Creat: 0.86 mg/dL (ref 0.70–1.30)
Globulin: 3 g/dL (calc) (ref 1.9–3.7)
Glucose, Bld: 136 mg/dL — ABNORMAL HIGH (ref 65–99)
Potassium: 4.1 mmol/L (ref 3.5–5.3)
Sodium: 134 mmol/L — ABNORMAL LOW (ref 135–146)
Total Bilirubin: 0.3 mg/dL (ref 0.2–1.2)
Total Protein: 6.8 g/dL (ref 6.1–8.1)
eGFR: 104 mL/min/{1.73_m2} (ref >=60)

## 2023-03-09 NOTE — Patient Instructions (Addendum)
If you have any concerns or questions, please feel free to call our office.   Minimally Invasive Partial Colectomy, Adult, Care After The following information offers guidance on how to care for yourself after your procedure. Your health care provider may also give you more specific instructions. If you have problems or questions, contact your health care provider. What can I expect after the surgery? After the procedure, it is common to have: Pain, bruising, and swelling. Bloating. Weakness and tiredness (fatigue). Changes to your bowel movements, especially having bowel movements more often. Follow these instructions at home: Medicines Take over-the-counter and prescription medicines only as told by your health care provider. If you were prescribed an antibiotic medicine, take it as told by your health care provider. Do not stop using the antibiotic even if you start to feel better. Ask your health care provider if the medicine prescribed to you: Requires you to avoid driving or using machinery. Can cause constipation. You may need to take these actions to prevent or treat constipation: Drink enough fluids to keep your urine pale yellow. Take over-the-counter or prescription medicines. Limit foods that are high in fat and processed sugars, such as fried or sweet foods. Eating and drinking Follow instructions from your health care provider about what you may eat and drink. Do not drink alcohol if your health care provider tells you not to drink. Eat a low-fiber diet for the first 4 weeks after surgery or as told by your health care provider.. Most people on a low-fiber eating plan should eat less than 10 grams (g) of fiber a day. Follow recommendations from your health care provider or dietitian about how much fiber you should have each day. Always check food labels to know the fiber content of packaged foods. In general, a low-fiber food will have fewer than 2 g of fiber per serving. In  general, try to avoid whole grains, raw fruits and vegetables, dried fruit, tough cuts of meat, nuts, and seeds. Incision care  Follow instructions from your health care provider about how to take care of your incisions. Make sure you: Wash your hands with soap and water for at least 20 seconds before and after you change your bandage (dressing). If soap and water are not available, use hand sanitizer. Change your dressing as told by your health care provider. Leave stitches (sutures), skin glue, or adhesive strips in place. These skin closures may need to stay in place for 2 weeks or longer. If adhesive strip edges start to loosen and curl up, you may trim the loose edges. Do not remove adhesive strips completely unless your health care provider tells you to do that. Check your incision area every day for signs of infection. Check for: More redness, swelling, or pain. Fluid or blood. Warmth. Pus or a bad smell. Activity Rest as told by your health care provider. Avoid sitting for a long time without moving. Get up to take short walks every 1-2 hours. This is important to improve blood flow and breathing. Ask for help if you feel weak or unsteady. You may have to avoid lifting. Ask your health care provider how much you can safely lift. Return to your normal activities as told by your health care provider. Ask your health care provider what activities are safe for you. General instructions Do not use any products that contain nicotine or tobacco. These products include cigarettes, chewing tobacco, and vaping devices, such as e-cigarettes. If you need help quitting, ask your health care  provider. Do not take baths, swim, or use a hot tub until your health care provider approves. Ask your health care provider if you may take showers. You may only be allowed to take sponge baths. Wear compression stockings as told by your health care provider. These stockings help to prevent blood clots and reduce  swelling in your legs. Keep all follow-up visits. This is important to monitor healing and check for any complications. Contact a health care provider if: Medicine is not controlling your pain. You have chills or fever. You have any signs of infection in your incision areas. You have a persistent cough. You have nausea or vomiting. You develop a rash. You have not had a bowel movement in 3 days. Get help right away if: You have severe pain. Your incisions break open after sutures or staples have been removed. You are bleeding from your rectum or have blood in your stool. You have a warm, tender swelling in your leg. You have chest pain or trouble breathing. You have increased swelling in the abdomen. You feel light-headed or you faint. These symptoms may be an emergency. Get help right away. Call 911. Do not wait to see if the symptoms will go away. Do not drive yourself to the hospital. Summary After surgery, it is common to have some pain, bruising, swelling, bloating, tiredness, weakness, or changes to your bowel movements. Follow instructions from your health care provider about what to eat and drink. Return to your normal activities as told by your health care provider. Check your incision area every day for signs of infection. Get help right away if you have chest pain or trouble breathing. This information is not intended to replace advice given to you by your health care provider. Make sure you discuss any questions you have with your health care provider. Document Revised: 02/03/2022 Document Reviewed: 02/03/2022 Elsevier Patient Education  2023 [employer].

## 2023-03-09 NOTE — Progress Notes (Signed)
Jagger is a 4 weeks out from right colectomy.  He did have a complicated course due to pneumonia and ileus twelfths degree of rhabdomyolysis. He is otherwise doing okay.  Mom is concerned that he is not eating as much. Is not throwing up.  He endorses no pain.  Mom says that sometimes he has some lower back pain. recent lab work showed normal creatinine normalized white count  PE NAD Abd Soft, non tender patient healing well without infection.  No peritonitis no rebound  A/p  Doing well.  Resolved ileus resolved pneumonia.  I do think that he has been to take a little bit of time to get back to baseline.  I do not see evidence of complications related to his surgery.  I we will see him back in a few weeks

## 2023-03-10 ENCOUNTER — Encounter: Payer: Self-pay | Admitting: Internal Medicine

## 2023-03-10 ENCOUNTER — Other Ambulatory Visit: Payer: Self-pay

## 2023-03-10 ENCOUNTER — Inpatient Hospital Stay: Payer: 59 | Attending: Internal Medicine | Admitting: Internal Medicine

## 2023-03-10 ENCOUNTER — Inpatient Hospital Stay: Payer: 59

## 2023-03-10 VITALS — BP 109/87 | HR 110 | Temp 98.8°F | Resp 16 | Ht 70.0 in | Wt 149.8 lb

## 2023-03-10 DIAGNOSIS — K6389 Other specified diseases of intestine: Secondary | ICD-10-CM

## 2023-03-10 DIAGNOSIS — C189 Malignant neoplasm of colon, unspecified: Secondary | ICD-10-CM

## 2023-03-10 DIAGNOSIS — Z862 Personal history of diseases of the blood and blood-forming organs and certain disorders involving the immune mechanism: Secondary | ICD-10-CM

## 2023-03-10 DIAGNOSIS — D649 Anemia, unspecified: Secondary | ICD-10-CM

## 2023-03-10 DIAGNOSIS — D509 Iron deficiency anemia, unspecified: Secondary | ICD-10-CM | POA: Insufficient documentation

## 2023-03-10 DIAGNOSIS — F71 Moderate intellectual disabilities: Secondary | ICD-10-CM | POA: Diagnosis not present

## 2023-03-10 DIAGNOSIS — C182 Malignant neoplasm of ascending colon: Secondary | ICD-10-CM | POA: Insufficient documentation

## 2023-03-10 DIAGNOSIS — Z800 Family history of malignant neoplasm of digestive organs: Secondary | ICD-10-CM

## 2023-03-10 DIAGNOSIS — Z79899 Other long term (current) drug therapy: Secondary | ICD-10-CM | POA: Diagnosis not present

## 2023-03-10 DIAGNOSIS — Z85038 Personal history of other malignant neoplasm of large intestine: Secondary | ICD-10-CM | POA: Insufficient documentation

## 2023-03-10 HISTORY — DX: Family history of malignant neoplasm of digestive organs: Z80.0

## 2023-03-10 LAB — CBC WITH DIFFERENTIAL/PLATELET
Abs Immature Granulocytes: 0.07 10*3/uL (ref 0.00–0.07)
Basophils Absolute: 0.1 10*3/uL (ref 0.0–0.1)
Basophils Relative: 1 %
Eosinophils Absolute: 0.5 10*3/uL (ref 0.0–0.5)
Eosinophils Relative: 5 %
HCT: 32.4 % — ABNORMAL LOW (ref 39.0–52.0)
Hemoglobin: 10.7 g/dL — ABNORMAL LOW (ref 13.0–17.0)
Immature Granulocytes: 1 %
Lymphocytes Relative: 22 %
Lymphs Abs: 2.1 10*3/uL (ref 0.7–4.0)
MCH: 29.1 pg (ref 26.0–34.0)
MCHC: 33 g/dL (ref 30.0–36.0)
MCV: 88 fL (ref 80.0–100.0)
Monocytes Absolute: 0.5 10*3/uL (ref 0.1–1.0)
Monocytes Relative: 5 %
Neutro Abs: 6.2 10*3/uL (ref 1.7–7.7)
Neutrophils Relative %: 66 %
Platelets: 358 10*3/uL (ref 150–400)
RBC: 3.68 MIL/uL — ABNORMAL LOW (ref 4.22–5.81)
RDW: 13.8 % (ref 11.5–15.5)
WBC: 9.4 10*3/uL (ref 4.0–10.5)
nRBC: 0 % (ref 0.0–0.2)

## 2023-03-10 LAB — COMPREHENSIVE METABOLIC PANEL
ALT: 25 U/L (ref 0–44)
AST: 28 U/L (ref 15–41)
Albumin: 3.7 g/dL (ref 3.5–5.0)
Alkaline Phosphatase: 175 U/L — ABNORMAL HIGH (ref 38–126)
Anion gap: 11 (ref 5–15)
BUN: 16 mg/dL (ref 6–20)
CO2: 24 mmol/L (ref 22–32)
Calcium: 9.4 mg/dL (ref 8.9–10.3)
Chloride: 97 mmol/L — ABNORMAL LOW (ref 98–111)
Creatinine, Ser: 0.92 mg/dL (ref 0.61–1.24)
GFR, Estimated: >60 mL/min (ref >60)
Glucose, Bld: 114 mg/dL — ABNORMAL HIGH (ref 70–99)
Potassium: 4 mmol/L (ref 3.5–5.1)
Sodium: 132 mmol/L — ABNORMAL LOW (ref 135–145)
Total Bilirubin: 0.3 mg/dL (ref 0.3–1.2)
Total Protein: 7.8 g/dL (ref 6.5–8.1)

## 2023-03-10 LAB — FERRITIN: Ferritin: 122 ng/mL (ref 24–336)

## 2023-03-10 LAB — IRON AND TIBC
Iron: 57 ug/dL (ref 45–182)
Saturation Ratios: 17 % — ABNORMAL LOW (ref 17.9–39.5)
TIBC: 340 ug/dL (ref 250–450)
UIBC: 283 ug/dL

## 2023-03-10 NOTE — Progress Notes (Addendum)
Cancer Center CONSULT NOTE  Patient Care Team: Alec Cory, MD as PCP - General (Family Medicine) Alec Gutter, RN as Oncology Nurse Navigator Alec Barter, MD as Consulting Physician (Oncology)    CANCER STAGING   Cancer Staging  Colon adenocarcinoma [facility]) Staging form: Colon and Rectum, AJCC 8th Edition - Pathologic stage from 02/10/2023: Stage I (pT1, pN0, cM0) - Signed by Alec Barter, MD on 03/10/2023 Total positive nodes: 0 Histologic grading system: 4 grade system Histologic grade (G): G2   ASSESSMENT & PLAN:  Alec Campbell 52 y.o. male with pmh of intellectual disability, seizures was referred to medical oncology for ascending colon mass detected on colonoscopy.  # Ascending colon adenocarcinoma, stage I, pT1N0cM0 -Colonoscopy done by Dr. Tobi Campbell for IDA on 12/30/2022 showed 12 mm and 20 mm polyp in the cecum, a large polypoid lesion was found in the proximal ascending colon was lateral spreading, mucosa was biopsied, 7 mm polyp in the descending colon and 5 mm polyp in the rectum.  Endoscopic showed gastritis.  - Pathology showed tubular adenomas.  Ascending colon mass biopsy also showed high-grade dysplasia.  Correlation with clinical impression required as these findings may not be representative of the target lesion.  CT chest abdomen and pelvis (01/28/2023) negative for metastasis.  - Status post right hemicolectomy with cholecystectomy and omentectomy with Alec Campbell on 02/10/2023. Pathology from colon-moderately differentiated adenocarcinoma invasive into submucosa.  Incidental tubular adenoma x 1 and sessile serrated polyp x 3.  0/17 lymph nodes negative for malignancy.  G2, moderately differentiated, tumor size 1 cm, LVI negative, PNI negative. pT1N0. Gallbladder-chronic cholecystitis with cholelithiasis. Omentum-negative for malignancy. Postoperatively complicated by septic shock, multifocal pneumonia and partial bowel obstruction.  ICU stay requiring  pressors.  Treated with IV antibiotics.  Was briefly on TPN.  Discharged on 03/03/2023.  -Discussed with the patient's mother who is the primary caregiver about the pathology report showing right-sided colon adenocarcinoma with submucosal involvement.  No lymph nodes were involved.  Staged as stage I.  NCCN guidelines were reviewed. There is no role of adjuvant chemotherapy/immunotherapy. He needs observation. Recommended colonoscopy at 1 year after surgery.  If advanced adenoma, repeat, repeat in 1 year.  If no advanced adenoma repeat in 3 years and then every 5 years.  There is no recommendation for regular H&P with medical oncology and surveillance CT scans with stage I colon cancer.  Patient will continue to follow-up with Dr. Tobi Campbell.  MMR testing is pending.  If comes back positive, will make referral to genetics. No significant family hx of colon cancer or other cancer.      # Normocytic anemia -Improving.  Will add iron studies today.  # Seizures -Stable.  On phenobarbital and carbamazepine.  Orders Placed This Encounter  Procedures   Ferritin    Standing Status:   Future    Number of Occurrences:   1    Standing Expiration Date:   03/09/2024   Iron and TIBC([facility])    Standing Status:   Future    Number of Occurrences:   1    Standing Expiration Date:   03/09/2024   RTC as needed.  The total time spent in the appointment was 30 minutes encounter with patients including review of chart and various tests results, discussions about plan of care and coordination of care plan   All questions were answered. The patient knows to call the clinic with any problems, questions or concerns. No barriers to learning was detected.  Alec Campbell  Alec Bills, MD 5/9/20242:20 PM   HISTORY OF PRESENTING ILLNESS:  Alec Campbell 52 y.o. male with pmh of intellectual disability, seizures was referred to medical oncology for ascending colon mass detected on colonoscopy.  History obtained from mother  who is his caregiver.  Patient has been having issues with intermittent iron deficiency anemia. Colonoscopy done by Dr. Tobi Campbell on 12/30/2022 showed 12 mm and 20 mm polyp in the cecum, a large polypoid lesion lesion was found in the proximal ascending colon was lateral spreading, mucosa was biopsied, 7 mm polyp in the descending colon and 5 mm polyp in the rectum.  Endoscopic showed gastritis. Pathology showed tubular adenoma.  Ascending colon mass biopsy it also showed high-grade dysplasia.  Correlation with clinical impression required as these findings may not be representative of the target lesion.  CT chest abdomen and pelvis (01/28/2023) negative for metastasis.  Status post right hemicolectomy with cholecystectomy and omentectomy with Alec Campbell on 02/10/2023. Pathology from colon-moderately differentiated adenocarcinoma invasive into submucosa.  Incidental tubular adenoma x 1 and sessile serrated polyp x 3.  0/17 lymph nodes negative for malignancy.  G2, moderately differentiated, tumor size 1 cm, LVI negative, PNI negative. pT1N0. Gallbladder-chronic cholecystitis with cholelithiasis. Omentum-negative for malignancy. Postoperatively complicated by septic shock, multifocal pneumonia and partial bowel obstruction.  ICU stay requiring pressors.  Treated with IV antibiotics.  Was briefly on TPN.  Discharged on 03/03/2023.  Interval history- Patient was seen today as follow-up accompanied by mother who is the caregiver discuss pathology report and to assess need for any adjuvant therapy.  He was in the hospital with septic shock secondary to multifocal pneumonia and SBO.  Required pressors.  Was discharged last week.  He is slowly recovering from the surgery.  Has been eating small meals.  Was on Imodium for diarrhea.  Was asked by Alec Campbell to stop it and see how he does.  Had 1 episode of vomiting last week and his mother thinks he might have taken a bigger meal.  I have reviewed his chart and materials  related to his cancer extensively and collaborated history with the patient. Summary of oncologic history is as follows: Oncology History  Colon adenocarcinoma (HCC)  02/10/2023 Cancer Staging   Staging form: Colon and Rectum, AJCC 8th Edition - Pathologic stage from 02/10/2023: Stage I (pT1, pN0, cM0) - Signed by Alec Barter, MD on 03/10/2023 Total positive nodes: 0 Histologic grading system: 4 grade system Histologic grade (G): G2   03/10/2023 Initial Diagnosis   Colon adenocarcinoma [facility])     MEDICAL HISTORY:  Past Medical History:  Diagnosis Date   Allergy    Moderate mental retardation    Seizures (HCC)    Vitamin D deficiency     SURGICAL HISTORY: Past Surgical History:  Procedure Laterality Date   ANKLE SURGERY Right    CHOLECYSTECTOMY N/A 02/10/2023   Procedure: LAPAROSCOPIC CHOLECYSTECTOMY;  Surgeon: Leafy Ro, MD;  Location: [facility] ORS;  Service: General;  Laterality: N/A;   CLEFT LIP REPAIR     COLONOSCOPY N/A 12/30/2022   Procedure: COLONOSCOPY;  Surgeon: Wyline Mood, MD;  Location: [facility] ENDOSCOPY;  Service: Gastroenterology;  Laterality: N/A;  SPECIAL NEEDS - MOTHER NEEDS TO ACCOMPANY HIM.   ESOPHAGOGASTRODUODENOSCOPY (EGD) WITH PROPOFOL N/A 12/30/2022   Procedure: ESOPHAGOGASTRODUODENOSCOPY (EGD) WITH PROPOFOL;  Surgeon: Wyline Mood, MD;  Location: [facility] ENDOSCOPY;  Service: Gastroenterology;  Laterality: N/A;   HERNIA REPAIR     Inguinal   LAPAROSCOPIC RIGHT COLECTOMY N/A 02/10/2023   Procedure: LAPAROSCOPIC  RIGHT COLECTOMY, hand assisted, RNFA to assist;  Surgeon: Leafy Ro, MD;  Location: [facility] ORS;  Service: General;  Laterality: N/A;   PALATE SURGERY     TYMPANOSTOMY TUBE PLACEMENT      SOCIAL HISTORY: Social History   Socioeconomic History   Marital status: Single    Spouse name: Not on file   Number of children: 0   Years of education: Handicapped diploma   Highest education level: Not on file  Occupational History    Employer: DISABLED   Tobacco Use   Smoking status: Never    Passive exposure: Never   Smokeless tobacco: Never  Vaping Use   Vaping Use: Never used  Substance and Sexual Activity   Alcohol use: No    Alcohol/week: 0.0 standard drinks of alcohol   Drug use: No   Sexual activity: Never  Other Topics Concern   Not on file  Social History Narrative   Moderate mental retardation. Parents are the primary caregiver, he lives with them   Social Determinants of Health   Financial Resource Strain: Low Risk  (04/15/2022)   Overall Financial Resource Strain (CARDIA)    Difficulty of Paying Living Expenses: Not hard at all  Food Insecurity: No Food Insecurity (03/04/2023)   Hunger Vital Sign    Worried About Running Out of Food in the Last Year: Never true    Ran Out of Food in the Last Year: Never true  Transportation Needs: No Transportation Needs (03/04/2023)   PRAPARE - Administrator, Civil Service (Medical): No    Lack of Transportation (Non-Medical): No  Physical Activity: Insufficiently Active (04/15/2022)   Exercise Vital Sign    Days of Exercise per Week: 7 days    Minutes of Exercise per Session: 10 min  Stress: No Stress Concern Present (04/15/2022)   [employer] of Occupational Health - Occupational Stress Questionnaire    Feeling of Stress : Not at all  Social Connections: Socially Isolated (04/15/2022)   Social Connection and Isolation Panel [NHANES]    Frequency of Communication with Friends and Family: Twice a week    Frequency of Social Gatherings with Friends and Family: Once a week    Attends Religious Services: Never    Database administrator or Organizations: No    Attends Banker Meetings: Never    Marital Status: Never married  Intimate Partner Violence: Not At Risk (02/19/2023)   Humiliation, Afraid, Rape, and Kick questionnaire    Fear of Current or Ex-Partner: No    Emotionally Abused: No    Physically Abused: No    Sexually Abused: No    FAMILY  HISTORY: Family History  Problem Relation Age of Onset   Hypertension Mother    Thyroid disease Mother    CAD Father    Hypertension Father    Hyperlipidemia Father    Healthy Brother    Healthy Sister    Diabetes Brother    COPD Brother    Emphysema Brother    Alcohol abuse Brother    Hypertension Brother    Aneurysm Brother        brain    ALLERGIES:  has No Known Allergies.  MEDICATIONS:  Current Outpatient Medications  Medication Sig Dispense Refill   bacitracin ointment Apply topically as needed for wound care. 120 g 0   carbamazepine (TEGRETOL) 200 MG tablet TAKE 2 TABLETS BY MOUTH IN THE MORNING, THEN TAKE  1 TABLET AT LUNCH AND TAKE 1 AT  NIGHT 360 tablet 1   chlorpheniramine (CHLOR-TRIMETON) 4 MG tablet Take 4 mg by mouth 2 (two) times daily as needed for allergies.     fiber supplement, BANATROL TF, liquid Take 60 mLs by mouth in the morning and at bedtime for 14 days. 1680 mL 0   loperamide (IMODIUM) 2 MG capsule Take 2 capsules (4 mg total) by mouth 2 (two) times daily. 120 capsule 0   Multiple Vitamin (MULTIVITAMIN WITH MINERALS) TABS tablet Take 1 tablet by mouth daily.     Nutritional Supplements (FEEDING SUPPLEMENT, KATE FARMS STANDARD 1.4,) LIQD liquid Take 325 mLs by mouth 2 (two) times daily between meals for 14 days. 9100 mL 0   ondansetron (ZOFRAN-ODT) 4 MG disintegrating tablet Take 1 tablet (4 mg total) by mouth every 6 (six) hours as needed for nausea. 20 tablet 0   PHENobarbital (LUMINAL) 64.8 MG tablet Take 1 tablet (64.8 mg total) by mouth 2 (two) times daily. 180 tablet 1   PREVIDENT 5000 SENSITIVE 1.1-5 % GEL Place onto teeth 2 (two) times daily.     rosuvastatin (CRESTOR) 10 MG tablet Take 1 tablet (10 mg total) by mouth daily. 90 tablet 1   valACYclovir (VALTREX) 1000 MG tablet TAKE 1 TABLET BY MOUTH TWICE DAILY AS NEEDED FOR  OUTBREAK 30 tablet 0   No current facility-administered medications for this visit.    REVIEW OF SYSTEMS:   Pertinent  information mentioned in HPI All other systems were reviewed with the patient and are negative.  PHYSICAL EXAMINATION: ECOG PERFORMANCE STATUS: 0 - Asymptomatic  Vitals:   03/10/23 1344  BP: 109/87  Pulse: (!) 110  Resp: 16  Temp: 98.8 F (37.1 C)  SpO2: 98%    Filed Weights   03/10/23 1344  Weight: 149 lb 12.8 oz (67.9 kg)     GENERAL:alert, no distress and comfortable SKIN: skin color, texture, turgor are normal, no rashes or significant lesions EYES: normal, conjunctiva are pink and non-injected, sclera clear OROPHARYNX:no exudate, no erythema and lips, buccal mucosa, and tongue normal  NECK: supple, thyroid normal size, non-tender, without nodularity LYMPH:  no palpable lymphadenopathy in the cervical, axillary or inguinal LUNGS: clear to auscultation and percussion with normal breathing effort HEART: regular rate & rhythm and no murmurs and no lower extremity edema ABDOMEN:abdomen soft, non-tender and normal bowel sounds Musculoskeletal:no cyanosis of digits and no clubbing  PSYCH: alert & oriented x 3 with fluent speech NEURO: no focal motor/sensory deficits  LABORATORY DATA:  I have reviewed the data as listed Lab Results  Component Value Date   WBC 9.4 03/10/2023   HGB 10.7 (L) 03/10/2023   HCT 32.4 (L) 03/10/2023   MCV 88.0 03/10/2023   PLT 358 03/10/2023   Recent Labs    03/01/23 0437 03/03/23 0408 03/08/23 1113 03/10/23 1333  NA 129* 130* 134* 132*  K 3.9 3.9 4.1 4.0  CL 99 99 98 97*  CO2 27 24 24 24   GLUCOSE 139* 98 136* 114*  BUN 22* 19 18 16   CREATININE 0.73 0.68 0.86 0.92  CALCIUM 8.2* 9.0 9.5 9.4  GFRNONAA >60 >60  --  >60  PROT 6.0* 6.7 6.8 7.8  ALBUMIN 2.8* 3.0*  --  3.7  AST 36 40 20 28  ALT 24 41 24 25  ALKPHOS 130* 146*  --  175*  BILITOT 0.6 0.5 0.3 0.3    RADIOGRAPHIC STUDIES: I have personally reviewed the radiological images as listed and agreed with the findings in the report.  DG Abd Portable 1V  Result Date:  03/01/2023 CLINICAL DATA:  Postoperative ileus. EXAM: PORTABLE ABDOMEN - 1 VIEW COMPARISON:  February 24, 2023. FINDINGS: Status post cholecystectomy. Slightly decreased small bowel dilatation is noted suggesting improving ileus. No colonic dilatation is noted. IMPRESSION: Slightly decreased small bowel dilatation is noted suggesting improving ileus. Electronically Signed   By: Lupita Raider M.D.   On: 03/01/2023 08:12   CT ABDOMEN PELVIS W CONTRAST  Result Date: 02/28/2023 CLINICAL DATA:  Status post right hemicolectomy and cholecystectomy on 02/10/2023 for carcinoma of the ascending colon. Increased right lower quadrant abdominal pain and leukocytosis. EXAM: CT ABDOMEN AND PELVIS WITH CONTRAST TECHNIQUE: Multidetector CT imaging of the abdomen and pelvis was performed using the standard protocol following bolus administration of intravenous contrast. RADIATION DOSE REDUCTION: This exam was performed according to the departmental dose-optimization program which includes automated exposure control, adjustment of the mA and/or kV according to patient size and/or use of iterative reconstruction technique. CONTRAST:  OMNIPAQUE IOHEXOL 300 MG/ML  SOLN COMPARISON:  Multiple prior studies with the most recent dated 02/25/2023. FINDINGS: Lower chest: Slight improvement in posterior bibasilar pulmonary consolidative opacities likely representing atelectasis. Hepatobiliary: No focal liver abnormality is seen. Status post cholecystectomy. No biliary dilatation. No evidence to suggest bile leak. Pancreas: Unremarkable. No pancreatic ductal dilatation or surrounding inflammatory changes. Spleen: Normal in size without focal abnormality. Adrenals/Urinary Tract: Adrenal glands are unremarkable. Kidneys are normal, without renal calculi, focal lesion, or hydronephrosis. Bladder is unremarkable. Stomach/Bowel: Evaluation of bowel is hampered by respiratory motion artifact throughout much of the CT acquisition. Similar mild  diffuse ileus pattern involving the remaining colon. No significant small bowel dilatation. No gross significant free intraperitoneal air. At the level of the ileocolic anastomosis, no gross evidence of anastomotic leak with potential stable small foci or two of extraluminal air postoperatively. Vascular/Lymphatic: No significant vascular findings are present. No enlarged abdominal or pelvic lymph nodes. Reproductive: Prostate is unremarkable. Other: Stable bilateral inguinal hernias containing fat, right greater than left. No ascites or focal abscess. Musculoskeletal: No acute or significant osseous findings. IMPRESSION: 1. Evaluation of bowel is hampered by respiratory motion artifact throughout much of the CT acquisition. Similar mild diffuse ileus pattern involving the remaining colon. At the level of the ileocolic anastomosis, no gross evidence of anastomotic leak with potential stable small foci or two of extraluminal air postoperatively. 2. No focal postoperative abscess identified. 3. Slight improvement in posterior bibasilar pulmonary consolidative opacities likely representing atelectasis. 4. Stable bilateral inguinal hernias containing fat, right greater than left. Electronically Signed   By: Irish Lack M.D.   On: 02/28/2023 09:36   CT CHEST ABDOMEN PELVIS W CONTRAST  Result Date: 02/25/2023 CLINICAL DATA:  Status post right hemicolectomy with a ileus and rising leukocytosis. EXAM: CT CHEST, ABDOMEN, AND PELVIS WITH CONTRAST TECHNIQUE: Multidetector CT imaging of the chest, abdomen and pelvis was performed following the standard protocol during bolus administration of intravenous contrast. RADIATION DOSE REDUCTION: This exam was performed according to the departmental dose-optimization program which includes automated exposure control, adjustment of the mA and/or kV according to patient size and/or use of iterative reconstruction technique. CONTRAST:  OMNIPAQUE IOHEXOL 300 MG/ML  SOLN  COMPARISON:  CT 02/19/2023.  X-ray 02/24/2023 and older FINDINGS: CT CHEST FINDINGS Cardiovascular: The thoracic aorta has a normal course and caliber. There is some nodular eccentric noncalcified plaque along the distal descending thoracic aorta along the posteromedial wall on image 40 of series 3. Heart is nonenlarged.  No pericardial effusion. Right-sided PICC identified. Mediastinum/Nodes: No specific abnormal lymph node enlargement identified in the axillary regions, hilum or mediastinum. Mildly patulous esophagus. Preserved thyroid gland. Motion seen throughout the examination. Lungs/Pleura: No pneumothorax or effusion. There are more confluence consolidative opacities along both lower lobes to the prior CT scan. Developing infiltrate is possible versus components atelectasis. Recommend continued follow-up. No pneumothorax or effusion. Calcified right lung nodule identified on series 5, image 77 consistent with old granulomatous disease. Musculoskeletal: Mild degenerative changes seen along the spine. 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 CT ABDOMEN PELVIS FINDINGS Hepatobiliary: No focal liver abnormality is seen. Status post cholecystectomy. No biliary dilatation. Pancreas: Unremarkable. No pancreatic ductal dilatation or surrounding inflammatory changes. Spleen: There is a subtle low-density area identified along the inferior aspect of the spleen measuring proximally 16 mm. This was seen on coronal image 46 of series 6. On the prior study this was present in retrospect but not as well seen with the level of the motion. Recommend further workup or follow-up evaluation when appropriate. Adrenals/Urinary Tract: The adrenal glands are preserved. Enhancing renal mass collecting system dilatation. Normal course and caliber of the ureters down to the bladder. Slight wall thickening of the urinary bladder, nonspecific. Unchanged from prior Stomach/Bowel: The large bowel again  has changes of right hemicolectomy. The large bowel is decompressed. There is some fluid in the rectum. Stomach is nondilated. Once again there are some distal small bowel loops which are fluid and air-filled and mildly dilated approaching up to 3.2 cm in diameter. Some of these these are featureless. The level of the bowel distention overall is decreasing from the study of February 19, 2023. There is significant stranding in the abdomen and a small amount ill-defined fluid near the margin of the anastomosis of the colectomy with a slightly redundant loop of bowel in this location. No new fluid collections or free air. Vascular/Lymphatic: Normal variant of a left-sided IVC. Normal caliber aorta and IVC with minimal atherosclerotic disease. No specific abnormal lymph node enlargement identified in the abdomen and pelvis. Reproductive: Mildly enlarged prostate. Other: Mild anasarca. Musculoskeletal: Curvature of the spine. Scattered degenerative changes seen of the spine and pelvis. IMPRESSION: Some interval improvement in the level of small-bowel dilatation with some residual dilated loops with fluid and air. These loops appear featureless in the right hemiabdomen. This could be a evolving ileus but would recommend continued surveillance. Stable surgical changes of right hemicolectomy. No new fluid collection, free air. Evolving opacities along bases now more confluence. Atelectasis versus infiltrate. Recommend continued follow-up. Subtle low-density ill-defined focus in the spleen of uncertain etiology and significance. Recommend dedicated evaluation when clinically appropriate or short follow-up. Enlarged prostate.  Wall thickening of the urinary bladder. Motion. Electronically Signed   By: Karen Kays M.D.   On: 02/25/2023 12:30   DG Abd 2 Views  Result Date: 02/24/2023 CLINICAL DATA:  Small-bowel obstruction EXAM: ABDOMEN - 2 VIEW COMPARISON:  Portable exam 0600 hours compared to 02/22/2023 FINDINGS: Persistent  gaseous distension of small bowel loops in mid abdomen, not significantly changed. Some colonic gas present. No bowel wall thickening or free air. Bibasilar atelectasis. Osseous demineralization with biconvex thoracolumbar scoliosis. Surgical clips RIGHT upper quadrant. IMPRESSION: Persistent small bowel dilatation. Electronically Signed   By: Ulyses Southward M.D.   On: 02/24/2023 08:43   DG Abd 2 Views  Result Date: 02/22/2023 CLINICAL DATA:  Fever, pain.  Ileus EXAM: ABDOMEN - 3 VIEW COMPARISON:  X-ray 02/21/2023 FINDINGS:  Enteric tube with tip along the body of the stomach. Surgical clips in the right midabdomen. Gas seen in nondilated loops of colon. There are some moderately dilated loops of bowel in the midabdomen measuring up to 4.5 cm. Overall these are slightly decreased from previous. No obvious free air seen beneath the diaphragm on the upright views. IMPRESSION: Slight interval decrease in dilated bowel in the midabdomen with significant residual. Enteric tube. Electronically Signed   By: Karen Kays M.D.   On: 02/22/2023 10:29   DG Chest Port 1 View  Result Date: 02/21/2023 CLINICAL DATA:  PICC line placement EXAM: PORTABLE CHEST 1 VIEW COMPARISON:  02/19/2023 FINDINGS: Shallow inspiration with atelectasis in the lung bases. Heart size and pulmonary vascularity are normal. No pleural effusions. No pneumothorax. Right PICC catheter is placed with tip over the cavoatrial junction region. An enteric tube is present. Tip is off the field of view but below the left hemidiaphragm. IMPRESSION: Right PICC catheter placed with tip overlying the cavoatrial junction region. No pneumothorax. Shallow inspiration with atelectasis in the bases. Electronically Signed   By: Burman Nieves M.D.   On: 02/21/2023 17:39   DG Abd 2 Views  Result Date: 02/21/2023 CLINICAL DATA:  Small bowel obstruction. EXAM: ABDOMEN - 2 VIEW COMPARISON:  02/20/2023 FINDINGS: NG tube tip is in the proximal stomach. Gaseous small  bowel distention in the central abdomen is similar to prior. Left colon is nondistended. Bones are diffusely demineralized. IMPRESSION: No substantial interval change. Electronically Signed   By: Kennith Center M.D.   On: 02/21/2023 08:46   [place] EKG SITE RITE  Result Date: 02/21/2023 If [facility] image not attached, placement could not be confirmed due to current cardiac rhythm.  DG Abd 1 View  Result Date: 02/20/2023 CLINICAL DATA:  Abdominal pain EXAM: ABDOMEN - 1 VIEW COMPARISON:  X-ray earlier 02/20/2023 and older FINDINGS: NG tube tip overlying the fundus of the stomach. Surgical clips in the right upper quadrant as well as bowel surgical changes. There is some air along nondilated loops colon on the left side. There are some loops of air in dilated small bowel as seen previously central and left midabdomen measuring up to 4.4 cm. This is consistent with a obstruction. Slight decrease in air-filled dilated loops in the right midabdomen. No obvious free air on these portable supine radiographs. Overlapping cardiac leads IMPRESSION: Persistent moderately dilated loops of small bowel in the midabdomen. Continued air in nondilated colon. Slight decrease in the level of small-bowel dilatation in the right midabdomen. Enteric tube.  Postop changes Electronically Signed   By: Karen Kays M.D.   On: 02/20/2023 20:10   DG Abd 1 View  Result Date: 02/20/2023 CLINICAL DATA:  [id number] SBO (small bowel obstruction) [id number] EXAM: ABDOMEN - 1 VIEW COMPARISON:  the previous day's study FINDINGS: Gastric tube has been advanced into the decompressed stomach. Some decrease in dilatation of distended bowel loops in the right and lower abdomen since previous exam. Right abdominal surgical clips stable. Regional bones unremarkable. IMPRESSION: Gastric tube advanced into the decompressed stomach, with partial decompression of bowel. Electronically Signed   By: Corlis Leak M.D.   On: 02/20/2023 09:31   DG Abdomen 1  View  Result Date: 02/19/2023 CLINICAL DATA:  52 year old male status post nasogastric tube placement. EXAM: ABDOMEN - 1 VIEW COMPARISON:  Abdominal radiograph 02/19/2023. FINDINGS: Nasogastric tube has been advanced slightly, now with side port approximately 3 cm distal to the gastroesophageal junction. Several dilated loops of  gas-filled small bowel remain evident in the visualized upper abdomen measuring up to 5.4 cm in diameter. Surgical clips project over the right upper quadrant of the abdomen, likely from prior cholecystectomy. IMPRESSION: 1. Support apparatus, as above. 2. Persistent dilatation of small-bowel loops concerning for small bowel obstruction, as above. Electronically Signed   By: Trudie Reed M.D.   On: 02/19/2023 07:41   DG Abd 1 View  Result Date: 02/19/2023 CLINICAL DATA:  52 year old male status post nasogastric tube placement. EXAM: ABDOMEN - 1 VIEW COMPARISON:  No priors. FINDINGS: Nasogastric tube noted with tip in the proximal stomach and side port just proximal to the distal gastroesophageal junction. Abdomen is incompletely imaged, but there appear to be multiple dilated loops of small bowel which are fluid-filled, concerning for bowel obstruction, measuring up to proximally 5.2 cm in diameter in the right upper quadrant of the abdomen. IMPRESSION: 1. Nasogastric tube in position, as above. Advancement of the tube approximately 10 cm for more optimal placement is suggested. 2. Dilated loops of small bowel concerning for small bowel obstruction, as above. Electronically Signed   By: Trudie Reed M.D.   On: 02/19/2023 06:33   CT CHEST ABDOMEN PELVIS W CONTRAST  Result Date: 02/19/2023 CLINICAL DATA:  Sepsis. EXAM: CT CHEST, ABDOMEN, AND PELVIS WITH CONTRAST TECHNIQUE: Multidetector CT imaging of the chest, abdomen and pelvis was performed following the standard protocol during bolus administration of intravenous contrast. RADIATION DOSE REDUCTION: This exam was performed  according to the departmental dose-optimization program which includes automated exposure control, adjustment of the mA and/or kV according to patient size and/or use of iterative reconstruction technique. CONTRAST:  OMNIPAQUE IOHEXOL 300 MG/ML  SOLN COMPARISON:  January 28, 2023 FINDINGS: CT CHEST FINDINGS Cardiovascular: No significant vascular findings. Normal heart size. No pericardial effusion. Mediastinum/Nodes: No enlarged mediastinal, hilar, or axillary lymph nodes. Thyroid gland, trachea, and esophagus demonstrate no significant findings. Lungs/Pleura: Marked severity patchy infiltrates are seen throughout the bilateral lower lobes. Mild right middle lobe infiltrate is also noted. There is no evidence of a pleural effusion or pneumothorax. Musculoskeletal: No chest wall mass or suspicious bone lesions identified. CT ABDOMEN PELVIS FINDINGS Hepatobiliary: No focal liver abnormality is seen. Status post cholecystectomy. No biliary dilatation. Pancreas: Unremarkable. No pancreatic ductal dilatation or surrounding inflammatory changes. Spleen: Normal in size without focal abnormality. Adrenals/Urinary Tract: Adrenal glands are unremarkable. Kidneys are normal, without renal calculi, focal lesion, or hydronephrosis. There is mild diffuse urinary bladder wall thickening. Stomach/Bowel: Stomach is within normal limits. Surgically anastomosed bowel is seen within the anterior aspect of the right upper quadrant, consistent with the patient's history of prior laparoscopic right colectomy. Surgical clips are also seen within the mid right abdomen. Numerous dilated small bowel loops are seen throughout the abdomen and pelvis (maximum small bowel diameter of approximately 4.3 cm). A gradual transition zone is seen within the lateral aspect of the mid to upper right abdomen. Vascular/Lymphatic: Aortic atherosclerosis. No enlarged abdominal or pelvic lymph nodes. Reproductive: Mild prostate gland enlargement is seen.  Other: A 4.2 cm x 1.8 cm fat containing right inguinal hernia is seen. An additional 2.8 cm x 1.2 cm fat containing left inguinal hernia is noted. There is a 4.4 cm x 2.2 cm fluid-filled left scrotal hernia. No abdominopelvic ascites. Musculoskeletal: No acute or significant osseous findings. IMPRESSION: 1. Marked severity bilateral lower lobe and mild right middle lobe infiltrate, consistent with multifocal pneumonia. 2. Findings consistent with a partial small bowel obstruction. 3. Evidence of  prior cholecystectomy and right colectomy. 4. Bilateral fat containing inguinal hernias. 5. Fluid-filled left scrotal hernia. 6. Mild prostate gland enlargement. 7. Mild diffuse urinary bladder wall thickening which may be, in part, secondary to chronic bladder outlet obstruction. Correlation with urinalysis is recommended to exclude the presence of acute cystitis. 8. Aortic atherosclerosis. Aortic Atherosclerosis (ICD10-I70.0). Electronically Signed   By: Aram Candela M.D.   On: 02/19/2023 03:43   DG Chest Port 1 View  Result Date: 02/19/2023 CLINICAL DATA:  Sepsis EXAM: PORTABLE CHEST 1 VIEW COMPARISON:  01/22/2019 FINDINGS: Lung volumes are small and there is left basilar atelectasis. No pneumothorax or pleural effusion. Cardiac size within normal limits. Pulmonary vascularity is normal. No acute bone abnormality. IMPRESSION: 1. Pulmonary hypoinflation. Electronically Signed   By: Helyn Numbers M.D.   On: 02/19/2023 02:42

## 2023-03-12 LAB — CEA: CEA: 2.8 ng/mL (ref 0.0–4.7)

## 2023-03-16 LAB — SURGICAL PATHOLOGY

## 2023-03-29 ENCOUNTER — Ambulatory Visit (INDEPENDENT_AMBULATORY_CARE_PROVIDER_SITE_OTHER): Payer: 59 | Admitting: Physician Assistant

## 2023-03-29 ENCOUNTER — Encounter: Payer: Self-pay | Admitting: Physician Assistant

## 2023-03-29 VITALS — BP 135/85 | HR 69 | Temp 98.0°F | Ht 70.0 in | Wt 151.4 lb

## 2023-03-29 DIAGNOSIS — Z08 Encounter for follow-up examination after completed treatment for malignant neoplasm: Secondary | ICD-10-CM

## 2023-03-29 DIAGNOSIS — Z09 Encounter for follow-up examination after completed treatment for conditions other than malignant neoplasm: Secondary | ICD-10-CM

## 2023-03-29 DIAGNOSIS — C189 Malignant neoplasm of colon, unspecified: Secondary | ICD-10-CM

## 2023-03-29 NOTE — Progress Notes (Signed)
[facility] POST-OP OFFICE VISIT  03/29/2023  HPI: Alec Campbell is a 52 y.o. male ~7 weeks s/p hand assisted laparoscopic right hemicolectomy and cholecystectomy for right colon cancer and cholelithiasis, complicated by readmission for PNA, ileus, and rhabdomyolysis   He has done much better since last visit No significant complaints Has been more constipated; mother plans to trial Miralax again as this has worked previously No fever, chills, nausea, emesis Incisions are healing well; epigastric incision with very minimal skin dehiscence, no drainage Appetite still decreased but utilizing protein supplementation; weight up 2 lbs No other complaints   Vital signs: BP 135/85   Pulse 69   Temp 98 F (36.7 C)   Ht 5\' 10"  (1.778 m)   Wt 151 lb 6.4 oz (68.7 kg)   SpO2 97%   BMI 21.72 kg/m    Physical Exam: Constitutional: Well appearing male, NAD Abdomen: Soft, non-tender, non-distended, no rebound/guarding Skin: Incisions are healing well. There is very small skin level dehiscence on epigastric port site, no erythema, no drainage   Assessment/Plan: This is a 52 y.o. male ~7 weeks s/p hand assisted laparoscopic right hemicolectomy and cholecystectomy for right colon cancer and cholelithiasis, complicated by readmission for PNA, ileus, and rhabdomyolysis    - No acute issues; doing well  - Okay to trial Miralax briefly   - Cover epigastric wound with superficial dressing as needed; okay to shower  - He has completed lifting restrictions  - Appreciate oncology assistance; surveillance   - He can follow up in 3 months; His mother understands to call with questions/concerns in the interim.   -- Lynden Oxford, PA-C [facility] 03/29/2023, 3:29 PM M-F: 7am - 4pm

## 2023-03-29 NOTE — Patient Instructions (Signed)
Follow up here in 3 months.  May resume Metamucil or Miralax if needed.  Keep the area covered until it heals fully.     Please call and ask to speak with a nurse if you develop questions or concerns.

## 2023-03-31 ENCOUNTER — Encounter: Payer: 59 | Admitting: Physician Assistant

## 2023-04-17 ENCOUNTER — Encounter: Payer: Self-pay | Admitting: Nurse Practitioner

## 2023-04-28 ENCOUNTER — Ambulatory Visit (INDEPENDENT_AMBULATORY_CARE_PROVIDER_SITE_OTHER): Payer: 59 | Admitting: Physician Assistant

## 2023-04-28 ENCOUNTER — Encounter: Payer: Self-pay | Admitting: Physician Assistant

## 2023-04-28 VITALS — BP 126/82 | HR 74 | Wt 155.6 lb

## 2023-04-28 DIAGNOSIS — K921 Melena: Secondary | ICD-10-CM

## 2023-04-28 DIAGNOSIS — D122 Benign neoplasm of ascending colon: Secondary | ICD-10-CM

## 2023-04-28 DIAGNOSIS — Z09 Encounter for follow-up examination after completed treatment for conditions other than malignant neoplasm: Secondary | ICD-10-CM

## 2023-04-28 DIAGNOSIS — C189 Malignant neoplasm of colon, unspecified: Secondary | ICD-10-CM

## 2023-04-28 NOTE — Progress Notes (Signed)
[facility] POST-OP OFFICE VISIT  04/28/2023  HPI: Alec Campbell is a 52 y.o. male s/p hand assisted laparoscopic right hemicolectomy and cholecystectomy for right colon cancer and cholelithiasis on 02/10/2023, complicated by readmission for PNA, ileus, and rhabdomyolysis   Last seen on 05/28 and at that time was doing well. He presents today with what sounds like an isolated incident of blood in his stool about a week ago according to his parents. ;His father states that the other day Alec Campbell told him he noticed blood in his stool so the next time he went to have a bowel movement his father cleaned him up and noticed some blood on the toilet paper. There was some scant blood in the stool itself. This has since stopped it seems. No history of hemorrhoids. He is otherwise doing very well. No abdominal pain, fever, chills, nausea, emesis. Appetite is improving and he has gained weight. Incisions remain well healed.   Vital signs: BP 126/82   Pulse 74   Wt 155 lb 9.6 oz (70.6 kg)   SpO2 97%   BMI 22.33 kg/m    Physical Exam: Constitutional: Well appearing male, NAD Abdomen: Soft, non-tender, non-distended, no rebound/guarding Skin: Laparotomy and Laparoscopic incisions are well healed GU: Chaperone present, external examination is benign, no hemorrhoids nor fissure, no evidence of blood   Assessment/Plan: This is a 52 y.o. male s/p hand assisted laparoscopic right hemicolectomy and cholecystectomy for right colon cancer and cholelithiasis on 02/10/2023, complicated by readmission for PNA, ileus, and rhabdomyolysis    - For now, this seems like a very isolated episode of blood per rectum without obvious source. Potentially could be residual ooze from anastomosis, potentially localized bleeding with BM/wiping, no evidence of hemorrhoids on examination. He is hemodynamically stable without complaint of dizziness nor SOB. I do not think he has significant anemia clinically.  Unfortunately, he is a very poor historian given his baseline. I will start with CBC to ensure he is not overtly anemic. Last Hgb on 05/09 was 10.7. If he has any significant decrease in this or the bleeding continues, then may need to pursue repeat imaging +/- potential referral back to gastroenterology. Patient's parents understand that if there are significant episodes of bleeding he should present to the ED as I am limited in clinic and without treatment or diagnostic modalities. Given this seems to be an isolated and minor incident and he is clinically doing well,I do not think he warrants emergent evaluation in the ED nor readmission at this moment. Will update patient's family with results. Patient's family in agreement with plan. They are welcome to call at any time with questions/concerns. I will otherwise see him as scheduled in August.   -- Alec Oxford, PA-C [facility] 04/28/2023, 3:09 PM M-F: 7am - 4pm

## 2023-04-28 NOTE — Patient Instructions (Signed)
Please get lab drawn at [facility]. You do not need an appointment.   If you have any concerns or questions, please feel free to call our office.   Minimally Invasive Partial Colectomy, Adult, Care After The following information offers guidance on how to care for yourself after your procedure. Your health care provider may also give you more specific instructions. If you have problems or questions, contact your health care provider. What can I expect after the surgery? After the procedure, it is common to have: Pain, bruising, and swelling. Bloating. Weakness and tiredness (fatigue). Changes to your bowel movements, especially having bowel movements more often. Follow these instructions at home: Medicines Take over-the-counter and prescription medicines only as told by your health care provider. If you were prescribed an antibiotic medicine, take it as told by your health care provider. Do not stop using the antibiotic even if you start to feel better. Ask your health care provider if the medicine prescribed to you: Requires you to avoid driving or using machinery. Can cause constipation. You may need to take these actions to prevent or treat constipation: Drink enough fluids to keep your urine pale yellow. Take over-the-counter or prescription medicines. Limit foods that are high in fat and processed sugars, such as fried or sweet foods. Eating and drinking Follow instructions from your health care provider about what you may eat and drink. Do not drink alcohol if your health care provider tells you not to drink. Eat a low-fiber diet for the first 4 weeks after surgery or as told by your health care provider.. Most people on a low-fiber eating plan should eat less than 10 grams (g) of fiber a day. Follow recommendations from your health care provider or dietitian about how much fiber you should have each day. Always check food labels to know the fiber content of packaged foods. In general, a  low-fiber food will have fewer than 2 g of fiber per serving. In general, try to avoid whole grains, raw fruits and vegetables, dried fruit, tough cuts of meat, nuts, and seeds. Incision care  Follow instructions from your health care provider about how to take care of your incisions. Make sure you: Wash your hands with soap and water for at least 20 seconds before and after you change your bandage (dressing). If soap and water are not available, use hand sanitizer. Change your dressing as told by your health care provider. Leave stitches (sutures), skin glue, or adhesive strips in place. These skin closures may need to stay in place for 2 weeks or longer. If adhesive strip edges start to loosen and curl up, you may trim the loose edges. Do not remove adhesive strips completely unless your health care provider tells you to do that. Check your incision area every day for signs of infection. Check for: More redness, swelling, or pain. Fluid or blood. Warmth. Pus or a bad smell. Activity Rest as told by your health care provider. Avoid sitting for a long time without moving. Get up to take short walks every 1-2 hours. This is important to improve blood flow and breathing. Ask for help if you feel weak or unsteady. You may have to avoid lifting. Ask your health care provider how much you can safely lift. Return to your normal activities as told by your health care provider. Ask your health care provider what activities are safe for you. General instructions Do not use any products that contain nicotine or tobacco. These products include cigarettes, chewing tobacco, and  vaping devices, such as e-cigarettes. If you need help quitting, ask your health care provider. Do not take baths, swim, or use a hot tub until your health care provider approves. Ask your health care provider if you may take showers. You may only be allowed to take sponge baths. Wear compression stockings as told by your health care  provider. These stockings help to prevent blood clots and reduce swelling in your legs. Keep all follow-up visits. This is important to monitor healing and check for any complications. Contact a health care provider if: Medicine is not controlling your pain. You have chills or fever. You have any signs of infection in your incision areas. You have a persistent cough. You have nausea or vomiting. You develop a rash. You have not had a bowel movement in 3 days. Get help right away if: You have severe pain. Your incisions break open after sutures or staples have been removed. You are bleeding from your rectum or have blood in your stool. You have a warm, tender swelling in your leg. You have chest pain or trouble breathing. You have increased swelling in the abdomen. You feel light-headed or you faint. These symptoms may be an emergency. Get help right away. Call 911. Do not wait to see if the symptoms will go away. Do not drive yourself to the hospital. Summary After surgery, it is common to have some pain, bruising, swelling, bloating, tiredness, weakness, or changes to your bowel movements. Follow instructions from your health care provider about what to eat and drink. Return to your normal activities as told by your health care provider. Check your incision area every day for signs of infection. Get help right away if you have chest pain or trouble breathing. This information is not intended to replace advice given to you by your health care provider. Make sure you discuss any questions you have with your health care provider. Document Revised: 02/03/2022 Document Reviewed: 02/03/2022 Elsevier Patient Education  2024 [employer].

## 2023-04-29 ENCOUNTER — Other Ambulatory Visit
Admission: RE | Admit: 2023-04-29 | Discharge: 2023-04-29 | Disposition: A | Discharge status: Home / Self Care | Payer: 59 | Attending: Physician Assistant | Admitting: Physician Assistant

## 2023-04-29 DIAGNOSIS — K921 Melena: Secondary | ICD-10-CM | POA: Insufficient documentation

## 2023-04-29 LAB — CBC
HCT: 38.1 % — ABNORMAL LOW (ref 39.0–52.0)
Hemoglobin: 12.7 g/dL — ABNORMAL LOW (ref 13.0–17.0)
MCH: 28.9 pg (ref 26.0–34.0)
MCHC: 33.3 g/dL (ref 30.0–36.0)
MCV: 86.8 fL (ref 80.0–100.0)
Platelets: 249 10*3/uL (ref 150–400)
RBC: 4.39 MIL/uL (ref 4.22–5.81)
RDW: 12.8 % (ref 11.5–15.5)
WBC: 10.7 10*3/uL — ABNORMAL HIGH (ref 4.0–10.5)
nRBC: 0 % (ref 0.0–0.2)

## 2023-06-09 ENCOUNTER — Ambulatory Visit: Payer: 59 | Admitting: Physician Assistant

## 2023-06-09 ENCOUNTER — Other Ambulatory Visit: Payer: Self-pay | Admitting: Family Medicine

## 2023-06-09 DIAGNOSIS — G40909 Epilepsy, unspecified, not intractable, without status epilepticus: Secondary | ICD-10-CM

## 2023-06-09 DIAGNOSIS — F79 Unspecified intellectual disabilities: Secondary | ICD-10-CM

## 2023-06-09 DIAGNOSIS — E785 Hyperlipidemia, unspecified: Secondary | ICD-10-CM

## 2023-06-09 NOTE — Progress Notes (Signed)
Name: Alec Campbell   MRN: [id number]    DOB: 1971-07-09   Date:06/10/2023       Progress Note  Subjective  Chief Complaint  Follow Up  HPI  Intellectual disability with epilepsy:  he lives with his parents, parents dispense his medications he also needs assistance with transportation, he is unable to manage his finances. Parents also need to be present during his office visits.   He developed seizures at 61 weeks of age , he was born with cleft lips and palate. He was diagnosed with developmental delay as a toddler. He is able to do ADL, but not able to do Instrumental activities of daily living , he does recognizes his medications but parents have to remind him when to take it . He is cooperative and doing well   Anemia: improved after surgery, recheck next visit    Seizure disorder: no symptoms in many years, mother is terrified of stopping his medication because his symptoms were severe. He has a history of  hyponatremia, he saw nephrologist for a period of time, but last sodium was back to normal.   He is now on only two pill of phenobarbital and 4 pills of carbamazepine daily without any side effects. Last levels were normal 12/2022   Dyslipidemia: he is now taking statin therapy, no side effects of medications, continue medications Last LDL was 88. Family history of heart disease and dyslipidemia   Constipation idiopathic: bowel movements three times a week, taking miralax prn, even after partial colectomy he denies dumping syndrome  History of partial colectomy for treatment of a colon mass that was found to be adenocarcinoma - April 2024. He is feeling well but has intermittent rectal bleeding. Surgeon is aware and may need to see GI to rule out hemorrhoids   Pathology report: 01/2023  MODERATELY DIFFERENTIATED ADENOCARCINOMA, INVASIVE INTO SUBMUCOSA.   Malnutrition: After colectomy and sepsis, but appetite is normal now and weight is trending up again. Used to be 171 lbs and is now 165  lbs, it went as low as 146 lbs   Patient Active Problem List   Diagnosis Date Noted   Colon adenocarcinoma (HCC) 03/10/2023   Hypophosphatemia 02/22/2023   AKI (acute kidney injury) (HCC) 02/21/2023   Hypokalemia 02/21/2023   Diarrhea 02/21/2023   Multifocal pneumonia 02/20/2023   Septic shock (HCC) 02/19/2023   SBO (small bowel obstruction) (HCC) 02/19/2023   S/P partial resection of colon 02/10/2023   Calculus of gallbladder with chronic cholecystitis without obstruction 02/10/2023   Colonic mass 01/13/2023   Gastritis, Helicobacter pylori 01/02/2023   Colon polyps 01/02/2023   Absolute anemia 12/30/2022   Adenomatous polyp of colon 12/30/2022   History of Helicobacter pylori infection 12/10/2022   History of anemia 12/10/2022   Inguinal hernia with gangrene, recurrent bilateral 06/17/2020   Cold sore 02/13/2016   Vitamin D deficiency 07/20/2015   Intellectual disability with epilepsy (HCC) 07/16/2015   Seizure disorder (HCC) 07/16/2015   Seasonal allergic rhinitis 07/16/2015   Dyslipidemia 07/16/2015   Hyponatremia 06/13/2015   Abnormal blood sugar 06/19/2008    Past Surgical History:  Procedure Laterality Date   ANKLE SURGERY Right    CHOLECYSTECTOMY N/A 02/10/2023   Procedure: LAPAROSCOPIC CHOLECYSTECTOMY;  Surgeon: Leafy Ro, MD;  Location: [facility] ORS;  Service: General;  Laterality: N/A;   CLEFT LIP REPAIR     COLONOSCOPY N/A 12/30/2022   Procedure: COLONOSCOPY;  Surgeon: Wyline Mood, MD;  Location: [facility] END[facility]COPY;  Service: Gastroenterology;  Laterality: N/A;  SPECIAL NEEDS - MOTHER NEEDS TO ACCOMPANY HIM.   ESOPHAGOGASTRODUODENOSCOPY (EGD) WITH PROPOFOL N/A 12/30/2022   Procedure: ESOPHAGOGASTRODUODENOSCOPY (EGD) WITH PROPOFOL;  Surgeon: Wyline Mood, MD;  Location: [facility] ENDOSCOPY;  Service: Gastroenterology;  Laterality: N/A;   HERNIA REPAIR     Inguinal   LAPAROSCOPIC RIGHT COLECTOMY N/A 02/10/2023   Procedure: LAPAROSCOPIC RIGHT COLECTOMY, hand assisted, RNFA  to assist;  Surgeon: Leafy Ro, MD;  Location: [facility] ORS;  Service: General;  Laterality: N/A;   PALATE SURGERY     TYMPANOSTOMY TUBE PLACEMENT      Family History  Problem Relation Age of Onset   Hypertension Mother    Thyroid disease Mother    CAD Father    Hypertension Father    Hyperlipidemia Father    Healthy Brother    Healthy Sister    Diabetes Brother    COPD Brother    Emphysema Brother    Alcohol abuse Brother    Hypertension Brother    Aneurysm Brother        brain    Social History   Tobacco Use   Smoking status: Never    Passive exposure: Never   Smokeless tobacco: Never  Substance Use Topics   Alcohol use: No    Alcohol/week: 0.0 standard drinks of alcohol     Current Outpatient Medications:    carbamazepine (TEGRETOL) 200 MG tablet, TAKE 2 TABLETS BY MOUTH IN THE MORNING, THEN TAKE  1 TABLET AT LUNCH AND TAKE 1 AT NIGHT, Disp: 360 tablet, Rfl: 1   chlorpheniramine (CHLOR-TRIMETON) 4 MG tablet, Take 4 mg by mouth 2 (two) times daily as needed for allergies., Disp: , Rfl:    Multiple Vitamin (MULTIVITAMIN WITH MINERALS) TABS tablet, Take 1 tablet by mouth daily., Disp: , Rfl:    PHENobarbital (LUMINAL) 64.8 MG tablet, Take 1 tablet (64.8 mg total) by mouth 2 (two) times daily., Disp: 180 tablet, Rfl: 1   PREVIDENT 5000 SENSITIVE 1.1-5 % GEL, Place onto teeth 2 (two) times daily., Disp: , Rfl:    rosuvastatin (CRESTOR) 10 MG tablet, Take 1 tablet (10 mg total) by mouth daily., Disp: 90 tablet, Rfl: 1   valACYclovir (VALTREX) 1000 MG tablet, TAKE 1 TABLET BY MOUTH TWICE DAILY AS NEEDED FOR  OUTBREAK, Disp: 30 tablet, Rfl: 0  No Known Allergies  I personally reviewed active problem list, medication list, allergies, family history, social history, health maintenance with the patient/caregiver today.   ROS  Ten systems reviewed and is negative except as mentioned in HPI    Objective  Vitals:   06/10/23 1031  BP: 118/70  Pulse: 84  Resp: 16   SpO2: 99%  Weight: 165 lb (74.8 kg)  Height: 5\' 7"  (1.702 m)    Body mass index is 25.84 kg/m.  Physical Exam  Constitutional: Patient appears well-developed and well-nourished.  No distress.  HEENT: head atraumatic, normocephalic, pupils equal and reactive to light, neck supple , history of cleft palate repair  Cardiovascular: Normal rate, regular rhythm and normal heart sounds.  No murmur heard. No BLE edema. Pulmonary/Chest: Effort normal and breath sounds normal. No respiratory distress. Abdominal: Soft.  There is no tenderness. Psychiatric: cooperative and calm   Recent Results (from the past 2160 hour(s))  CBC     Status: Abnormal   Collection Time: 04/29/23  3:23 PM  Result Value Ref Range   WBC 10.7 (H) 4.0 - 10.5 K/uL   RBC 4.39 4.22 - 5.81 MIL/uL   Hemoglobin 12.7 (L) 13.0 -  17.0 g/dL   HCT 16.1 (L) 09.6 - 04.5 %   MCV 86.8 80.0 - 100.0 fL   MCH 28.9 26.0 - 34.0 pg   MCHC 33.3 30.0 - 36.0 g/dL   RDW 40.9 81.1 - 91.4 %   Platelets 249 150 - 400 K/uL   nRBC 0.0 0.0 - 0.2 %    Comment: Performed at [facility], [address] [place], [place] [address]    PHQ2/9:    06/10/2023   10:32 AM 01/13/2023   11:09 AM 12/10/2022   10:57 AM 06/09/2022    9:10 AM 04/15/2022   10:23 AM  Depression screen PHQ 2/9  Decreased Interest 0 0 0 0 0  Down, Depressed, Hopeless 0 0 0 0 0  PHQ - 2 Score 0 0 0 0 0  Altered sleeping 0  0    Tired, decreased energy 0  0    Change in appetite 0  0    Feeling bad or failure about yourself  0  0    Trouble concentrating 0  0    Moving slowly or fidgety/restless 0  0    Suicidal thoughts 0  0    PHQ-9 Score 0  0      phq 9 is negative   Fall Risk:    06/10/2023   10:31 AM 03/08/2023   10:29 AM 01/31/2023    2:01 PM 01/24/2023    2:03 PM 12/10/2022   10:57 AM  Fall Risk   Falls in the past year? 0 0 0 0 0  Number falls in past yr: 0      Injury with Fall? 0      Risk for fall due to : No Fall Risks No Fall Risks   No Fall  Risks  Follow up Falls prevention discussed Falls prevention discussed   Falls prevention discussed;Education provided;Falls evaluation completed      Functional Status Survey: Is the patient deaf or have difficulty hearing?: No Does the patient have difficulty seeing, even when wearing glasses/contacts?: No Does the patient have difficulty concentrating, remembering, or making decisions?: No Does the patient have difficulty walking or climbing stairs?: No Does the patient have difficulty dressing or bathing?: No Does the patient have difficulty doing errands alone such as visiting a doctor's office or shopping?: No    Assessment & Plan  1. Seizure disorder (HCC)  - carbamazepine (TEGRETOL) 200 MG tablet; TAKE 2 TABLETS BY MOUTH IN THE MORNING, THEN TAKE  1 TABLET AT LUNCH AND TAKE 1 AT NIGHT  Dispense: 360 tablet; Refill: 2 - PHENobarbital (LUMINAL) 64.8 MG tablet; Take 1 tablet (64.8 mg total) by mouth 2 (two) times daily.  Dispense: 180 tablet; Refill: 2  2. Intellectual disability with epilepsy (HCC)  - carbamazepine (TEGRETOL) 200 MG tablet; TAKE 2 TABLETS BY MOUTH IN THE MORNING, THEN TAKE  1 TABLET AT LUNCH AND TAKE 1 AT NIGHT  Dispense: 360 tablet; Refill: 2 - PHENobarbital (LUMINAL) 64.8 MG tablet; Take 1 tablet (64.8 mg total) by mouth 2 (two) times daily.  Dispense: 180 tablet; Refill: 2  3. Dyslipidemia  - rosuvastatin (CRESTOR) 10 MG tablet; Take 1 tablet (10 mg total) by mouth daily.  Dispense: 90 tablet; Refill: 2  4. Cold sore  - valACYclovir (VALTREX) 1000 MG tablet; TAKE 1 TABLET BY MOUTH TWICE DAILY AS NEEDED FOR  OUTBREAK  Dispense: 30 tablet; Refill: 0  5. Anemia due to blood loss, acute  We will recheck next visit  6. Vitamin D deficiency  Continue vitamin D supplementation  7. S/P partial resection of colon   8. S/P cholecystectomy   9. History of colon cancer in adulthood

## 2023-06-10 ENCOUNTER — Ambulatory Visit (INDEPENDENT_AMBULATORY_CARE_PROVIDER_SITE_OTHER): Payer: 59 | Admitting: Family Medicine

## 2023-06-10 ENCOUNTER — Encounter: Payer: Self-pay | Admitting: Family Medicine

## 2023-06-10 VITALS — BP 118/70 | HR 84 | Resp 16 | Ht 67.0 in | Wt 165.0 lb

## 2023-06-10 DIAGNOSIS — E785 Hyperlipidemia, unspecified: Secondary | ICD-10-CM

## 2023-06-10 DIAGNOSIS — D62 Acute posthemorrhagic anemia: Secondary | ICD-10-CM

## 2023-06-10 DIAGNOSIS — E559 Vitamin D deficiency, unspecified: Secondary | ICD-10-CM

## 2023-06-10 DIAGNOSIS — Z1211 Encounter for screening for malignant neoplasm of colon: Secondary | ICD-10-CM

## 2023-06-10 DIAGNOSIS — Z85038 Personal history of other malignant neoplasm of large intestine: Secondary | ICD-10-CM | POA: Diagnosis not present

## 2023-06-10 DIAGNOSIS — Z9049 Acquired absence of other specified parts of digestive tract: Secondary | ICD-10-CM | POA: Diagnosis not present

## 2023-06-10 DIAGNOSIS — G40909 Epilepsy, unspecified, not intractable, without status epilepticus: Secondary | ICD-10-CM | POA: Diagnosis not present

## 2023-06-10 DIAGNOSIS — B001 Herpesviral vesicular dermatitis: Secondary | ICD-10-CM

## 2023-06-10 DIAGNOSIS — F79 Unspecified intellectual disabilities: Secondary | ICD-10-CM

## 2023-06-10 MED ORDER — ROSUVASTATIN CALCIUM 10 MG PO TABS: 10.0000 mg | ORAL_TABLET | Freq: Every day | ORAL | 2 refills | Status: DC

## 2023-06-10 MED ORDER — CARBAMAZEPINE 200 MG PO TABS: ORAL_TABLET | ORAL | 2 refills | Status: DC

## 2023-06-10 MED ORDER — VALACYCLOVIR HCL 1 G PO TABS
ORAL_TABLET | ORAL | 0 refills | Status: AC
Start: 2023-06-10 — End: ?

## 2023-06-10 MED ORDER — PHENOBARBITAL 64.8 MG PO TABS: 64.8000 mg | ORAL_TABLET | Freq: Two times a day (BID) | ORAL | 2 refills | Status: DC

## 2023-06-14 ENCOUNTER — Encounter: Payer: Self-pay | Admitting: Physician Assistant

## 2023-06-14 ENCOUNTER — Ambulatory Visit (INDEPENDENT_AMBULATORY_CARE_PROVIDER_SITE_OTHER): Payer: 59 | Admitting: Physician Assistant

## 2023-06-14 VITALS — BP 118/84 | HR 82 | Temp 98.4°F | Wt 163.2 lb

## 2023-06-14 DIAGNOSIS — D122 Benign neoplasm of ascending colon: Secondary | ICD-10-CM

## 2023-06-14 DIAGNOSIS — K6389 Other specified diseases of intestine: Secondary | ICD-10-CM

## 2023-06-14 DIAGNOSIS — K921 Melena: Secondary | ICD-10-CM | POA: Diagnosis not present

## 2023-06-14 DIAGNOSIS — Z09 Encounter for follow-up examination after completed treatment for conditions other than malignant neoplasm: Secondary | ICD-10-CM

## 2023-06-14 NOTE — Progress Notes (Signed)
[facility] NOTE  06/14/2023  History of Present Illness: Alec Campbell is a 52 y.o. male well known to our service following  hand assisted laparoscopic right hemicolectomy and cholecystectomy for right colon cancer and cholelithiasis on 02/10/2023, complicated by readmission for PNA, ileus, and rhabdomyolysis. He was last seen in June of this year secondary to scant episodes of blood in his stool. At that time, there was no evidence of hemorrhoid or fissure. Hgb was checked and found to be 12.7 (improved from 10.7 previously). Since that time, mother reports he has been doing well. He will have very sporadic occasions of small amounts of blood with wiping but these tend to follow a large, hard bowel movement. Otherwise, no fever, chills, nausea, emesis, abdominal pain, urinary changes, or bowel changes. Incisions are well healed. She reports he has regained his appetite. Otherwise back to baseline. No other complaints.   Past Medical History: Past Medical History:  Diagnosis Date   Allergy    Moderate mental retardation    Seizures (HCC)    Vitamin D deficiency      Past Surgical History: Past Surgical History:  Procedure Laterality Date   ANKLE SURGERY Right    CHOLECYSTECTOMY N/A 02/10/2023   Procedure: LAPAROSCOPIC CHOLECYSTECTOMY;  Surgeon: Leafy Ro, MD;  Location: [facility] ORS;  Service: General;  Laterality: N/A;   CLEFT LIP REPAIR     COLONOSCOPY N/A 12/30/2022   Procedure: COLONOSCOPY;  Surgeon: Wyline Mood, MD;  Location: [facility] ENDOSCOPY;  Service: Gastroenterology;  Laterality: N/A;  SPECIAL NEEDS - MOTHER NEEDS TO ACCOMPANY HIM.   ESOPHAGOGASTRODUODENOSCOPY (EGD) WITH PROPOFOL N/A 12/30/2022   Procedure: ESOPHAGOGASTRODUODENOSCOPY (EGD) WITH PROPOFOL;  Surgeon: Wyline Mood, MD;  Location: [facility] ENDOSCOPY;  Service: Gastroenterology;  Laterality: N/A;   HERNIA REPAIR     Inguinal   LAPAROSCOPIC RIGHT COLECTOMY N/A 02/10/2023   Procedure:  LAPAROSCOPIC RIGHT COLECTOMY, hand assisted, RNFA to assist;  Surgeon: Leafy Ro, MD;  Location: [facility] ORS;  Service: General;  Laterality: N/A;   PALATE SURGERY     TYMPANOSTOMY TUBE PLACEMENT      Home Medications: Prior to Admission medications   Medication Sig Start Date End Date Taking? Authorizing Provider  carbamazepine (TEGRETOL) 200 MG tablet TAKE 2 TABLETS BY MOUTH IN THE MORNING, THEN TAKE  1 TABLET AT LUNCH AND TAKE 1 AT NIGHT 06/10/23  Yes Sowles, Danna Hefty, MD  chlorpheniramine (CHLOR-TRIMETON) 4 MG tablet Take 4 mg by mouth 2 (two) times daily as needed for allergies.   Yes [provider]  Multiple Vitamin (MULTIVITAMIN WITH MINERALS) TABS tablet Take 1 tablet by mouth daily. 03/04/23  Yes Sunnie Nielsen, DO  PHENobarbital (LUMINAL) 64.8 MG tablet Take 1 tablet (64.8 mg total) by mouth 2 (two) times daily. 06/10/23  Yes Sowles, Danna Hefty, MD  PREVIDENT 5000 SENSITIVE 1.1-5 % GEL Place onto teeth 2 (two) times daily. 05/11/21  Yes [provider]  rosuvastatin (CRESTOR) 10 MG tablet Take 1 tablet (10 mg total) by mouth daily. 06/10/23  Yes Sowles, Danna Hefty, MD  valACYclovir (VALTREX) 1000 MG tablet TAKE 1 TABLET BY MOUTH TWICE DAILY AS NEEDED FOR  OUTBREAK 06/10/23  Yes Sowles, Danna Hefty, MD    Allergies: No Known Allergies  Review of Systems: Review of Systems  Constitutional:  Negative for chills and fever.  Respiratory:  Negative for cough and shortness of breath.   Cardiovascular:  Negative for chest pain and palpitations.  Gastrointestinal:  Negative for abdominal pain, constipation, diarrhea, nausea and vomiting.  All other systems reviewed and are negative.   Physical Exam BP 118/84   Pulse 82   Temp 98.4 F (36.9 C) (Oral)   Wt 163 lb 3.2 oz (74 kg)   SpO2 97%   BMI 25.56 kg/m   Physical Exam Vitals and nursing note reviewed. Exam conducted with a chaperone present.  Constitutional:      General: He is not in acute distress.    Appearance:  Normal appearance. He is not ill-appearing.     Comments: Patient in NAD; mother at bedside   Eyes:     General: No scleral icterus.    Conjunctiva/sclera: Conjunctivae normal.  Cardiovascular:     Rate and Rhythm: Normal rate.     Pulses: Normal pulses.     Heart sounds: No murmur heard. Pulmonary:     Effort: Pulmonary effort is normal. No respiratory distress.  Abdominal:     General: A surgical scar is present. There is no distension.     Tenderness: There is no abdominal tenderness. There is no guarding or rebound.     Comments: Abdomen is soft, non-tender, non-distended, no rebound/guarding. Incisions are well healed.   Genitourinary:    Comments: Deferred Musculoskeletal:     Right lower leg: No edema.     Left lower leg: No edema.  Skin:    General: Skin is warm and dry.     Coloration: Skin is not jaundiced or pale.     Findings: No erythema.  Neurological:     Mental Status: He is alert. Mental status is at baseline.     Labs/Imaging: No new pertinent imaging studies    Assessment and Plan: This is a 51 y.o. male s/p hand assisted laparoscopic right hemicolectomy and cholecystectomy for right colon cancer and cholelithiasis on 02/10/2023, complicated by readmission for PNA, ileus, and rhabdomyolysis    - Clinically doing very well, back to baseline, appetite normalized.   - suspect scant episodes of faint blood are secondary to straining with BM from constipation. Hgb on recheck back to normal and these episodes ar sporadic. Never had evidence of hemorrhoids on examination. Small amount of blood unlikely to be from anastomosis and he is well out of this window.   - Nothing further from surgical perspective at this time.   - Again appreciate oncology recommendations; surveillance  - We will be happy to see him on as needed basis. Patient's mother understands to call with any questions or concerns.  Face-to-face time spent with the patient and care providers was 20  minutes, with more than 50% of the time spent counseling, educating, and coordinating care of the patient.     Lynden Oxford, PA-C [facility] 06/14/2023, 3:47 PM M-F: 7am - 4pm

## 2023-06-14 NOTE — Patient Instructions (Signed)
Minimally Invasive Partial Colectomy, Adult, Care After The following information offers guidance on how to care for yourself after your procedure. Your health care provider may also give you more specific instructions. If you have problems or questions, contact your health care provider. What can I expect after the surgery? After the procedure, it is common to have: Pain, bruising, and swelling. Bloating. Weakness and tiredness (fatigue). Changes to your bowel movements, especially having bowel movements more often. Follow these instructions at home: Medicines Take over-the-counter and prescription medicines only as told by your health care provider. If you were prescribed an antibiotic medicine, take it as told by your health care provider. Do not stop using the antibiotic even if you start to feel better. Ask your health care provider if the medicine prescribed to you: Requires you to avoid driving or using machinery. Can cause constipation. You may need to take these actions to prevent or treat constipation: Drink enough fluids to keep your urine pale yellow. Take over-the-counter or prescription medicines. Limit foods that are high in fat and processed sugars, such as fried or sweet foods. Eating and drinking Follow instructions from your health care provider about what you may eat and drink. Do not drink alcohol if your health care provider tells you not to drink. Eat a low-fiber diet for the first 4 weeks after surgery or as told by your health care provider.. Most people on a low-fiber eating plan should eat less than 10 grams (g) of fiber a day. Follow recommendations from your health care provider or dietitian about how much fiber you should have each day. Always check food labels to know the fiber content of packaged foods. In general, a low-fiber food will have fewer than 2 g of fiber per serving. In general, try to avoid whole grains, raw fruits and vegetables, dried fruit, tough  cuts of meat, nuts, and seeds. Incision care  Follow instructions from your health care provider about how to take care of your incisions. Make sure you: Wash your hands with soap and water for at least 20 seconds before and after you change your bandage (dressing). If soap and water are not available, use hand sanitizer. Change your dressing as told by your health care provider. Leave stitches (sutures), skin glue, or adhesive strips in place. These skin closures may need to stay in place for 2 weeks or longer. If adhesive strip edges start to loosen and curl up, you may trim the loose edges. Do not remove adhesive strips completely unless your health care provider tells you to do that. Check your incision area every day for signs of infection. Check for: More redness, swelling, or pain. Fluid or blood. Warmth. Pus or a bad smell. Activity Rest as told by your health care provider. Avoid sitting for a long time without moving. Get up to take short walks every 1-2 hours. This is important to improve blood flow and breathing. Ask for help if you feel weak or unsteady. You may have to avoid lifting. Ask your health care provider how much you can safely lift. Return to your normal activities as told by your health care provider. Ask your health care provider what activities are safe for you. General instructions Do not use any products that contain nicotine or tobacco. These products include cigarettes, chewing tobacco, and vaping devices, such as e-cigarettes. If you need help quitting, ask your health care provider. Do not take baths, swim, or use a hot tub until your health care provider  approves. Ask your health care provider if you may take showers. You may only be allowed to take sponge baths. Wear compression stockings as told by your health care provider. These stockings help to prevent blood clots and reduce swelling in your legs. Keep all follow-up visits. This is important to monitor  healing and check for any complications. Contact a health care provider if: Medicine is not controlling your pain. You have chills or fever. You have any signs of infection in your incision areas. You have a persistent cough. You have nausea or vomiting. You develop a rash. You have not had a bowel movement in 3 days. Get help right away if: You have severe pain. Your incisions break open after sutures or staples have been removed. You are bleeding from your rectum or have blood in your stool. You have a warm, tender swelling in your leg. You have chest pain or trouble breathing. You have increased swelling in the abdomen. You feel light-headed or you faint. These symptoms may be an emergency. Get help right away. Call 911. Do not wait to see if the symptoms will go away. Do not drive yourself to the hospital. Summary After surgery, it is common to have some pain, bruising, swelling, bloating, tiredness, weakness, or changes to your bowel movements. Follow instructions from your health care provider about what to eat and drink. Return to your normal activities as told by your health care provider. Check your incision area every day for signs of infection. Get help right away if you have chest pain or trouble breathing. This information is not intended to replace advice given to you by your health care provider. Make sure you discuss any questions you have with your health care provider. Document Revised: 02/03/2022 Document Reviewed: 02/03/2022 Elsevier Patient Education  2024 [employer].

## 2023-07-14 ENCOUNTER — Ambulatory Visit (INDEPENDENT_AMBULATORY_CARE_PROVIDER_SITE_OTHER): Payer: 59

## 2023-07-14 VITALS — Ht 67.0 in | Wt 163.0 lb

## 2023-07-14 DIAGNOSIS — Z0000 Encounter for general adult medical examination without abnormal findings: Secondary | ICD-10-CM

## 2023-07-14 NOTE — Patient Instructions (Addendum)
Alec Campbell , Thank you for taking time to come for your Medicare Wellness Visit. I appreciate your ongoing commitment to your health goals. Please review the following plan we discussed and let me know if I can assist you in the future.   Referrals/Orders/Follow-Ups/Clinician Recommendations: none  This is a list of the screening recommended for you and due dates:  Health Maintenance  Topic Date Due   Flu Shot  06/02/2023   COVID-19 Vaccine (5 - 2023-24 season) 07/03/2023   Medicare Annual Wellness Visit  07/13/2024   DTaP/Tdap/Td vaccine (3 - Td or Tdap) 12/10/2030   Colon Cancer Screening  12/29/2032   Hepatitis C Screening  Completed   Zoster (Shingles) Vaccine  Completed   HPV Vaccine  Aged Out   Cologuard (Stool DNA test)  Discontinued   HIV Screening  Discontinued    Advanced directives: (Declined) Advance directive discussed with you today. Even though you declined this today, please call our office should you change your mind, and we can give you the proper paperwork for you to fill out.  Next Medicare Annual Wellness Visit scheduled for next year: Yes  07/19/24 @ 3:05pm telephone

## 2023-07-14 NOTE — Progress Notes (Signed)
Subjective:   Alec Campbell is a 52 y.o. male who presents for Medicare Annual/Subsequent preventive examination.  Visit Complete: Virtual  I connected with Sharman Cheek and  Carla Drape on 07/14/23 by a audio enabled telemedicine application and verified that I am speaking with the correct person using two identifiers.  Vital Signs: Unable to obtain new vitals due to this being a telehealth visit.  Patient Location: Home  Provider Location: Office/Clinic  I discussed the limitations of evaluation and management by telemedicine. The patient expressed understanding and agreed to proceed.  Patient Medicare AWV questionnaire was completed by the patient on (not done); I have confirmed that all information answered by patient is correct and no changes since this date.  Review of Systems    Cardiac Risk Factors include: dyslipidemia;male gender    Objective:    Today's Vitals   07/14/23 1454  Weight: 163 lb (73.9 kg)  Height: 5\' 7"  (1.702 m)   Body mass index is 25.53 kg/m.     07/14/2023    3:03 PM 03/10/2023    1:47 PM 02/19/2023    9:00 AM 02/10/2023    6:14 AM 01/31/2023    3:52 PM 04/05/2019   12:05 PM 02/28/2018    9:23 AM  Advanced Directives  Does Patient Have a Medical Advance Directive? No No No No No No No  Would patient like information on creating a medical advance directive?  No - Patient declined No - Guardian declined;No - Patient declined No - Patient declined No - Patient declined No - Guardian declined Yes (MAU/Ambulatory/Procedural Areas - Information given)    Current Medications (verified) Outpatient Encounter Medications as of 07/14/2023  Medication Sig   carbamazepine (TEGRETOL) 200 MG tablet TAKE 2 TABLETS BY MOUTH IN THE MORNING, THEN TAKE  1 TABLET AT LUNCH AND TAKE 1 AT NIGHT   chlorpheniramine (CHLOR-TRIMETON) 4 MG tablet Take 4 mg by mouth 2 (two) times daily as needed for allergies.   Multiple Vitamin (MULTIVITAMIN WITH MINERALS) TABS tablet Take  1 tablet by mouth daily.   PHENobarbital (LUMINAL) 64.8 MG tablet Take 1 tablet (64.8 mg total) by mouth 2 (two) times daily.   PREVIDENT 5000 SENSITIVE 1.1-5 % GEL Place onto teeth 2 (two) times daily.   rosuvastatin (CRESTOR) 10 MG tablet Take 1 tablet (10 mg total) by mouth daily.   valACYclovir (VALTREX) 1000 MG tablet TAKE 1 TABLET BY MOUTH TWICE DAILY AS NEEDED FOR  OUTBREAK   No facility-administered encounter medications on file as of 07/14/2023.    Allergies (verified) Patient has no known allergies.   History: Past Medical History:  Diagnosis Date   Allergy    Moderate mental retardation    Seizures (HCC)    Vitamin D deficiency    Past Surgical History:  Procedure Laterality Date   ANKLE SURGERY Right    CHOLECYSTECTOMY N/A 02/10/2023   Procedure: LAPAROSCOPIC CHOLECYSTECTOMY;  Surgeon: Leafy Ro, MD;  Location: [facility] ORS;  Service: General;  Laterality: N/A;   CLEFT LIP REPAIR     COLONOSCOPY N/A 12/30/2022   Procedure: COLONOSCOPY;  Surgeon: Wyline Mood, MD;  Location: [facility] ENDOSCOPY;  Service: Gastroenterology;  Laterality: N/A;  SPECIAL NEEDS - MOTHER NEEDS TO ACCOMPANY HIM.   ESOPHAGOGASTRODUODENOSCOPY (EGD) WITH PROPOFOL N/A 12/30/2022   Procedure: ESOPHAGOGASTRODUODENOSCOPY (EGD) WITH PROPOFOL;  Surgeon: Wyline Mood, MD;  Location: [facility] ENDOSCOPY;  Service: Gastroenterology;  Laterality: N/A;   HERNIA REPAIR     Inguinal   LAPAROSCOPIC RIGHT COLECTOMY N/A 02/10/2023  Procedure: LAPAROSCOPIC RIGHT COLECTOMY, hand assisted, RNFA to assist;  Surgeon: Leafy Ro, MD;  Location: [facility] ORS;  Service: General;  Laterality: N/A;   PALATE SURGERY     TYMPANOSTOMY TUBE PLACEMENT     Family History  Problem Relation Age of Onset   Hypertension Mother    Thyroid disease Mother    CAD Father    Hypertension Father    Hyperlipidemia Father    Healthy Brother    Healthy Sister    Diabetes Brother    COPD Brother    Emphysema Brother    Alcohol abuse Brother     Hypertension Brother    Aneurysm Brother        brain   Social History   Socioeconomic History   Marital status: Single    Spouse name: Not on file   Number of children: 0   Years of education: Handicapped diploma   Highest education level: Not on file  Occupational History    Employer: DISABLED  Tobacco Use   Smoking status: Never    Passive exposure: Never   Smokeless tobacco: Never  Vaping Use   Vaping status: Never Used  Substance and Sexual Activity   Alcohol use: No    Alcohol/week: 0.0 standard drinks of alcohol   Drug use: No   Sexual activity: Never  Other Topics Concern   Not on file  Social History Narrative   Moderate mental retardation. Parents are the primary caregiver, he lives with them   Social Determinants of Health   Financial Resource Strain: Low Risk  (07/14/2023)   Overall Financial Resource Strain (CARDIA)    Difficulty of Paying Living Expenses: Not hard at all  Food Insecurity: No Food Insecurity (07/14/2023)   Hunger Vital Sign    Worried About Running Out of Food in the Last Year: Never true    Ran Out of Food in the Last Year: Never true  Transportation Needs: No Transportation Needs (07/14/2023)   PRAPARE - Administrator, Civil Service (Medical): No    Lack of Transportation (Non-Medical): No  Physical Activity: Inactive (07/14/2023)   Exercise Vital Sign    Days of Exercise per Week: 7 days    Minutes of Exercise per Session: 0 min  Stress: No Stress Concern Present (07/14/2023)   [employer] of Occupational Health - Occupational Stress Questionnaire    Feeling of Stress : Not at all  Social Connections: Socially Isolated (07/14/2023)   Social Connection and Isolation Panel [NHANES]    Frequency of Communication with Friends and Family: Twice a week    Frequency of Social Gatherings with Friends and Family: Once a week    Attends Religious Services: Never    Database administrator or Organizations: No    Attends Probation officer: Never    Marital Status: Never married    Tobacco Counseling Counseling given: Not Answered   Clinical Intake:  Pre-visit preparation completed: Yes  Pain : No/denies pain (mother says not complained today)    BMI - recorded: 25.53 Nutritional Risks: None Diabetes: No  How often do you need to have someone help you when you read instructions, pamphlets, or other written materials from your doctor or pharmacy?: 1 - Never  Interpreter Needed?: No  Comments: lives with parents Information entered by :: B.Kiari Hosmer,LPN   Activities of Daily Living    07/14/2023    3:04 PM 06/10/2023   10:32 AM  In your present state of  health, do you have any difficulty performing the following activities:  Hearing? 0 0  Vision? 0 0  Difficulty concentrating or making decisions? 1 0  Walking or climbing stairs? 0 0  Dressing or bathing? 0 0  Doing errands, shopping? 1 0  Preparing Food and eating ? N   Using the Toilet? N   In the past six months, have you accidently leaked urine? N   Do you have problems with loss of bowel control? N   Managing your Medications? N   Managing your Finances? N   Housekeeping or managing your Housekeeping? N     Patient Care Team: Alba Cory, MD as PCP - General (Family Medicine) Benita Gutter, RN as Oncology Nurse Navigator Michaelyn Barter, MD as Consulting Physician (Oncology)  Indicate any recent Medical Services you may have received from other than [facility] providers in the past year (date may be approximate).     Assessment:   This is a routine wellness examination for [place].  Hearing/Vision screen Hearing Screening - Comments:: Adequate hearing Vision Screening - Comments:: Adequate vision   Goals Addressed             This Visit's Progress    COMPLETED: DIET - INCREASE WATER INTAKE   On track    Recommend to drink at least 6-8 8oz glasses of water per day.       Depression Screen    07/14/2023     3:01 PM 06/10/2023   10:32 AM 01/13/2023   11:09 AM 12/10/2022   10:57 AM 06/09/2022    9:10 AM 04/15/2022   10:23 AM 12/10/2021    9:11 AM  PHQ 2/9 Scores  PHQ - 2 Score 0 0 0 0 0 0 0  PHQ- 9 Score  0  0   0    Fall Risk    07/14/2023    2:59 PM 06/10/2023   10:31 AM 03/08/2023   10:29 AM 01/31/2023    2:01 PM 01/24/2023    2:03 PM  Fall Risk   Falls in the past year? 0 0 0 0 0  Number falls in past yr: 0 0     Injury with Fall? 0 0     Risk for fall due to : No Fall Risks No Fall Risks No Fall Risks    Follow up Education provided;Falls prevention discussed Falls prevention discussed Falls prevention discussed      MEDICARE RISK AT HOME: Medicare Risk at Home Any stairs in or around the home?: Yes If so, are there any without handrails?: Yes Home free of loose throw rugs in walkways, pet beds, electrical cords, etc?: Yes Adequate lighting in your home to reduce risk of falls?: Yes Life alert?: No Use of a cane, walker or w/c?: No Grab bars in the bathroom?: No Shower chair or bench in shower?: No Elevated toilet seat or a handicapped toilet?: No  TIMED UP AND GO:  Was the test performed?  No    Cognitive Function: not done:mother completed most questions:pt walked off they are at [employer]        Immunizations Immunization History  Administered Date(s) Administered   Influenza Split 10/06/2009, 07/21/2012   Influenza, Seasonal, Injecte, Preservative Fre 06/23/2010   Influenza,inj,Quad PF,6+ Mos 07/23/2013, 08/02/2014, 07/18/2015, 07/21/2016, 08/03/2017, 08/07/2018, 08/03/2019, 08/11/2020, 08/26/2021, 08/04/2022   Influenza-Unspecified 08/02/2014   Moderna Sars-Covid-2 Vaccination 12/05/2019, 01/03/2020, 09/02/2020, 01/18/2021   Tdap 06/23/2010, 12/10/2020   Zoster Recombinant(Shingrix) 06/09/2020, 08/11/2020    TDAP status: Up  to date  Flu Vaccine status: Up to date  Covid-19 vaccine status: Completed vaccines  Qualifies for Shingles Vaccine? Yes   Zostavax  completed Yes   Shingrix Completed?: Yes  Screening Tests Health Maintenance  Topic Date Due   INFLUENZA VACCINE  06/02/2023   COVID-19 Vaccine (5 - 2023-24 season) 07/03/2023   Medicare Annual Wellness (AWV)  07/13/2024   DTaP/Tdap/Td (3 - Td or Tdap) 12/10/2030   Colonoscopy  12/29/2032   Hepatitis C Screening  Completed   Zoster Vaccines- Shingrix  Completed   HPV VACCINES  Aged Out   Fecal DNA (Cologuard)  Discontinued   HIV Screening  Discontinued    Health Maintenance  Health Maintenance Due  Topic Date Due   INFLUENZA VACCINE  06/02/2023   COVID-19 Vaccine (5 - 2023-24 season) 07/03/2023    Colorectal cancer screening: Type of screening: Colonoscopy. Completed yes. Repeat every 10 years  Lung Cancer Screening: (Low Dose CT Chest recommended if Age 42-80 years, 20 pack-year currently smoking OR have quit w/in 15years.) does not qualify.   Lung Cancer Screening Referral: no  Additional Screening:  Hepatitis C Screening: does not qualify; Completed yes  Vision Screening: Recommended annual ophthalmology exams for early detection of glaucoma and other disorders of the eye. Is the patient up to date with their annual eye exam?  no Who is the provider or what is the name of the office in which the patient attends annual eye exams? none If pt is not established with a provider, would they like to be referred to a provider to establish care? No . Mother declines  Dental Screening: Recommended annual dental exams for proper oral hygiene  Diabetic Foot Exam: n/a  Community Resource Referral / Chronic Care Management: CRR required this visit?  No   CCM required this visit?  No    Plan:     I have personally reviewed and noted the following in the patient's chart:   Medical and social history Use of alcohol, tobacco or illicit drugs  Current medications and supplements including opioid prescriptions. Patient is not currently taking opioid  prescriptions. Functional ability and status Nutritional status Physical activity Advanced directives List of other physicians Hospitalizations, surgeries, and ER visits in previous 12 months Vitals Screenings to include cognitive, depression, and falls Referrals and appointments  In addition, I have reviewed and discussed with patient certain preventive protocols, quality metrics, and best practice recommendations. A written personalized care plan for preventive services as well as general preventive health recommendations were provided to patient.     Sue Lush, LPN   1/61/0960   After Visit Summary: (MyChart) Due to this being a telephonic visit, the after visit summary with patients personalized plan was offered to patient via MyChart   Nurse Notes: Mother states pt is doing well. She has no concerns or questions at this time.

## 2023-08-11 ENCOUNTER — Ambulatory Visit (INDEPENDENT_AMBULATORY_CARE_PROVIDER_SITE_OTHER): Payer: 59

## 2023-08-11 DIAGNOSIS — Z23 Encounter for immunization: Secondary | ICD-10-CM | POA: Diagnosis not present

## 2023-08-23 ENCOUNTER — Encounter: Payer: Self-pay | Admitting: Physician Assistant

## 2023-08-23 ENCOUNTER — Ambulatory Visit (INDEPENDENT_AMBULATORY_CARE_PROVIDER_SITE_OTHER): Payer: 59 | Admitting: Physician Assistant

## 2023-08-23 VITALS — BP 114/76 | HR 98 | Temp 97.1°F | Resp 16 | Ht 67.0 in | Wt 167.2 lb

## 2023-08-23 DIAGNOSIS — M778 Other enthesopathies, not elsewhere classified: Secondary | ICD-10-CM | POA: Diagnosis not present

## 2023-08-23 DIAGNOSIS — M25511 Pain in right shoulder: Secondary | ICD-10-CM

## 2023-08-23 NOTE — Patient Instructions (Signed)
  I recommend the following at this time to help relieve that discomfort:  Rest Warm compresses to the area (20 minutes on, minimum of 30 minutes off) You can alternate Tylenol and Ibuprofen for pain management but Ibuprofen is typically preferred to reduce inflammation.  You can take up to 3200 mg of Ibuprofen per 24 hours and up to 3500 mg of tylenol per 24 hours  Gentle stretches and exercises that I have included in your paperwork Try to reduce excess strain to the area and rest as much as possible   If these measures do not lead to improvement in your symptoms over the next 2-4 weeks please let us know

## 2023-08-23 NOTE — Progress Notes (Signed)
Acute Office Visit   Patient: Alec Campbell   DOB: 1971-01-21   52 y.o. Male  MRN: [id number] Visit Date: 08/23/2023  Today's healthcare provider: Oswaldo Conroy Metha Kolasa, PA-C  Introduced myself to the patient as a Secondary school teacher and provided education on APPs in clinical practice.    Chief Complaint  Patient presents with   Shoulder Pain    R shoulder unsure the cause, onset for a month   Subjective    HPI HPI     Shoulder Pain    Additional comments: R shoulder unsure the cause, onset for a month      Last edited by Forde Radon, CMA on 08/23/2023 11:07 AM.       Patient is here with his Mother who is assisting with HPI   Right shoulder pain He is left hand dominant  Onset: gradual  Duration: 3-4 weeks that mother is aware of  Location: right shoulder - all of the joint hurts per patient  Radiation:  he reports that pain travels down his arm  Pain level and character: moderate pain? Unsure of character of pain  Other associated symptoms: he is not able to raise arm all the way compared to left (unable to forward flex), he denies numbness  Interventions: Nothing  Alleviating: nothing  Aggravating: moving it   He reports feeling elbow popping when he extends arm He denies falls or other trauma to the area  His mother reports that he is on his phone a lot watching movies with arm bent toward face   Medications: Outpatient Medications Prior to Visit  Medication Sig   carbamazepine (TEGRETOL) 200 MG tablet TAKE 2 TABLETS BY MOUTH IN THE MORNING, THEN TAKE  1 TABLET AT LUNCH AND TAKE 1 AT NIGHT   chlorpheniramine (CHLOR-TRIMETON) 4 MG tablet Take 4 mg by mouth 2 (two) times daily as needed for allergies.   Multiple Vitamin (MULTIVITAMIN WITH MINERALS) TABS tablet Take 1 tablet by mouth daily.   PHENobarbital (LUMINAL) 64.8 MG tablet Take 1 tablet (64.8 mg total) by mouth 2 (two) times daily.   PREVIDENT 5000 SENSITIVE 1.1-5 % GEL Place onto teeth 2 (two) times  daily.   rosuvastatin (CRESTOR) 10 MG tablet Take 1 tablet (10 mg total) by mouth daily.   valACYclovir (VALTREX) 1000 MG tablet TAKE 1 TABLET BY MOUTH TWICE DAILY AS NEEDED FOR  OUTBREAK   No facility-administered medications prior to visit.    Review of Systems  Musculoskeletal:  Positive for arthralgias and myalgias.        Objective    BP 114/76   Pulse 98   Temp (!) 97.1 F (36.2 C) (Temporal)   Resp 16   Ht 5\' 7"  (1.702 m)   Wt 167 lb 3.2 oz (75.8 kg)   SpO2 100%   BMI 26.19 kg/m     Physical Exam Vitals reviewed.  Constitutional:      General: He is awake.     Appearance: Normal appearance. He is well-developed and well-groomed.  HENT:     Head: Normocephalic and atraumatic.  Musculoskeletal:     Right shoulder: No swelling, deformity or crepitus. Decreased range of motion. Normal strength.     Left shoulder: Normal. Normal range of motion.     Comments: Patient able to internally and externally rotate right arm at shoulder but elbow is dropped a bit Able to forward flex to approx 120 degrees and extend normally  Adduction intact  Abduction  intact to 90 degrees on right side   Neurological:     Mental Status: He is alert.  Psychiatric:        Behavior: Behavior is cooperative.       No results found for any visits on 08/23/23.  Assessment & Plan      No follow-ups on file.      Problem List Items Addressed This Visit   None Visit Diagnoses     Capsulitis of right shoulder    -  Primary Acute, ongoing concern Patient and mother report that patient has been having pain in right shoulder and difficulty raising right arm above head  Physical exam is consistent with capsulitis vs rotator cuff injury  Reviewed options for management- home exercisies, physical therapy, ref to Ortho  Mother wishes to try home exercises and OTC regimen first  Provided stretches and exercises to improve ROM. Recommend he takes breaks from phone throughout the day Can  use warm compresses, OTC Tylenol and Ibuprofen, gentle massage and stretches as tolerated. If not improving in 2-4 weeks or worsening, recommend follow up in office and may need imaging Follow up as needed for persistent or progressing symptoms     Acute pain of right shoulder            No follow-ups on file.   I, Ryelynn Guedea E Mahoganie Basher, PA-C, have reviewed all documentation for this visit. The documentation on 08/23/23 for the exam, diagnosis, procedures, and orders are all accurate and complete.   Jacquelin Hawking, MHS, PA-C [facility]

## 2023-11-18 ENCOUNTER — Telehealth: Payer: Self-pay

## 2023-11-18 NOTE — Telephone Encounter (Signed)
Is pt ok to get it?

## 2023-11-18 NOTE — Telephone Encounter (Signed)
Copied from CRM [id number]. Topic: General - Other >> Nov 18, 2023  2:54 PM Santiya F wrote: Reason for CRM: Pt's mother is calling in wanting to know if pt has had his pneumonia shot this year.

## 2023-11-21 NOTE — Telephone Encounter (Signed)
 Pt mom verbalized understanding

## 2023-12-16 ENCOUNTER — Other Ambulatory Visit: Payer: Self-pay | Admitting: Family Medicine

## 2023-12-16 DIAGNOSIS — G40909 Epilepsy, unspecified, not intractable, without status epilepticus: Secondary | ICD-10-CM

## 2024-01-13 ENCOUNTER — Encounter: Payer: Self-pay | Admitting: Family Medicine

## 2024-01-13 ENCOUNTER — Ambulatory Visit (INDEPENDENT_AMBULATORY_CARE_PROVIDER_SITE_OTHER): Payer: 59 | Admitting: Family Medicine

## 2024-01-13 ENCOUNTER — Ambulatory Visit: Payer: 59 | Admitting: Family Medicine

## 2024-01-13 VITALS — BP 132/80 | HR 78 | Resp 16 | Ht 67.0 in | Wt 173.0 lb

## 2024-01-13 DIAGNOSIS — Z79899 Other long term (current) drug therapy: Secondary | ICD-10-CM

## 2024-01-13 DIAGNOSIS — K5904 Chronic idiopathic constipation: Secondary | ICD-10-CM

## 2024-01-13 DIAGNOSIS — M25811 Other specified joint disorders, right shoulder: Secondary | ICD-10-CM

## 2024-01-13 DIAGNOSIS — G40909 Epilepsy, unspecified, not intractable, without status epilepticus: Secondary | ICD-10-CM

## 2024-01-13 DIAGNOSIS — F79 Unspecified intellectual disabilities: Secondary | ICD-10-CM

## 2024-01-13 DIAGNOSIS — E785 Hyperlipidemia, unspecified: Secondary | ICD-10-CM | POA: Diagnosis not present

## 2024-01-13 DIAGNOSIS — E559 Vitamin D deficiency, unspecified: Secondary | ICD-10-CM | POA: Diagnosis not present

## 2024-01-13 DIAGNOSIS — Z85038 Personal history of other malignant neoplasm of large intestine: Secondary | ICD-10-CM

## 2024-01-13 DIAGNOSIS — J302 Other seasonal allergic rhinitis: Secondary | ICD-10-CM

## 2024-01-13 DIAGNOSIS — Z9049 Acquired absence of other specified parts of digestive tract: Secondary | ICD-10-CM

## 2024-01-13 MED ORDER — PHENOBARBITAL 64.8 MG PO TABS: 64.8000 mg | ORAL_TABLET | Freq: Two times a day (BID) | ORAL | 1 refills | Status: DC

## 2024-01-13 NOTE — Progress Notes (Signed)
 Name: Alec Campbell   MRN: [id number]    DOB: 1971-04-25   Date:01/13/2024       Progress Note  Subjective  Chief Complaint  Chief Complaint  Patient presents with   Medical Management of Chronic Issues   HPI   Intellectual disability with epilepsy:  he lives with his parents, parents dispense his medications he also needs assistance with transportation, he is unable to manage his finances. Parents also need to be present during his office visits.   He developed seizures at 16 weeks of age , he was born with cleft lips and palate. He was diagnosed with developmental delay as a toddler. He is able to do ADL, but not able to do Instrumental activities of daily living , he does recognizes his medications but parents have to remind him when to take it . He is cooperative and doing well Stable    Anemia due to blood loss after right hemicolectomy: we will recheck labs   Vitamin D deficiency: he has not been taking supplementation   AR: he does not like nasal sprays, advised to try loratadine otc    Seizure disorder: no symptoms in many years, mother is terrified of stopping his medication because his symptoms were severe. He has a history of  hyponatremia, he saw nephrologist for a period of time, but last sodium was back to normal.   He is now on only two pill of phenobarbital and 4 pills of carbamazepine daily without any side effects. We will recheck labs    Dyslipidemia: he is now taking statin therapy, no side effects of medications, continue medications Last LDL was 88. Family history of heart disease and dyslipidemia . He is due for labs   Constipation idiopathic: bowel movements three times a week, taking miralax prn, even after partial colectomy he denies dumping syndrome. No change in bowel movement    History of partial colectomy for treatment of a colon mass that was found to be adenocarcinoma - April 2024. He is due for repeat colonoscopy as recommended by Dr. Everlene Farrier    Pathology  report: 01/2023  MODERATELY DIFFERENTIATED ADENOCARCINOMA, INVASIVE INTO SUBMUCOSA.     Patient Active Problem List   Diagnosis Date Noted   Colon adenocarcinoma (HCC) 03/10/2023   Hypophosphatemia 02/22/2023   AKI (acute kidney injury) (HCC) 02/21/2023   Hypokalemia 02/21/2023   Diarrhea 02/21/2023   Multifocal pneumonia 02/20/2023   Septic shock (HCC) 02/19/2023   SBO (small bowel obstruction) (HCC) 02/19/2023   S/P partial resection of colon 02/10/2023   Calculus of gallbladder with chronic cholecystitis without obstruction 02/10/2023   Colonic mass 01/13/2023   Gastritis, Helicobacter pylori 01/02/2023   Colon polyps 01/02/2023   Absolute anemia 12/30/2022   Adenomatous polyp of colon 12/30/2022   History of Helicobacter pylori infection 12/10/2022   History of anemia 12/10/2022   Inguinal hernia with gangrene, recurrent bilateral 06/17/2020   Cold sore 02/13/2016   Vitamin D deficiency 07/20/2015   Intellectual disability with epilepsy (HCC) 07/16/2015   Seizure disorder (HCC) 07/16/2015   Seasonal allergic rhinitis 07/16/2015   Dyslipidemia 07/16/2015   Hyponatremia 06/13/2015   Abnormal blood sugar 06/19/2008    Past Surgical History:  Procedure Laterality Date   ANKLE SURGERY Right    CHOLECYSTECTOMY N/A 02/10/2023   Procedure: LAPAROSCOPIC CHOLECYSTECTOMY;  Surgeon: Leafy Ro, MD;  Location: [facility] ORS;  Service: General;  Laterality: N/A;   CLEFT LIP REPAIR     COLONOSCOPY N/A 12/30/2022   Procedure: COLONOSCOPY;  Surgeon: Wyline Mood, MD;  Location: [facility] ENDOSCOPY;  Service: Gastroenterology;  Laterality: N/A;  SPECIAL NEEDS - MOTHER NEEDS TO ACCOMPANY HIM.   ESOPHAGOGASTRODUODENOSCOPY (EGD) WITH PROPOFOL N/A 12/30/2022   Procedure: ESOPHAGOGASTRODUODENOSCOPY (EGD) WITH PROPOFOL;  Surgeon: Wyline Mood, MD;  Location: [facility] ENDOSCOPY;  Service: Gastroenterology;  Laterality: N/A;   HERNIA REPAIR     Inguinal   LAPAROSCOPIC RIGHT COLECTOMY N/A 02/10/2023    Procedure: LAPAROSCOPIC RIGHT COLECTOMY, hand assisted, RNFA to assist;  Surgeon: Leafy Ro, MD;  Location: [facility] ORS;  Service: General;  Laterality: N/A;   PALATE SURGERY     TYMPANOSTOMY TUBE PLACEMENT      Family History  Problem Relation Age of Onset   Hypertension Mother    Thyroid disease Mother    CAD Father    Hypertension Father    Hyperlipidemia Father    Healthy Brother    Healthy Sister    Diabetes Brother    COPD Brother    Emphysema Brother    Alcohol abuse Brother    Hypertension Brother    Aneurysm Brother        brain    Social History   Tobacco Use   Smoking status: Never    Passive exposure: Never   Smokeless tobacco: Never  Substance Use Topics   Alcohol use: No    Alcohol/week: 0.0 standard drinks of alcohol     Current Outpatient Medications:    carbamazepine (TEGRETOL) 200 MG tablet, TAKE 2 TABLETS BY MOUTH IN THE MORNING, THEN TAKE  1 TABLET AT LUNCH AND TAKE 1 AT NIGHT, Disp: 360 tablet, Rfl: 2   chlorpheniramine (CHLOR-TRIMETON) 4 MG tablet, Take 4 mg by mouth 2 (two) times daily as needed for allergies., Disp: , Rfl:    Multiple Vitamin (MULTIVITAMIN WITH MINERALS) TABS tablet, Take 1 tablet by mouth daily., Disp: , Rfl:    PHENobarbital (LUMINAL) 64.8 MG tablet, Take 1 tablet by mouth twice daily, Disp: 180 tablet, Rfl: 0   PREVIDENT 5000 SENSITIVE 1.1-5 % GEL, Place onto teeth 2 (two) times daily., Disp: , Rfl:    rosuvastatin (CRESTOR) 10 MG tablet, Take 1 tablet (10 mg total) by mouth daily., Disp: 90 tablet, Rfl: 2   valACYclovir (VALTREX) 1000 MG tablet, TAKE 1 TABLET BY MOUTH TWICE DAILY AS NEEDED FOR  OUTBREAK, Disp: 30 tablet, Rfl: 0  No Known Allergies  I personally reviewed active problem list, medication list, allergies, family history with the patient/caregiver today.   ROS  Ten systems reviewed and is negative except as mentioned in HPI    Objective  Vitals:   01/13/24 1100  BP: 132/80  Pulse: 78  Resp: 16   SpO2: 96%  Weight: 173 lb (78.5 kg)  Height: 5\' 7"  (1.702 m)    Body mass index is 27.1 kg/m.  Physical Exam  Constitutional: Patient appears well-developed and well-nourished.  No distress.  HEENT: head atraumatic, normocephalic, pupils equal and reactive to light, neck supple history of cleft palate repair  Cardiovascular: Normal rate, regular rhythm and normal heart sounds.  No murmur heard. No BLE edema. Pulmonary/Chest: Effort normal and breath sounds normal. No respiratory distress. Abdominal: Soft.  There is no tenderness. Psychiatric: Patient has a normal mood and affect. behavior is normal. Judgment and thought content normal.   Diabetic Foot Exam:     PHQ2/9:    01/13/2024   10:59 AM 08/23/2023   10:59 AM 07/14/2023    3:01 PM 06/10/2023   10:32 AM 01/13/2023  11:09 AM  Depression screen PHQ 2/9  Decreased Interest 0 0 0 0 0  Down, Depressed, Hopeless 0 0 0 0 0  PHQ - 2 Score 0 0 0 0 0  Altered sleeping 0 0  0   Tired, decreased energy 0 0  0   Change in appetite 0 0  0   Feeling bad or failure about yourself  0 0  0   Trouble concentrating 0 0  0   Moving slowly or fidgety/restless 0 0  0   Suicidal thoughts 0 0  0   PHQ-9 Score 0 0  0   Difficult doing work/chores Not difficult at all Not difficult at all       phq 9 is negative  Fall Risk:    08/23/2023   10:59 AM 07/14/2023    2:59 PM 06/10/2023   10:31 AM 03/08/2023   10:29 AM 01/31/2023    2:01 PM  Fall Risk   Falls in the past year? 0 0 0 0 0  Number falls in past yr: 0 0 0    Injury with Fall? 0 0 0    Risk for fall due to : No Fall Risks No Fall Risks No Fall Risks No Fall Risks   Follow up Falls prevention discussed;Education provided;Falls evaluation completed Education provided;Falls prevention discussed Falls prevention discussed Falls prevention discussed      Assessment & Plan  1. Seizure disorder (HCC) (Primary)  No recent episodes of seizure - Phenobarbital level - Carbamazepine  Level (Tegretol), total  2. Intellectual disability with epilepsy (HCC)  Stable, lives with parents  3. Dyslipidemia  - Lipid panel  4. Vitamin D deficiency  - VITAMIN D 25 Hydroxy (Vit-D Deficiency, Fractures)  5. S/P partial resection of colon  For colon cancer treatment  6. History of colon cancer in adulthood  Reached out to Dr. Everlene Farrier to see when he is due for repeat colonoscopy   7. Seasonal allergic rhinitis, unspecified trigger  Continue medications otc  8. Chronic idiopathic constipation  stable  9. Impingement of right shoulder  - Ambulatory referral to Orthopedic Surgery  10. Long-term use of high-risk medication  - CBC with Differential/Platelet - COMPLETE METABOLIC PANEL WITH GFR - Phenobarbital level - Carbamazepine Level (Tegretol), total

## 2024-01-14 LAB — COMPLETE METABOLIC PANEL WITH GFR
AG Ratio: 1.8 (calc) (ref 1.0–2.5)
ALT: 12 U/L (ref 9–46)
AST: 15 U/L (ref 10–35)
Albumin: 4.4 g/dL (ref 3.6–5.1)
Alkaline phosphatase (APISO): 110 U/L (ref 35–144)
BUN: 18 mg/dL (ref 7–25)
CO2: 29 mmol/L (ref 20–32)
Calcium: 9.5 mg/dL (ref 8.6–10.3)
Chloride: 101 mmol/L (ref 98–110)
Creat: 0.87 mg/dL (ref 0.70–1.30)
Globulin: 2.5 g/dL (ref 1.9–3.7)
Glucose, Bld: 77 mg/dL (ref 65–99)
Potassium: 4.1 mmol/L (ref 3.5–5.3)
Sodium: 137 mmol/L (ref 135–146)
Total Bilirubin: 0.3 mg/dL (ref 0.2–1.2)
Total Protein: 6.9 g/dL (ref 6.1–8.1)
eGFR: 103 mL/min/{1.73_m2} (ref >=60)

## 2024-01-14 LAB — LIPID PANEL
Cholesterol: 187 mg/dL (ref <200)
HDL: 46 mg/dL (ref >=40)
LDL Cholesterol (Calc): 105 mg/dL — ABNORMAL HIGH
Non-HDL Cholesterol (Calc): 141 mg/dL — ABNORMAL HIGH (ref <130)
Total CHOL/HDL Ratio: 4.1 (calc) (ref <5.0)
Triglycerides: 253 mg/dL — ABNORMAL HIGH (ref <150)

## 2024-01-14 LAB — CBC WITH DIFFERENTIAL/PLATELET
Absolute Lymphocytes: 2288 {cells}/uL (ref 850–3900)
Absolute Monocytes: 494 {cells}/uL (ref 200–950)
Basophils Absolute: 99 {cells}/uL (ref 0–200)
Basophils Relative: 1.3 %
Eosinophils Absolute: 562 {cells}/uL — ABNORMAL HIGH (ref 15–500)
Eosinophils Relative: 7.4 %
HCT: 38.7 % (ref 38.5–50.0)
Hemoglobin: 12.9 g/dL — ABNORMAL LOW (ref 13.2–17.1)
MCH: 30 pg (ref 27.0–33.0)
MCHC: 33.3 g/dL (ref 32.0–36.0)
MCV: 90 fL (ref 80.0–100.0)
MPV: 13.2 fL — ABNORMAL HIGH (ref 7.5–12.5)
Monocytes Relative: 6.5 %
Neutro Abs: 4157 {cells}/uL (ref 1500–7800)
Neutrophils Relative %: 54.7 %
Platelets: 157 10*3/uL (ref 140–400)
RBC: 4.3 10*6/uL (ref 4.20–5.80)
RDW: 12.4 % (ref 11.0–15.0)
Total Lymphocyte: 30.1 %
WBC: 7.6 10*3/uL (ref 3.8–10.8)

## 2024-01-14 LAB — CARBAMAZEPINE LEVEL, TOTAL: Carbamazepine Lvl: 8.1 mg/L (ref 4.0–12.0)

## 2024-01-14 LAB — PHENOBARBITAL LEVEL: Phenobarbital, Serum: 23.8 mg/L (ref 15.0–40.0)

## 2024-01-14 LAB — VITAMIN D 25 HYDROXY (VIT D DEFICIENCY, FRACTURES): Vit D, 25-Hydroxy: 33 ng/mL (ref 30–100)

## 2024-01-16 ENCOUNTER — Encounter: Payer: Self-pay | Admitting: Family Medicine

## 2024-01-19 ENCOUNTER — Other Ambulatory Visit: Payer: Self-pay

## 2024-01-19 ENCOUNTER — Telehealth: Payer: Self-pay | Admitting: Gastroenterology

## 2024-01-19 ENCOUNTER — Telehealth: Payer: Self-pay

## 2024-01-19 DIAGNOSIS — Z9049 Acquired absence of other specified parts of digestive tract: Secondary | ICD-10-CM

## 2024-01-19 DIAGNOSIS — Z85038 Personal history of other malignant neoplasm of large intestine: Secondary | ICD-10-CM

## 2024-01-19 MED ORDER — SUTAB 1479-225-188 MG PO TABS: 12.0000 | ORAL_TABLET | Freq: Two times a day (BID) | ORAL | 0 refills | Status: AC

## 2024-01-19 NOTE — Telephone Encounter (Signed)
 Gastroenterology Pre-Procedure Review  Request Date: 02/07/24 Requesting Physician: Dr. Tobi Bastos  PATIENT REVIEW QUESTIONS: The patient responded to the following health history questions as indicated:    1. Are you having any GI issues? no 2. Do you have a personal history of Polyps? yes (history of colon cancer last colonoscopy performed by Dr. Tobi Bastos 12/30/22) 3. Do you have a family history of Colon Cancer or Polyps? no 4. Diabetes Mellitus? no 5. Joint replacements in the past 12 months?no 6. Major health problems in the past 3 months?no 7. Any artificial heart valves, MVP, or defibrillator?no    MEDICATIONS & ALLERGIES:    Patient reports the following regarding taking any anticoagulation/antiplatelet therapy:   Plavix, Coumadin, Eliquis, Xarelto, Lovenox, Pradaxa, Brilinta, or Effient? no Aspirin? no  Patient confirms/reports the following medications:  Current Outpatient Medications  Medication Sig Dispense Refill   carbamazepine (TEGRETOL) 200 MG tablet TAKE 2 TABLETS BY MOUTH IN THE MORNING, THEN TAKE  1 TABLET AT LUNCH AND TAKE 1 AT NIGHT 360 tablet 2   Multiple Vitamin (MULTIVITAMIN WITH MINERALS) TABS tablet Take 1 tablet by mouth daily.     PHENobarbital (LUMINAL) 64.8 MG tablet Take 1 tablet (64.8 mg total) by mouth 2 (two) times daily. 180 tablet 1   PREVIDENT 5000 SENSITIVE 1.1-5 % GEL Place onto teeth 2 (two) times daily.     rosuvastatin (CRESTOR) 10 MG tablet Take 1 tablet (10 mg total) by mouth daily. 90 tablet 2   valACYclovir (VALTREX) 1000 MG tablet TAKE 1 TABLET BY MOUTH TWICE DAILY AS NEEDED FOR  OUTBREAK 30 tablet 0   No current facility-administered medications for this visit.    Patient confirms/reports the following allergies:  No Known Allergies  No orders of the defined types were placed in this encounter.   AUTHORIZATION INFORMATION Primary Insurance: 1D#: Group #:  Secondary Insurance: 1D#: Group #:  SCHEDULE INFORMATION: Date:  02/07/24 Time: Location: [facility]

## 2024-01-19 NOTE — Telephone Encounter (Signed)
 The patient mother Alec Campbell) called back to speak with Marcelino Duster.

## 2024-02-06 ENCOUNTER — Encounter: Payer: Self-pay | Admitting: Gastroenterology

## 2024-02-07 ENCOUNTER — Encounter: Payer: Self-pay | Admitting: Gastroenterology

## 2024-02-07 ENCOUNTER — Ambulatory Visit: Admitting: Anesthesiology

## 2024-02-07 ENCOUNTER — Ambulatory Visit
Admission: RE | Admit: 2024-02-07 | Discharge: 2024-02-07 | Disposition: A | Discharge status: Home / Self Care | Source: Ambulatory Visit | Attending: Gastroenterology | Admitting: Gastroenterology

## 2024-02-07 ENCOUNTER — Encounter
Admission: RE | Disposition: A | Discharge status: Home / Self Care | Payer: Self-pay | Source: Ambulatory Visit | Attending: Gastroenterology

## 2024-02-07 DIAGNOSIS — Z1211 Encounter for screening for malignant neoplasm of colon: Secondary | ICD-10-CM | POA: Diagnosis present

## 2024-02-07 DIAGNOSIS — D125 Benign neoplasm of sigmoid colon: Secondary | ICD-10-CM | POA: Insufficient documentation

## 2024-02-07 DIAGNOSIS — G40909 Epilepsy, unspecified, not intractable, without status epilepticus: Secondary | ICD-10-CM | POA: Diagnosis not present

## 2024-02-07 DIAGNOSIS — Z85038 Personal history of other malignant neoplasm of large intestine: Secondary | ICD-10-CM | POA: Diagnosis not present

## 2024-02-07 DIAGNOSIS — D122 Benign neoplasm of ascending colon: Secondary | ICD-10-CM | POA: Insufficient documentation

## 2024-02-07 DIAGNOSIS — Z9049 Acquired absence of other specified parts of digestive tract: Secondary | ICD-10-CM

## 2024-02-07 HISTORY — PX: POLYPECTOMY: SHX149

## 2024-02-07 HISTORY — PX: COLONOSCOPY: SHX5424

## 2024-02-07 SURGERY — COLONOSCOPY
Anesthesia: General

## 2024-02-07 MED ORDER — SODIUM CHLORIDE 0.9 % IV SOLN: INTRAVENOUS | Status: DC

## 2024-02-07 MED ORDER — PROPOFOL 10 MG/ML IV BOLUS
INTRAVENOUS | Status: AC
  Filled 2024-02-07: qty 20

## 2024-02-07 MED ORDER — PROPOFOL 500 MG/50ML IV EMUL
INTRAVENOUS | Status: DC | PRN
  Administered 2024-02-07: 120 ug/kg/min via INTRAVENOUS

## 2024-02-07 MED ORDER — EPHEDRINE SULFATE (PRESSORS) 50 MG/ML IJ SOLN
INTRAMUSCULAR | Status: DC | PRN
Start: 2024-02-07 — End: 2024-02-07
  Administered 2024-02-07: 10 mg via INTRAVENOUS

## 2024-02-07 MED ORDER — PROPOFOL 500 MG/50ML IV EMUL: INTRAVENOUS | Status: DC | PRN

## 2024-02-07 MED ORDER — PROPOFOL 10 MG/ML IV BOLUS
INTRAVENOUS | Status: DC | PRN
  Administered 2024-02-07: 90 mg via INTRAVENOUS

## 2024-02-07 MED ORDER — LIDOCAINE HCL (CARDIAC) PF 100 MG/5ML IV SOSY
PREFILLED_SYRINGE | INTRAVENOUS | Status: DC | PRN
  Administered 2024-02-07: 80 mg via INTRAVENOUS

## 2024-02-07 NOTE — Op Note (Signed)
 [facility] Gastroenterology Patient Name: Alec Campbell Procedure Date: 02/07/2024 11:24 AM MRN: [id number] Account #: [id number] Date of Birth: June 16, 1971 Admit Type: Outpatient Age: 53 Room: [facility] ENDO ROOM 2 Gender: Male Note Status: Finalized Instrument Name: Peds Colonoscope [id number] Procedure:             Colonoscopy Indications:           High risk colon cancer surveillance: Personal history                         of colon cancer, Last colonoscopy: February 2024 Providers:             Wyline Mood MD, MD Referring MD:          Onnie Boer. Sowles, MD (Referring MD) Medicines:             Monitored Anesthesia Care Complications:         No immediate complications. Procedure:             Pre-Anesthesia Assessment:                        - Prior to the procedure, a History and Physical was                         performed, and patient medications, allergies and                         sensitivities were reviewed. The patient's tolerance                         of previous anesthesia was reviewed.                        - The risks and benefits of the procedure and the                         sedation options and risks were discussed with the                         patient. All questions were answered and informed                         consent was obtained.                        - ASA Grade Assessment: II - A patient with mild                         systemic disease.                        After obtaining informed consent, the colonoscope was                         passed under direct vision. Throughout the procedure,                         the patient's blood pressure, pulse, and oxygen  saturations were monitored continuously. The                         Colonoscope was introduced through the anus and                         advanced to the the ileocolonic anastomosis. The                         colonoscopy was performed with ease.  The patient                         tolerated the procedure well. The quality of the bowel                         preparation was excellent. The ileocecal valve,                         appendiceal orifice, and rectum were photographed. Findings:      The perianal and digital rectal examinations were normal.      Two sessile polyps were found in the ascending colon. The polyps were 4       to 5 mm in size. These polyps were removed with a cold snare. Resection       and retrieval were complete.      A 5 mm polyp was found in the sigmoid colon. The polyp was sessile. The       polyp was removed with a cold snare. Resection and retrieval were       complete.      There was evidence of a prior end-to-end colo-colonic anastomosis in the       ascending colon. This was patent and was characterized by healthy       appearing mucosa.      The exam was otherwise without abnormality on direct and retroflexion       views. Impression:            - Two 4 to 5 mm polyps in the ascending colon, removed                         with a cold snare. Resected and retrieved.                        - One 5 mm polyp in the sigmoid colon, removed with a                         cold snare. Resected and retrieved.                        - Patent end-to-end colo-colonic anastomosis,                         characterized by healthy appearing mucosa.                        - The examination was otherwise normal on direct and                         retroflexion views. Recommendation:        -  Discharge patient to home (with escort).                        - Resume previous diet.                        - Continue present medications.                        - Await pathology results.                        - Repeat colonoscopy in 3 years for surveillance. Procedure Code(s):     --- Professional ---                        [phone], Colonoscopy, flexible; with removal of                         tumor(s), polyp(s), or  other lesion(s) by snare                         technique Diagnosis Code(s):     --- Professional ---                        D12.2, Benign neoplasm of ascending colon                        Z85.038, Personal history of other malignant neoplasm                         of large intestine                        D12.5, Benign neoplasm of sigmoid colon                        Z98.0, Intestinal bypass and anastomosis status CPT copyright 2022 American Medical Association. All rights reserved. The codes documented in this report are preliminary and upon coder review may  be revised to meet current compliance requirements. Wyline Mood, MD Wyline Mood MD, MD 02/07/2024 11:46:02 AM This report has been signed electronically. Number of Addenda: 0 Note Initiated On: 02/07/2024 11:24 AM Scope Withdrawal Time: 0 hours 6 minutes 58 seconds  Total Procedure Duration: 0 hours 9 minutes 36 seconds  Estimated Blood Loss:  Estimated blood loss: none.      [facility]

## 2024-02-07 NOTE — H&P (Signed)
 Wyline Mood, MD [address], [place], [place], [address] [address], [place], [place], [address] Phone: [phone]  Fax: [phone]  Primary Care Physician:  Alba Cory, MD   Pre-Procedure History & Physical: HPI:  Alec Campbell is a 53 y.o. male is here for an colonoscopy.   Past Medical History:  Diagnosis Date   Allergy    Family history of colon cancer 03/10/2023   Moderate mental retardation    Seizures (HCC)    Vitamin D deficiency     Past Surgical History:  Procedure Laterality Date   ANKLE SURGERY Right    CHOLECYSTECTOMY N/A 02/10/2023   Procedure: LAPAROSCOPIC CHOLECYSTECTOMY;  Surgeon: Leafy Ro, MD;  Location: [facility] ORS;  Service: General;  Laterality: N/A;   CLEFT LIP REPAIR     COLONOSCOPY N/A 12/30/2022   Procedure: COLONOSCOPY;  Surgeon: Wyline Mood, MD;  Location: [facility] ENDOSCOPY;  Service: Gastroenterology;  Laterality: N/A;  SPECIAL NEEDS - MOTHER NEEDS TO ACCOMPANY HIM.   ESOPHAGOGASTRODUODENOSCOPY (EGD) WITH PROPOFOL N/A 12/30/2022   Procedure: ESOPHAGOGASTRODUODENOSCOPY (EGD) WITH PROPOFOL;  Surgeon: Wyline Mood, MD;  Location: [facility] ENDOSCOPY;  Service: Gastroenterology;  Laterality: N/A;   HERNIA REPAIR     Inguinal   LAPAROSCOPIC RIGHT COLECTOMY N/A 02/10/2023   Procedure: LAPAROSCOPIC RIGHT COLECTOMY, hand assisted, RNFA to assist;  Surgeon: Leafy Ro, MD;  Location: [facility] ORS;  Service: General;  Laterality: N/A;   PALATE SURGERY     TYMPANOSTOMY TUBE PLACEMENT      Prior to Admission medications   Medication Sig Start Date End Date Taking? Authorizing Provider  carbamazepine (TEGRETOL) 200 MG tablet TAKE 2 TABLETS BY MOUTH IN THE MORNING, THEN TAKE  1 TABLET AT LUNCH AND TAKE 1 AT NIGHT 06/10/23  Yes Sowles, Danna Hefty, MD  PHENobarbital (LUMINAL) 64.8 MG tablet Take 1 tablet (64.8 mg total) by mouth 2 (two) times daily. 01/13/24  Yes Sowles, Danna Hefty, MD  Multiple Vitamin (MULTIVITAMIN WITH MINERALS) TABS tablet  Take 1 tablet by mouth daily. 03/04/23   Sunnie Nielsen, DO  PREVIDENT 5000 SENSITIVE 1.1-5 % GEL Place onto teeth 2 (two) times daily. 05/11/21   [provider]  rosuvastatin (CRESTOR) 10 MG tablet Take 1 tablet (10 mg total) by mouth daily. 06/10/23   Alba Cory, MD  valACYclovir (VALTREX) 1000 MG tablet TAKE 1 TABLET BY MOUTH TWICE DAILY AS NEEDED FOR  OUTBREAK 06/10/23   Alba Cory, MD    Allergies as of 01/19/2024   (No Known Allergies)    Family History  Problem Relation Age of Onset   Hypertension Mother    Thyroid disease Mother    CAD Father    Hypertension Father    Hyperlipidemia Father    Healthy Brother    Healthy Sister    Diabetes Brother    COPD Brother    Emphysema Brother    Alcohol abuse Brother    Hypertension Brother    Aneurysm Brother        brain    Social History   Socioeconomic History   Marital status: Single    Spouse name: Not on file   Number of children: 0   Years of education: Handicapped diploma   Highest education level: Not on file  Occupational History    Employer: DISABLED  Tobacco Use   Smoking status: Never    Passive exposure: Never   Smokeless tobacco: Never  Vaping Use   Vaping status: Never Used  Substance and Sexual Activity  Alcohol use: No    Alcohol/week: 0.0 standard drinks of alcohol   Drug use: No   Sexual activity: Never  Other Topics Concern   Not on file  Social History Narrative   Moderate mental retardation. Parents are the primary caregiver, he lives with them   Social Drivers of Health   Financial Resource Strain: Low Risk  (01/31/2024)   Received from [facility]   Overall Financial Resource Strain (CARDIA)    Difficulty of Paying Living Expenses: Not very hard  Food Insecurity: Food Insecurity Present (01/31/2024)   Received from [facility]   Hunger Vital Sign    Worried About Running Out of Food in the Last Year: Sometimes true    Ran Out of  Food in the Last Year: Sometimes true  Transportation Needs: No Transportation Needs (01/31/2024)   Received from [facility] - Transportation    In the past 12 months, has lack of transportation kept you from medical appointments or from getting medications?: No    Lack of Transportation (Non-Medical): No  Physical Activity: Inactive (07/14/2023)   Exercise Vital Sign    Days of Exercise per Week: 7 days    Minutes of Exercise per Session: 0 min  Stress: No Stress Concern Present (07/14/2023)   [employer] of Occupational Health - Occupational Stress Questionnaire    Feeling of Stress : Not at all  Social Connections: Socially Isolated (07/14/2023)   Social Connection and Isolation Panel [NHANES]    Frequency of Communication with Friends and Family: Twice a week    Frequency of Social Gatherings with Friends and Family: Once a week    Attends Religious Services: Never    Database administrator or Organizations: No    Attends Banker Meetings: Never    Marital Status: Never married  Intimate Partner Violence: Not At Risk (07/14/2023)   Humiliation, Afraid, Rape, and Kick questionnaire    Fear of Current or Ex-Partner: No    Emotionally Abused: No    Physically Abused: No    Sexually Abused: No    Review of Systems: See HPI, otherwise negative ROS  Physical Exam: BP (!) 157/99   Pulse 82   Temp (!) 95.8 F (35.4 C) (Temporal)   Resp 18   Ht 5\' 7"  (1.702 m)   Wt 77.1 kg   SpO2 100%   BMI 26.63 kg/m  General:   Alert,  pleasant and cooperative in NAD Head:  Normocephalic and atraumatic. Neck:  Supple; no masses or thyromegaly. Lungs:  Clear throughout to auscultation, normal respiratory effort.    Heart:  +S1, +S2, Regular rate and rhythm, No edema. Abdomen:  Soft, nontender and nondistended. Normal bowel sounds, without guarding, and without rebound.   Neurologic:  Alert and  oriented x4;  grossly normal  neurologically.  Impression/Plan: Alec Campbell is here for an colonoscopy to be performed for surveillance due to prior history of colon cancer  Risks, benefits, limitations, and alternatives regarding  colonoscopy have been reviewed with the patient.  Questions have been answered.  All parties agreeable.   Wyline Mood, MD  02/07/2024, 11:17 AM

## 2024-02-07 NOTE — Anesthesia Postprocedure Evaluation (Signed)
 Anesthesia Post Note  Patient: Alec Campbell  Procedure(s) Performed: COLONOSCOPY POLYPECTOMY, INTESTINE  Patient location during evaluation: Endoscopy Anesthesia Type: General Level of consciousness: awake and alert Pain management: pain level controlled Vital Signs Assessment: post-procedure vital signs reviewed and stable Respiratory status: spontaneous breathing, nonlabored ventilation, respiratory function stable and patient connected to nasal cannula oxygen Cardiovascular status: blood pressure returned to baseline and stable Postop Assessment: no apparent nausea or vomiting Anesthetic complications: no   No notable events documented.   Last Vitals:  Vitals:   02/07/24 1154 02/07/24 1155  BP: 106/65 (!) 94/56  Pulse: 85 78  Resp: 18 18  Temp:    SpO2: 98% 98%    Last Pain:  Vitals:   02/07/24 1155  TempSrc:   PainSc: 0-No pain                 Cleda Mccreedy Georges Victorio

## 2024-02-07 NOTE — Transfer of Care (Signed)
 Immediate Anesthesia Transfer of Care Note  Patient: Alec Campbell  Procedure(s) Performed: COLONOSCOPY POLYPECTOMY, INTESTINE  Patient Location: Endoscopy Unit  Anesthesia Type:General  Level of Consciousness: drowsy  Airway & Oxygen Therapy: Patient Spontanous Breathing  Post-op Assessment: Report given to RN and Post -op Vital signs reviewed and stable  Post vital signs: Reviewed and stable  Last Vitals:  Vitals Value Taken Time  BP 89/63 02/07/24 1144  Temp 36 C 02/07/24 1144  Pulse 66 02/07/24 1149  Resp 15 02/07/24 1149  SpO2 97 % 02/07/24 1149  Vitals shown include unfiled device data.  Last Pain:  Vitals:   02/07/24 1144  TempSrc: Temporal  PainSc: Asleep         Complications: No notable events documented.

## 2024-02-07 NOTE — Anesthesia Preprocedure Evaluation (Signed)
 Anesthesia Evaluation  Patient identified by MRN, date of birth, ID band Patient awake    Reviewed: Allergy & Precautions, NPO status , Patient's Chart, lab work & pertinent test results  History of Anesthesia Complications Negative for: history of anesthetic complications  Airway Mallampati: III  TM Distance: <3 FB Neck ROM: full    Dental  (+) Chipped, Poor Dentition, Missing   Pulmonary neg pulmonary ROS, neg shortness of breath   Pulmonary exam normal        Cardiovascular negative cardio ROS Normal cardiovascular exam     Neuro/Psych Seizures -, Well Controlled,   negative psych ROS   GI/Hepatic negative GI ROS, Neg liver ROS,,,  Endo/Other  negative endocrine ROS    Renal/GU negative Renal ROS  negative genitourinary   Musculoskeletal   Abdominal   Peds  Hematology negative hematology ROS (+)   Anesthesia Other Findings Past Medical History: No date: Allergy 03/10/2023: Family history of colon cancer No date: Moderate mental retardation No date: Seizures (HCC) No date: Vitamin D deficiency  Past Surgical History: No date: ANKLE SURGERY; Right 02/10/2023: CHOLECYSTECTOMY; N/A     Comment:  Procedure: LAPAROSCOPIC CHOLECYSTECTOMY;  Surgeon:               Leafy Ro, MD;  Location: [facility] ORS;  Service:               General;  Laterality: N/A; No date: CLEFT LIP REPAIR 12/30/2022: COLONOSCOPY; N/A     Comment:  Procedure: COLONOSCOPY;  Surgeon: Wyline Mood, MD;                Location: [facility] ENDOSCOPY;  Service: Gastroenterology;                Laterality: N/A;  SPECIAL NEEDS - MOTHER NEEDS TO               ACCOMPANY HIM. 12/30/2022: ESOPHAGOGASTRODUODENOSCOPY (EGD) WITH PROPOFOL; N/A     Comment:  Procedure: ESOPHAGOGASTRODUODENOSCOPY (EGD) WITH               PROPOFOL;  Surgeon: Wyline Mood, MD;  Location: [facility]               ENDOSCOPY;  Service: Gastroenterology;  Laterality: N/A; No date: HERNIA  REPAIR     Comment:  Inguinal 02/10/2023: LAPAROSCOPIC RIGHT COLECTOMY; N/A     Comment:  Procedure: LAPAROSCOPIC RIGHT COLECTOMY, hand assisted,               RNFA to assist;  Surgeon: Leafy Ro, MD;  Location:               [facility] ORS;  Service: General;  Laterality: N/A; No date: PALATE SURGERY No date: TYMPANOSTOMY TUBE PLACEMENT     Reproductive/Obstetrics negative OB ROS                             Anesthesia Physical Anesthesia Plan  ASA: 2  Anesthesia Plan: General   Post-op Pain Management:    Induction: Intravenous  PONV Risk Score and Plan: Propofol infusion and TIVA  Airway Management Planned: Natural Airway and Nasal Cannula  Additional Equipment:   Intra-op Plan:   Post-operative Plan:   Informed Consent: I have reviewed the patients History and Physical, chart, labs and discussed the procedure including the risks, benefits and alternatives for the proposed anesthesia with the patient or authorized representative who has indicated his/her understanding and acceptance.  Dental Advisory Given  Plan Discussed with: Anesthesiologist, CRNA and Surgeon  Anesthesia Plan Comments: (Mother consented for risks of anesthesia including but not limited to:  - adverse reactions to medications - risk of airway placement if required - damage to eyes, teeth, lips or other oral mucosa - nerve damage due to positioning  - sore throat or hoarseness - Damage to heart, brain, nerves, lungs, other parts of body or loss of life  She voiced understanding and assent.)       Anesthesia Quick Evaluation

## 2024-02-08 ENCOUNTER — Encounter: Payer: Self-pay | Admitting: Gastroenterology

## 2024-02-08 LAB — SURGICAL PATHOLOGY

## 2024-02-09 ENCOUNTER — Encounter: Payer: Self-pay | Admitting: Gastroenterology

## 2024-02-29 ENCOUNTER — Other Ambulatory Visit: Payer: Self-pay | Admitting: Family Medicine

## 2024-02-29 DIAGNOSIS — E785 Hyperlipidemia, unspecified: Secondary | ICD-10-CM

## 2024-03-15 ENCOUNTER — Other Ambulatory Visit: Payer: Self-pay | Admitting: Family Medicine

## 2024-03-15 DIAGNOSIS — G40909 Epilepsy, unspecified, not intractable, without status epilepticus: Secondary | ICD-10-CM

## 2024-03-15 DIAGNOSIS — F79 Unspecified intellectual disabilities: Secondary | ICD-10-CM

## 2024-06-14 ENCOUNTER — Other Ambulatory Visit: Payer: Self-pay | Admitting: Family Medicine

## 2024-06-14 DIAGNOSIS — G40909 Epilepsy, unspecified, not intractable, without status epilepticus: Secondary | ICD-10-CM

## 2024-07-19 ENCOUNTER — Ambulatory Visit: Payer: Self-pay

## 2024-07-20 ENCOUNTER — Ambulatory Visit (INDEPENDENT_AMBULATORY_CARE_PROVIDER_SITE_OTHER)

## 2024-07-20 VITALS — Ht 67.0 in | Wt 170.0 lb

## 2024-07-20 DIAGNOSIS — Z0000 Encounter for general adult medical examination without abnormal findings: Secondary | ICD-10-CM | POA: Diagnosis not present

## 2024-07-20 NOTE — Patient Instructions (Addendum)
 Mr. Alec Campbell,  Thank you for taking the time for your Medicare Wellness Visit. I appreciate your continued commitment to your health goals. Please review the care plan we discussed, and feel free to reach out if I can assist you further.  Medicare recommends these wellness visits once per year to help you and your care team stay ahead of potential health issues. These visits are designed to focus on prevention, allowing your provider to concentrate on managing your acute and chronic conditions during your regular appointments.  Please note that Annual Wellness Visits do not include a physical exam. Some assessments may be limited, especially if the visit was conducted virtually. If needed, we may recommend a separate in-person follow-up with your provider.  Ongoing Care Seeing your primary care provider every 3 to 6 months helps us  monitor your health and provide consistent, personalized care.   Referrals If a referral was made during today's visit and you haven't received any updates within two weeks, please contact the referred provider directly to check on the status.  Recommended Screenings:  Health Maintenance  Topic Date Due   Hepatitis B Vaccine (1 of 3 - 19+ 3-dose series) Never done   Pneumococcal Vaccine for age over 89 (1 of 1 - PCV) Never done   Flu Shot  06/01/2024   COVID-19 Vaccine (5 - 2025-26 season) 07/02/2024   Medicare Annual Wellness Visit  07/20/2025   DTaP/Tdap/Td vaccine (3 - Td or Tdap) 12/10/2030   Colon Cancer Screening  02/06/2034   Hepatitis C Screening  Completed   Zoster (Shingles) Vaccine  Completed   HPV Vaccine  Aged Out   Meningitis B Vaccine  Aged Out   Cologuard (Stool DNA test)  Discontinued   HIV Screening  Discontinued       07/20/2024   12:57 PM  Advanced Directives  Does Patient Have a Medical Advance Directive? No  Would patient like information on creating a medical advance directive? No - Patient declined   Advance Care Planning is  important because it: Ensures you receive medical care that aligns with your values, goals, and preferences. Provides guidance to your family and loved ones, reducing the emotional burden of decision-making during critical moments.  Vision: Annual vision screenings are recommended for early detection of glaucoma, cataracts, and diabetic retinopathy. These exams can also reveal signs of chronic conditions such as diabetes and high blood pressure.  Dental: Annual dental screenings help detect early signs of oral cancer, gum disease, and other conditions linked to overall health, including heart disease and diabetes.

## 2024-07-20 NOTE — Progress Notes (Signed)
 Subjective:   Alec Campbell is a 53 y.o. who presents for a Medicare Wellness preventive visit.  As a reminder, Annual Wellness Visits don't include a physical exam, and some assessments may be limited, especially if this visit is performed virtually. We may recommend an in-person follow-up visit with your provider if needed.  Visit Complete: Virtual I connected with  Alec Campbell on 07/20/24 by a audio enabled telemedicine application and verified that I am speaking with the correct person using two identifiers.  Patient Location: Home  Provider Location: Office/Clinic  I discussed the limitations of evaluation and management by telemedicine. The patient expressed understanding and agreed to proceed.  Vital Signs: Because this visit was a virtual/telehealth visit, some criteria may be missing or patient reported. Any vitals not documented were not able to be obtained and vitals that have been documented are patient reported.  VideoDeclined- This patient declined Librarian, academic. Therefore the visit was completed with audio only.  Persons Participating in Visit: Patient assisted by Mother, Reena Pole.  AWV Questionnaire: No: Patient Medicare AWV questionnaire was not completed prior to this visit.  Cardiac Risk Factors include: advanced age (>53men, >13 women);dyslipidemia     Objective:    Today's Vitals   07/20/24 1246  Weight: 170 lb (77.1 kg)  Height: 5' 7 (1.702 m)   Body mass index is 26.63 kg/m.     07/20/2024   12:57 PM 07/14/2023    3:03 PM 03/10/2023    1:47 PM 02/19/2023    9:00 AM 02/10/2023    6:14 AM 01/31/2023    3:52 PM 04/05/2019   12:05 PM  Advanced Directives  Does Patient Have a Medical Advance Directive? No No No No No No No  Would patient like information on creating a medical advance directive? No - Patient declined  No - Patient declined No - Guardian declined;No - Patient declined No - Patient declined No - Patient  declined No - Guardian declined      Data saved with a previous flowsheet row definition    Current Medications (verified) Outpatient Encounter Medications as of 07/20/2024  Medication Sig   carbamazepine  (TEGRETOL ) 200 MG tablet TAKE TWO TABLETS BY MOUTH IN THE MORNING, TAKE ONE TABLET BY MOUTH AT LUNCH, AND TAKE ONE TABLET BY MOUTH AT NIGHT   Multiple Vitamin (MULTIVITAMIN WITH MINERALS) TABS tablet Take 1 tablet by mouth daily.   PHENobarbital  (LUMINAL) 64.8 MG tablet Take 1 tablet (64.8 mg total) by mouth 2 (two) times daily.   PREVIDENT  5000 SENSITIVE 1.1-5 % GEL Place onto teeth 2 (two) times daily.   rosuvastatin  (CRESTOR ) 10 MG tablet Take 1 tablet by mouth once daily   valACYclovir  (VALTREX ) 1000 MG tablet TAKE 1 TABLET BY MOUTH TWICE DAILY AS NEEDED FOR  OUTBREAK   No facility-administered encounter medications on file as of 07/20/2024.    Allergies (verified) Patient has no known allergies.   History: Past Medical History:  Diagnosis Date   Allergy    Family history of colon cancer 03/10/2023   Moderate mental retardation    Seizures (HCC)    Vitamin D  deficiency    Past Surgical History:  Procedure Laterality Date   ANKLE SURGERY Right    CHOLECYSTECTOMY N/A 02/10/2023   Procedure: LAPAROSCOPIC CHOLECYSTECTOMY;  Surgeon: Jordis Laneta FALCON, MD;  Location: [facility] ORS;  Service: General;  Laterality: N/A;   CLEFT LIP REPAIR     COLONOSCOPY N/A 12/30/2022   Procedure: COLONOSCOPY;  Surgeon: Therisa Bi,  MD;  Location: [facility] ENDOSCOPY;  Service: Gastroenterology;  Laterality: N/A;  SPECIAL NEEDS - MOTHER NEEDS TO ACCOMPANY HIM.   COLONOSCOPY N/A 02/07/2024   Procedure: COLONOSCOPY;  Surgeon: Therisa Bi, MD;  Location: [facility] ENDOSCOPY;  Service: Gastroenterology;  Laterality: N/A;   ESOPHAGOGASTRODUODENOSCOPY (EGD) WITH PROPOFOL  N/A 12/30/2022   Procedure: ESOPHAGOGASTRODUODENOSCOPY (EGD) WITH PROPOFOL ;  Surgeon: Therisa Bi, MD;  Location: [facility] ENDOSCOPY;  Service:  Gastroenterology;  Laterality: N/A;   HERNIA REPAIR     Inguinal   LAPAROSCOPIC RIGHT COLECTOMY N/A 02/10/2023   Procedure: LAPAROSCOPIC RIGHT COLECTOMY, hand assisted, RNFA to assist;  Surgeon: Jordis Laneta FALCON, MD;  Location: [facility] ORS;  Service: General;  Laterality: N/A;   PALATE SURGERY     POLYPECTOMY  02/07/2024   Procedure: POLYPECTOMY, INTESTINE;  Surgeon: Therisa Bi, MD;  Location: [facility] ENDOSCOPY;  Service: Gastroenterology;;   TYMPANOSTOMY TUBE PLACEMENT     Family History  Problem Relation Age of Onset   Hypertension Mother    Thyroid  disease Mother    CAD Father    Hypertension Father    Hyperlipidemia Father    Healthy Brother    Healthy Sister    Diabetes Brother    COPD Brother    Emphysema Brother    Alcohol abuse Brother    Hypertension Brother    Aneurysm Brother        brain   Social History   Socioeconomic History   Marital status: Single    Spouse name: Not on file   Number of children: 0   Years of education: Handicapped diploma   Highest education level: Not on file  Occupational History    Employer: DISABLED  Tobacco Use   Smoking status: Never    Passive exposure: Never   Smokeless tobacco: Never  Vaping Use   Vaping status: Never Used  Substance and Sexual Activity   Alcohol use: No    Alcohol/week: 0.0 standard drinks of alcohol   Drug use: No   Sexual activity: Not Currently  Other Topics Concern   Not on file  Social History Narrative   Moderate mental retardation. Parents are the primary caregiver, he lives with them   Social Drivers of Health   Financial Resource Strain: Low Risk  (07/20/2024)   Overall Financial Resource Strain (CARDIA)    Difficulty of Paying Living Expenses: Not hard at all  Food Insecurity: No Food Insecurity (07/20/2024)   Hunger Vital Sign    Worried About Running Out of Food in the Last Year: Never true    Ran Out of Food in the Last Year: Never true  Transportation Needs: No Transportation Needs  (07/20/2024)   PRAPARE - Administrator, Civil Service (Medical): No    Lack of Transportation (Non-Medical): No  Physical Activity: Inactive (07/20/2024)   Exercise Vital Sign    Days of Exercise per Week: 0 days    Minutes of Exercise per Session: 0 min  Stress: No Stress Concern Present (07/20/2024)   [employer] of Occupational Health - Occupational Stress Questionnaire    Feeling of Stress: Not at all  Social Connections: Socially Isolated (07/20/2024)   Social Connection and Isolation Panel    Frequency of Communication with Friends and Family: Twice a week    Frequency of Social Gatherings with Friends and Family: Once a week    Attends Religious Services: Never    Database administrator or Organizations: No    Attends Banker Meetings: Never  Marital Status: Never married    Tobacco Counseling Counseling given: Not Answered    Clinical Intake:  Pre-visit preparation completed: Yes  Pain : No/denies pain     BMI - recorded: 26.63 Nutritional Status: BMI 25 -29 Overweight Nutritional Risks: None Diabetes: No  Lab Results  Component Value Date   HGBA1C 5.2 09/06/2017   HGBA1C 5.0 01/19/2017   HGBA1C 5.1 07/27/2016     How often do you need to have someone help you when you read instructions, pamphlets, or other written materials from your doctor or pharmacy?: 5 - Always (Mother handles)  Interpreter Needed?: No  Information entered by :: Verdie Saba, CMA   Activities of Daily Living     07/20/2024   12:50 PM 08/23/2023   10:59 AM  In your present state of health, do you have any difficulty performing the following activities:  Hearing? 0 0  Vision? 0 0  Difficulty concentrating or making decisions? 0 1  Walking or climbing stairs? 0 0  Dressing or bathing? 0 0  Doing errands, shopping? 1 0  Comment Mother Public house manager Food and eating ? N   Using the Toilet? N   In the past six months, have you accidently  leaked urine? N   Do you have problems with loss of bowel control? N   Managing your Medications? Y   Comment Mother handles   Managing your Finances? Y   Comment Mother handles   Housekeeping or managing your Housekeeping? Y   Comment Mother handles     Patient Care Team: Sowles, Krichna, MD as PCP - General (Family Medicine) Maurie Rayfield BIRCH, RN as Oncology Nurse Navigator Agrawal, Kavita, MD as Consulting Physician (Oncology)  I have updated your Care Teams any recent Medical Services you may have received from other providers in the past year.     Assessment:   This is a routine wellness examination for [place].  Hearing/Vision screen Hearing Screening - Comments:: Denies hearing difficulties   Vision Screening - Comments:: Denies vision concerns    Goals Addressed               This Visit's Progress     Patient Stated (pt-stated)        Patient and Mother stated the goal is to continue being active (move around more each day)       Depression Screen     07/20/2024   12:51 PM 01/13/2024   10:59 AM 08/23/2023   10:59 AM 07/14/2023    3:01 PM 06/10/2023   10:32 AM 01/13/2023   11:09 AM 12/10/2022   10:57 AM  PHQ 2/9 Scores  PHQ - 2 Score 0 0 0 0 0 0 0  PHQ- 9 Score 0 0 0  0  0    Fall Risk     07/20/2024   12:55 PM 08/23/2023   10:59 AM 07/14/2023    2:59 PM 06/10/2023   10:31 AM 03/08/2023   10:29 AM  Fall Risk   Falls in the past year? 0 0 0 0 0  Number falls in past yr: 0 0 0 0   Injury with Fall? 0 0 0 0   Risk for fall due to : No Fall Risks No Fall Risks No Fall Risks No Fall Risks No Fall Risks  Follow up Falls evaluation completed;Falls prevention discussed Falls prevention discussed;Education provided;Falls evaluation completed Education provided;Falls prevention discussed Falls prevention discussed Falls prevention discussed    MEDICARE RISK AT  HOME:  Medicare Risk at Home Any stairs in or around the home?: No If so, are there any without  handrails?: No Home free of loose throw rugs in walkways, pet beds, electrical cords, etc?: Yes Adequate lighting in your home to reduce risk of falls?: Yes Life alert?: No Use of a cane, walker or w/c?: No Grab bars in the bathroom?: No Shower chair or bench in shower?: No Elevated toilet seat or a handicapped toilet?: No  TIMED UP AND GO:  Was the test performed?  No  Cognitive Function: 6CIT completed        07/20/2024   12:55 PM  6CIT Screen  What Year? 0 points  What month? 0 points  What time? 0 points  Count back from 20 0 points  Months in reverse 0 points  Repeat phrase 0 points  Total Score 0 points    Immunizations Immunization History  Administered Date(s) Administered   Influenza Split 10/06/2009, 07/21/2012   Influenza, Seasonal, Injecte, Preservative Fre 06/23/2010, 08/11/2023   Influenza,inj,Quad PF,6+ Mos 07/23/2013, 08/02/2014, 07/18/2015, 07/21/2016, 08/03/2017, 08/07/2018, 08/03/2019, 08/11/2020, 08/26/2021, 08/04/2022   Influenza-Unspecified 08/02/2014   Moderna Sars-Covid-2 Vaccination 12/05/2019, 01/03/2020, 09/02/2020, 01/18/2021   Tdap 06/23/2010, 12/10/2020   Zoster Recombinant(Shingrix) 06/09/2020, 08/11/2020    Screening Tests Health Maintenance  Topic Date Due   Hepatitis B Vaccines 19-59 Average Risk (1 of 3 - 19+ 3-dose series) Never done   Pneumococcal Vaccine: 50+ Years (1 of 1 - PCV) Never done   Influenza Vaccine  06/01/2024   COVID-19 Vaccine (5 - 2025-26 season) 07/02/2024   Medicare Annual Wellness (AWV)  07/20/2025   DTaP/Tdap/Td (3 - Td or Tdap) 12/10/2030   Colonoscopy  02/06/2034   Hepatitis C Screening  Completed   Zoster Vaccines- Shingrix  Completed   HPV VACCINES  Aged Out   Meningococcal B Vaccine  Aged Out   Fecal DNA (Cologuard)  Discontinued   HIV Screening  Discontinued    Health Maintenance Items Addressed: 07/20/2024   Additional Screening:  Vision Screening: Recommended annual ophthalmology exams for  early detection of glaucoma and other disorders of the eye. Is the patient up to date with their annual eye exam?  No  Who is the provider or what is the name of the office in which the patient attends annual eye exams? Mother states the pt has not seen an Ophthalmologist in many years but will schedule for 2026.  Dental Screening: Recommended annual dental exams for proper oral hygiene  Community Resource Referral / Chronic Care Management: CRR required this visit?  No   CCM required this visit?  No   Plan:    I have personally reviewed and noted the following in the patient's chart:   Medical and social history Use of alcohol, tobacco or illicit drugs  Current medications and supplements including opioid prescriptions. Patient is not currently taking opioid prescriptions. Functional ability and status Nutritional status Physical activity Advanced directives List of other physicians Hospitalizations, surgeries, and ER visits in previous 12 months Vitals Screenings to include cognitive, depression, and falls Referrals and appointments  In addition, I have reviewed and discussed with patient certain preventive protocols, quality metrics, and best practice recommendations. A written personalized care plan for preventive services as well as general preventive health recommendations were provided to patient.   Verdie CHRISTELLA Saba, CMA   07/20/2024   After Visit Summary: (MyChart) Due to this being a telephonic visit, the after visit summary with patients personalized plan was offered to  patient via MyChart   Notes: Nothing significant to report at this time.

## 2024-07-20 NOTE — Progress Notes (Signed)
 Subjective:   Alec Campbell is a 53 y.o. who presents for a Medicare Wellness preventive visit.  As a reminder, Annual Wellness Visits don't include a physical exam, and some assessments may be limited, especially if this visit is performed virtually. We may recommend an in-person follow-up visit with your provider if needed.  Visit Complete: Virtual I connected with  Alec Campbell on 07/20/24 by a audio enabled telemedicine application and verified that I am speaking with the correct person using two identifiers.  Patient Location: Home  Provider Location: Office/Clinic  I discussed the limitations of evaluation and management by telemedicine. The patient expressed understanding and agreed to proceed.  Vital Signs: Because this visit was a virtual/telehealth visit, some criteria may be missing or patient reported. Any vitals not documented were not able to be obtained and vitals that have been documented are patient reported.  VideoDeclined- This patient declined Librarian, academic. Therefore the visit was completed with audio only.  Persons Participating in Visit: Patient assisted by Mother, Alec Campbell.  AWV Questionnaire: No: Patient Medicare AWV questionnaire was not completed prior to this visit.  Cardiac Risk Factors include: advanced age (>19men, >31 women);dyslipidemia     Objective:    Today's Vitals   07/20/24 1246  Weight: 170 lb (77.1 kg)  Height: 5' 7 (1.702 m)   Body mass index is 26.63 kg/m.     07/20/2024   12:57 PM 07/14/2023    3:03 PM 03/10/2023    1:47 PM 02/19/2023    9:00 AM 02/10/2023    6:14 AM 01/31/2023    3:52 PM 04/05/2019   12:05 PM  Advanced Directives  Does Patient Have a Medical Advance Directive? No No No No No No No  Would patient like information on creating a medical advance directive? No - Patient declined  No - Patient declined No - Guardian declined;No - Patient declined No - Patient declined No - Patient  declined No - Guardian declined      Data saved with a previous flowsheet row definition    Current Medications (verified) Outpatient Encounter Medications as of 07/20/2024  Medication Sig   carbamazepine  (TEGRETOL ) 200 MG tablet TAKE TWO TABLETS BY MOUTH IN THE MORNING, TAKE ONE TABLET BY MOUTH AT LUNCH, AND TAKE ONE TABLET BY MOUTH AT NIGHT   Multiple Vitamin (MULTIVITAMIN WITH MINERALS) TABS tablet Take 1 tablet by mouth daily.   PHENobarbital  (LUMINAL) 64.8 MG tablet Take 1 tablet (64.8 mg total) by mouth 2 (two) times daily.   PREVIDENT  5000 SENSITIVE 1.1-5 % GEL Place onto teeth 2 (two) times daily.   rosuvastatin  (CRESTOR ) 10 MG tablet Take 1 tablet by mouth once daily   valACYclovir  (VALTREX ) 1000 MG tablet TAKE 1 TABLET BY MOUTH TWICE DAILY AS NEEDED FOR  OUTBREAK   No facility-administered encounter medications on file as of 07/20/2024.    Allergies (verified) Patient has no known allergies.   History: Past Medical History:  Diagnosis Date   Allergy    Family history of colon cancer 03/10/2023   Moderate mental retardation    Seizures (HCC)    Vitamin D  deficiency    Past Surgical History:  Procedure Laterality Date   ANKLE SURGERY Right    CHOLECYSTECTOMY N/A 02/10/2023   Procedure: LAPAROSCOPIC CHOLECYSTECTOMY;  Surgeon: Jordis Laneta FALCON, MD;  Location: [facility] ORS;  Service: General;  Laterality: N/A;   CLEFT LIP REPAIR     COLONOSCOPY N/A 12/30/2022   Procedure: COLONOSCOPY;  Surgeon: Therisa Bi,  MD;  Location: [facility] ENDOSCOPY;  Service: Gastroenterology;  Laterality: N/A;  SPECIAL NEEDS - MOTHER NEEDS TO ACCOMPANY HIM.   COLONOSCOPY N/A 02/07/2024   Procedure: COLONOSCOPY;  Surgeon: Therisa Bi, MD;  Location: [facility] ENDOSCOPY;  Service: Gastroenterology;  Laterality: N/A;   ESOPHAGOGASTRODUODENOSCOPY (EGD) WITH PROPOFOL  N/A 12/30/2022   Procedure: ESOPHAGOGASTRODUODENOSCOPY (EGD) WITH PROPOFOL ;  Surgeon: Therisa Bi, MD;  Location: [facility] ENDOSCOPY;  Service:  Gastroenterology;  Laterality: N/A;   HERNIA REPAIR     Inguinal   LAPAROSCOPIC RIGHT COLECTOMY N/A 02/10/2023   Procedure: LAPAROSCOPIC RIGHT COLECTOMY, hand assisted, RNFA to assist;  Surgeon: Jordis Laneta FALCON, MD;  Location: [facility] ORS;  Service: General;  Laterality: N/A;   PALATE SURGERY     POLYPECTOMY  02/07/2024   Procedure: POLYPECTOMY, INTESTINE;  Surgeon: Therisa Bi, MD;  Location: [facility] ENDOSCOPY;  Service: Gastroenterology;;   TYMPANOSTOMY TUBE PLACEMENT     Family History  Problem Relation Age of Onset   Hypertension Mother    Thyroid  disease Mother    CAD Father    Hypertension Father    Hyperlipidemia Father    Healthy Brother    Healthy Sister    Diabetes Brother    COPD Brother    Emphysema Brother    Alcohol abuse Brother    Hypertension Brother    Aneurysm Brother        brain   Social History   Socioeconomic History   Marital status: Single    Spouse name: Not on file   Number of children: 0   Years of education: Handicapped diploma   Highest education level: Not on file  Occupational History    Employer: DISABLED  Tobacco Use   Smoking status: Never    Passive exposure: Never   Smokeless tobacco: Never  Vaping Use   Vaping status: Never Used  Substance and Sexual Activity   Alcohol use: No    Alcohol/week: 0.0 standard drinks of alcohol   Drug use: No   Sexual activity: Not Currently  Other Topics Concern   Not on file  Social History Narrative   Moderate mental retardation. Parents are the primary caregiver, he lives with them   Social Drivers of Health   Financial Resource Strain: Low Risk  (07/20/2024)   Overall Financial Resource Strain (CARDIA)    Difficulty of Paying Living Expenses: Not hard at all  Food Insecurity: No Food Insecurity (07/20/2024)   Hunger Vital Sign    Worried About Running Out of Food in the Last Year: Never true    Ran Out of Food in the Last Year: Never true  Transportation Needs: No Transportation Needs  (07/20/2024)   PRAPARE - Administrator, Civil Service (Medical): No    Lack of Transportation (Non-Medical): No  Physical Activity: Inactive (07/20/2024)   Exercise Vital Sign    Days of Exercise per Week: 0 days    Minutes of Exercise per Session: 0 min  Stress: No Stress Concern Present (07/20/2024)   [employer] of Occupational Health - Occupational Stress Questionnaire    Feeling of Stress: Not at all  Social Connections: Socially Isolated (07/20/2024)   Social Connection and Isolation Panel    Frequency of Communication with Friends and Family: Twice a week    Frequency of Social Gatherings with Friends and Family: Once a week    Attends Religious Services: Never    Database administrator or Organizations: No    Attends Banker Meetings: Never  Marital Status: Never married    Tobacco Counseling Counseling given: Not Answered    Clinical Intake:  Pre-visit preparation completed: Yes  Pain : No/denies pain     BMI - recorded: 26.63 Nutritional Status: BMI 25 -29 Overweight Nutritional Risks: None Diabetes: No  Lab Results  Component Value Date   HGBA1C 5.2 09/06/2017   HGBA1C 5.0 01/19/2017   HGBA1C 5.1 07/27/2016     How often do you need to have someone help you when you read instructions, pamphlets, or other written materials from your doctor or pharmacy?: 5 - Always (Mother handles)  Interpreter Needed?: No  Information entered by :: Alec Campbell, CMA   Activities of Daily Living     07/20/2024   12:50 PM 08/23/2023   10:59 AM  In your present state of health, do you have any difficulty performing the following activities:  Hearing? 0 0  Vision? 0 0  Difficulty concentrating or making decisions? 0 1  Walking or climbing stairs? 0 0  Dressing or bathing? 0 0  Doing errands, shopping? 1 0  Comment Mother Public house manager Food and eating ? N   Using the Toilet? N   In the past six months, have you accidently  leaked urine? N   Do you have problems with loss of bowel control? N   Managing your Medications? Y   Comment Mother handles   Managing your Finances? Y   Comment Mother handles   Housekeeping or managing your Housekeeping? Y   Comment Mother handles     Patient Care Team: Sowles, Krichna, MD as PCP - General (Family Medicine) Maurie Rayfield BIRCH, RN as Oncology Nurse Navigator Agrawal, Kavita, MD as Consulting Physician (Oncology)  I have updated your Care Teams any recent Medical Services you may have received from other providers in the past year.     Assessment:   This is a routine wellness examination for Alec Campbell.  Hearing/Vision screen Hearing Screening - Comments:: Denies hearing difficulties   Vision Screening - Comments:: Denies vision concerns    Goals Addressed               This Visit's Progress     Patient Stated (pt-stated)        Patient and Mother stated the goal is to continue being active (move around more each day)       Depression Screen     07/20/2024   12:51 PM 01/13/2024   10:59 AM 08/23/2023   10:59 AM 07/14/2023    3:01 PM 06/10/2023   10:32 AM 01/13/2023   11:09 AM 12/10/2022   10:57 AM  PHQ 2/9 Scores  PHQ - 2 Score 0 0 0 0 0 0 0  PHQ- 9 Score 0 0 0  0  0    Fall Risk     07/20/2024   12:55 PM 08/23/2023   10:59 AM 07/14/2023    2:59 PM 06/10/2023   10:31 AM 03/08/2023   10:29 AM  Fall Risk   Falls in the past year? 0 0 0 0 0  Number falls in past yr: 0 0 0 0   Injury with Fall? 0 0 0 0   Risk for fall due to : No Fall Risks No Fall Risks No Fall Risks No Fall Risks No Fall Risks  Follow up Falls evaluation completed;Falls prevention discussed Falls prevention discussed;Education provided;Falls evaluation completed Education provided;Falls prevention discussed Falls prevention discussed Falls prevention discussed    MEDICARE RISK AT  HOME:  Medicare Risk at Home Any stairs in or around the home?: No If so, are there any without  handrails?: No Home free of loose throw rugs in walkways, pet beds, electrical cords, etc?: Yes Adequate lighting in your home to reduce risk of falls?: Yes Life alert?: No Use of a cane, walker or w/c?: No Grab bars in the bathroom?: No Shower chair or bench in shower?: No Elevated toilet seat or a handicapped toilet?: No  TIMED UP AND GO:  Was the test performed?  No  Cognitive Function: 6CIT completed    07/20/2024    3:01 PM  MMSE - Mini Mental State Exam  Not completed: Unable to complete        07/20/2024   12:55 PM  6CIT Screen  What Year? --    Immunizations Immunization History  Administered Date(s) Administered   Influenza Split 10/06/2009, 07/21/2012   Influenza, Seasonal, Injecte, Preservative Fre 06/23/2010, 08/11/2023   Influenza,inj,Quad PF,6+ Mos 07/23/2013, 08/02/2014, 07/18/2015, 07/21/2016, 08/03/2017, 08/07/2018, 08/03/2019, 08/11/2020, 08/26/2021, 08/04/2022   Influenza-Unspecified 08/02/2014   Moderna Sars-Covid-2 Vaccination 12/05/2019, 01/03/2020, 09/02/2020, 01/18/2021   Tdap 06/23/2010, 12/10/2020   Zoster Recombinant(Shingrix) 06/09/2020, 08/11/2020    Screening Tests Health Maintenance  Topic Date Due   Hepatitis B Vaccines 19-59 Average Risk (1 of 3 - 19+ 3-dose series) Never done   Pneumococcal Vaccine: 50+ Years (1 of 1 - PCV) Never done   Influenza Vaccine  06/01/2024   COVID-19 Vaccine (5 - 2025-26 season) 07/02/2024   Medicare Annual Wellness (AWV)  07/20/2025   DTaP/Tdap/Td (3 - Td or Tdap) 12/10/2030   Colonoscopy  02/06/2034   Hepatitis C Screening  Completed   Zoster Vaccines- Shingrix  Completed   HPV VACCINES  Aged Out   Meningococcal B Vaccine  Aged Out   Fecal DNA (Cologuard)  Discontinued   HIV Screening  Discontinued    Health Maintenance Items Addressed: 07/20/2024   Additional Screening:  Vision Screening: Recommended annual ophthalmology exams for early detection of glaucoma and other disorders of the eye. Is  the patient up to date with their annual eye exam?  No  Who is the provider or what is the name of the office in which the patient attends annual eye exams? Mother states the pt has not seen an Ophthalmologist in many years but will schedule for 2026.  Dental Screening: Recommended annual dental exams for proper oral hygiene  Community Resource Referral / Chronic Care Management: CRR required this visit?  No   CCM required this visit?  No   Plan:    I have personally reviewed and noted the following in the patient's chart:   Medical and social history Use of alcohol, tobacco or illicit drugs  Current medications and supplements including opioid prescriptions. Patient is not currently taking opioid prescriptions. Functional ability and status Nutritional status Physical activity Advanced directives List of other physicians Hospitalizations, surgeries, and ER visits in previous 12 months Vitals Screenings to include cognitive, depression, and falls Referrals and appointments  In addition, I have reviewed and discussed with patient certain preventive protocols, quality metrics, and best practice recommendations. A written personalized care plan for preventive services as well as general preventive health recommendations were provided to patient.   Alec CHRISTELLA Campbell, CMA   07/20/2024   After Visit Summary: (MyChart) Due to this being a telephonic visit, the after visit summary with patients personalized plan was offered to patient via MyChart   Notes: Nothing significant to report at this  time.

## 2024-08-16 NOTE — Patient Instructions (Signed)

## 2024-08-17 ENCOUNTER — Encounter: Payer: Self-pay | Admitting: Family Medicine

## 2024-08-17 ENCOUNTER — Ambulatory Visit: Payer: 59

## 2024-08-17 ENCOUNTER — Ambulatory Visit: Payer: 59 | Admitting: Family Medicine

## 2024-08-17 VITALS — BP 114/76 | HR 75 | Resp 16 | Ht 67.0 in | Wt 173.4 lb

## 2024-08-17 DIAGNOSIS — Z23 Encounter for immunization: Secondary | ICD-10-CM

## 2024-08-17 DIAGNOSIS — Z0000 Encounter for general adult medical examination without abnormal findings: Secondary | ICD-10-CM | POA: Diagnosis not present

## 2024-08-17 DIAGNOSIS — B001 Herpesviral vesicular dermatitis: Secondary | ICD-10-CM

## 2024-08-17 NOTE — Progress Notes (Signed)
 Name: Alec Campbell   MRN: [id number]    DOB: February 14, 1971   Date:08/17/2024       Progress Note  Subjective  Chief Complaint  Chief Complaint  Patient presents with   Annual Exam    HPI  Patient presents for annual CPE and follow up  Discussed the use of AI scribe software for clinical note transcription with the patient, who gave verbal consent to proceed.  History of Present Illness Alec Campbell is a 53 year old male with intellectual disability and epilepsy who presents for an annual physical exam and follow-up. He is accompanied by his mother, who is his primary caregiver.  He has a history of epilepsy with seizures beginning at 59 old. His mother manages his medications, which include phenobarbital  64.8 mg twice daily and carbamazepine  200 mg, taken as two tablets in the morning, one at lunch, and one at night. Seizure levels have been stable, with no recent seizures reported. He no longer sees a specialist for epilepsy.  In April 2024, he underwent a right hemicolectomy due to a colon mass that was positive for adenocarcinoma. A follow-up colonoscopy in April 2025 was reported as clear, with only one polyp noted.  He has a history of vitamin D  deficiency, managed with supplementation, and his last vitamin D  level was normal. He also has a history of anemia, with hemoglobin levels improving from 8.3 to 12.9 after his colectomy. His LDL cholesterol and triglycerides are slightly elevated, and he takes rosuvastatin  daily without any muscle aches.  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 He has limited range of motion in the left foot but manages activities of daily living independently, such as using the bathroom and showering.  He lives with his parents, who manage his medications and finances. He does not cook or drive and relies on his parents for transportation. He helps at home by straightening his bed and getting the  mail. He is not sexually active and has never smoked.       Diet: balanced  Exercise: discussed importance of regular activity  Last Dental Exam: up to date  Last Eye Exam: not up to date, difficulty cooperating with exam   Depression: phq 9 is negative    08/17/2024    1:21 PM 07/20/2024   12:51 PM 01/13/2024   10:59 AM 08/23/2023   10:59 AM 07/14/2023    3:01 PM  Depression screen PHQ 2/9  Decreased Interest 0 0 0 0 0  Down, Depressed, Hopeless 0 0 0 0 0  PHQ - 2 Score 0 0 0 0 0  Altered sleeping 0 0 0 0   Tired, decreased energy 0 0 0 0   Change in appetite 0 0 0 0   Feeling bad or failure about yourself  0 0 0 0   Trouble concentrating 0 0 0 0   Moving slowly or fidgety/restless 0 0 0 0   Suicidal thoughts 0 0 0 0   PHQ-9 Score 0 0 0 0   Difficult doing work/chores Not difficult at all Not difficult at all Not difficult at all Not difficult at all     Hypertension:  BP Readings from Last 3 Encounters:  08/17/24 114/76  02/07/24 (!) 94/56  01/13/24 132/80    Obesity: Wt Readings from Last 3 Encounters:  08/17/24 173 lb 6.4 oz (78.7 kg)  07/20/24 170 lb (77.1 kg)  02/07/24 170 lb (77.1 kg)  BMI Readings from Last 3 Encounters:  08/17/24 27.16 kg/m  07/20/24 26.63 kg/m  02/07/24 26.63 kg/m     Flowsheet Row Clinical Support from 07/20/2024 in [facility]  AUDIT-C Score 0     Single STD testing and prevention (HIV/chl/gon/syphilis):  no Sexual history: never sexually active  Hep C Screening: completed Skin cancer: Discussed monitoring for atypical lesions Colorectal cancer: up to date  Prostate cancer:  not applicable  Lung cancer:  Low Dose CT Chest recommended if Age 85-80 years, 30 pack-year currently smoking OR have quit w/in 15years. Patient  is not a candidate for screening   AAA: The USPSTF recommends one-time screening with ultrasonography in men ages 58 to 75 years who have ever smoked. Patient   is not a candidate  for screening  ECG:  2024  Vaccines:  reviewed with the patient.   Advanced Care Planning: A voluntary discussion about advance care planning including the explanation and discussion of advance directives.  Discussed health care proxy and Living will, and the patient was able to identify a health care proxy as parents .  Patient does not have a living will and power of attorney of health care   Patient Active Problem List   Diagnosis Date Noted   Personal history of colon cancer 03/10/2023   S/P partial resection of colon 02/10/2023   History of Helicobacter pylori infection 12/10/2022   History of anemia 12/10/2022   Inguinal hernia with gangrene, recurrent bilateral 06/17/2020   Cold sore 02/13/2016   Vitamin D  deficiency 07/20/2015   Intellectual disability with epilepsy (HCC) 07/16/2015   Seizure disorder (HCC) 07/16/2015   Seasonal allergic rhinitis 07/16/2015   Dyslipidemia 07/16/2015    Past Surgical History:  Procedure Laterality Date   ANKLE SURGERY Right    CHOLECYSTECTOMY N/A 02/10/2023   Procedure: LAPAROSCOPIC CHOLECYSTECTOMY;  Surgeon: Jordis Laneta FALCON, MD;  Location: [facility] ORS;  Service: General;  Laterality: N/A;   CLEFT LIP REPAIR     COLONOSCOPY N/A 12/30/2022   Procedure: COLONOSCOPY;  Surgeon: Therisa Bi, MD;  Location: [facility] ENDOSCOPY;  Service: Gastroenterology;  Laterality: N/A;  SPECIAL NEEDS - MOTHER NEEDS TO ACCOMPANY HIM.   COLONOSCOPY N/A 02/07/2024   Procedure: COLONOSCOPY;  Surgeon: Therisa Bi, MD;  Location[facility] ENDOSCOPY;  Service: Gastroenterology;  Laterality: N/A;   ESOPHAGOGASTRODUODENOSCOPY (EGD) WITH PROPOFOL  N/A 12/30/2022   Procedure: ESOPHAGOGASTRODUODENOSCOPY (EGD) WITH PROPOFOL ;  Surgeon: Therisa Bi, MD;  Location: [facility] ENDOSCOPY;  Service: Gastroenterology;  Laterality: N/A;   HERNIA REPAIR     Inguinal   LAPAROSCOPIC RIGHT COLECTOMY N/A 02/10/2023   Procedure: LAPAROSCOPIC RIGHT COLECTOMY, hand assisted, RNFA to assist;  Surgeon: Jordis Laneta FALCON, MD;  Location: [facility] ORS;  Service: General;  Laterality: N/A;   PALATE SURGERY     POLYPECTOMY  02/07/2024   Procedure: POLYPECTOMY, INTESTINE;  Surgeon: Therisa Bi, MD;  Location: [facility] ENDOSCOPY;  Service: Gastroenterology;;   TYMPANOSTOMY TUBE PLACEMENT      Family History  Problem Relation Age of Onset   Hypertension Mother    Thyroid  disease Mother    CAD Father    Hypertension Father    Hyperlipidemia Father    Healthy Brother    Healthy Sister    Diabetes Brother    COPD Brother    Emphysema Brother    Alcohol abuse Brother    Hypertension Brother    Aneurysm Brother        brain    Social History   Socioeconomic  History   Marital status: Single    Spouse name: Not on file   Number of children: 0   Years of education: Handicapped diploma   Highest education level: Not on file  Occupational History    Employer: DISABLED  Tobacco Use   Smoking status: Never    Passive exposure: Never   Smokeless tobacco: Never  Vaping Use   Vaping status: Never Used  Substance and Sexual Activity   Alcohol use: No    Alcohol/week: 0.0 standard drinks of alcohol   Drug use: No   Sexual activity: Not Currently  Other Topics Concern   Not on file  Social History Narrative   Moderate mental retardation. Parents are the primary caregiver, he lives with them   Social Drivers of Health   Financial Resource Strain: Low Risk  (07/20/2024)   Overall Financial Resource Strain (CARDIA)    Difficulty of Paying Living Expenses: Not hard at all  Food Insecurity: No Food Insecurity (07/20/2024)   Hunger Vital Sign    Worried About Running Out of Food in the Last Year: Never true    Ran Out of Food in the Last Year: Never true  Transportation Needs: No Transportation Needs (07/20/2024)   PRAPARE - Administrator, Civil Service (Medical): No    Lack of Transportation (Non-Medical): No  Physical Activity: Inactive (07/20/2024)   Exercise Vital Sign    Days of Exercise  per Week: 0 days    Minutes of Exercise per Session: 0 min  Stress: No Stress Concern Present (07/20/2024)   [employer] of Occupational Health - Occupational Stress Questionnaire    Feeling of Stress: Not at all  Social Connections: Socially Isolated (07/20/2024)   Social Connection and Isolation Panel    Frequency of Communication with Friends and Family: Twice a week    Frequency of Social Gatherings with Friends and Family: Once a week    Attends Religious Services: Never    Database administrator or Organizations: No    Attends Banker Meetings: Never    Marital Status: Never married  Intimate Partner Violence: Not At Risk (07/20/2024)   Humiliation, Afraid, Rape, and Kick questionnaire    Fear of Current or Ex-Partner: No    Emotionally Abused: No    Physically Abused: No    Sexually Abused: No     Current Outpatient Medications:    carbamazepine  (TEGRETOL ) 200 MG tablet, TAKE TWO TABLETS BY MOUTH IN THE MORNING, TAKE ONE TABLET BY MOUTH AT LUNCH, AND TAKE ONE TABLET BY MOUTH AT NIGHT, Disp: 360 tablet, Rfl: 0   Multiple Vitamin (MULTIVITAMIN WITH MINERALS) TABS tablet, Take 1 tablet by mouth daily., Disp: , Rfl:    PHENobarbital  (LUMINAL) 64.8 MG tablet, Take 1 tablet (64.8 mg total) by mouth 2 (two) times daily., Disp: 180 tablet, Rfl: 1   PREVIDENT  5000 SENSITIVE 1.1-5 % GEL, Place onto teeth 2 (two) times daily., Disp: , Rfl:    rosuvastatin  (CRESTOR ) 10 MG tablet, Take 1 tablet by mouth once daily, Disp: 90 tablet, Rfl: 1   valACYclovir  (VALTREX ) 1000 MG tablet, TAKE 1 TABLET BY MOUTH TWICE DAILY AS NEEDED FOR  OUTBREAK, Disp: 30 tablet, Rfl: 0  No Known Allergies   ROS  Ten systems reviewed and is negative except as mentioned in HPI     Objective  Vitals:   08/17/24 1326  BP: 114/76  Pulse: 75  Resp: 16  SpO2: 98%  Weight: 173 lb 6.4 oz (  78.7 kg)  Height: 5' 7 (1.702 m)    Body mass index is 27.16 kg/m.  Physical  Exam  Constitutional: Patient appears well-developed and well-nourished. No distress.  HENT: Head: Normocephalic and atraumatic. Ears: B TMs ok, no erythema or effusion; Nose: Nose normal. Mouth/Throat: Oropharynx is clear and moist. No oropharyngeal exudate.  Eyes: Conjunctivae and EOM are normal. Pupils are equal, round, and reactive to light. No scleral icterus.  Neck: Normal range of motion. Neck supple. No JVD present. No thyromegaly present.  Cardiovascular: Normal rate, regular rhythm and normal heart sounds.  No murmur heard. No BLE edema. Pulmonary/Chest: Effort normal and breath sounds normal. No respiratory distress. Abdominal: Soft. Bowel sounds are normal, no distension. There is no tenderness. no masses MALE GENITALIA: Normal descended testes bilaterally, no masses palpated, no hernias, no lesions, no discharge RECTAL: not done  Musculoskeletal: Normal range of motion, no joint effusions. No gross deformities Neurological: he is alert and oriented to person, place, and time. No cranial nerve deficit. Coordination, balance, strength, speech and gait are normal.  Skin: Skin is warm and dry. No rash noted. No erythema.  Psychiatric: Patient has a normal mood and affect. behavior is normal. Judgment and thought content normal.      Assessment & Plan Adult Wellness Visit Routine adult wellness visit with no new concerns. - Encourage regular exercise, such as walking.  Epilepsy and intellectual disability Epilepsy well-controlled with phenobarbital  and carbamazepine . Intellectual disability managed with family support. Guardianship and legal planning discussed with family. - Continue phenobarbital  64.8 mg twice daily. - Continue carbamazepine  200 mg: two tablets in the morning, one at lunch, and one at night.  Colon cancer, status post right hemicolectomy, under surveillance Status post right hemicolectomy in April 2024 for colon cancer. Recent colonoscopy in April 2025 showed  one thyroid  polyp, otherwise clear. - Schedule surveillance colonoscopy in three years.  Anemia, post-surgical Post-surgical anemia following colectomy. Hemoglobin improved from 8.3 to 12.9, approaching normal range of 13.2. - Recheck hemoglobin at next visit in March.  Hyperlipidemia Slightly elevated LDL cholesterol and triglycerides managed with rosuvastatin . No reported muscle aches or side effects. - Continue rosuvastatin  as prescribed. - Monitor lipid levels as needed.  Vitamin D  deficiency Vitamin D  deficiency managed with supplementation. Recent levels within normal range. - Continue current vitamin D  supplementation.  Left foot stiffness, status post vertical talus repair Left foot stiffness due to previous vertical talus repair with limited range of motion.  Left inguinal hernia Large left inguinal hernia with no current symptoms or complications. - Monitor for symptoms such as pain or redness. - Consider elective surgery if symptoms develop.  Right testicular retraction into inguinal canal Right testicle retracts into inguinal canal with no current issues.  Fever blisters (herpes labialis) Fever blisters managed with Valtrex  as needed. Prescription may be expired. - Renew Valtrex  prescription for fever blisters.  Immunization management Due for flu and pneumonia vaccinations. Discussed RSV vaccination with his 34 year old healthy mother. - Administer flu and pneumonia vaccines today. - Discuss RSV vaccination with his mother and recommend she visit a pharmacy for assessment.      -Prostate cancer screening and PSA options (with potential risks and benefits of testing vs not testing) were discussed along with recent recs/guidelines. -USPSTF grade A and B recommendations reviewed with patient; age-appropriate recommendations, preventive care, screening tests, etc discussed and encouraged; healthy living encouraged; see AVS for patient education given to  patient -Discussed importance of 150 minutes of physical activity weekly, eat two  servings of fish weekly, eat one serving of tree nuts ( cashews, pistachios, pecans, almonds.SABRA) every other day, eat 6 servings of fruit/vegetables daily and drink plenty of water  and avoid sweet beverages.  -Reviewed Health Maintenance: not applicable

## 2024-08-25 ENCOUNTER — Other Ambulatory Visit: Payer: Self-pay | Admitting: Family Medicine

## 2024-08-25 DIAGNOSIS — E785 Hyperlipidemia, unspecified: Secondary | ICD-10-CM

## 2024-08-31 ENCOUNTER — Ambulatory Visit (INDEPENDENT_AMBULATORY_CARE_PROVIDER_SITE_OTHER)

## 2024-08-31 DIAGNOSIS — Z23 Encounter for immunization: Secondary | ICD-10-CM

## 2024-09-08 ENCOUNTER — Other Ambulatory Visit: Payer: Self-pay | Admitting: Family Medicine

## 2024-09-08 DIAGNOSIS — G40909 Epilepsy, unspecified, not intractable, without status epilepticus: Secondary | ICD-10-CM

## 2024-09-08 DIAGNOSIS — F79 Unspecified intellectual disabilities: Secondary | ICD-10-CM

## 2024-09-10 ENCOUNTER — Other Ambulatory Visit: Payer: Self-pay | Admitting: Family Medicine

## 2024-09-10 DIAGNOSIS — F79 Unspecified intellectual disabilities: Secondary | ICD-10-CM

## 2024-09-10 DIAGNOSIS — G40909 Epilepsy, unspecified, not intractable, without status epilepticus: Secondary | ICD-10-CM

## 2024-09-12 NOTE — Telephone Encounter (Signed)
 Requested medication (s) are due for refill today: yes  Requested medication (s) are on the active medication list: yes  Last refill:  01/13/24  Future visit scheduled: yes  Notes to clinic:  Unable to refill per protocol, cannot delegate.      Requested Prescriptions  Pending Prescriptions Disp Refills   PHENobarbital  (LUMINAL) 64.8 MG tablet [Pharmacy Med Name: PHENobarbital  64.8 MG Oral Tablet] 180 tablet 0    Sig: Take 1 tablet by mouth twice daily     Not Delegated - Neurology: Anticonvulsants - Controlled - phenobarbital  Failed - 09/12/2024 11:41 AM      Failed - This refill cannot be delegated      Failed - HGB in normal range and within 360 days    Hemoglobin  Date Value Ref Range Status  01/13/2024 12.9 (L) 13.2 - 17.1 g/dL Final  90/73/7982 86.9 12.6 - 17.7 g/dL Final         Passed - Phenobarbital  in normal range and within 360 days    Phenobarbital , Serum  Date Value Ref Range Status  01/13/2024 23.8 15.0 - 40.0 mg/L Final         Passed - ALT in normal range and within 360 days    ALT  Date Value Ref Range Status  01/13/2024 12 9 - 46 U/L Final         Passed - AST in normal range and within 360 days    AST  Date Value Ref Range Status  01/13/2024 15 10 - 35 U/L Final         Passed - Cr in normal range and within 360 days    Creat  Date Value Ref Range Status  01/13/2024 0.87 0.70 - 1.30 mg/dL Final         Passed - HCT in normal range and within 360 days    HCT  Date Value Ref Range Status  01/13/2024 38.7 38.5 - 50.0 % Final   Hematocrit  Date Value Ref Range Status  07/27/2016 37.5 37.5 - 51.0 % Final         Passed - WBC in normal range and within 360 days    WBC  Date Value Ref Range Status  01/13/2024 7.6 3.8 - 10.8 Thousand/uL Final         Passed - PLT in normal range and within 360 days    Platelets  Date Value Ref Range Status  01/13/2024 157 140 - 400 Thousand/uL Final  07/27/2016 183 150 - 379 x10E3/uL Final          Passed - Completed PHQ-2 or PHQ-9 in the last 360 days      Passed - Patient is not pregnant      Passed - Valid encounter within last 12 months    Recent Outpatient Visits           3 weeks ago Well adult exam   [facility] Glenard Mire, MD   8 months ago Seizure disorder [facility])   [facility] Sowles, Krichna, MD

## 2024-11-23 ENCOUNTER — Other Ambulatory Visit: Payer: Self-pay | Admitting: Family Medicine

## 2024-11-23 DIAGNOSIS — E785 Hyperlipidemia, unspecified: Secondary | ICD-10-CM

## 2024-11-23 NOTE — Telephone Encounter (Signed)
 Requested Prescriptions  Pending Prescriptions Disp Refills   rosuvastatin  (CRESTOR ) 10 MG tablet [Pharmacy Med Name: Rosuvastatin  Calcium  10 MG Oral Tablet] 90 tablet 0    Sig: Take 1 tablet by mouth once daily     Cardiovascular:  Antilipid - Statins 2 Failed - 11/23/2024 11:59 AM      Failed - Lipid Panel in normal range within the last 12 months    Cholesterol, Total  Date Value Ref Range Status  07/27/2016 214 (H) 100 - 199 mg/dL Final   Cholesterol  Date Value Ref Range Status  01/13/2024 187 <200 mg/dL Final   LDL Cholesterol (Calc)  Date Value Ref Range Status  01/13/2024 105 (H) mg/dL (calc) Final    Comment:    Reference range: <100 . Desirable range <100 mg/dL for primary prevention;   <70 mg/dL for patients with CHD or diabetic patients  with > or = 2 CHD risk factors. SABRA LDL-C is now calculated using the Martin-Hopkins  calculation, which is a validated novel method providing  better accuracy than the Friedewald equation in the  estimation of LDL-C.  Gladis APPLETHWAITE et al. SANDREA. 7986;689(80): 2061-2068  (http://education.[facility].com/faq/FAQ164)    HDL  Date Value Ref Range Status  01/13/2024 46 > OR = 40 mg/dL Final  90/73/7982 42 >60 mg/dL Final   Triglycerides  Date Value Ref Range Status  01/13/2024 253 (H) <150 mg/dL Final    Comment:    . If a non-fasting specimen was collected, consider repeat triglyceride testing on a fasting specimen if clinically indicated.  Veatrice et al. J. of Clin. Lipidol. 2015;9:129-169. SABRA          Passed - Cr in normal range and within 360 days    Creat  Date Value Ref Range Status  01/13/2024 0.87 0.70 - 1.30 mg/dL Final         Passed - Patient is not pregnant      Passed - Valid encounter within last 12 months    Recent Outpatient Visits           3 months ago Well adult exam   [facility] Glenard Mire, MD   10 months ago Seizure disorder [facility])   [facility] Sowles, Krichna, MD

## 2025-02-15 ENCOUNTER — Ambulatory Visit: Admitting: Family Medicine

## 2025-08-16 ENCOUNTER — Ambulatory Visit

## 2025-08-22 ENCOUNTER — Encounter: Admitting: Family Medicine
